# Patient Record
Sex: Female | Born: 1975 | Race: Black or African American | Hispanic: No | Marital: Single | Smoking: Never smoker
Health system: Southern US, Community
[De-identification: ages and names within clinical notes are randomized; demographics above are authoritative.]

## PROBLEM LIST (undated history)

## (undated) DIAGNOSIS — Z9889 Other specified postprocedural states: Secondary | ICD-10-CM

## (undated) DIAGNOSIS — I10 Essential (primary) hypertension: Secondary | ICD-10-CM

## (undated) DIAGNOSIS — M1990 Unspecified osteoarthritis, unspecified site: Secondary | ICD-10-CM

## (undated) DIAGNOSIS — E785 Hyperlipidemia, unspecified: Secondary | ICD-10-CM

## (undated) DIAGNOSIS — E119 Type 2 diabetes mellitus without complications: Secondary | ICD-10-CM

## (undated) DIAGNOSIS — I4729 Other ventricular tachycardia: Secondary | ICD-10-CM

## (undated) DIAGNOSIS — I493 Ventricular premature depolarization: Secondary | ICD-10-CM

## (undated) DIAGNOSIS — I5030 Unspecified diastolic (congestive) heart failure: Secondary | ICD-10-CM

## (undated) DIAGNOSIS — R51 Headache: Secondary | ICD-10-CM

## (undated) DIAGNOSIS — R0609 Other forms of dyspnea: Secondary | ICD-10-CM

## (undated) DIAGNOSIS — R0600 Dyspnea, unspecified: Secondary | ICD-10-CM

## (undated) DIAGNOSIS — I509 Heart failure, unspecified: Secondary | ICD-10-CM

## (undated) DIAGNOSIS — R011 Cardiac murmur, unspecified: Secondary | ICD-10-CM

## (undated) HISTORY — DX: Other forms of dyspnea: R06.09

## (undated) HISTORY — DX: Unspecified diastolic (congestive) heart failure: I50.30

## (undated) HISTORY — DX: Type 2 diabetes mellitus without complications: E11.9

## (undated) HISTORY — DX: Morbid (severe) obesity due to excess calories: E66.01

## (undated) HISTORY — PX: TUBAL LIGATION: SHX77

## (undated) HISTORY — PX: OTHER SURGICAL HISTORY: SHX169

## (undated) HISTORY — DX: Other ventricular tachycardia: I47.29

## (undated) HISTORY — DX: Heart failure, unspecified: I50.9

## (undated) HISTORY — DX: Other specified postprocedural states: Z98.890

## (undated) HISTORY — DX: Dyspnea, unspecified: R06.00

## (undated) HISTORY — DX: Ventricular premature depolarization: I49.3

---

## 2002-07-02 ENCOUNTER — Other Ambulatory Visit: Admission: RE | Admit: 2002-07-02 | Discharge: 2002-07-02 | Payer: Self-pay | Admitting: Obstetrics and Gynecology

## 2003-10-01 ENCOUNTER — Other Ambulatory Visit: Admission: RE | Admit: 2003-10-01 | Discharge: 2003-10-01 | Payer: Self-pay | Admitting: Obstetrics and Gynecology

## 2004-06-08 ENCOUNTER — Observation Stay (HOSPITAL_COMMUNITY): Admission: RE | Admit: 2004-06-08 | Discharge: 2004-06-08 | Payer: Self-pay | Admitting: Obstetrics and Gynecology

## 2004-07-13 ENCOUNTER — Encounter: Admission: RE | Admit: 2004-07-13 | Discharge: 2004-07-13 | Payer: Self-pay | Admitting: Nephrology

## 2004-07-25 ENCOUNTER — Inpatient Hospital Stay (HOSPITAL_COMMUNITY): Admission: AD | Admit: 2004-07-25 | Discharge: 2004-07-25 | Payer: Self-pay | Admitting: *Deleted

## 2004-10-21 ENCOUNTER — Inpatient Hospital Stay (HOSPITAL_COMMUNITY): Admission: AD | Admit: 2004-10-21 | Discharge: 2004-10-26 | Payer: Self-pay | Admitting: Obstetrics and Gynecology

## 2004-11-09 ENCOUNTER — Inpatient Hospital Stay (HOSPITAL_COMMUNITY): Admission: AD | Admit: 2004-11-09 | Discharge: 2004-11-09 | Payer: Self-pay | Admitting: *Deleted

## 2004-11-15 ENCOUNTER — Inpatient Hospital Stay (HOSPITAL_COMMUNITY): Admission: AD | Admit: 2004-11-15 | Discharge: 2004-11-15 | Payer: Self-pay | Admitting: Obstetrics and Gynecology

## 2004-11-24 ENCOUNTER — Inpatient Hospital Stay (HOSPITAL_COMMUNITY): Admission: AD | Admit: 2004-11-24 | Discharge: 2004-11-26 | Payer: Self-pay | Admitting: Obstetrics and Gynecology

## 2009-02-22 ENCOUNTER — Ambulatory Visit: Payer: Self-pay | Admitting: Internal Medicine

## 2009-03-27 ENCOUNTER — Emergency Department: Payer: Self-pay | Admitting: Emergency Medicine

## 2010-07-13 ENCOUNTER — Ambulatory Visit: Payer: Self-pay | Admitting: Gastroenterology

## 2010-07-15 LAB — PATHOLOGY REPORT

## 2011-04-27 ENCOUNTER — Other Ambulatory Visit: Payer: Self-pay

## 2011-04-28 ENCOUNTER — Other Ambulatory Visit: Payer: Self-pay | Admitting: Obstetrics and Gynecology

## 2011-04-28 ENCOUNTER — Other Ambulatory Visit (HOSPITAL_COMMUNITY): Payer: Self-pay | Admitting: Obstetrics and Gynecology

## 2011-04-28 DIAGNOSIS — Z3689 Encounter for other specified antenatal screening: Secondary | ICD-10-CM

## 2011-04-28 DIAGNOSIS — O09529 Supervision of elderly multigravida, unspecified trimester: Secondary | ICD-10-CM

## 2011-04-28 DIAGNOSIS — Z3682 Encounter for antenatal screening for nuchal translucency: Secondary | ICD-10-CM

## 2011-05-08 ENCOUNTER — Other Ambulatory Visit: Payer: Self-pay | Admitting: Obstetrics and Gynecology

## 2011-05-22 ENCOUNTER — Encounter (HOSPITAL_COMMUNITY): Payer: Self-pay

## 2011-05-22 ENCOUNTER — Ambulatory Visit (HOSPITAL_COMMUNITY)
Admission: RE | Admit: 2011-05-22 | Discharge: 2011-05-22 | Disposition: A | Discharge status: Home / Self Care | Payer: BC Managed Care – PPO | Source: Ambulatory Visit | Attending: Obstetrics and Gynecology | Admitting: Obstetrics and Gynecology

## 2011-05-22 ENCOUNTER — Encounter (HOSPITAL_COMMUNITY)
Admission: RE | Admit: 2011-05-22 | Discharge: 2011-05-22 | Disposition: A | Discharge status: Home / Self Care | Payer: BC Managed Care – PPO | Source: Ambulatory Visit | Attending: Obstetrics & Gynecology | Admitting: Obstetrics & Gynecology

## 2011-05-22 ENCOUNTER — Other Ambulatory Visit (HOSPITAL_COMMUNITY): Payer: BC Managed Care – PPO

## 2011-05-22 DIAGNOSIS — O3510X0 Maternal care for (suspected) chromosomal abnormality in fetus, unspecified, not applicable or unspecified: Secondary | ICD-10-CM | POA: Insufficient documentation

## 2011-05-22 DIAGNOSIS — Z3682 Encounter for antenatal screening for nuchal translucency: Secondary | ICD-10-CM

## 2011-05-22 DIAGNOSIS — O09529 Supervision of elderly multigravida, unspecified trimester: Secondary | ICD-10-CM | POA: Insufficient documentation

## 2011-05-22 DIAGNOSIS — O10019 Pre-existing essential hypertension complicating pregnancy, unspecified trimester: Secondary | ICD-10-CM | POA: Insufficient documentation

## 2011-05-22 DIAGNOSIS — O351XX0 Maternal care for (suspected) chromosomal abnormality in fetus, not applicable or unspecified: Secondary | ICD-10-CM | POA: Insufficient documentation

## 2011-05-22 DIAGNOSIS — E669 Obesity, unspecified: Secondary | ICD-10-CM | POA: Insufficient documentation

## 2011-05-22 DIAGNOSIS — O99210 Obesity complicating pregnancy, unspecified trimester: Secondary | ICD-10-CM | POA: Insufficient documentation

## 2011-05-22 HISTORY — DX: Hyperlipidemia, unspecified: E78.5

## 2011-05-22 HISTORY — DX: Essential (primary) hypertension: I10

## 2011-05-22 HISTORY — DX: Headache: R51

## 2011-05-22 HISTORY — DX: Cardiac murmur, unspecified: R01.1

## 2011-05-22 HISTORY — DX: Unspecified osteoarthritis, unspecified site: M19.90

## 2011-05-22 LAB — BASIC METABOLIC PANEL
BUN: 9 mg/dL (ref 6–23)
CO2: 25 mEq/L (ref 19–32)
Calcium: 9.1 mg/dL (ref 8.4–10.5)
Chloride: 106 mEq/L (ref 96–112)
Creatinine, Ser: 0.69 mg/dL (ref 0.50–1.10)
GFR calc Af Amer: >90 mL/min (ref >90)
GFR calc non Af Amer: >90 mL/min (ref >90)
Glucose, Bld: 109 mg/dL — ABNORMAL HIGH (ref 70–99)
Potassium: 3.1 mEq/L — ABNORMAL LOW (ref 3.5–5.1)
Sodium: 138 mEq/L (ref 135–145)

## 2011-05-22 LAB — CBC
HCT: 35.1 % — ABNORMAL LOW (ref 36.0–46.0)
Hemoglobin: 11.5 g/dL — ABNORMAL LOW (ref 12.0–15.0)
MCH: 28 pg (ref 26.0–34.0)
MCHC: 32.8 g/dL (ref 30.0–36.0)
MCV: 85.6 fL (ref 78.0–100.0)
Platelets: 323 10*3/uL (ref 150–400)
RBC: 4.1 MIL/uL (ref 3.87–5.11)
RDW: 15.2 % (ref 11.5–15.5)
WBC: 10.5 10*3/uL (ref 4.0–10.5)

## 2011-05-22 NOTE — Pre-Procedure Instructions (Signed)
Reviewed Patient's BP today with Dr Arby Barrette.  Reminded patient to take Labetalol as prescribed now thru surgical date.  Patient instructed to take Labetalol and Claritin DOS with small sip of water.  Patient verbalized understanding.

## 2011-05-22 NOTE — Progress Notes (Signed)
Genetic Counseling  High-Risk Gestation Note  Appointment Date:  05/22/2011 Referred By: Philip Aspen, DO Date of Birth:  11-02-1975 Partner:  Heather Norman Attending: Particia Nearing, MD   Ms. Heather Norman and her partner, Mr. Heather Norman, were seen for genetic counseling because of a maternal age of 35 y.o. at delivery.      They were counseled regarding maternal age and the association with risk for chromosome conditions due to nondisjunction with aging of the ova.   We reviewed chromosomes, nondisjunction, and the associated 1 in 31 risk for fetal aneuploidy in the first trimester related to a maternal age of 57 at delivery.  They were counseled that the risk for aneuploidy decreases as gestational age increases, accounting for those pregnancies which spontaneously abort.  We specifically discussed Down syndrome (trisomy 54), trisomies 48 and 3, and sex chromosome aneuploidies (47,XXX and 47,XXY) including the common features and prognoses of each.   We reviewed available screening and diagnostic options.  Regarding screening tests, we discussed the options of First screen, Quad screen and ultrasound.  They understand that screening tests are used to modify a patient's a priori risk for aneuploidy, typically based on age.  This estimate provides a pregnancy specific risk assessment.  We also reviewed the availability of diagnostic options including CVS and amniocentesis.  A risk of 1 in 100 was given for CVS and 1 in 200-300 was given for amniocentesis, the primary complication being spontaneous pregnancy loss. We discussed the risks, limitations, and benefits of each.  After reviewing these options, Ms. Heather Norman elected to have First trimester screening, but declined CVS and amniocentesis at this time.  Ultrasound was performed at the time of today's visit. Complete ultrasound results are reported separately. She wishes to pursue these options to help ascertain her pregnancy  specific risks for aneuploidy and will possibly consider amniocentesis pending the results of this testing.  She understands that ultrasound cannot rule out all birth defects or genetic syndromes. The patient was advised of this limitation and states she still does not want diagnostic testing at this time.  However, they were counseled that 50-80% of fetuses with Down syndrome and up to 90% of fetuses with trisomies 13 and 18, when well visualized, have detectable anomalies or soft markers by ultrasound.   Ms. Heather Norman was provided with written information regarding sickle cell anemia (SCA) including the carrier frequency and incidence in the African-American population, the availability of carrier testing and prenatal diagnosis if indicated.  In addition, we discussed that hemoglobinopathies are routinely screened for as part of the [place] newborn screening panel.  She reported that she was screened in a past pregnancy and was negative for sickle cell trait, thus declining hemoglobin electrophoresis today.   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 She reported that it was attributed to her mother having measles during her pregnancy with her. We discussed that hearing loss can be genetic, environmental, or multifactorial. Prenatal exposures and infections can be associated with hearing loss. If this were the cause of Heather Norman's hearing loss, then recurrence risk for her children would not be increased. However, if it were due to a different underlying cause, recurrence risk could be increased depending on the cause. We reviewed that hearing is assessed as part of newborn screening for infants in [place]. The family history was otherwise noncontributory for birth defects, mental retardation, and known genetic  conditions. Without further information regarding the provided family history, an accurate genetic risk cannot be calculated.  Further genetic counseling is warranted if more information is obtained.  Heather Norman denied exposure to environmental toxins. She denied the use of alcohol, tobacco or street drugs. She denied significant viral illnesses during the course of her pregnancy. Her medical and surgical histories were contributory for hypertension, for which she is managed by her OB and treated with labetalol.    I counseled this couple regarding the above risks and available options.  The approximate face-to-face time with the genetic counselor was 20 minutes.  Clydie Braun , MS, [facility] 05/22/2011

## 2011-05-22 NOTE — Patient Instructions (Signed)
   Your procedure is scheduled on:  Thursday, November 1st  Enter through the [employer] [facility] at:  6:00am Pick up the phone at the desk and dial [phone] and inform us of your arrival.  Please call this number if you have any problems the morning of surgery: [phone]  Remember: Do not eat food after midnight:  Wednesday Do not drink clear liquids after:  Wednesday Take these medicines the morning of surgery with a SIP OF WATER:  Labetalol and claritin  Do not wear jewelry, make-up, or FINGER nail polish Do not wear lotions, powders, or perfumes.  You may wear deodorant. Do not shave 48 hours prior to surgery. Do not bring valuables to the hospital.  Patients discharged on the day of surgery will not be allowed to drive home.   Name and phone number of your driver:  sister  Nayara Taplin  cell [phone]  Remember to use your hibiclens as instructed.Please shower with 1/2 bottle the evening before your surgery and the other 1/2 bottle the morning of surgery.

## 2011-05-29 ENCOUNTER — Other Ambulatory Visit: Payer: Self-pay

## 2011-05-29 ENCOUNTER — Inpatient Hospital Stay (HOSPITAL_COMMUNITY)
Admission: AD | Admit: 2011-05-29 | Discharge: 2011-05-29 | Disposition: A | Discharge status: Home / Self Care | Payer: BC Managed Care – PPO | Source: Ambulatory Visit | Attending: Obstetrics and Gynecology | Admitting: Obstetrics and Gynecology

## 2011-05-29 ENCOUNTER — Inpatient Hospital Stay (HOSPITAL_COMMUNITY): Payer: BC Managed Care – PPO

## 2011-05-29 DIAGNOSIS — O99891 Other specified diseases and conditions complicating pregnancy: Secondary | ICD-10-CM | POA: Insufficient documentation

## 2011-05-29 DIAGNOSIS — O9989 Other specified diseases and conditions complicating pregnancy, childbirth and the puerperium: Secondary | ICD-10-CM

## 2011-05-29 DIAGNOSIS — N949 Unspecified condition associated with female genital organs and menstrual cycle: Secondary | ICD-10-CM | POA: Insufficient documentation

## 2011-05-29 DIAGNOSIS — O09299 Supervision of pregnancy with other poor reproductive or obstetric history, unspecified trimester: Secondary | ICD-10-CM

## 2011-05-29 DIAGNOSIS — O26899 Other specified pregnancy related conditions, unspecified trimester: Secondary | ICD-10-CM

## 2011-05-29 DIAGNOSIS — R109 Unspecified abdominal pain: Secondary | ICD-10-CM | POA: Insufficient documentation

## 2011-05-29 LAB — URINALYSIS, ROUTINE W REFLEX MICROSCOPIC
Bilirubin Urine: NEGATIVE
Glucose, UA: NEGATIVE mg/dL
Ketones, ur: 40 mg/dL — AB
Nitrite: NEGATIVE
Protein, ur: NEGATIVE mg/dL
Specific Gravity, Urine: 1.02 (ref 1.005–1.030)
Urobilinogen, UA: 0.2 mg/dL (ref 0.0–1.0)
pH: 7 (ref 5.0–8.0)

## 2011-05-29 LAB — URINE MICROSCOPIC-ADD ON

## 2011-05-29 NOTE — Progress Notes (Signed)
Patient states she started having lower abdominal cramping this am. No bleeding or vaginal discharge. Urinary frequency today. Scheduled for cerclage on 11-1 here at [facility] by Dr. Claiborne Billings.

## 2011-05-29 NOTE — Progress Notes (Signed)
Pt states she is having  Intermittent lower abdominal cramping that started this am. No vaginal bleeding or discharge. Pt states she is to have a cerclage on Thursday this week, b/c of hx of incompetent cervix with her other pregnancies.

## 2011-05-29 NOTE — ED Provider Notes (Signed)
History     Chief Complaint  Patient presents with  . Abdominal Pain   HPITamieka S Tsui 35 y.o. [redacted]w[redacted]d patient of Dr. Delray Alt presents with abdominal pain that began at 9 this am.   G5 P1 1 2 2.  Hx of incompetent cervix, deliveries at 16wks, 28wks, 34wks.  Has cerclage placement scheduled for Thursday.  Rates pain as 7-8/10, intermittent, dull.  Denies vaginal bleeding or vaginal discharge.  Last intercourse 2 months ago. Denies straining, injury.  Is concerned because same sxs as the pregnancy that delivered 6 weeks early.        Past Medical History  Diagnosis Date  . Hypertension   . Heart murmur   . Hyperlipidemia     no meds since pregnancy  . Shortness of breath     with exercise  . Headache     otc med prn  . Arthritis     kness/hands - no meds  . Obesity     Past Surgical History  Procedure Date  . Cervical cerclage     x 2  . Svd     x 2  . Endosocpy   . Tab     x 1    No family history on file.  History  Substance Use Topics  . Smoking status: Never Smoker   . Smokeless tobacco: Never Used  . Alcohol Use: No    Allergies:  Allergies  Allergen Reactions  . Penicillins Anaphylaxis    Prescriptions prior to admission  Medication Sig Dispense Refill  . acetaminophen (TYLENOL) 500 MG tablet Take 1,000 mg by mouth daily as needed. Pain/headache      . cetirizine (ZYRTEC) 10 MG tablet Take 10 mg by mouth daily.        Marland Kitchen labetalol (NORMODYNE) 200 MG tablet Take 300 mg by mouth 2 (two) times daily.       . prenatal vitamin w/FE, FA (PRENATAL 1 + 1) 27-1 MG TABS Take 1 tablet by mouth daily.        . sodium chloride (OCEAN) 0.65 % nasal spray Place 1 spray into the nose as needed. congestion       . loratadine (CLARITIN) 10 MG tablet Take 10 mg by mouth daily.          Review of Systems  Gastrointestinal: Positive for abdominal pain. Negative for nausea and vomiting.  Genitourinary:       Lower back, pelvic and buttock pain. Neg for vaginal  bleeding and discharge.  Neurological: Positive for headaches ("in my eyes" forgot to take allergy medication today.).   Physical Exam   Blood pressure 153/92, pulse 77, temperature 99 F (37.2 C), temperature source Oral, resp. rate 20, height 5\' 5"  (1.651 m), weight 274 lb 6.4 oz (124.467 kg), last menstrual period 02/25/2011, SpO2 99.00%.  Physical Exam  Constitutional: She is oriented to person, place, and time. She appears well-developed and well-nourished. No distress.  HENT:  Head: Normocephalic.  Neck: Normal range of motion.  Respiratory: Effort normal.  GI: Soft. She exhibits no distension and no mass. There is no tenderness. There is no rebound and no guarding.  Genitourinary: Uterus is enlarged (13 week size nontender). Cervix exhibits no motion tenderness, no discharge and no friability. Right adnexum displays no tenderness. Left adnexum displays no tenderness. No bleeding around the vagina. Vaginal discharge (small amount of white creamy discharge without odor) found.       Cervix--external os fingertip dilated.  Internal os is closed.  Neurological: She is alert and oriented to person, place, and time.  Skin: Skin is warm and dry.    ULTRASOUND REPORT:  Single live IUP in cephalic presentation.  [redacted]w[redacted]d.  Cervix long/closed by TV technique.  TOCO- contractions not seen.  MAU Course  Procedures  MDM 14:00  Reported MSE to Dr. Claiborne Billings.  She gave orders for NP to examine, monitor for contractions, U/S for cervical length, FHTs.    15:46 Called to Report physical exam, toco, and U/S findings to Dr. Claiborne Billings, went to voice mail, left message for her to return call.   She returned by call at 15:58 and results given.  Reviewed BP.  Dr Claiborne Billings wants to increase her Labetalol to 300mg  tid.   Marland Kitchen  Urine sent for culture.  Reported that patient is concerned about working, stands all day as Associate Professor.  Per Dr. Claiborne Billings may come by office to get work excuse.  Assessment and Plan    A:  Pelvic pain at [redacted]w[redacted]d gestation      Hx of cervical incompetence     P:  Keep plans for cerclage for Thursday.      Call Dr. Claiborne Billings before that time if your pelvic pain worsens, you have vaginal bleeding of leaking of fluid.  Patient instructed to take Labetalol 300mg  tid.    ,EVE M 05/29/2011, 1:55 PM     Matt Holmes, NP 05/29/11 1610

## 2011-06-01 ENCOUNTER — Encounter (HOSPITAL_COMMUNITY): Payer: Self-pay | Admitting: *Deleted

## 2011-06-01 ENCOUNTER — Encounter (HOSPITAL_COMMUNITY): Payer: Self-pay | Admitting: Anesthesiology

## 2011-06-01 ENCOUNTER — Encounter (HOSPITAL_COMMUNITY)
Admission: RE | Disposition: A | Discharge status: Home / Self Care | Payer: Self-pay | Source: Ambulatory Visit | Attending: Obstetrics and Gynecology

## 2011-06-01 ENCOUNTER — Ambulatory Visit (HOSPITAL_COMMUNITY): Payer: BC Managed Care – PPO | Admitting: Anesthesiology

## 2011-06-01 ENCOUNTER — Ambulatory Visit (HOSPITAL_COMMUNITY)
Admission: RE | Admit: 2011-06-01 | Discharge: 2011-06-01 | Disposition: A | Discharge status: Home / Self Care | Payer: BC Managed Care – PPO | Source: Ambulatory Visit | Attending: Obstetrics and Gynecology | Admitting: Obstetrics and Gynecology

## 2011-06-01 DIAGNOSIS — O3430 Maternal care for cervical incompetence, unspecified trimester: Secondary | ICD-10-CM | POA: Insufficient documentation

## 2011-06-01 HISTORY — PX: CERVICAL CERCLAGE: SHX1329

## 2011-06-01 SURGERY — CERCLAGE, CERVIX, VAGINAL APPROACH
Anesthesia: Spinal | Site: Vagina | Wound class: Clean Contaminated

## 2011-06-01 MED ORDER — BUPIVACAINE IN DEXTROSE 0.75-8.25 % IT SOLN
INTRATHECAL | Status: DC | PRN
  Administered 2011-06-01: 1.2 mL via INTRATHECAL

## 2011-06-01 MED ORDER — LACTATED RINGERS IV SOLN
INTRAVENOUS | Status: DC
  Administered 2011-06-01: 07:00:00 via INTRAVENOUS
  Administered 2011-06-01: 125 mL via INTRAVENOUS

## 2011-06-01 MED ORDER — KETOROLAC TROMETHAMINE 30 MG/ML IJ SOLN
INTRAMUSCULAR | Status: AC
  Filled 2011-06-01: qty 1

## 2011-06-01 MED ORDER — KETOROLAC TROMETHAMINE 30 MG/ML IJ SOLN
15.0000 mg | Freq: Once | INTRAMUSCULAR | Status: AC | PRN
  Administered 2011-06-01: 30 mg via INTRAVENOUS

## 2011-06-01 MED ORDER — LACTATED RINGERS IV SOLN
INTRAVENOUS | Status: DC | PRN
  Administered 2011-06-01: 07:00:00 via INTRAVENOUS

## 2011-06-01 SURGICAL SUPPLY — 18 items
CATH FOLEY 2WAY SLVR 30CC 16FR (CATHETERS) IMPLANT
CATH ROBINSON RED A/P 16FR (CATHETERS) ×2 IMPLANT
CLOTH BEACON ORANGE TIMEOUT ST (SAFETY) ×2 IMPLANT
COUNTER NEEDLE 1200 MAGNETIC (NEEDLE) IMPLANT
GLOVE BIO SURGEON STRL SZ 6.5 (GLOVE) ×2 IMPLANT
GLOVE BIOGEL PI IND STRL 7.0 (GLOVE) ×2 IMPLANT
GLOVE BIOGEL PI INDICATOR 7.0 (GLOVE) ×2
GOWN PREVENTION PLUS LG XLONG (DISPOSABLE) ×4 IMPLANT
NEEDLE MAYO .5 CIRCLE (NEEDLE) ×2 IMPLANT
NS IRRIG 1000ML POUR BTL (IV SOLUTION) ×2 IMPLANT
PACK VAGINAL MINOR WOMEN LF (CUSTOM PROCEDURE TRAY) ×2 IMPLANT
PAD PREP 24X48 CUFFED NSTRL (MISCELLANEOUS) ×2 IMPLANT
SUT MERSILENE 5MM BP 1 12 (SUTURE) ×4 IMPLANT
SYR 30ML LL (SYRINGE) IMPLANT
TOWEL OR 17X24 6PK STRL BLUE (TOWEL DISPOSABLE) ×4 IMPLANT
TUBING NON-CON 1/4 X 20 CONN (TUBING) IMPLANT
WATER STERILE IRR 1000ML POUR (IV SOLUTION) ×2 IMPLANT
YANKAUER SUCT BULB TIP NO VENT (SUCTIONS) IMPLANT

## 2011-06-01 NOTE — Transfer of Care (Signed)
Immediate Anesthesia Transfer of Care Note  Patient: Heather Norman  Procedure(s) Performed:  CERCLAGE CERVICAL - REQUESTING PORTABLE ULTARSOUND AT BEDSIDE PT'S EDC:12/02/2011  Patient Location: PACU  Anesthesia Type: Spinal  Level of Consciousness: awake, alert  and oriented  Airway & Oxygen Therapy: Patient Spontanous Breathing and Patient connected to nasal cannula oxygen  Post-op Assessment: Report given to PACU RN and Post -op Vital signs reviewed and stable  Post vital signs: Reviewed and stable  Complications: No apparent anesthesia complications

## 2011-06-01 NOTE — Anesthesia Procedure Notes (Addendum)
Spinal Block  Patient location during procedure: OR Start time: 06/01/2011 7:33 AM End time: 06/01/2011 7:37 AM Staffing Anesthesiologist: Sandrea Hughs Performed by: anesthesiologist  Preanesthetic Checklist Completed: patient identified, site marked, surgical consent, pre-op evaluation, timeout performed, IV checked, risks and benefits discussed and monitors and equipment checked Spinal Block Patient position: sitting Prep: DuraPrep Patient monitoring: heart rate, cardiac monitor, continuous pulse ox and blood pressure Approach: midline Location: L3-4 Injection technique: single-shot Needle Needle type: Sprotte  Needle gauge: 24 G Needle length: 9 cm Needle insertion depth: 8 cm Assessment Sensory level: T10  Epidural   Spinal Block   Performed by: Sandrea Hughs

## 2011-06-01 NOTE — H&P (Signed)
35 y.o. [id number] presents for scheduled cerclage.  Pt had painless dilation and an emergency cerclage, followed by placement of a prophylactic cerclage @13  weeks for following pregnancy.  Pt denies any complications with either of those vaginal deliveries.  Pt denies any current complaints including CP/SOB/abd pain/fever/chills/calf pain.  She denies any problems with urination or bowel movements.   Past Medical History  Diagnosis Date  . Hypertension   . Heart murmur   . Hyperlipidemia     no meds since pregnancy  . Shortness of breath     with exercise  . Headache     otc med prn  . Arthritis     kness/hands - no meds  . Obesity    Past Surgical History  Procedure Date  . Cervical cerclage     x 2  . Svd     x 2  . Endosocpy   . Tab     x 1    History   Social History  . Marital Status: Single    Spouse Name: N/A    Number of Children: N/A  . Years of Education: N/A   Occupational History  . Not on file.   Social History Main Topics  . Smoking status: Never Smoker   . Smokeless tobacco: Never Used  . Alcohol Use: No  . Drug Use: No  . Sexually Active: Yes     pregnant   Other Topics Concern  . Not on file   Social History Narrative  . No narrative on file    No current facility-administered medications on file prior to encounter.   No current outpatient prescriptions on file prior to encounter.    Allergies  Allergen Reactions  . Penicillins Anaphylaxis    Lungs: clear to ascultation Cor:  RRR Abdomen:  soft, nontender, nondistended. Ex:  no cords, erythema Pelvic:  deferred  A:  35 yo at 13+ wks with [facility] for prophylactic cerclage 1. Admit for same day procedure 2. IV/IVF 3. No VTE or abx nec 4. Pt is Rh+ - no rhogam required 5. FHT pre and post procedure   All risks, benefits and alternatives d/w patient and she desires to proceed with a cerclage .  Philip Aspen

## 2011-06-01 NOTE — Anesthesia Postprocedure Evaluation (Signed)
Anesthesia Post Note  Patient: Heather Norman  Procedure(s) Performed:  CERCLAGE CERVICAL - REQUESTING PORTABLE ULTARSOUND AT BEDSIDE PT'S EDC:12/02/2011  Anesthesia type: Spinal  Patient location: PACU  Post pain: Pain level controlled  Post assessment: Post-op Vital signs reviewed  Last Vitals:  Filed Vitals:   06/01/11 0900  BP: 155/94  Pulse: 78  Temp:   Resp: 27    Post vital signs: Reviewed  Level of consciousness: awake  Complications: No apparent anesthesia complications

## 2011-06-01 NOTE — Discharge Summary (Signed)
Pt was taken to OR for scheduled cerclage placemen, after proper consent obtained.  FHT were obtained by [place] pre and post- operatively.  Mersilene tape used.  Fibrotic tissue encountered due to prior 2 cerclages.  No complications occurred.  Minimal bleeding occurred which resolved by end of case.  Verification of proper placement noted digitally.  Pt tolerated well.

## 2011-06-01 NOTE — Anesthesia Preprocedure Evaluation (Signed)
Anesthesia Evaluation  Patient identified by MRN, date of birth, ID band Patient awake  General Assessment Comment  Reviewed: Allergy & Precautions, H&P , NPO status , Patient's Chart, lab work & pertinent test results, reviewed documented beta blocker date and time   History of Anesthesia Complications Negative for: history of anesthetic complications  Airway Mallampati: III TM Distance: >3 FB Neck ROM: full    Dental  (+)    Pulmonary  clear to auscultation        Cardiovascular hypertension, On Home Beta Blockers regular Normal    Neuro/Psych  Headaches (daily headaches - relates to sinuses), Negative Psych ROS   GI/Hepatic negative GI ROS Neg liver ROS    Endo/Other  Negative Endocrine ROSDiabetes mellitus- (failed 1 hr glucose test - doing 3 hr tomorrow, had GDM in 1st pregnancy)Morbid obesity  Renal/GU negative Renal ROS  Genitourinary negative   Musculoskeletal   Abdominal   Peds  Hematology negative hematology ROS (+)   Anesthesia Other Findings   Reproductive/Obstetrics (+) Pregnancy (13 weeks, h/o incompetent cervix)                           Anesthesia Physical Anesthesia Plan  ASA: II  Anesthesia Plan: Spinal   Post-op Pain Management:    Induction:   Airway Management Planned:   Additional Equipment:   Intra-op Plan:   Post-operative Plan:   Informed Consent: I have reviewed the patients History and Physical, chart, labs and discussed the procedure including the risks, benefits and alternatives for the proposed anesthesia with the patient or authorized representative who has indicated his/her understanding and acceptance.   Dental Advisory Given  Plan Discussed with: Surgeon and CRNA  Anesthesia Plan Comments:         Anesthesia Quick Evaluation

## 2011-06-03 NOTE — Op Note (Signed)
Heather Norman, Heather Norman              ACCOUNT NO.:  [id number]  MEDICAL RECORD NO.:  [id number]  LOCATION:  [facility]                          FACILITY:  [facility]  PHYSICIAN:  Philip Aspen, DO    DATE OF BIRTH:  1976/03/28  DATE OF PROCEDURE:  06/02/2011 DATE OF DISCHARGE:  06/01/2011                              OPERATIVE REPORT   CSN:  None.  PREOPERATIVE DIAGNOSES: 1. History of cervical insufficiency, 13 plus week. 2. Intrauterine pregnancy.  POSTOPERATIVE DIAGNOSES: 1. History of cervical insufficiency, 13 plus week. 2. Intrauterine pregnancy.  PROCEDURE:  Cervical cerclage.  SURGEON:  Philip Aspen, DO.  ASSISTANT:  Dr. Emelda Fear of faculty practice.  ESTIMATED BLOOD LOSS,URINE OUTPUT AND FLUIDS:  Please see anesthesia report.  FINDINGS:  External cervix dilated approximately 1 cm, approximately 2 cm of cervical length anteriorly and approximately 3 cm posteriorly. Fetal heart tones normal by ultrasound per and postoperatively.  DESCRIPTION OF PROCEDURE:  The patient was taken to the OR where spinal anesthesia was administered and found to be adequate.  She was prepped and draped in normal sterile fashion in dorsal lithotomy position.  A weighted speculum was placed in the posterior aspect of the vaginal and deeper retractors were used to visualize the cervix.  Ring forceps was used to grasp the anterior cervix and Mersilene suture was placed starting at 12 o'clock and extending clockwise in pursestring manner avoiding 6 and 9 o'clock positions and ending again at 12 o'clock. Mersilene suture was tied after ensuring proper placement by digital exam.  Some bleeding was noted at the cervix which was swabbed away and hemostasis was noted at the exclusion of the case.  The patient tolerated the procedure well.  Marland Kitchen  Sponge, lap, and needle counts were corrected x2.  The patient was taken to the recovery room in stable condition.           ______________________________ Philip Aspen, DO     [place]/MEDQ  D:  06/02/2011  T:  06/03/2011  Job:  [id number]

## 2011-06-05 ENCOUNTER — Encounter (HOSPITAL_COMMUNITY): Payer: Self-pay | Admitting: Obstetrics and Gynecology

## 2011-06-06 ENCOUNTER — Other Ambulatory Visit: Payer: Self-pay

## 2011-06-06 ENCOUNTER — Ambulatory Visit (HOSPITAL_COMMUNITY)
Admission: RE | Admit: 2011-06-06 | Discharge: 2011-06-06 | Disposition: A | Discharge status: Home / Self Care | Payer: BC Managed Care – PPO | Source: Ambulatory Visit | Attending: Obstetrics and Gynecology | Admitting: Obstetrics and Gynecology

## 2011-06-06 DIAGNOSIS — R9431 Abnormal electrocardiogram [ECG] [EKG]: Secondary | ICD-10-CM | POA: Insufficient documentation

## 2011-06-06 DIAGNOSIS — R030 Elevated blood-pressure reading, without diagnosis of hypertension: Secondary | ICD-10-CM | POA: Insufficient documentation

## 2011-06-30 ENCOUNTER — Other Ambulatory Visit (HOSPITAL_COMMUNITY): Payer: Self-pay | Admitting: Obstetrics and Gynecology

## 2011-06-30 ENCOUNTER — Ambulatory Visit (HOSPITAL_COMMUNITY)
Admission: RE | Admit: 2011-06-30 | Discharge: 2011-06-30 | Disposition: A | Discharge status: Home / Self Care | Payer: BC Managed Care – PPO | Source: Ambulatory Visit | Attending: Obstetrics and Gynecology | Admitting: Obstetrics and Gynecology

## 2011-06-30 DIAGNOSIS — Z8751 Personal history of pre-term labor: Secondary | ICD-10-CM | POA: Insufficient documentation

## 2011-06-30 DIAGNOSIS — O09529 Supervision of elderly multigravida, unspecified trimester: Secondary | ICD-10-CM | POA: Insufficient documentation

## 2011-06-30 DIAGNOSIS — O10019 Pre-existing essential hypertension complicating pregnancy, unspecified trimester: Secondary | ICD-10-CM | POA: Insufficient documentation

## 2011-06-30 DIAGNOSIS — O358XX0 Maternal care for other (suspected) fetal abnormality and damage, not applicable or unspecified: Secondary | ICD-10-CM | POA: Insufficient documentation

## 2011-06-30 DIAGNOSIS — O3430 Maternal care for cervical incompetence, unspecified trimester: Secondary | ICD-10-CM | POA: Insufficient documentation

## 2011-06-30 DIAGNOSIS — O09299 Supervision of pregnancy with other poor reproductive or obstetric history, unspecified trimester: Secondary | ICD-10-CM | POA: Insufficient documentation

## 2011-06-30 DIAGNOSIS — Z3689 Encounter for other specified antenatal screening: Secondary | ICD-10-CM

## 2011-06-30 DIAGNOSIS — E669 Obesity, unspecified: Secondary | ICD-10-CM | POA: Insufficient documentation

## 2011-07-30 ENCOUNTER — Encounter (HOSPITAL_COMMUNITY): Payer: Self-pay | Admitting: *Deleted

## 2011-07-30 ENCOUNTER — Inpatient Hospital Stay (HOSPITAL_COMMUNITY)
Admission: AD | Admit: 2011-07-30 | Discharge: 2011-07-30 | Disposition: A | Discharge status: Home / Self Care | Payer: BC Managed Care – PPO | Source: Ambulatory Visit | Attending: Obstetrics & Gynecology | Admitting: Obstetrics & Gynecology

## 2011-07-30 DIAGNOSIS — A499 Bacterial infection, unspecified: Secondary | ICD-10-CM

## 2011-07-30 DIAGNOSIS — N390 Urinary tract infection, site not specified: Secondary | ICD-10-CM | POA: Insufficient documentation

## 2011-07-30 DIAGNOSIS — B9689 Other specified bacterial agents as the cause of diseases classified elsewhere: Secondary | ICD-10-CM | POA: Insufficient documentation

## 2011-07-30 DIAGNOSIS — R109 Unspecified abdominal pain: Secondary | ICD-10-CM | POA: Insufficient documentation

## 2011-07-30 DIAGNOSIS — N760 Acute vaginitis: Secondary | ICD-10-CM | POA: Insufficient documentation

## 2011-07-30 LAB — WET PREP, GENITAL
Trich, Wet Prep: NONE SEEN
Yeast Wet Prep HPF POC: NONE SEEN

## 2011-07-30 LAB — URINE MICROSCOPIC-ADD ON

## 2011-07-30 LAB — URINALYSIS, ROUTINE W REFLEX MICROSCOPIC
Bilirubin Urine: NEGATIVE
Glucose, UA: NEGATIVE mg/dL
Ketones, ur: NEGATIVE mg/dL
Protein, ur: 30 mg/dL — AB
pH: 6 (ref 5.0–8.0)

## 2011-07-30 MED ORDER — NITROFURANTOIN MONOHYD MACRO 100 MG PO CAPS: 100.0000 mg | ORAL_CAPSULE | Freq: Two times a day (BID) | ORAL | Status: AC

## 2011-07-30 MED ORDER — METRONIDAZOLE 500 MG PO TABS: 500.0000 mg | ORAL_TABLET | Freq: Two times a day (BID) | ORAL | Status: AC

## 2011-07-30 NOTE — Progress Notes (Signed)
W. Muhammad, CNM at bedside.  Assessment done and poc discussed with pt.   

## 2011-07-30 NOTE — Consult Note (Signed)
Reviewed HPI/Exam/labs>RX for Flagyl and DC home

## 2011-07-30 NOTE — Progress Notes (Signed)
FHT's for approx .

## 2011-07-30 NOTE — ED Provider Notes (Signed)
History     Chief Complaint  Patient presents with  . Abdominal Pain   HPI  Pt presents to [facility] for c/o abdominal pain that started last night. Pain is described as cramping.  Denies vaginal bleeding or leaking of fluid.  +cerclage placed 06/01/11.     Past Medical History  Diagnosis Date  . Hypertension   . Heart murmur   . Hyperlipidemia     no meds since pregnancy  . Shortness of breath     with exercise  . Headache     otc med prn  . Arthritis     kness/hands - no meds  . Obesity     Past Surgical History  Procedure Date  . Cervical cerclage 06/01/2011    Procedure: CERCLAGE CERVICAL;  Surgeon: Philip Aspen, DO;  Location: [facility];  Service: Gynecology;  Laterality: N/A;  REQUESTING PORTABLE ULTARSOUND AT BEDSIDE PT'S EDC:12/02/2011  . Svd     x 2  . Endosocpy   . Tab     x 1  . Cervical cerclage 06/01/2011    Procedure: CERCLAGE CERVICAL;  Surgeon: Philip Aspen, DO;  Location: [facility];  Service: Gynecology;  Laterality: N/A;  REQUESTING PORTABLE ULTARSOUND AT BEDSIDE PT'S EDC:12/02/2011    History reviewed. No pertinent family history.  History  Substance Use Topics  . Smoking status: Never Smoker   . Smokeless tobacco: Never Used  . Alcohol Use: No    Allergies:  Allergies  Allergen Reactions  . Penicillins Anaphylaxis    Prescriptions prior to admission  Medication Sig Dispense Refill  . acetaminophen (TYLENOL) 500 MG tablet Take 1,000 mg by mouth daily as needed. Pain/headache      . cetirizine (ZYRTEC) 10 MG tablet Take 10 mg by mouth daily.        Marland Kitchen labetalol (NORMODYNE) 200 MG tablet Take 300 mg by mouth 3 (three) times daily.       Marland Kitchen NIFEdipine (PROCARDIA XL/ADALAT-CC) 30 MG 24 hr tablet Take 30 mg by mouth daily.        . prenatal vitamin w/FE, FA (PRENATAL 1 + 1) 27-1 MG TABS Take 1 tablet by mouth daily.        . sodium chloride (OCEAN) 0.65 % nasal spray Place 1 spray into the nose as needed. congestion         Review of Systems    Gastrointestinal: Positive for abdominal pain.  All other systems reviewed and are negative.   Physical Exam   Blood pressure 134/78, pulse 89, temperature 99.2 F (37.3 C), temperature source Oral, resp. rate 90, height 5\' 6"  (1.676 m), weight 126.1 kg (278 lb), last menstrual period 02/25/2011, SpO2 98.00%.  Physical Exam  Constitutional: She is oriented to person, place, and time. She appears well-developed and well-nourished.  HENT:  Head: Normocephalic.  Neck: Normal range of motion. Neck supple.  GI: Soft. She exhibits no mass. There is no tenderness. There is no guarding.  Genitourinary: Vaginal discharge: white, creamy.       Cerclage in place; cervix closed and palpates long  Neurological: She is alert and oriented to person, place, and time. She has normal reflexes.  Skin: Skin is warm and dry.   FHR 160  MAU Course  Procedures  Results for orders placed during the hospital encounter of 07/30/11 (from the past 24 hour(s))  URINALYSIS, ROUTINE W REFLEX MICROSCOPIC     Status: Abnormal   Collection Time   07/30/11  8:05 PM  Component Value Range   Color, Urine YELLOW  YELLOW    APPearance CLEAR  CLEAR    Specific Gravity, Urine 1.020  1.005 - 1.030    pH 6.0  5.0 - 8.0    Glucose, UA NEGATIVE  NEGATIVE (mg/dL)   Hgb urine dipstick SMALL (*) NEGATIVE    Bilirubin Urine NEGATIVE  NEGATIVE    Ketones, ur NEGATIVE  NEGATIVE (mg/dL)   Protein, ur 30 (*) NEGATIVE (mg/dL)   Urobilinogen, UA 1.0  0.0 - 1.0 (mg/dL)   Nitrite NEGATIVE  NEGATIVE    Leukocytes, UA SMALL (*) NEGATIVE   URINE MICROSCOPIC-ADD ON     Status: Abnormal   Collection Time   07/30/11  8:05 PM      Component Value Range   Squamous Epithelial / LPF FEW (*) RARE    WBC, UA 3-6  <3 (WBC/hpf)   RBC / HPF 3-6  <3 (RBC/hpf)   Bacteria, UA MANY (*) RARE    Urine-Other CA OXALATE CRYSTALS    WET PREP, GENITAL     Status: Abnormal   Collection Time   07/30/11  9:20 PM      Component Value Range    Yeast, Wet Prep NONE SEEN  NONE SEEN    Trich, Wet Prep NONE SEEN  NONE SEEN    Clue Cells, Wet Prep FEW (*) NONE SEEN    WBC, Wet Prep HPF POC MODERATE (*) NONE SEEN      Assessment and Plan  Bacterial Vaginosis Urinary Tract Infection  Plan: DC home RX Flagyl RX Macrobid Follow-up in office this week.  [facility] 07/30/2011, 9:18 PM

## 2011-07-30 NOTE — Progress Notes (Signed)
Pt presents to [facility] for c/o abdominal pain that started last night.  States her left arm has been hurting also but is not hurting right now.

## 2011-07-30 NOTE — Progress Notes (Signed)
SSE done per CNM.  Cerclage intact.  No bleeding.  Wet prep collected.

## 2011-07-30 NOTE — Progress Notes (Signed)
Pt states she has pain down her left am and abd pain since last night-denies nausea

## 2011-08-01 LAB — URINE CULTURE: Culture  Setup Time: 201212311106

## 2011-08-04 ENCOUNTER — Other Ambulatory Visit (HOSPITAL_COMMUNITY): Payer: Self-pay | Admitting: Obstetrics & Gynecology

## 2011-08-04 DIAGNOSIS — O09529 Supervision of elderly multigravida, unspecified trimester: Secondary | ICD-10-CM

## 2011-08-14 ENCOUNTER — Other Ambulatory Visit: Payer: Self-pay

## 2011-08-17 ENCOUNTER — Ambulatory Visit (HOSPITAL_COMMUNITY)
Admission: RE | Admit: 2011-08-17 | Discharge: 2011-08-17 | Disposition: A | Discharge status: Home / Self Care | Payer: BC Managed Care – PPO | Source: Ambulatory Visit | Attending: Obstetrics & Gynecology | Admitting: Obstetrics & Gynecology

## 2011-08-17 DIAGNOSIS — E669 Obesity, unspecified: Secondary | ICD-10-CM | POA: Insufficient documentation

## 2011-08-17 DIAGNOSIS — O99210 Obesity complicating pregnancy, unspecified trimester: Secondary | ICD-10-CM | POA: Insufficient documentation

## 2011-08-17 DIAGNOSIS — O10019 Pre-existing essential hypertension complicating pregnancy, unspecified trimester: Secondary | ICD-10-CM | POA: Insufficient documentation

## 2011-08-17 DIAGNOSIS — O09529 Supervision of elderly multigravida, unspecified trimester: Secondary | ICD-10-CM | POA: Insufficient documentation

## 2011-08-17 DIAGNOSIS — O09299 Supervision of pregnancy with other poor reproductive or obstetric history, unspecified trimester: Secondary | ICD-10-CM | POA: Insufficient documentation

## 2011-08-17 DIAGNOSIS — Z8751 Personal history of pre-term labor: Secondary | ICD-10-CM | POA: Insufficient documentation

## 2011-09-12 ENCOUNTER — Inpatient Hospital Stay: Payer: Self-pay | Admitting: Internal Medicine

## 2011-09-12 LAB — CK TOTAL AND CKMB (NOT AT ARMC)
CK, Total: 308 U/L — ABNORMAL HIGH (ref 21–215)
CK, Total: 395 U/L — ABNORMAL HIGH (ref 21–215)

## 2011-09-12 LAB — COMPREHENSIVE METABOLIC PANEL
Alkaline Phosphatase: 88 U/L (ref 50–136)
BUN: 8 mg/dL (ref 7–18)
Bilirubin,Total: 0.3 mg/dL (ref 0.2–1.0)
Chloride: 106 mmol/L (ref 98–107)
Co2: 23 mmol/L (ref 21–32)
Creatinine: 0.63 mg/dL (ref 0.60–1.30)
EGFR (African American): >60
EGFR (Non-African Amer.): >60
Osmolality: 281 (ref 275–301)
Potassium: 3.8 mmol/L (ref 3.5–5.1)
SGOT(AST): 23 U/L (ref 15–37)
SGPT (ALT): 24 U/L
Sodium: 142 mmol/L (ref 136–145)
Total Protein: 7 g/dL (ref 6.4–8.2)

## 2011-09-12 LAB — CBC
MCH: 29 pg (ref 26.0–34.0)
MCHC: 33.4 g/dL (ref 32.0–36.0)
MCV: 87 fL (ref 80–100)
Platelet: 293 10*3/uL (ref 150–440)
RDW: 15.9 % — ABNORMAL HIGH (ref 11.5–14.5)

## 2011-09-12 LAB — TROPONIN I: Troponin-I: <0.02 ng/mL

## 2011-09-13 LAB — CBC WITH DIFFERENTIAL/PLATELET
Basophil %: 1.7 %
HCT: 33 % — ABNORMAL LOW (ref 35.0–47.0)
HGB: 11.1 g/dL — ABNORMAL LOW (ref 12.0–16.0)
Lymphocyte #: 1.7 10*3/uL (ref 1.0–3.6)
Lymphocyte %: 13.4 %
MCH: 29.1 pg (ref 26.0–34.0)
MCHC: 33.6 g/dL (ref 32.0–36.0)
Monocyte %: 5.4 %
Neutrophil #: 10.2 10*3/uL — ABNORMAL HIGH (ref 1.4–6.5)
Platelet: 294 10*3/uL (ref 150–440)

## 2011-09-13 LAB — CK TOTAL AND CKMB (NOT AT ARMC): CK-MB: 2.9 ng/mL (ref 0.5–3.6)

## 2011-09-13 LAB — TSH: Thyroid Stimulating Horm: 3.72 u[IU]/mL

## 2011-09-14 LAB — BASIC METABOLIC PANEL
Anion Gap: 11 (ref 7–16)
BUN: 10 mg/dL (ref 7–18)
Chloride: 104 mmol/L (ref 98–107)
Co2: 24 mmol/L (ref 21–32)
Creatinine: 0.66 mg/dL (ref 0.60–1.30)
EGFR (African American): >60
EGFR (Non-African Amer.): >60
Sodium: 139 mmol/L (ref 136–145)

## 2011-09-14 LAB — PROTEIN, URINE, 24 HOUR
Collection Hours: 24 hours
Protein, 24 Hour Urine: 344 mg/24HR — ABNORMAL HIGH (ref 30–149)
Protein, Urine: 17 mg/dL (ref 0–12)
Total Volume: 2025 mL

## 2011-09-27 ENCOUNTER — Other Ambulatory Visit (HOSPITAL_COMMUNITY): Payer: Self-pay | Admitting: Obstetrics & Gynecology

## 2011-09-27 DIAGNOSIS — O09529 Supervision of elderly multigravida, unspecified trimester: Secondary | ICD-10-CM

## 2011-09-28 ENCOUNTER — Ambulatory Visit (HOSPITAL_COMMUNITY): Payer: BC Managed Care – PPO | Attending: Obstetrics & Gynecology

## 2011-10-17 ENCOUNTER — Observation Stay: Payer: Self-pay | Admitting: Obstetrics and Gynecology

## 2011-10-17 LAB — PROTEIN / CREATININE RATIO, URINE: Protein, Random Urine: 40 mg/dL — ABNORMAL HIGH (ref 0–12)

## 2011-10-17 LAB — PIH PROFILE
Anion Gap: 12 (ref 7–16)
BUN: 6 mg/dL — ABNORMAL LOW (ref 7–18)
Calcium, Total: 8.6 mg/dL (ref 8.5–10.1)
Chloride: 107 mmol/L (ref 98–107)
Co2: 23 mmol/L (ref 21–32)
Creatinine: 0.57 mg/dL — ABNORMAL LOW (ref 0.60–1.30)
EGFR (Non-African Amer.): >60
HGB: 9.7 g/dL — ABNORMAL LOW (ref 12.0–16.0)
MCH: 28.6 pg (ref 26.0–34.0)
MCHC: 33 g/dL (ref 32.0–36.0)
Osmolality: 280 (ref 275–301)
RBC: 3.41 10*6/uL — ABNORMAL LOW (ref 3.80–5.20)
RDW: 15.8 % — ABNORMAL HIGH (ref 11.5–14.5)
SGOT(AST): 19 U/L (ref 15–37)
Uric Acid: 4 mg/dL (ref 2.6–6.0)

## 2011-10-18 LAB — PROTEIN, URINE, 24 HOUR
Collection Hours: 24 hours
Protein, 24 Hour Urine: 458 mg/24HR — ABNORMAL HIGH (ref 30–149)
Total Volume: 1525 mL

## 2011-11-09 ENCOUNTER — Observation Stay: Payer: Self-pay | Admitting: Obstetrics and Gynecology

## 2011-11-09 LAB — PIH PROFILE
Anion Gap: 8 (ref 7–16)
BUN: 6 mg/dL — ABNORMAL LOW (ref 7–18)
Calcium, Total: 9 mg/dL (ref 8.5–10.1)
Co2: 25 mmol/L (ref 21–32)
Glucose: 80 mg/dL (ref 65–99)
HGB: 11.4 g/dL — ABNORMAL LOW (ref 12.0–16.0)
MCH: 27.7 pg (ref 26.0–34.0)
MCHC: 32.3 g/dL (ref 32.0–36.0)
Osmolality: 270 (ref 275–301)
Potassium: 4 mmol/L (ref 3.5–5.1)
RDW: 15.7 % — ABNORMAL HIGH (ref 11.5–14.5)
SGOT(AST): 22 U/L (ref 15–37)
Uric Acid: 4 mg/dL (ref 2.6–6.0)
WBC: 11.6 10*3/uL — ABNORMAL HIGH (ref 3.6–11.0)

## 2011-11-09 LAB — PROTEIN / CREATININE RATIO, URINE
Creatinine, Urine: 66.8 mg/dL (ref 30.0–125.0)
Protein, Random Urine: 13 mg/dL — ABNORMAL HIGH (ref 0–12)

## 2011-11-13 ENCOUNTER — Observation Stay: Payer: Self-pay | Admitting: Obstetrics and Gynecology

## 2011-11-16 ENCOUNTER — Observation Stay: Payer: Self-pay | Admitting: Obstetrics and Gynecology

## 2011-11-16 LAB — PIH PROFILE
BUN: 9 mg/dL (ref 7–18)
Co2: 24 mmol/L (ref 21–32)
Creatinine: 0.59 mg/dL — ABNORMAL LOW (ref 0.60–1.30)
Glucose: 65 mg/dL (ref 65–99)
HCT: 34.6 % — ABNORMAL LOW (ref 35.0–47.0)
MCH: 28.5 pg (ref 26.0–34.0)
MCHC: 33.3 g/dL (ref 32.0–36.0)
MCV: 86 fL (ref 80–100)
Osmolality: 273 (ref 275–301)
Potassium: 4 mmol/L (ref 3.5–5.1)
RBC: 4.03 10*6/uL (ref 3.80–5.20)
RDW: 15.9 % — ABNORMAL HIGH (ref 11.5–14.5)
SGOT(AST): 20 U/L (ref 15–37)

## 2011-11-16 LAB — PROTEIN / CREATININE RATIO, URINE
Creatinine, Urine: 209.1 mg/dL — ABNORMAL HIGH (ref 30.0–125.0)
Protein/Creat. Ratio: 215 mg/gCREAT — ABNORMAL HIGH (ref 0–200)

## 2011-11-19 ENCOUNTER — Inpatient Hospital Stay: Payer: Self-pay | Admitting: Obstetrics and Gynecology

## 2011-11-19 LAB — PIH PROFILE
BUN: 12 mg/dL (ref 7–18)
EGFR (Non-African Amer.): >60
Glucose: 82 mg/dL (ref 65–99)
MCH: 27.6 pg (ref 26.0–34.0)
Osmolality: 278 (ref 275–301)
Platelet: 285 10*3/uL (ref 150–440)
Potassium: 4.1 mmol/L (ref 3.5–5.1)
RDW: 16 % — ABNORMAL HIGH (ref 11.5–14.5)
Uric Acid: 4.2 mg/dL (ref 2.6–6.0)
WBC: 12.6 10*3/uL — ABNORMAL HIGH (ref 3.6–11.0)

## 2011-11-20 LAB — HEMOGLOBIN: HGB: 10.8 g/dL — ABNORMAL LOW (ref 12.0–16.0)

## 2011-12-14 ENCOUNTER — Ambulatory Visit: Payer: Self-pay | Admitting: Obstetrics and Gynecology

## 2011-12-14 LAB — URINALYSIS, COMPLETE
Bacteria: NONE SEEN
Glucose,UR: NEGATIVE mg/dL (ref 0–75)
Ph: 6 (ref 4.5–8.0)
Protein: NEGATIVE

## 2011-12-29 ENCOUNTER — Ambulatory Visit: Payer: Self-pay | Admitting: Obstetrics and Gynecology

## 2012-12-12 ENCOUNTER — Inpatient Hospital Stay: Payer: Self-pay | Admitting: Internal Medicine

## 2012-12-12 LAB — CBC WITH DIFFERENTIAL/PLATELET
Basophil %: 0.5 %
Eosinophil #: 0.1 10*3/uL (ref 0.0–0.7)
HCT: 40.3 % (ref 35.0–47.0)
HGB: 13.3 g/dL (ref 12.0–16.0)
Lymphocyte %: 15.9 %
MCH: 27.4 pg (ref 26.0–34.0)
MCHC: 33.1 g/dL (ref 32.0–36.0)
MCV: 83 fL (ref 80–100)
Monocyte #: 0.6 x10 3/mm (ref 0.2–0.9)
Neutrophil #: 9.8 10*3/uL — ABNORMAL HIGH (ref 1.4–6.5)

## 2012-12-12 LAB — PRO B NATRIURETIC PEPTIDE: B-Type Natriuretic Peptide: 324 pg/mL — ABNORMAL HIGH (ref 0–125)

## 2012-12-12 LAB — COMPREHENSIVE METABOLIC PANEL
Albumin: 3.3 g/dL — ABNORMAL LOW (ref 3.4–5.0)
Alkaline Phosphatase: 117 U/L (ref 50–136)
Anion Gap: 6 — ABNORMAL LOW (ref 7–16)
BUN: 12 mg/dL (ref 7–18)
Chloride: 104 mmol/L (ref 98–107)
Co2: 28 mmol/L (ref 21–32)
Glucose: 125 mg/dL — ABNORMAL HIGH (ref 65–99)
Osmolality: 277 (ref 275–301)
Potassium: 3.1 mmol/L — ABNORMAL LOW (ref 3.5–5.1)
SGOT(AST): 28 U/L (ref 15–37)
Sodium: 138 mmol/L (ref 136–145)

## 2012-12-12 LAB — CK TOTAL AND CKMB (NOT AT ARMC): CK-MB: 2.3 ng/mL (ref 0.5–3.6)

## 2012-12-12 LAB — TSH: Thyroid Stimulating Horm: 2.65 u[IU]/mL

## 2012-12-13 LAB — CBC WITH DIFFERENTIAL/PLATELET
Basophil #: 0.1 x10 3/mm 3
Basophil %: 0.8 %
Eosinophil #: 0 x10 3/mm 3
Eosinophil %: 0.2 %
HCT: 36.6 %
HGB: 12.2 g/dL
Lymphocyte %: 17.3 %
Lymphs Abs: 2.1 x10 3/mm 3
MCH: 27.4 pg
MCHC: 33.5 g/dL
MCV: 82 fL
Monocyte #: 0.6 "x10 3/mm "
Monocyte %: 4.7 %
Neutrophil #: 9.5 x10 3/mm 3 — ABNORMAL HIGH
Neutrophil %: 77 %
Platelet: 308 x10 3/mm 3
RBC: 4.46 X10 6/mm 3
RDW: 15.8 % — ABNORMAL HIGH
WBC: 12.4 x10 3/mm 3 — ABNORMAL HIGH

## 2012-12-13 LAB — CK-MB
CK-MB: 1.4 ng/mL
CK-MB: 1.2 ng/mL

## 2012-12-13 LAB — LIPID PANEL
Cholesterol: 199 mg/dL
HDL Cholesterol: 52 mg/dL
Ldl Cholesterol, Calc: 130 mg/dL — ABNORMAL HIGH
Triglycerides: 84 mg/dL
VLDL Cholesterol, Calc: 17 mg/dL

## 2012-12-13 LAB — BASIC METABOLIC PANEL
Anion Gap: 5 — ABNORMAL LOW (ref 7–16)
BUN: 9 mg/dL (ref 7–18)
Chloride: 105 mmol/L (ref 98–107)
Co2: 28 mmol/L (ref 21–32)
Creatinine: 1.1 mg/dL (ref 0.60–1.30)
EGFR (Non-African Amer.): >60
Glucose: 134 mg/dL — ABNORMAL HIGH (ref 65–99)
Potassium: 3.3 mmol/L — ABNORMAL LOW (ref 3.5–5.1)
Sodium: 138 mmol/L (ref 136–145)

## 2012-12-13 LAB — POTASSIUM: Potassium: 3.5 mmol/L

## 2012-12-13 LAB — TROPONIN I: Troponin-I: <0.02 ng/mL

## 2012-12-13 LAB — MAGNESIUM
Magnesium: 1.6 mg/dL — ABNORMAL LOW
Magnesium: 2.1 mg/dL

## 2012-12-13 LAB — TSH: Thyroid Stimulating Horm: 1.17 u[IU]/mL

## 2012-12-13 LAB — HEMOGLOBIN A1C: Hemoglobin A1C: 6.6 % — ABNORMAL HIGH

## 2012-12-14 LAB — CBC WITH DIFFERENTIAL/PLATELET
Basophil %: 0.6 %
Eosinophil #: 0.1 10*3/uL (ref 0.0–0.7)
Eosinophil %: 1.4 %
HCT: 37.6 % (ref 35.0–47.0)
HGB: 12.7 g/dL (ref 12.0–16.0)
Lymphocyte #: 2.7 10*3/uL (ref 1.0–3.6)
MCH: 28.1 pg (ref 26.0–34.0)
MCHC: 33.8 g/dL (ref 32.0–36.0)
MCV: 83 fL (ref 80–100)
Monocyte #: 0.7 x10 3/mm (ref 0.2–0.9)
Monocyte %: 7 %
Neutrophil #: 6 10*3/uL (ref 1.4–6.5)
Neutrophil %: 62.5 %
RBC: 4.53 10*6/uL (ref 3.80–5.20)
RDW: 16 % — ABNORMAL HIGH (ref 11.5–14.5)
WBC: 9.6 10*3/uL (ref 3.6–11.0)

## 2012-12-14 LAB — BASIC METABOLIC PANEL
Anion Gap: 6 — ABNORMAL LOW (ref 7–16)
BUN: 17 mg/dL (ref 7–18)
Chloride: 102 mmol/L (ref 98–107)
Co2: 30 mmol/L (ref 21–32)
Creatinine: 1.23 mg/dL (ref 0.60–1.30)
Osmolality: 278 (ref 275–301)
Potassium: 3.5 mmol/L (ref 3.5–5.1)
Sodium: 138 mmol/L (ref 136–145)

## 2012-12-15 LAB — CBC WITH DIFFERENTIAL/PLATELET
Basophil %: 0.6 %
HCT: 36.9 % (ref 35.0–47.0)
HGB: 12.6 g/dL (ref 12.0–16.0)
Lymphocyte %: 22.1 %
MCHC: 34.1 g/dL (ref 32.0–36.0)
Monocyte #: 0.6 x10 3/mm (ref 0.2–0.9)
Neutrophil #: 7.2 10*3/uL — ABNORMAL HIGH (ref 1.4–6.5)
RBC: 4.47 10*6/uL (ref 3.80–5.20)
RDW: 16.6 % — ABNORMAL HIGH (ref 11.5–14.5)

## 2012-12-15 LAB — BASIC METABOLIC PANEL
BUN: 26 mg/dL — ABNORMAL HIGH (ref 7–18)
Chloride: 101 mmol/L (ref 98–107)
Co2: 27 mmol/L (ref 21–32)
Creatinine: 1.63 mg/dL — ABNORMAL HIGH (ref 0.60–1.30)
Glucose: 108 mg/dL — ABNORMAL HIGH (ref 65–99)
Osmolality: 277 (ref 275–301)
Potassium: 3.5 mmol/L (ref 3.5–5.1)
Sodium: 136 mmol/L (ref 136–145)

## 2012-12-16 LAB — CBC WITH DIFFERENTIAL/PLATELET
Eosinophil %: 2.2 %
HCT: 36.1 % (ref 35.0–47.0)
Lymphocyte %: 28.1 %
MCV: 83 fL (ref 80–100)
Monocyte #: 0.7 x10 3/mm (ref 0.2–0.9)
Monocyte %: 7.4 %
Neutrophil #: 6 10*3/uL (ref 1.4–6.5)
Neutrophil %: 61.7 %
WBC: 9.8 10*3/uL (ref 3.6–11.0)

## 2012-12-16 LAB — BASIC METABOLIC PANEL
BUN: 25 mg/dL — ABNORMAL HIGH (ref 7–18)
Calcium, Total: 9.2 mg/dL (ref 8.5–10.1)
Chloride: 104 mmol/L (ref 98–107)
Co2: 28 mmol/L (ref 21–32)
Creatinine: 1.55 mg/dL — ABNORMAL HIGH (ref 0.60–1.30)
EGFR (Non-African Amer.): 42 — ABNORMAL LOW
Sodium: 138 mmol/L (ref 136–145)

## 2013-07-14 ENCOUNTER — Emergency Department: Payer: Self-pay | Admitting: Emergency Medicine

## 2013-07-14 LAB — URINALYSIS, COMPLETE
Bilirubin,UR: NEGATIVE
Blood: NEGATIVE
Glucose,UR: NEGATIVE mg/dL (ref 0–75)
Nitrite: NEGATIVE
Ph: 7 (ref 4.5–8.0)
Protein: NEGATIVE
Specific Gravity: 1.011 (ref 1.003–1.030)
Squamous Epithelial: 2
WBC UR: 1 /HPF (ref 0–5)

## 2013-07-14 LAB — BASIC METABOLIC PANEL
Anion Gap: 4 — ABNORMAL LOW (ref 7–16)
BUN: 10 mg/dL (ref 7–18)
Calcium, Total: 9 mg/dL (ref 8.5–10.1)
EGFR (African American): >60
EGFR (Non-African Amer.): >60
Glucose: 102 mg/dL — ABNORMAL HIGH (ref 65–99)
Potassium: 3 mmol/L — ABNORMAL LOW (ref 3.5–5.1)
Sodium: 139 mmol/L (ref 136–145)

## 2013-07-14 LAB — CBC WITH DIFFERENTIAL/PLATELET
Basophil #: 0.1 10*3/uL (ref 0.0–0.1)
Basophil %: 1.3 %
Eosinophil #: 0.2 10*3/uL (ref 0.0–0.7)
Eosinophil %: 1.9 %
HGB: 12.5 g/dL (ref 12.0–16.0)
Lymphocyte #: 3 10*3/uL (ref 1.0–3.6)
Lymphocyte %: 32.2 %
MCH: 26.5 pg (ref 26.0–34.0)
MCV: 82 fL (ref 80–100)
Monocyte #: 0.6 x10 3/mm (ref 0.2–0.9)
Monocyte %: 6.9 %
Platelet: 354 10*3/uL (ref 150–440)
RDW: 15.2 % — ABNORMAL HIGH (ref 11.5–14.5)
WBC: 9.3 10*3/uL (ref 3.6–11.0)

## 2013-11-11 DIAGNOSIS — I509 Heart failure, unspecified: Secondary | ICD-10-CM | POA: Insufficient documentation

## 2014-02-16 DIAGNOSIS — I517 Cardiomegaly: Secondary | ICD-10-CM | POA: Insufficient documentation

## 2014-03-11 DIAGNOSIS — R8290 Unspecified abnormal findings in urine: Secondary | ICD-10-CM | POA: Insufficient documentation

## 2014-03-11 DIAGNOSIS — R32 Unspecified urinary incontinence: Secondary | ICD-10-CM | POA: Insufficient documentation

## 2014-03-18 DIAGNOSIS — E876 Hypokalemia: Secondary | ICD-10-CM | POA: Insufficient documentation

## 2014-03-18 DIAGNOSIS — N390 Urinary tract infection, site not specified: Secondary | ICD-10-CM | POA: Insufficient documentation

## 2014-03-25 ENCOUNTER — Encounter: Payer: Self-pay | Admitting: Cardiovascular Disease

## 2014-03-25 ENCOUNTER — Ambulatory Visit (INDEPENDENT_AMBULATORY_CARE_PROVIDER_SITE_OTHER): Payer: Medicaid Other | Admitting: Cardiovascular Disease

## 2014-03-25 VITALS — BP 138/100 | HR 63 | Ht 66.0 in | Wt 274.5 lb

## 2014-03-25 DIAGNOSIS — I509 Heart failure, unspecified: Secondary | ICD-10-CM

## 2014-03-25 DIAGNOSIS — I5032 Chronic diastolic (congestive) heart failure: Secondary | ICD-10-CM

## 2014-03-25 MED ORDER — ASPIRIN 81 MG PO TABS: 81.0000 mg | ORAL_TABLET | Freq: Every day | ORAL | Status: DC

## 2014-03-25 MED ORDER — CARVEDILOL 12.5 MG PO TABS: 12.5000 mg | ORAL_TABLET | Freq: Two times a day (BID) | ORAL | Status: DC

## 2014-03-25 MED ORDER — FUROSEMIDE 40 MG PO TABS: 40.0000 mg | ORAL_TABLET | Freq: Every day | ORAL | Status: DC

## 2014-03-25 NOTE — Patient Instructions (Addendum)
Your physician has recommended you make the following change in your medication:  Decrease Aspirin to 81 mg once daily  Start taking 40 mg of Lasix everyday  Start Coreg 12.5 mg twice daily  Stop Tenoretic   Your physician recommends that you schedule a follow-up appointment in:  4-6 weeks with Dr. Elease Hashimoto

## 2014-03-25 NOTE — Progress Notes (Signed)
[name] Date of Birth  1976/06/26       [facility]    [place] [address]  [address], [address] [place], [place]  [address]   [place], [place]  [address] [phone]     [phone]   Fax  [phone]     Fax [phone]  Problem List: 1. Hypertension 2. Congestive heart failure- 3. Diabetes Mellitus   History of Present Illness:  Kaly is a 38 yo with the dx of CHF.  She has seen Dr. Welton Flakes.  She was diagnosed with CHF in the middle of her pregnancy.  She changed doctors because of her changed of insurance.   Recently, she has had some leg swelling and hand swelling. She has DOE with everyday chores ( making the beds).  She walks occasionally.  Still not able to go over 20 minutes at a time .   She had an echo in late 2014 [facility])   She tries to watch her salt.  Uses Ms. Dash.     No current outpatient prescriptions on file prior to visit.   No current facility-administered medications on file prior to visit.    Allergies  Allergen Reactions  . Penicillins Anaphylaxis  . Carvedilol     headache    Past Medical History  Diagnosis Date  . Hypertension   . Heart murmur   . Hyperlipidemia     no meds since pregnancy  . Shortness of breath     with exercise  . Headache(784.0)     otc med prn  . Arthritis     kness/hands - no meds  . Obesity   . CHF (congestive heart failure)   . Arrhythmia   . Diabetes mellitus without complication     Past Surgical History  Procedure Laterality Date  . Cervical cerclage  06/01/2011    Procedure: CERCLAGE CERVICAL;  Surgeon: Philip Aspen, DO;  Location: [facility];  Service: Gynecology;  Laterality: N/A;  REQUESTING PORTABLE ULTARSOUND AT BEDSIDE PT'S EDC:12/02/2011  . Svd      x 2  . Endosocpy    . Tab      x 1  . Cervical cerclage  06/01/2011    Procedure: CERCLAGE CERVICAL;  Surgeon: Philip Aspen, DO;  Location: [facility];  Service: Gynecology;  Laterality: N/A;   REQUESTING PORTABLE ULTARSOUND AT BEDSIDE PT'S EDC:12/02/2011    History  Smoking status  . Never Smoker   Smokeless tobacco  . Never Used    History  Alcohol Use No    Family History  Problem Relation Age of Onset  . Stroke Mother   . Hypertension Mother   . Hyperlipidemia Mother   . Hypertension Father     Reviw of Systems:  Reviewed in the HPI.  All other systems are negative.  Physical Exam: Blood pressure 138/100, pulse 63, height  (1.676 m), weight 274 lb 8 oz (124.512 kg). Wt Readings from Last 3 Encounters:  03/25/14 274 lb 8 oz (124.512 kg)  08/17/11 282 lb (127.914 kg)  07/30/11 278 lb (126.1 kg)     General: Well developed, well nourished, in no acute distress.  Head: Normocephalic, atraumatic, sclera non-icteric, mucus membranes are moist,   Neck: Supple. Carotids are 2 + without bruits. No JVD   Lungs: Clear   Heart: RR, normla s1s2, no murmur, no gallop  Abdomen: Soft, non-tender, non-distended with normal bowel sounds.  Msk:  Strength and tone  are normal   Extremities: No clubbing or cyanosis. No edema.  Distal pedal pulses are 2+ and equal    Neuro: CN II - XII intact.  Alert and oriented X 3.   Psych:  Normal  her ECG: 03/25/2014: Normal sinus rhythm at 63. She has nonspecific ST-T wave changes in the lateral use.  Assessment / Plan:

## 2014-03-25 NOTE — Assessment & Plan Note (Signed)
Heather Norman presents for further evaluation and management of her chronic diastolic congestive heart failure. GEN an echocardiogram in May, 2014 which reveals normal left ventricular systolic function with an ejection fraction of 55-60%. She does have elevated end diastolic pressures.  I would like To change some of her medications. We will discontinue the Tenoretic. We'll start her on carvedilol 12.5 mg twice a day. We will change the Lasix to 40 mg on a daily basis instead of as needed.  She was also on chlorthalidone in the Tenoretic tablet which will also be  discontinued at this time  We will reduce her aspirin to 81 mg a day. I'll see her in the office in 4-6 weeks for followup visit.  Encourage her to exercise on a regular basis.

## 2014-05-01 ENCOUNTER — Ambulatory Visit (INDEPENDENT_AMBULATORY_CARE_PROVIDER_SITE_OTHER): Payer: Medicaid Other | Admitting: Cardiovascular Disease

## 2014-05-01 ENCOUNTER — Encounter: Payer: Self-pay | Admitting: Cardiovascular Disease

## 2014-05-01 VITALS — BP 148/98 | HR 72 | Ht 66.0 in | Wt 280.0 lb

## 2014-05-01 DIAGNOSIS — I5032 Chronic diastolic (congestive) heart failure: Secondary | ICD-10-CM

## 2014-05-01 MED ORDER — FUROSEMIDE 40 MG PO TABS: 40.0000 mg | ORAL_TABLET | Freq: Two times a day (BID) | ORAL | Status: DC

## 2014-05-01 MED ORDER — POTASSIUM CHLORIDE CRYS ER 20 MEQ PO TBCR: 20.0000 meq | EXTENDED_RELEASE_TABLET | Freq: Two times a day (BID) | ORAL | Status: DC

## 2014-05-01 NOTE — Patient Instructions (Addendum)
Increase K-dur to 20 mg take one tablet twice a day. Increase Lasix (furosemide) to 40 mg take one tablet twice a day.   BMP today.  Your physician has requested that you have an echocardiogram. Echocardiography is a painless test that uses sound waves to create images of your heart. It provides your doctor with information about the size and shape of your heart and how well your heart's chambers and valves are working. This procedure takes approximately one hour. There are no restrictions for this procedure.  Follow up with Dr. Elease Hashimoto in 2-3 months.

## 2014-05-01 NOTE — Progress Notes (Signed)
Bing Plumeamieka S Tanori Date of Birth  11/07/1975       [facility]    [place] [facility] [address]  [address] [place]  [id number]   [place], [place]  [phone]     [phone]   Fax  [phone]     Fax [phone]  Problem List: 1. Hypertension 2. Congestive heart failure- 3. Diabetes Mellitus   History of Present Illness:  Heather Norman is a 38 yo with the dx of CHF.  She has seen Dr. Welton FlakesKhan.  She was diagnosed with CHF in the middle of her pregnancy.  She changed doctors because of her changed of insurance.   Recently, she has had some leg swelling and hand swelling. She has DOE with everyday chores ( making the beds).  She walks occasionally.  Still not able to go over 20 minutes at a time .   She had an echo in late 2014 [facility]   She tries to watch her salt.  Uses Ms. Dash.    Oct. 2, 2015:  Heather Norman is doing better.  We changed her tenoretic to Coreg and Lasix.      Current Outpatient Prescriptions on File Prior to Visit  Medication Sig Dispense Refill  . aspirin 81 MG tablet Take 1 tablet (81 mg total) by mouth daily.  30 tablet    . atorvastatin (LIPITOR) 20 MG tablet Take 20 mg by mouth daily.      . carvedilol (COREG) 12.5 MG tablet Take 1 tablet (12.5 mg total) by mouth 2 (two) times daily.  180 tablet  3  . cetirizine (ZYRTEC) 10 MG tablet Take 10 mg by mouth daily.      . ferrous sulfate 325 (65 FE) MG tablet Take 325 mg by mouth daily with breakfast.      . fluticasone (VERAMYST) 27.5 MCG/SPRAY nasal spray Place 2 sprays into the nose daily.      . furosemide (LASIX) 40 MG tablet Take 1 tablet (40 mg total) by mouth daily.  30 tablet  6  . lisinopril (PRINIVIL,ZESTRIL) 40 MG tablet Take 40 mg by mouth daily.      . metFORMIN (GLUCOPHAGE) 500 MG tablet Take 500 mg by mouth 2 (two) times daily with a meal.      . oxybutynin (DITROPAN-XL) 5 MG 24 hr tablet Take 5 mg by mouth 2 (two) times daily.      .  pantoprazole (PROTONIX) 40 MG tablet Take 40 mg by mouth daily.      . potassium chloride (K-DUR,KLOR-CON) 10 MEQ tablet Take 10 mEq by mouth daily.       No current facility-administered medications on file prior to visit.    Allergies  Allergen Reactions  . Penicillins Anaphylaxis    Past Medical History  Diagnosis Date  . Hypertension   . Heart murmur   . Hyperlipidemia     no meds since pregnancy  . Shortness of breath     with exercise  . Headache(784.0)     otc med prn  . Arthritis     kness/hands - no meds  . Obesity   . CHF (congestive heart failure)   . Arrhythmia   . Diabetes mellitus without complication     Past Surgical History  Procedure Laterality Date  . Cervical cerclage  06/01/2011    Procedure: CERCLAGE CERVICAL;  Surgeon: Philip AspenSidney Callahan, DO;  Location: [facility];  Service: Gynecology;  Laterality: N/A;  REQUESTING PORTABLE ULTARSOUND AT BEDSIDE PT'S EDC:12/02/2011  . Svd      x 2  . Endosocpy    . Tab      x 1  . Cervical cerclage  06/01/2011    Procedure: CERCLAGE CERVICAL;  Surgeon: Philip Aspen, DO;  Location: [facility];  Service: Gynecology;  Laterality: N/A;  REQUESTING PORTABLE ULTARSOUND AT BEDSIDE PT'S EDC:12/02/2011    History  Smoking status  . Never Smoker   Smokeless tobacco  . Never Used    History  Alcohol Use No    Family History  Problem Relation Age of Onset  . Stroke Mother   . Hypertension Mother   . Hyperlipidemia Mother   . Hypertension Father     Reviw of Systems:  Reviewed in the HPI.  All other systems are negative.  Physical Exam: Blood pressure 148/98, pulse 72, height 5\' 6"  (1.676 m), weight 280 lb (127.007 kg). Wt Readings from Last 3 Encounters:  05/01/14 280 lb (127.007 kg)  03/25/14 274 lb 8 oz (124.512 kg)  08/17/11 282 lb (127.914 kg)     General: Well developed, well nourished, in no acute distress.  Head: Normocephalic, atraumatic, sclera non-icteric, mucus membranes are moist,   Neck:  Supple. Carotids are 2 + without bruits. No JVD   Lungs: Clear   Heart: RR, normla s1s2, no murmur, no gallop  Abdomen: Soft, non-tender, non-distended with normal bowel sounds.  Msk:  Strength and tone are normal   Extremities: No clubbing or cyanosis. No edema.  Distal pedal pulses are 2+ and equal    Neuro: CN II - XII intact.  Alert and oriented X 3.   Psych:  Normal  her ECG: 03/25/2014: Normal sinus rhythm at 63. She has nonspecific ST-T wave changes in the lateral use.  Assessment / Plan:

## 2014-05-01 NOTE — Assessment & Plan Note (Signed)
Heather Norman seems to be feeling much better on her new medicines. Her blood pressure still elevated. She also still has problems with swelling. We will check a basic metabolic profile today.  We'll increase the Lasix to 40 mg twice a day. She has a history of hypokalemia. We will increase her potassium chloride to 20 mEq twice a day. We'll repeat her echocardiogram to followup with her diastolic dysfunction. Her last echo was in 2013.  I'll see her again in 2-3 months for followup visit.

## 2014-05-02 LAB — BASIC METABOLIC PANEL
BUN/Creatinine Ratio: 13 (ref 8–20)
BUN: 11 mg/dL (ref 6–20)
CALCIUM: 9.2 mg/dL (ref 8.7–10.2)
CHLORIDE: 102 mmol/L (ref 97–108)
CO2: 24 mmol/L (ref 18–29)
Creatinine, Ser: 0.87 mg/dL (ref 0.57–1.00)
GFR calc Af Amer: 97 mL/min/{1.73_m2} (ref >59)
GFR calc non Af Amer: 84 mL/min/{1.73_m2} (ref >59)
GLUCOSE: 92 mg/dL (ref 65–99)
Potassium: 3.8 mmol/L (ref 3.5–5.2)
Sodium: 142 mmol/L (ref 134–144)

## 2014-05-04 ENCOUNTER — Telehealth: Payer: Self-pay | Admitting: *Deleted

## 2014-05-04 DIAGNOSIS — I5022 Chronic systolic (congestive) heart failure: Secondary | ICD-10-CM

## 2014-05-04 NOTE — Telephone Encounter (Signed)
Message copied by Fransico Setters on Mon May 04, 2014  8:04 AM ------      Message from: Sandre Kitty F      Created: Fri May 01, 2014 11:11 AM      Regarding: echo order       Echo order is in wrong for this pt,. Usually we do not do with contrast here.       thx ------

## 2014-05-15 ENCOUNTER — Other Ambulatory Visit: Payer: Self-pay

## 2014-05-15 ENCOUNTER — Other Ambulatory Visit (INDEPENDENT_AMBULATORY_CARE_PROVIDER_SITE_OTHER): Payer: Medicaid Other

## 2014-05-15 DIAGNOSIS — R0602 Shortness of breath: Secondary | ICD-10-CM

## 2014-05-15 DIAGNOSIS — I5032 Chronic diastolic (congestive) heart failure: Secondary | ICD-10-CM

## 2014-05-20 ENCOUNTER — Telehealth: Payer: Self-pay | Admitting: Cardiovascular Disease

## 2014-05-20 NOTE — Telephone Encounter (Signed)
Returned call to patient  LVM 10/21 See result note

## 2014-05-20 NOTE — Telephone Encounter (Signed)
Pt was calling us, for she got a call from [place], and she thinks it may be about her EKG that was done when she was here last. Please call patient.

## 2014-06-01 ENCOUNTER — Encounter: Payer: Self-pay | Admitting: Cardiovascular Disease

## 2014-07-03 ENCOUNTER — Encounter: Payer: Self-pay | Admitting: Cardiovascular Disease

## 2014-07-03 ENCOUNTER — Ambulatory Visit (INDEPENDENT_AMBULATORY_CARE_PROVIDER_SITE_OTHER): Payer: Medicaid Other | Admitting: Cardiovascular Disease

## 2014-07-03 VITALS — BP 132/90 | HR 82 | Ht 66.0 in | Wt 279.5 lb

## 2014-07-03 DIAGNOSIS — R0602 Shortness of breath: Secondary | ICD-10-CM

## 2014-07-03 DIAGNOSIS — I5032 Chronic diastolic (congestive) heart failure: Secondary | ICD-10-CM

## 2014-07-03 NOTE — Assessment & Plan Note (Addendum)
Heather Norman presents for follow-up of her shortness of breath and history of diastolic congestive  heart failure. She recent had an echocardiogram  which reveals normal systolic and diastolic function. She's feeling quite a bit better on the carvedilol and Lasix.  We'll continue with current medications.  She inquired about going on disability. I told her at this point there is no reason for her to consider disability since she is doing so well. Her cardiac function is normal.  I'll see her again in 6 month for follow-up visit pedal check fasting labs at that time.

## 2014-07-03 NOTE — Progress Notes (Signed)
[name] Date of Birth  Aug 23, 1975       [facility]    [place] [address]  [address] [place], [place]  [address]   [place], [place]  [address] [phone]     [phone]   Fax  [phone]     Fax [phone]  Problem List: 1. Hypertension 2. Congestive heart failure- 3. Diabetes Mellitus   History of Present Illness:  Heather Norman is a 38 yo with the dx of CHF.  She has seen Dr. Welton Flakes.  She was diagnosed with CHF in the middle of her pregnancy.  She changed doctors because of her changed of insurance.   Recently, she has had some leg swelling and hand swelling. She has DOE with everyday chores ( making the beds).  She walks occasionally.  Still not able to go over 20 minutes at a time .   She had an echo in late 2014 [facility])   She tries to watch her salt.  Uses Ms. Dash.    Oct. 2, 2015:  [name] is doing better.  We changed her tenoretic to Coreg and Lasix.   Dec. 4, 2015:  Feels better.  She is tolerating the coreg and lasix well.   Echo :  Shows normal LV systolic and diastolic  function , mild MR.     Current Outpatient Prescriptions on File Prior to Visit  Medication Sig Dispense Refill  . aspirin 81 MG tablet Take 1 tablet (81 mg total) by mouth daily. 30 tablet   . atorvastatin (LIPITOR) 20 MG tablet Take 20 mg by mouth daily.    . carvedilol (COREG) 12.5 MG tablet Take 1 tablet (12.5 mg total) by mouth 2 (two) times daily. 180 tablet 3  . cetirizine (ZYRTEC) 10 MG tablet Take 10 mg by mouth daily.    . ferrous sulfate 325 (65 FE) MG tablet Take 325 mg by mouth 2 (two) times daily with a meal.     . fluticasone (VERAMYST) 27.5 MCG/SPRAY nasal spray Place 2 sprays into the nose daily.    . furosemide (LASIX) 40 MG tablet Take 1 tablet (40 mg total) by mouth 2 (two) times daily. 60 tablet 6  . lisinopril (PRINIVIL,ZESTRIL) 40 MG tablet Take 40 mg by mouth daily.    . metFORMIN (GLUCOPHAGE) 500 MG  tablet Take 500 mg by mouth 2 (two) times daily with a meal.    . oxybutynin (DITROPAN-XL) 5 MG 24 hr tablet Take 5 mg by mouth 2 (two) times daily.    . pantoprazole (PROTONIX) 40 MG tablet Take 40 mg by mouth daily.    . potassium chloride SA (KLOR-CON M20) 20 MEQ tablet Take 1 tablet (20 mEq total) by mouth 2 (two) times daily. 60 tablet 3   No current facility-administered medications on file prior to visit.    Allergies  Allergen Reactions  . Penicillins Anaphylaxis    Past Medical History  Diagnosis Date  . Hypertension   . Heart murmur   . Hyperlipidemia     no meds since pregnancy  . Shortness of breath     with exercise  . Headache(784.0)     otc med prn  . Arthritis     kness/hands - no meds  . Obesity   . CHF (congestive heart failure)   . Arrhythmia   . Diabetes mellitus without complication     Past Surgical History  Procedure Laterality Date  .  Cervical cerclage  06/01/2011    Procedure: CERCLAGE CERVICAL;  Surgeon:  AspenSidney Callahan, DO;  Location: [facility];  Service: Gynecology;  Laterality: N/A;  REQUESTING PORTABLE ULTARSOUND AT BEDSIDE PT'S EDC:12/02/2011  . Svd      x 2  . Endosocpy    . Tab      x 1  . Cervical cerclage  06/01/2011    Procedure: CERCLAGE CERVICAL;  Surgeon:  AspenSidney Callahan, DO;  Location: [facility];  Service: Gynecology;  Laterality: N/A;  REQUESTING PORTABLE ULTARSOUND AT BEDSIDE PT'S EDC:12/02/2011    History  Smoking status  . Never Smoker   Smokeless tobacco  . Never Used    History  Alcohol Use No    Family History  Problem Relation Age of Onset  . Stroke Mother   . Hypertension Mother   . Hyperlipidemia Mother   . Hypertension Father     Reviw of Systems:  Reviewed in the HPI.  All other systems are negative.  Physical Exam: Blood pressure 132/90, pulse 82, height 5\' 6"  (1.676 m), weight 279 lb 8 oz (126.78 kg). Wt Readings from Last 3 Encounters:  07/03/14 279 lb 8 oz (126.78 kg)  05/01/14 280 lb (127.007 kg)    03/25/14 274 lb 8 oz (124.512 kg)     General: Well developed, well nourished, in no acute distress.  Head: Normocephalic, atraumatic, sclera non-icteric, mucus membranes are moist,   Neck: Supple. Carotids are 2 + without bruits. No JVD   Lungs: Clear   Heart: RR, normla s1s2, no murmur, no gallop  Abdomen: Soft, non-tender, non-distended with normal bowel sounds.  Msk:  Strength and tone are normal   Extremities: No clubbing or cyanosis. No edema.  Distal pedal pulses are 2+ and equal    Neuro: CN II - XII intact.  Alert and oriented X 3.   Psych:  Normal  her ECG: Dec. 4 ,2015:  NSR at 82. Nonspecific ST abn.  Assessment / Plan:

## 2014-07-03 NOTE — Patient Instructions (Signed)
Your physician recommends that you continue on your current medications as directed. Please refer to the Current Medication list given to you today.  Your physician wants you to follow-up in: 6 months.  You will receive a reminder letter in the mail two months in advance.  If you don't receive a letter, please call our office to schedule the follow-up appointment.  Your physician recommends that you return for lab work at your next office visit: BMET, lipid, liver

## 2014-07-28 DIAGNOSIS — J3089 Other allergic rhinitis: Secondary | ICD-10-CM | POA: Insufficient documentation

## 2014-07-28 DIAGNOSIS — Z3042 Encounter for surveillance of injectable contraceptive: Secondary | ICD-10-CM | POA: Insufficient documentation

## 2014-10-29 ENCOUNTER — Telehealth: Payer: Self-pay | Admitting: Cardiovascular Disease

## 2014-10-29 NOTE — Telephone Encounter (Signed)
Patient attempted to schedule 6 month fu due in June.  Schedule unavailable at this time.  Patient instructed to call back in May.

## 2014-11-20 NOTE — H&P (Signed)
PATIENT NAME:  Heather Norman, Heather Norman MR#:  [id number] DATE OF BIRTH:  18-May-1976  DATE OF ADMISSION:  12/12/2012  PRIMARY CARE PHYSICIAN: Dr. Ellsworth Lennox.  CHIEF COMPLAINT:  Shortness of breath.   HISTORY OF PRESENT ILLNESS: The patient is a pleasant morbidly obese 39 year old [place] American female with history of hypertension, who follows with Dr. Ellsworth Lennox. The patient, of note, had a pregnancy complicated with diastolic CHF in February 2013 and saw Dr. Welton Flakes. The patient has been doing overall okay; however, in the last 4 days, has not taken her blood pressure medications because she states whenever she takes them she has got a headache. Her headaches are usually in the morning and sometimes could last all day and go away after some Tylenol at times. The patient has been having progressive shortness of breath, dyspnea on exertion since April. She has had also 5 to 10 pound weight gain. She has occasional lower extremity edema. The patient presented to the hospital and was noted to have significantly elevated blood pressure with hypertensive emergency with systolic of 213/102, and also acute respiratory failure with O2 sat of 86%. Although she did not have elevated BNP and it was 324, x-ray showed interstitial and alveolar edema of the chest, and a CT PE protocol was done as well, which did not show any PE, but pulmonary edema is suspected. She has received several doses of IV labetalol, and the blood pressure was still high. She has also received 40 mg of IV Lasix. Hospitalist services were contacted for further evaluation and management.   PAST MEDICAL HISTORY: Hypertension, acute diastolic CHF  last year. The patient is not on any diuretics, also seasonal allergies.   OUTPATIENT MEDICATIONS: Amlodipine 10 mg daily, aspirin 81 mg daily, chlorthalidone 25 mg daily, Diovan 320 mg daily, multivitamin 1 tab daily, Oxytrol extended-release 1 patch transdermally 3 times a week, Zyrtec 10 mg daily.   PAST  SURGICAL HISTORY: Cerclage and tubal ligation.   ALLERGIES: PENICILLIN.   FAMILY HISTORY: Mom with hypertension, dad with hypertension. No tobacco, alcohol or drug use.    SOCIAL HISTORY: Does not work currently.   REVIEW OF SYSTEMS:   CONSTITUTIONAL: No fever or fatigue, 5 to 10 pound weight gain in the last several months. No chills.  EYES: No blurry vision, double vision.  EARS, NOSE, THROAT: No tinnitus or hearing loss. Has some seasonal allergies.  RESPIRATORY: No cough or wheezing. Positive for dyspnea on exertion and shortness of breath.  CARDIOVASCULAR: Has off-and-on palpitations with shortness of breath and swelling in the legs. Of note, the patient also has two-pillow orthopnea.  GASTROINTESTINAL: No nausea, vomiting, diarrhea, abdominal pain. No melena.  GENITOURINARY: Denies dysuria or hematuria.  ENDOCRINE: No polyuria or nocturia.  HEMATOLOGIC AND LYMPHATICS:  No anemia or easy bruising.  SKIN: No rashes.  MUSCULOSKELETAL: Has some arthritis.  NEUROLOGIC: Denies focal weakness or numbness, besides some numbness in the right hand.  PSYCHIATRIC: No anxiety or insomnia.    PHYSICAL EXAMINATION: VITAL SIGNS: Temperature on arrival 98.7, pulse rate 110, respiratory rate 36, blood pressure 213/109. Last blood pressure 213/104. Currently, she has been started on a nitroglycerin drip. Initial respiratory rate 36 and pulse rate of 110.  GENERAL:  The patient is a morbidly obese African American female sitting in bed, talking in full sentences at this point.  HEENT: Normocephalic, atraumatic. Pupils are equal and reactive. Anicteric sclerae. Extraocular muscles intact. Moist mucous membranes.  NECK: Supple. No thyroid tenderness. No cervical lymphadenopathy. No JVD.  CARDIOVASCULAR:  S1, S2.  Irregular rhythm. Tachycardic. No murmurs, rubs or gallops.  LUNGS: Scattered crackles to mid lungs bilaterally.  ABDOMEN: Soft, nontender, nondistended. Positive bowel sounds in all quadrants.   EXTREMITIES: 1+ edema to mid shins.  NEUROLOGIC: Cranial nerves II through XII are grossly intact. Strength is 5/5 in all extremities. Sensation is intact to light touch.  PSYCHIATRIC: Awake, alert, oriented x 3. Pleasant and cooperative.   LABORATORY, DIAGNOSTIC AND RADIOLOGICAL DATA: BNP 324, glucose 125, BUN 12, creatinine 0.87, potassium 3.1. LFTs: Albumin is 3.3, otherwise within normal limits. CK total of 408, CK-MB 2.3, troponin negative. WBC 12.4, hemoglobin 13.3, platelets 230. CT of the chest as above. X-ray of the chest as above.  EKG: Normal sinus rhythm. LVH with some early repolarization abnormality. There are no acute ST elevations or depressions.  There are some T wave inversions in I and aVL.   ASSESSMENT AND PLAN: We have a pleasant 39 year old African American female with a history of diastolic congestive heart failure exacerbation last year, hypertension, which appears to be poorly controlled, who has not taken medications for 4 days, as she thinks they have been  giving her headaches in the mornings, presents with hypertensive emergency associated with acute flash edema, possibly secondary to hypertensive emergency with hypokalemia. At this point, we will admit the patient to the hospital and admit her to the CCU. She has been started on nitroglycerin drip and would keep her blood pressure in the 160s to 170s if possible.  She likely has acute likely on chronic diastolic CHF exacerbation. Would start her on Lasix IV b.i.d., obtain an echocardiogram, start her on oxygen and obtain a cardiology consult in the morning. Would also start her on morphine p.r.n. and cycle the troponins and CK-MB. Would give her some Losartan, as well as monitor her ins and outs. The patient also has acute respiratory failure as well with tachypnea and hypoxia on arrival, likely secondary to acute flash edema and acute diastolic CHF. She does have hypokalemia, and this will be repleted then rechecked in the  morning. Would start her on heparin for DVT prophylaxis.   CODE STATUS: Full code.   TOTAL CRITICAL CARE TIME SPENT: 50 minutes.     ____________________________ Krystal Eaton, MD sa:dmm D: 12/12/2012 19:59:53 ET T: 12/12/2012 20:23:26 ET JOB#: [id number]  cc: Krystal Eaton, MD, <Dictator> Sheikh A. Ellsworth Lennox, MD Laurier Nancy, MD Marcelle Smiling [facility] MD ELECTRONICALLY SIGNED 12/31/2012 12:29

## 2014-11-20 NOTE — Consult Note (Signed)
PATIENT NAME:  Heather Norman, Heather Norman MR#:  [id number] DATE OF BIRTH:  Jun 13, 1976  DATE OF CONSULTATION:  12/12/2012  REFERRING PHYSICIAN:   CONSULTING PHYSICIAN:  Laurier Nancy, MD  This is a 39 year old African American female with a past medical history of preeclampsia during pregnancy with uncontrolled hypertension in February 2013, who comes in with symptoms of shortness of breath, PND, orthopnea, and leg swelling for the past couple of weeks associated with a 10 pound weight gain. She was in hypertensive emergency when she came in. She got IV labetalol and Lasix in the Emergency Room.   MEDICATIONS: Amlodipine 10 mg, aspirin 81, chlorthalidone 25 mg and Diovan 320.   ALLERGIES: PENICILLIN.   PAST MEDICAL HISTORY: History of hypertension and hyperlipidemia.   FAMILY HISTORY: Unremarkable.   SOCIAL HISTORY: She denies EtOH abuse or smoking.   PHYSICAL EXAMINATION:  VITAL SIGNS: Blood pressure right now is elevated to 170/101, respirations 23, pulse 86, and saturation 86.  HEENT: 8 cm JVD.  LUNGS: There is crepitation at the bases.  HEART: Regular rate and rhythm. Normal S1, S2. No audible murmur.  ABDOMEN: Soft, nontender, positive bowel sounds.  EXTREMITIES: 1+ pedal edema.  NEUROLOGIC: She appears to be intact.   LABS/STUDIES: EKG shows normal sinus rhythm, 80 beats per minute and LVH. Labs are unremarkable. BUN and creatinine are normal.   ASSESSMENT AND PLAN: The patient has hypertensive emergency leading to congestive heart failure, thus the patient has hypertensive heart disease. Advised starting the patient on labetalol 300 mg b.i.d., add diuretic of hydrochlorothiazide 25 mg and continue Diovan. Advise getting an echocardiogram and transfer the patient to regular floor or unit telemetry. Discontinue nicardipine drip. If the blood pressure is still not controlled, advise adding Cardizem CD 180 or320. Thank you very much for the referral.  ____________________________ Laurier Nancy, MD sak:aw D: 12/13/2012 09:12:44 ET T: 12/13/2012 09:48:46 ET JOB#: [id number]  cc: Laurier Nancy, MD, <Dictator> Laurier Nancy MD ELECTRONICALLY SIGNED 12/28/2012 13:21

## 2014-11-20 NOTE — Discharge Summary (Signed)
**Note De-Identified via Obfucation** PATIENT NAME:  Heather Norman, Heather Norman MR#:  [id number] DATE OF BIRTH:  February 22, 1976  DATE OF ADMISSION:  12/12/2012 DATE OF DISCHARGE:  12/16/2012   ADMITTING PHYSICIAN: Krytal Eaton, MD  PRIMARY CARE PHYSICIAN: Sila Flood. Ellworth Lennox, MD  DISCHARGE DIAGNOSES:   1.  Acute diatolic congetive heart failure.  2.  Malignant hypertenion.  3.  Acute kidney injury.  4.  Morbid obeity.   CONSULTATIONS: Cardiology, Dr. Adrian Blackwater.   RADIOLOGY PROCEDURES:  1.  A 2-D echocardiogram.  2.  Chet x-ray twice, PA and lateral.   DISCHARGE MEDICATIONS: Diovan 320 mg take 1/2 tablet daily, Trandate 200 mg b.i.d., chlorthalidone 25 mg daily, amlodipine 10 mg daily, apirin 81 mg daily, multivitamin daily, Zyrtec 10 mg daily, Oxytrol trandermal patch 3 time a week.   HOSPITAL COURSE: Thi lady wa admitted through the Emergency Room with marked hortne of breath and orthopnea. Pleae ee hitory and phyical for full detail. Clinical impreion of flah pulmonary edema due to  malignant hypertenion wa made. The patient wa admitted to the CCU where he wa initiated on nicardipine drip for blood preure control and  intravenou diuretic to addre pulmonary edema. The patient  admitted to medication noncompliance reulting in her malignant hypertenion. Her blood preure normalized within  about 24 hour. She wa tranferred to a monitored bed. Her 2-D echocardiogram revealed marked left ventricular hypertrophy with marked reduction in left ventricular cavity ize. She wa tarted on oral Cardizem in addition to labetalol, amlodipine and diuretic. Her blood preure improved. However, he did utain acute kidney injury mot likely econdary to  overdiurei. Her loop diuretic were dicontinued. The patient received a mall amount of fluid with improvement in creatinine from  1.68 to 1.5 without any evidence of oliguria or potural dizzine. The patient' blood preure at dicharge wa 118/70 and he  i being dicharged to home with home health in atifactory condition.   DISCHARGE INSTRUCTIONS:   DIET: Low-odium, low-fat, low-choleterol, fat-controlled control to promote weight lo.   ACTIVITY: A tolerated.   FOLLOWUP: In 1 to 2 day with Dr. Ellworth Lennox,  in 1 to 2 week with Dr. Adrian Blackwater of cardiology.   DISCHARGE PROCESS TIME SPENT: 35 minute.   ____________________________ Sila Flood Ellworth Lennox, MD at:jm D: 12/16/2012 13:56:14 ET T: 12/16/2012 14:48:08 ET JOB#: [id number]  cc: Marland Mcalpine A. Ellworth Lennox, MD, <Dictator> Charleetta Garibaldi MD ELECTRONICALLY SIGNED 12/24/2012 13:23

## 2014-11-22 NOTE — Op Note (Signed)
PATIENT NAME:  Heather Norman, Heather Norman MR#:  [id number] DATE OF BIRTH:  05-20-1976  DATE OF PROCEDURE:  12/29/2011  PREOPERATIVE DIAGNOSIS: Desires sterilization.   POSTOPERATIVE DIAGNOSIS: Desires sterilization.     PROCEDURE: Laparoscopic tubal cautery.   SURGEON: Ricky L. Logan Bores, MD.   ANESTHESIA: General endotracheal.   FINDINGS: Morbidly obese abdomen, grossly normal tubes, uterus, and ovaries.  ESTIMATED BLOOD LOSS: Minimal.  COMPLICATIONS: None.  DRAINS: In and out catheter with a red rubber catheter at the beginning of the procedure.   PROCEDURE IN DETAIL: The patient is approximately five weeks postpartum and desired postpartum sterilization. Due to morbid obesity, it was elected to proceed with interval. Consent was signed. She was taken to the Operating Room and placed in the supine position where anesthesia was initiated. She was then placed in the dorsal lithotomy position using Allen stirrups, prepped and draped in the usual sterile fashion. The cervix was visualized and grasped with a single-tooth tenaculum. Hulka tenaculum was placed and the bladder was drained.   An 11 port was placed infraumbilically, pneumoperitoneum was established and under direct visualization, laparoscopic tubal cautery was carried out in the ampullary region of the fallopian tubes bilaterally over an approximately 2.5 cm coarse. The patient tolerated the procedure well. All instrument, needle, and sponge counts were correct. Areas were hemostatic as pneumoperitoneum was evacuating. I anticipate a routine postoperative course.   ____________________________ Reatha Harps. Logan Bores, MD rle:ap D: 12/29/2011 08:01:30 ET                 T: 12/29/2011 28:11:88 ET JOB#: [id number] cc: Clide Cliff L. Logan Bores, MD, <Dictator> Augustina Mood MD ELECTRONICALLY SIGNED 12/29/2011 16:28

## 2014-11-22 NOTE — Consult Note (Signed)
PATIENT NAME:  Heather Norman, Heather Norman MR#:  [id number] DATE OF BIRTH:  1976/04/13  DATE OF CONSULTATION:  09/12/2011  REFERRING PHYSICIAN:  Maisie Fus Schermerhorn  CONSULTING PHYSICIAN:   A. Lafayette Dragon, MD  REFERRING PHYSICIAN: Dr. Jens Som   PRIMARY CARE PHYSICIAN: Dr. Ellsworth Lennox   PRIMARY OB/GYN: In [facility]   REASON FOR CONSULTATION: Shortness of breath, malignant hypertension.   HISTORY OF PRESENT ILLNESS: This is a 39 year old African American female with history of hypertension and obesity who is [redacted] weeks pregnant with her second child who over the last day had shortness breath. She said she had a coughing spell and then became short of breath. She came in the ER, was desating at 88% on room air. She said that she has noticed no edema. No chest pain. No nausea, vomiting, diarrhea, fevers, chills, or shakes. She has not been on any long travels or bedbound. She had been working as a Associate Professor. The patient had chest x-ray which was consistent with pulmonary vascular congestion and systolic blood pressure greater than 190 when she presented. She was given labetalol. We are asked to see the patient in consultation for possible perinatal cardiomyopathy/CHF.   PAST MEDICAL HISTORY: Hypertension.  MEDICATIONS: 1. Labetalol 300 mg b.i.d. 2. Prenatal vitamins. 3. Procardia 30 mg daily.  4. Zyrtec daily.  PAST SURGICAL HISTORY: Cerclage.   DRUG ALLERGIES: Penicillin.   FAMILY HISTORY: Mother is living with hypertension. Father is living with hypertension. She is single with two healthy children. She does not smoke, drink, or use illicit drugs. She works as a Associate Professor.  REVIEW OF SYSTEMS: CONSTITUTIONAL: No fever, fatigue, weakness. EYES: No blurred vision, double vision, pain, redness, inflammation, glaucoma. ENT: No tinnitus, ear pain, hearing loss, seasonal allergies, epistaxis, discharge. RESPIRATORY: Cough but no wheezing, hemoptysis. She has dyspnea but no asthma or painful  respirations. CARDIOVASCULAR: She has chest pain with deep coughing but no orthopnea, edema, arrhythmia, dyspnea on exertion, palpitations, syncope. GI: No nausea, vomiting, diarrhea, abdominal pain, hematemesis, melena, gastroesophageal reflux disease. GU: No dysuria, hematuria, renal calculi, frequency, incontinence. GYN: No breast mass, tenderness, or vaginal discharge. She is pregnant. ENDOCRINE: No polyuria, nocturia, thyroid problems, increased sweating, heat or cold intolerance. HEME/LYMPH: No anemia, easy bruising, swollen glands. INTEGUMENTARY: No acne, rash, change in mole, hair or skin. MUSCULOSKELETAL: No pain in back, shoulders, knees, hips, swelling, gout. NEUROLOGIC: No numbness, weakness, epilepsy, tremor, vertigo, anxiety. PSYCH: No anxiety, insomnia, ADD, bipolar, depression.   PHYSICAL EXAMINATION:   VITAL SIGNS: Temperature 96.4, heart rate 154, respiratory rate 30, blood pressure 158/88, sating 88% on room air.   GENERAL: The patient is well developed, well nourished, high BMI. She is mildly tachypneic.   HEENT: Pupils equal and reactive to light and accommodation. Extraocular movements intact. Anicteric sclerae. No difficulty hearing. Oropharynx is clear.   NECK: No JVD, no thyromegaly, no lymphadenopathy, no carotid bruits.   LUNGS: Bibasilar crackles. Mild use of accessory muscles and increased effort, resonant to percussion.   CARDIOVASCULAR: Tachycardic. Normal S1, S2. No murmurs, gallops, or rubs appreciated. Trace lower extremity edema. 1+ dorsalis pedis pulses.   BREASTS: Deferred.   ABDOMEN: Soft, distended. Normal bowel sounds. Pregnant consistent with [redacted] week gestation.   GU: Deferred.   MUSCULOSKELETAL: Strength 5/5. No clubbing, cyanosis, or degenerative joint disease.   SKIN: No rash, lesions, or induration.   LYMPH: No lymphadenopathy in the cervical or supraclavicular area.   NEUROLOGIC: Cranial nerves II through XII are intact. No focal deficits.  Follows commands. No  dysarthria, aphasia, dysphagia.   PSYCH: Alert and oriented x3 with good judgment.   LABORATORY, DIAGNOSTIC, AND RADIOLOGICAL DATA: Glucose 90, BUN 8, creatinine 0.63, sodium 142, potassium 3.8, chloride 106, bicarb 23, anion gap 13, total protein 7.0, albumin 2.7, total bilirubin 0.3, alkaline phosphatase 88, AST 23, ALT 24. CK 308. MB 4.5. Troponin less than 0.02. White count 12.3, hemoglobin 11.4, hematocrit 34, platelets 293, MCV 87. Chest x-ray shows pulmonary vascular congestion. EKG shows sinus tachycardia. No ST changes.   ASSESSMENT AND RECOMMENDATIONS: This is a 39 year old African American female with history of hypertension, 28 week pregnancy presenting with shortness of breath.  1. Shortness of breath, respiratory failure. Will check Doppler's. Check ABG. I do not think she has PE even though she's tachycardic as she is mobile but she does have high risk with pregnancy. At least will check Doppler's. Will not check D-dimer as I know it will be elevated because of pregnancy.  2. Malignant hypertension. Will give p.r.n. hydralazine. Continue labetalol. Will get Cardiology consultation and echo Doppler.  3. 28 week pregnancy. Will continue prenatal vitamins as per OB/GYN.  4. Leukocytosis, anemia. This most likely is due to pregnancy.  5. Obesity. Counseled about diet and exercise.  6. CODE STATUS. FULL CODE. 7. DVT prophylaxis. Maintained with TED stockings and SCDs.   TOTAL TIME SPENT ON CONSULTATION: 50 minutes.     I discussed with Dr. Ellsworth Lennox and Dr. Adrian Blackwater who will follow with OB/GYN.   ____________________________ Corie Chiquito. Lafayette Dragon, MD aaf:drc D: 09/12/2011 15:07:09 ET T: 09/12/2011 15:23:12 ET JOB#: [id number]  cc: Karolee Ohs A. Lafayette Dragon, MD, <Dictator> Suzy Bouchard, MD Silas Flood. Ellsworth Lennox, MD Laurier Nancy, MD Karolee Ohs Laverda Page MD ELECTRONICALLY SIGNED 09/13/2011 10:59

## 2014-11-22 NOTE — Consult Note (Signed)
Chief Complaint:   Subjective/Chief Complaint " I feel better". Less SOB, denies chest pain. BP has improved today.   VITAL SIGNS/ANCILLARY NOTES: **Vital Signs.:   13-Feb-13 08:30   Vital Signs Type Routine   Temperature Temperature (F) 96.4   Celsius 35.7   Respirations Respirations 18   Systolic BP Systolic BP 146   Diastolic BP (mmHg) Diastolic BP (mmHg) 78   Mean BP 100   BP Source Dinamap   Pulse Ox % Pulse Ox % 100   Pulse Ox Activity Level  At rest   Oxygen Delivery 2L  *Intake and Output.:   13-Feb-13 05:41   Grand Totals Intake:   Output:      Net:   24 Hr.:      08:31   Grand Totals Intake:  150 Output:  200    Net:  -50 24 Hr.:  -50   Oral Intake      In:  150   Urine ml     Out:  200   Urinary Method  Void   Brief Assessment:   Cardiac Regular  murmur present  + LE edema  -- JVD    Respiratory crackles  left base    Gastrointestinal Normal    Gastrointestinal details normal Nontender    Additional Physical Exam Gravid   Cardiac:  13-Feb-13 04:48    CK, Total 403   CPK-MB, Serum 2.9  Routine Hem:  13-Feb-13 04:48    WBC (CBC) 12.9   RBC (CBC) 3.81   Hemoglobin (CBC) 11.1   Hematocrit (CBC) 33.0   Platelet Count (CBC) 294   MCV 87   MCH 29.1   MCHC 33.6   RDW 14.8  Cardiac:  13-Feb-13 04:48    Troponin I < 0.02  Thyroid:  13-Feb-13 04:48    Thyroid Stimulating Hormone 3.72  Routine Chem:  13-Feb-13 04:48    Uric Acid, Serum 4.3  Routine Hem:  13-Feb-13 04:48    Neutrophil % 78.7   Lymphocyte % 13.4   Monocyte % 5.4   Eosinophil % 0.8   Basophil % 1.7   Neutrophil # 10.2   Lymphocyte # 1.7   Monocyte # 0.7   Eosinophil # 0.1   Basophil # 0.2   Assessment/Plan:  Assessment/Plan:   Assessment Acute Diastolic CHF-improving, ct diuresis Malignant Hypertension-bp has improved, ct rx Gravida-no evidence of pre-eclampsia, ob following. 24 hr urine protein collection as outpt   Electronic Signatures: Charlesetta Garibaldi (MD)   (Signed 13-Feb-13 14:08)  Authored: Chief Complaint, VITAL SIGNS/ANCILLARY NOTES, Brief Assessment, Lab Results, Assessment/Plan   Last Updated: 13-Feb-13 14:08 by Charlesetta Garibaldi (MD)

## 2014-11-22 NOTE — Consult Note (Signed)
PATIENT NAME:  Heather Norman, Heather Norman MR#:  [id number] DATE OF BIRTH:  [birth year]  DATE OF CONSULTATION:  09/12/2011  REFERRING PHYSICIAN:   CONSULTING PHYSICIAN:  Laurier Nancy, MD  HISTORY OF PRESENT ILLNESS: This is a 39 year old African American female who came into the hospital because of shortness of breath, swelling of the legs for the past two weeks, PND, and orthopnea. She had elevated blood pressure during her entire pregnancy. She has been on labetalol 300 b.i.d. and Procardia XL 30 mg p.o. daily. She developed shortness of breath, PND and orthopnea for one week. Since yesterday she has been really short of breath, unable to work at [employer], thus came into the Emergency Room. She denied any chest pain. She did have some palpitations and blood pressure was 175/80 and pulse was 120 when she came in in sinus tachycardia. At this time she is feeling somewhat better, less short of breath but still feels quite congested.   PAST MEDICAL HISTORY:  1. History of hypertension.  2. History of hypercholesterolemia. She used to be on simvastatin which was held after she became pregnant.   SOCIAL HISTORY: Denies EtOH abuse or smoking.   FAMILY HISTORY: Unremarkable.   PHYSICAL EXAMINATION:   GENERAL: She is alert, oriented x3 in no acute distress.   VITAL SIGNS: Blood pressure 170/80, pulse 120, respirations 18, afebrile.   HEENT: 8 cm JVD.   LUNGS: Few crackles at the bases.   HEART: Sinus tachycardia. Normal S1, S2. No audible murmur.   ABDOMEN: Soft distended. Positive bowel sounds.   EXTREMITIES: 1+ pedal edema.   LABORATORY, DIAGNOSTIC, AND RADIOLOGICAL DATA: EKG shows sinus rhythm, sinus tachycardia, 116 beats per minute, mild LVH, nonspecific ST-T changes.   BUN and creatinine are normal. Her hemoglobin is 11.4. CPK was 308, MB fraction 4.5. BNP 54.   ASSESSMENT AND PLAN: Uncontrolled hypertension with sinus tachycardia, mild diastolic dysfunction causing the heart  failure. Echocardiogram showed EF 57%, mild diastolic dysfunction, moderate LVH, mild to moderate mitral regurgitation. Advised controlling blood pressure by adding hydralazine and also advised getting her heart rate down. She is still tachycardic. Also give Lasix to diurese the patient. She does not have any evidence of peripartum cardiomyopathy.   Thank you very much for the referral.   ____________________________ Laurier Nancy, MD sak:drc D: 09/12/2011 15:19:20 ET T: 09/12/2011 15:38:32 ET JOB#: [id number]  cc: Laurier Nancy, MD, <Dictator> Laurier Nancy MD ELECTRONICALLY SIGNED 09/20/2011 11:22

## 2014-12-08 NOTE — H&P (Signed)
L&D Evaluation:  History:   HPI 36 y/u BF 33 weeks cHTN; BP elevated in office    Presents with elevated BP    Patient's Medical History Hypertension    Patient's Surgical History cerclage    Medications Pre [place] Vitamins  labetalol    Social History none    Family History Non-Contributory   ROS:   ROS am H/A's only   Exam:   Vital Signs stable    Urine Protein trace    General no apparent distress    Mental Status clear    Heart normal sinus rhythm    Abdomen gravid, non-tender    Estimated Fetal Weight Average for gestational age    Reflexes 1+    Clonus negative    FHT normal rate with no decels    Ucx absent    Other Labs WNL except mildly elevated pro/cr ratio   Impression:   Impression cHTN   Plan:   Comments Home with 24 urine collection (baseline 244mg  protein) Stop work F/U [facility] for evaluation as scheduled (2/wk NST)   Electronic Signatures: Margaretha Glassing (MD)  (Signed 19-Mar-13 12:41)  Authored: L&D Evaluation   Last Updated: 19-Mar-13 12:41 by Margaretha Glassing (MD)

## 2014-12-08 NOTE — H&P (Signed)
L&D Evaluation:  History:   HPI 39 y/o BF G4P2 h/o PTL; s/p cerclage removal    Presents with abdominal pain, contractions    Patient's Medical History Hypertension    Patient's Surgical History cerclage    Medications Pre Natal Vitamins  labetalol, procardia    Allergies NKDA    Social History none    Family History Non-Contributory   ROS:   ROS abd pains, increasing frequency since early am   Exam:   Vital Signs BP 13o's/80's    General no apparent distress    Abdomen gravid, tender with contractions    Estimated Fetal Weight Average for gestational age    Back no CVAT    Edema 2+    Reflexes 2+    Clonus negative    Pelvic 1.5/75/post, high    Mebranes Intact    FHT normal rate with no decels    Ucx regular    Ucx Frequency 4 min    Skin dry   Impression:   Impression early labor, evaluation for PIH   Plan:   Comments Preeclampsia W/U Labor check   Electronic Signatures: Margaretha Glassing (MD)  (Signed 18-Apr-13 12:16)  Authored: L&D Evaluation   Last Updated: 18-Apr-13 12:16 by Margaretha Glassing (MD)

## 2014-12-08 NOTE — H&P (Signed)
L&D Evaluation:  History:   HPI 38 yo G3PO3 with LMP of 02/22/11 & EDd of 12/02/11 & [place] date with EDD of 11/26/11 presents to ER today after having "SOB this am at 1000, difficulty catching her breath, mild fluid in lower extrems, No VB, LOF, UC's, decreased FM. Pt has OB provider at [facility] in [place], [place]. No hx of asthma, TB, palpitations, CP or cardiac S/S. SaO2 in ER was 86% and now on O2 2l/[place] SaO2 of 95-96%. Dr Welton Flakes and [facility] have eval pt in ER and ECHO showed lVH. No cardiomyopathy noted.    Presents with SOB    Patient's Medical History Hypertension  Cerclage x 3 pregancies for Incompetent Cx    Medications Pre [place] Vitamins    Social History none    Family History Non-Contributory   ROS:   ROS All systems were reviewed.  HEENT, CNS, GI, GU, Respiratory, CV, Renal and Musculoskeletal systems were found to be normal.   Exam:   Vital Signs BP 170/100's  P110  R27    General no apparent distress    Mental Status clear    Chest clear  Rales noted in the bases bilat, No wheezing.    Heart Grade III/VI systolic murmur    Abdomen gravid, non-tender    Estimated Fetal Weight Average for gestational age    Back no CVAT    Edema 1+  Pre-tibial    Pelvic not eval in ER    Mebranes Intact    Skin Back hot and sl diaphoretic    Lymph no lymphadenopathy   Impression:   Impression SOB due to HTN   Plan:   Plan EFM/NST    Comments Pt is enroute to [place] first for eval  ordered by [employer]   Electronic Signatures: Sharee Pimple (CNM)  (Signed 12-Feb-13 16:13)  Authored: L&D Evaluation   Last Updated: 12-Feb-13 16:13 by Sharee Pimple (CNM)

## 2014-12-17 DIAGNOSIS — R748 Abnormal levels of other serum enzymes: Secondary | ICD-10-CM | POA: Insufficient documentation

## 2015-01-14 ENCOUNTER — Other Ambulatory Visit (INDEPENDENT_AMBULATORY_CARE_PROVIDER_SITE_OTHER): Payer: Medicaid Other

## 2015-01-14 DIAGNOSIS — R0602 Shortness of breath: Secondary | ICD-10-CM | POA: Diagnosis not present

## 2015-01-15 LAB — BASIC METABOLIC PANEL
BUN/Creatinine Ratio: 9 (ref 9–23)
BUN: 9 mg/dL (ref 6–24)
CHLORIDE: 104 mmol/L (ref 97–108)
CO2: 19 mmol/L (ref 18–29)
Calcium: 9.2 mg/dL (ref 8.7–10.2)
Creatinine, Ser: 0.96 mg/dL (ref 0.57–1.00)
GFR calc Af Amer: 86 mL/min/{1.73_m2} (ref >59)
GFR, EST NON AFRICAN AMERICAN: 74 mL/min/{1.73_m2} (ref >59)
GLUCOSE: 104 mg/dL — AB (ref 65–99)
POTASSIUM: 4.1 mmol/L (ref 3.5–5.2)
Sodium: 142 mmol/L (ref 134–144)

## 2015-01-15 LAB — LIPID PANEL
CHOL/HDL RATIO: 4 ratio (ref 0.0–4.4)
Cholesterol, Total: 168 mg/dL (ref 100–199)
HDL: 42 mg/dL (ref >39)
LDL Calculated: 106 mg/dL — ABNORMAL HIGH (ref 0–99)
TRIGLYCERIDES: 102 mg/dL (ref 0–149)
VLDL Cholesterol Cal: 20 mg/dL (ref 5–40)

## 2015-01-15 LAB — HEPATIC FUNCTION PANEL
ALT: 14 IU/L (ref 0–32)
AST: 18 IU/L (ref 0–40)
Albumin: 3.8 g/dL (ref 3.5–5.5)
Alkaline Phosphatase: 139 IU/L — ABNORMAL HIGH (ref 39–117)
BILIRUBIN TOTAL: 0.3 mg/dL (ref 0.0–1.2)
BILIRUBIN, DIRECT: 0.11 mg/dL (ref 0.00–0.40)
Total Protein: 6.9 g/dL (ref 6.0–8.5)

## 2015-01-21 ENCOUNTER — Telehealth: Payer: Self-pay | Admitting: Cardiovascular Disease

## 2015-01-21 NOTE — Telephone Encounter (Signed)
Lab results reviewed with patient who verbalized understanding and states PCP is aware of elevated alkaline phosphatase and were awaiting our results because it was elevated in the past.  I routed a copy to her PCP, Loney Laurence, FNP per her request.

## 2015-01-21 NOTE — Telephone Encounter (Signed)
New message      Returning Heather Norman's call regading results.  Please advise

## 2015-02-16 ENCOUNTER — Encounter: Payer: Self-pay | Admitting: Cardiovascular Disease

## 2015-02-16 ENCOUNTER — Ambulatory Visit (INDEPENDENT_AMBULATORY_CARE_PROVIDER_SITE_OTHER): Payer: Medicaid Other | Admitting: Cardiovascular Disease

## 2015-02-16 VITALS — BP 140/98 | HR 65 | Ht 66.0 in | Wt 279.0 lb

## 2015-02-16 DIAGNOSIS — I10 Essential (primary) hypertension: Secondary | ICD-10-CM

## 2015-02-16 DIAGNOSIS — I5032 Chronic diastolic (congestive) heart failure: Secondary | ICD-10-CM | POA: Diagnosis not present

## 2015-02-16 DIAGNOSIS — R0600 Dyspnea, unspecified: Secondary | ICD-10-CM | POA: Diagnosis not present

## 2015-02-16 MED ORDER — AMLODIPINE BESYLATE 5 MG PO TABS: 5.0000 mg | ORAL_TABLET | Freq: Every day | ORAL | Status: DC

## 2015-02-16 NOTE — Progress Notes (Signed)
Heather Norman Date of Birth  1976/04/19       [facility]    [place] [address]  [address], [address] [place], [place]  [address]   [place], [place]  [address] [phone]     [phone]   Fax  [phone]     Fax [phone]  Problem List: 1. Hypertension 2. Congestive heart failure- 3. Diabetes Mellitus   History of Present Illness:  Heather Norman is a 40 yo with the dx of CHF.  She has seen Dr. Welton Flakes.  She was diagnosed with CHF in the middle of her pregnancy.  She changed doctors because of her changed of insurance.   Recently, she has had some leg swelling and hand swelling. She has DOE with everyday chores ( making the beds).  She walks occasionally.  Still not able to go over 20 minutes at a time .   She had an echo in late 2014 [facility])   She tries to watch her salt.  Uses Ms. Dash.    Oct. 2, 2015:  Heather Norman is doing better.  We changed her tenoretic to Coreg and Lasix.   Dec. 4, 2015:  Feels better.  She is tolerating the coreg and lasix well.   Echo :  Shows normal LV systolic and diastolic  function , mild MR.  February 16, 2015:  Heather Norman is dong well. Has joined [facility] and is exercising.   BP is still high. Has been avoiding salt .     Current Outpatient Prescriptions on File Prior to Visit  Medication Sig Dispense Refill  . aspirin 81 MG tablet Take 1 tablet (81 mg total) by mouth daily. 30 tablet   . atorvastatin (LIPITOR) 20 MG tablet Take 20 mg by mouth daily.    . cetirizine (ZYRTEC) 10 MG tablet Take 10 mg by mouth daily.    . ferrous sulfate 325 (65 FE) MG tablet Take 325 mg by mouth 2 (two) times daily with a meal.     . fluticasone (VERAMYST) 27.5 MCG/SPRAY nasal spray Place 2 sprays into the nose daily.    . furosemide (LASIX) 40 MG tablet Take 1 tablet (40 mg total) by mouth 2 (two) times daily. 60 tablet 6  . lisinopril (PRINIVIL,ZESTRIL) 40 MG tablet Take 40 mg by mouth daily.    . metFORMIN  (GLUCOPHAGE) 500 MG tablet Take 500 mg by mouth 2 (two) times daily with a meal.    . oxybutynin (DITROPAN-XL) 5 MG 24 hr tablet Take 5 mg by mouth 2 (two) times daily.    . pantoprazole (PROTONIX) 40 MG tablet Take 40 mg by mouth daily.    . potassium chloride SA (KLOR-CON M20) 20 MEQ tablet Take 1 tablet (20 mEq total) by mouth 2 (two) times daily. 60 tablet 3   No current facility-administered medications on file prior to visit.    Allergies  Allergen Reactions  . Penicillins Anaphylaxis    Past Medical History  Diagnosis Date  . Hypertension   . Heart murmur   . Hyperlipidemia     no meds since pregnancy  . Shortness of breath     with exercise  . Headache(784.0)     otc med prn  . Arthritis     kness/hands - no meds  . Obesity   . CHF (congestive heart failure)   . Arrhythmia   . Diabetes mellitus without complication     Past Surgical History  Procedure Laterality Date  . Cervical cerclage  06/01/2011    Procedure: CERCLAGE CERVICAL;  Surgeon: Philip Aspen, DO;  Location: [facility];  Service: Gynecology;  Laterality: N/A;  REQUESTING PORTABLE ULTARSOUND AT BEDSIDE PT'S EDC:12/02/2011  . Svd      x 2  . Endosocpy    . Tab      x 1  . Cervical cerclage  06/01/2011    Procedure: CERCLAGE CERVICAL;  Surgeon: Philip Aspen, DO;  Location: [facility];  Service: Gynecology;  Laterality: N/A;  REQUESTING PORTABLE ULTARSOUND AT BEDSIDE PT'S EDC:12/02/2011    History  Smoking status  . Never Smoker   Smokeless tobacco  . Never Used    History  Alcohol Use No    Family History  Problem Relation Age of Onset  . Stroke Mother   . Hypertension Mother   . Hyperlipidemia Mother   . Hypertension Father     Reviw of Systems:  Reviewed in the HPI.  All other systems are negative.  Physical Exam: Blood pressure 140/98, pulse 65, height  (1.676 m), weight 126.554 kg (279 lb). Wt Readings from Last 3 Encounters:  02/16/15 126.554 kg (279 lb)  07/03/14 126.78  kg (279 lb 8 oz)  05/01/14 127.007 kg (280 lb)     General: Well developed, well nourished, in no acute distress.  Head: Normocephalic, atraumatic, sclera non-icteric, mucus membranes are moist,   Neck: Supple. Carotids are 2 + without bruits. No JVD   Lungs: Clear   Heart: RR, normla s1s2, no murmur, no gallop  Abdomen: Soft, non-tender, non-distended with normal bowel sounds.  Msk:  Strength and tone are normal   Extremities: No clubbing or cyanosis. No edema.  Distal pedal pulses are 2+ and equal    Neuro: CN II - XII intact.  Alert and oriented X 3.   Psych:  Normal  her ECG: February 16, 2015:  NSR , NS ST abn   Assessment / Plan:    1. Hypertension -   BP is still elevated.  Will add amlodipine . Continue other meds.  Will try aldactone at her next visit if the BP is still elevated. Will follow up with her in 3 months   2. Shortness of breath :    Normal LV systolic and diastolic function .  I suspect her shortness of breath is due to her obesity .    Echo shows: Left ventricle: The cavity size was normal. Systolic function was normal. The estimated ejection fraction was in the range of 60% to 65%. Wall motion was normal; there were no regional wall motion abnormalities. Left ventricular diastolic function parameters were normal. - Mitral valve: There was mild regurgitation. - Left atrium: The atrium was mildly dilated.  Nahser, Deloris Ping, MD  02/16/2015 12:23 PM    [facility] [address] [place],  [address] [place], [place]  [address] Pager [phone] Phone: [phone]; Fax: [phone]   [facility]  [address] [place], [place]  [address] [phone]    Fax [phone]  - Right ventricle: Systolic function was normal. - Pulmonary arteries: Systolic pressure was within the normal range.  3. Diabetes Mellitus - followed by her primary medical doctor

## 2015-02-16 NOTE — Patient Instructions (Signed)
Medication Instructions:  Your physician has recommended you make the following change in your medication:  START taking amlodipine 5mg  once per day   Labwork: none  Testing/Procedures: non  Follow-Up: Your physician recommends that you schedule a follow-up appointment in: three months with Dr. Elease Hashimoto   Any Other Special Instructions Will Be Listed Below (If Applicable).

## 2015-03-11 DIAGNOSIS — F4321 Adjustment disorder with depressed mood: Secondary | ICD-10-CM | POA: Insufficient documentation

## 2015-03-11 DIAGNOSIS — Z1389 Encounter for screening for other disorder: Secondary | ICD-10-CM | POA: Insufficient documentation

## 2015-06-07 DIAGNOSIS — R195 Other fecal abnormalities: Secondary | ICD-10-CM | POA: Insufficient documentation

## 2015-06-23 ENCOUNTER — Ambulatory Visit: Payer: Medicaid Other | Admitting: Cardiovascular Disease

## 2015-08-30 ENCOUNTER — Ambulatory Visit (INDEPENDENT_AMBULATORY_CARE_PROVIDER_SITE_OTHER): Payer: Medicaid Other | Admitting: Cardiovascular Disease

## 2015-08-30 ENCOUNTER — Encounter: Payer: Self-pay | Admitting: Cardiovascular Disease

## 2015-08-30 VITALS — BP 158/100 | HR 71 | Ht 66.0 in | Wt 276.5 lb

## 2015-08-30 DIAGNOSIS — I10 Essential (primary) hypertension: Secondary | ICD-10-CM | POA: Diagnosis not present

## 2015-08-30 DIAGNOSIS — R0600 Dyspnea, unspecified: Secondary | ICD-10-CM | POA: Diagnosis not present

## 2015-08-30 DIAGNOSIS — R0602 Shortness of breath: Secondary | ICD-10-CM

## 2015-08-30 DIAGNOSIS — I5032 Chronic diastolic (congestive) heart failure: Secondary | ICD-10-CM

## 2015-08-30 MED ORDER — LOSARTAN POTASSIUM 100 MG PO TABS: 100.0000 mg | ORAL_TABLET | Freq: Every day | ORAL | Status: DC

## 2015-08-30 NOTE — Progress Notes (Signed)
Patient ID: Heather Norman, female    DOB: [birth year], 40 y.o.   MRN: [id number]  HPI Comments: Heather Norman a pleasant 40 year old woman with morbid obesity, diabetes type 2, hypertension, chronic diastolic CHF, who presents for routine office visit for her diastolic CHF and shortness of breath.  She reports that she is taking Lasix twice per day, has urinary incontinence issues Take potassium twice a day, no recent lab work  Denies having Cough which is her usual symptom for diastolic CHF Weight continues to be a problem, she is not been exercising, previously went to the [facility] Blood pressure is always run high for the patient Today 160/100 She stopped lisinopril on her own, she was worried about angioedema  She reports shortness of breath when she walks up hills, but better on the flat  Prior echocardiogram 2015 showing normal LV function, mild MR  EKG on today's visit shows normal sinus rhythm with rate 71 bpm, T-wave abnormality anterolateral leads   Allergies  Allergen Reactions  . Penicillins Anaphylaxis    Current Outpatient Prescriptions on File Prior to Visit  Medication Sig Dispense Refill  . amLODipine (NORVASC) 5 MG tablet Take 1 tablet (5 mg total) by mouth daily. 30 tablet 3  . aspirin 81 MG tablet Take 1 tablet (81 mg total) by mouth daily. 30 tablet   . atorvastatin (LIPITOR) 20 MG tablet Take 20 mg by mouth daily.    . carvedilol (COREG) 25 MG tablet Take 25 mg by mouth 2 (two) times daily with a meal.    . cetirizine (ZYRTEC) 10 MG tablet Take 10 mg by mouth daily.    . ferrous sulfate 325 (65 FE) MG tablet Take 325 mg by mouth 2 (two) times daily with a meal.     . fluticasone (VERAMYST) 27.5 MCG/SPRAY nasal spray Place 2 sprays into the nose daily.    . furosemide (LASIX) 40 MG tablet Take 1 tablet (40 mg total) by mouth 2 (two) times daily. 60 tablet 6  . metFORMIN (GLUCOPHAGE) 500 MG tablet Take 500 mg by mouth 2 (two) times daily with a meal.    .  oxybutynin (DITROPAN-XL) 5 MG 24 hr tablet Take 5 mg by mouth 2 (two) times daily.    . pantoprazole (PROTONIX) 40 MG tablet Take 40 mg by mouth daily.    . potassium chloride SA (KLOR-CON M20) 20 MEQ tablet Take 1 tablet (20 mEq total) by mouth 2 (two) times daily. 60 tablet 3   No current facility-administered medications on file prior to visit.    Past Medical History  Diagnosis Date  . Hypertension   . Heart murmur   . Hyperlipidemia     no meds since pregnancy  . Shortness of breath     with exercise  . Headache(784.0)     otc med prn  . Arthritis     kness/hands - no meds  . Obesity   . CHF (congestive heart failure) (HCC)   . Arrhythmia   . Diabetes mellitus without complication [facility])     Past Surgical History  Procedure Laterality Date  . Cervical cerclage  06/01/2011    Procedure: CERCLAGE CERVICAL;  Surgeon: Philip Aspen, DO;  Location: [facility];  Service: Gynecology;  Laterality: N/A;  REQUESTING PORTABLE ULTARSOUND AT BEDSIDE PT'S EDC:12/02/2011  . Svd      x 2  . Endosocpy    . Tab      x 1  . Cervical cerclage  06/01/2011  Procedure: CERCLAGE CERVICAL;  Surgeon: Philip Aspen, DO;  Location: [facility];  Service: Gynecology;  Laterality: N/A;  REQUESTING PORTABLE ULTARSOUND AT BEDSIDE PT'S EDC:12/02/2011    Social History  reports that she has never smoked. She has never used smokeless tobacco. She reports that she does not drink alcohol or use illicit drugs.  Family History family history includes Hyperlipidemia in her mother; Hypertension in her father and mother; Stroke in her mother.   Review of Systems  Constitutional: Negative.   Respiratory: Negative.   Cardiovascular: Negative.   Gastrointestinal: Negative.   Musculoskeletal: Negative.   Neurological: Negative.   Hematological: Negative.   Psychiatric/Behavioral: Negative.   All other systems reviewed and are negative.   BP 158/100 mmHg  Pulse 71  Ht  (1.676 m)  Wt 276 lb 8 oz  (125.42 kg)  BMI 44.65 kg/m2  Physical Exam  Constitutional: She is oriented to person, place, and time. She appears well-developed and well-nourished.  Obese  HENT:  Head: Normocephalic.  Nose: Nose normal.  Mouth/Throat: Oropharynx is clear and moist.  Eyes: Conjunctivae are normal. Pupils are equal, round, and reactive to light.  Neck: Normal range of motion. Neck supple. No JVD present.  Cardiovascular: Normal rate, regular rhythm and intact distal pulses.  Exam reveals no gallop and no friction rub.   Murmur heard.  Systolic murmur is present with a grade of 2/6  Pulmonary/Chest: Effort normal and breath sounds normal. No respiratory distress. She has no wheezes. She has no rales. She exhibits no tenderness.  Abdominal: Soft. Bowel sounds are normal. She exhibits no distension. There is no tenderness.  Musculoskeletal: Normal range of motion. She exhibits no edema or tenderness.  Lymphadenopathy:    She has no cervical adenopathy.  Neurological: She is alert and oriented to person, place, and time. Coordination normal.  Skin: Skin is warm and dry. No rash noted. No erythema.  Psychiatric: She has a normal mood and affect. Her behavior is normal. Judgment and thought content normal.

## 2015-08-30 NOTE — Assessment & Plan Note (Signed)
Shortness of breath likely multifactorial including obesity, deconditioning, chronic diastolic CHF, and poorly controlled hypertension. Recommend weight loss, regular exercise program Blood pressure management as above

## 2015-08-30 NOTE — Assessment & Plan Note (Signed)
Recommended that she start losartan 100 mg daily She could start one half pill for 1 week and up to a full pill We'll repeat BMP today

## 2015-08-30 NOTE — Assessment & Plan Note (Signed)
We will check BMP today If renal function normal, we'll continue Lasix twice a day She does have significant by mouth fluid intake We have called CHF clinic requesting a scale Dietary guide provided, discuss CHF management, monitoring of her weight   Total encounter time more than 25 minutes  Greater than 50% was spent in counseling and coordination of care with the patient

## 2015-08-30 NOTE — Assessment & Plan Note (Signed)
We have encouraged continued exercise, careful diet management in an effort to lose weight. Dietary guide provided 

## 2015-08-30 NOTE — Patient Instructions (Addendum)
You are doing well.  We will check a BMP today  Please start losartan one a day for blood pressure  Try the afternoon lasix after lunch  Please call us if you have new issues that need to be addressed before your next appt.  Your physician wants you to follow-up in: 6 months.  You will receive a reminder letter in the mail two months in advance. If you don't receive a letter, please call our office to schedule the follow-up appointment.    Basic Metabolic Panel WHY AM I HAVING THIS TEST? A basic metabolic panel measures levels of the following substances in your blood:   Glucose. Glucose is a simple sugar that serves as the main source of energy for your body.  Creatinine. Creatinine is a waste product of normal muscle activity. It is excreted from the body by the kidneys.  Blood urea nitrogen (BUN). Urea nitrogen is a waste product of protein breakdown. It is produced when excess protein in your body is broken down and used for energy. It is excreted by the kidneys.  Electrolytes. Electrolytes are negatively or positively charged particles that are dissolved in the water of different body compartments. This includes the serum portion of blood, water inside cells, and water outside cells. Concentrations of electrolytes vary among the different fluid compartments. Electrolytes are tightly regulated to maintain a salt-water and acid-base balance in the body. The electrolytes measured in a basic metabolic panel include:  Potassium.  Sodium.  Chloride.  Calcium.  Bicarbonate. WHAT KIND OF SAMPLE IS TAKEN? A blood sample is required for this test. It is usually collected by inserting a needle into a vein. HOW DO I PREPARE FOR THE TEST? Your health care provider may ask you to avoid eating or drinking anything before your blood sample is taken. Do not eat or drink anything after midnight on the night before the procedure or as directed by your health care provider. WHAT ARE THE  REFERENCE RANGES? Reference ranges are considered healthy ranges established after testing a large group of healthy people. Reference ranges may vary among different people, labs, and hospitals. It is your responsibility to obtain your test results. Ask the lab or department performing the test when and how you will get your results. The following are reference ranges for each part of a basic metabolic panel: Glucose  Cord: 45-96 mg/dL or 0.8-6.5 mmol/L (SI units).  Premature infant: 20-60 mg/dL or 7.8-4.6 mmol/L.  Neonate: 30-60 mg/dL or 9.6-2.9 mmol/L.  Infant: 40-90 mg/dL or 5.2-8 mmol/L.  Child under 29 years old: 60-100 mg/dL or 4.1-3.2 mmol/L.  Adult or child over 43 years old:  Fasting: 70-110 mg/dL or less than 6.1 mmol/L.  Random (nonfasting or casual): less than or equal to 200 mg/dL or less than 44.0 mmol/L.  Elderly: increase in normal range after age 68 years. Creatinine  Child under 56 years old: 0.1-0.4 mg/dL.  Child 34-53 years old: 0.2-0.5 mg/dL.  Child 65-43 years old: 0.3-0.6 mg/dL.  Child or adolescent 35-48 years old: 0.4-1 mg/dL.  Adult 61-21 years old:  Female: 0.5-1 mg/dL.  Female: 0.6-1.2 mg/dL.  Adult 61-105 years old:  Female: 0.5-1.1 mg/dL.  Female: 0.6-1.3 mg/dL.  Adult 64 years old and above:  Female: 0.5-1.2 mg/dL.  Female: 0.7-1.3 mg/dL. BUN  Cord: 21-40 mg/dL.  Newborn: 3-12 mg/dL.  Infant: 5-18 mg/dL.  Child: 5-18 mg/dL.  Adult: 10-20 mg/dL or 1.0-2.7 mmol/L (SI units).  Elderly: may be slightly higher than adult. Potassium  Newborn: 3.9-5.9  mEq/L.  Infant: 4.1-5.3 mEq/L.  Child: 3.4-4.7 mEq/L.  Adult or elderly: 3.5-5 mEq/L or 3.5-5 mmol/L (SI units). Sodium  Newborn: 134-144 mEq/L.  Infant: 134-150 mEq/L.  Child: 136-145 mEq/L.  Adult or elderly: 136-145 mEq/L or 136-145 mmol/L (SI units). Chloride  Premature infant: 95-110 mEq/L.  Newborn: 96-106 mEq/L.  Child: 90-110 mEq/L.  Adult or elderly: 98-106 mEq/L  or 98-106 mmol/L (SI units). Calcium  Total calcium:  Newborn under 70 days old: 7.6-10.4 mg/dL or 7.6-1.9 mmol/L.  Umbilical: 9-11.5 mg/dL or 5.09-3.26 mmol/L.  61 days to 41 years old: 9-10.6 mg/dL or 7.12-4.58 mmol/L.  Child: 8.8-10.8 mg/dL or 0.9-9.8 mmol/L.  Adult: 9-10.5 mg/dL or 3.38-2.50 mmol/L.  Ionized calcium:  Newborn: 4.20-5.58 mg/dL or 5.39-7.67 mmol/L.  2 months to 41 years old: 4.80-5.52 mg/dL or 3.41-9.37 mmol/L.  Adult: 4.5-5.6 mg/dL or 9.02-4.09 mmol/L. Bicarbonate  Newborn: 13-22 mEq/L.  Infant: 20-28 mEq/L.  Child: 20-28 mEq/L.  Adult or elderly: 23-30 mEq/L or 23-30 mmol/L (SI units). WHAT DO THE RESULTS MEAN? Diet and levels of activity can have an effect on your test results. Sometimes they can be the cause of values that are outside of normal limits. However, sometimes values outside normal limits can indicate a medical disorder.  Glucose:  Abnormally high glucose levels (hyperglycemia) are usually associated with prediabetes mellitus and diabetes mellitus. They can also occur with severe stress on the body. This can come from surgery or events such as stroke or trauma. Overactive thyroid gland and pancreatitis or pancreatic cancer can also cause abnormally high glucose levels.  Abnormally low glucose levels (hypoglycemia) can occur with underactive thyroid gland and rare insulin-secreting tumors (insulinoma).  Creatinine:  Abnormally high creatinine levels are most commonly seen in kidney failure. They can also be seen with overactive thyroid (hyperthyroidism), conditions related to overgrowth of the body, abnormal breakdown of muscle tissue, and early muscular dystrophy.  Abnormally low creatinine levels can indicate low muscle mass associated with malnutrition or late-stage muscular dystrophy.  BUN:  Abnormally high BUN levels generally mean that your kidneys are not functioning normally.  Abnormally low BUN levels can be seen with  malnutrition and liver failure.  Potassium:  Abnormally high potassium levels are most often seen with kidney disease, massive destruction of red blood cells, and adrenal gland failure.  Abnormally low potassium levels are seen with excessive levels of the hormone aldosterone.  Sodium:  Abnormally high sodium levels can be seen with dehydration, excessive thirst, and urination due to abnormally low levels of antidiuretic hormone (diabetes insipidus). They can also be seen with excessive levels of aldosterone or cortisol in the body.  Abnormally low levels of sodium can be seen with congestive heart failure, cirrhosis of the liver, kidney failure, and the syndrome of inappropriate antidiuretic hormone (SIADH). Chloride:  Abnormally high levels of chloride can be seen with acute kidney failure, diabetes insipidus, prolonged diarrhea, and poisoning with aspirin or bromide.  Abnormally low levels of chloride can be seen with prolonged vomiting, acute adrenal gland failure, and SIADH.  Calcium:  Abnormally high levels of calcium can occur with excessive activity of the parathyroid glands, certain cancers, and a type of inflammation seen in sarcoidosis and tuberculosis.  Abnormally low levels of calcium can be seen with underactive parathyroid glands, vitamin D deficiency, and acute pancreatitis.  Bicarbonate:  Abnormally high bicarbonate levels are seen after prolonged vomiting and diuretic therapy, which lead to a decrease in the amount of acid in the body (metabolic alkalosis). They can also  be seen in conditions that increase the amount of bicarbonate in the body. These conditions include rare hereditary disorders that interfere with how your kidneys handle electrolytes.  Abnormally low bicarbonate levels are seen with conditions that cause your body to produce too much acid (metabolic acidosis). These conditions include uncontrolled diabetes mellitus and poisoning with aspirin, methanol,  or antifreeze (ethylene glycol). Talk with your health care provider to discuss your results, treatment options, and if necessary, the need for more tests. Talk with your health care provider if you have any questions about your results.   This information is not intended to replace advice given to you by your health care provider. Make sure you discuss any questions you have with your health care provider.   Document Released: 08/09/2004 Document Revised: 08/07/2014 Document Reviewed: 12/15/2013 Elsevier Interactive Patient Education [employer]

## 2015-08-31 LAB — BASIC METABOLIC PANEL
BUN/Creatinine Ratio: 10 (ref 9–23)
BUN: 11 mg/dL (ref 6–24)
CALCIUM: 8.9 mg/dL (ref 8.7–10.2)
CHLORIDE: 103 mmol/L (ref 96–106)
CO2: 23 mmol/L (ref 18–29)
Creatinine, Ser: 1.11 mg/dL — ABNORMAL HIGH (ref 0.57–1.00)
GFR calc Af Amer: 72 mL/min/{1.73_m2} (ref >59)
GFR calc non Af Amer: 62 mL/min/{1.73_m2} (ref >59)
GLUCOSE: 104 mg/dL — AB (ref 65–99)
POTASSIUM: 4.1 mmol/L (ref 3.5–5.2)
Sodium: 144 mmol/L (ref 134–144)

## 2016-05-02 ENCOUNTER — Ambulatory Visit: Payer: Medicaid Other | Admitting: Cardiovascular Disease

## 2016-05-30 ENCOUNTER — Encounter: Payer: Self-pay | Admitting: Cardiovascular Disease

## 2016-05-30 ENCOUNTER — Ambulatory Visit (INDEPENDENT_AMBULATORY_CARE_PROVIDER_SITE_OTHER): Payer: 59 | Admitting: Cardiovascular Disease

## 2016-05-30 VITALS — BP 160/100 | HR 95 | Ht 64.0 in | Wt 279.0 lb

## 2016-05-30 DIAGNOSIS — R0600 Dyspnea, unspecified: Secondary | ICD-10-CM | POA: Diagnosis not present

## 2016-05-30 DIAGNOSIS — I5032 Chronic diastolic (congestive) heart failure: Secondary | ICD-10-CM

## 2016-05-30 DIAGNOSIS — I10 Essential (primary) hypertension: Secondary | ICD-10-CM

## 2016-05-30 DIAGNOSIS — E876 Hypokalemia: Secondary | ICD-10-CM | POA: Diagnosis not present

## 2016-05-30 MED ORDER — ISOSORBIDE MONONITRATE ER 30 MG PO TB24: 30.0000 mg | ORAL_TABLET | Freq: Every day | ORAL | 11 refills | Status: DC

## 2016-05-30 NOTE — Progress Notes (Addendum)
Cardiology Office Note  Date:  05/30/2016   ID:  Heather Norman, DOB Aug 04, 1975, MRN [id number]  PCP:  Luis Abed   Chief Complaint  Patient presents with  . Congestive Heart Failure  . Dyspnea, unspecified type  . Hypertension    HPI:  Heather Norman a pleasant 40 year old woman with morbid obesity, diabetes type 2, hypertension, chronic diastolic CHF, who presents for routine office visit for her diastolic CHF and shortness of breath.  In follow-up today, she reports that she took herself Off iron pill, potassium pill, stomach pill. She was seen by primary care, lab work done, told that she had very low potassium. Told to restart her supplemental potassium. Previously was taking potassium twice a day, now only takes this once a day  Reports feeling sluggish in the AM Thought it was some of her medications Prefers to take potassium in the evening Continues to take Lasix twice a day  Previously had urinary incontinence issues  Denies having Cough which is her usual symptom for diastolic CHF Weight continues to be a problem, she is not been exercising Blood pressure seems to be running high but does not have blood pressure cuff at home   Previous trip to [place], was very short of breath walking around She attributes this to deconditioning  Lab work reviewed showing LDL 116, total cholesterol 186, hemoglobin A1c 6.4  EKG on today's visit shows normal sinus rhythm, T-wave abnormality in V4 through V6, lead II  Other past medical history reviewed  She stopped lisinopril on her own, she was worried about angioedema  Prior echocardiogram 2015 showing normal LV function, mild MR    PMH:   has a past medical history of Arrhythmia; Arthritis; CHF (congestive heart failure) (HCC); Diabetes mellitus without complication (HCC); Headache(784.0); Heart murmur; Hyperlipidemia; Hypertension; Obesity; and Shortness of breath.  PSH:    Past Surgical History:  Procedure  Laterality Date  . CERVICAL CERCLAGE  06/01/2011   Procedure: CERCLAGE CERVICAL;  Surgeon: Philip Aspen, DO;  Location: [facility];  Service: Gynecology;  Laterality: N/A;  REQUESTING PORTABLE ULTARSOUND AT BEDSIDE PT'S EDC:12/02/2011  . CERVICAL CERCLAGE  06/01/2011   Procedure: CERCLAGE CERVICAL;  Surgeon: Philip Aspen, DO;  Location: [facility];  Service: Gynecology;  Laterality: N/A;  REQUESTING PORTABLE ULTARSOUND AT BEDSIDE PT'S EDC:12/02/2011  . endosocpy    . svd     x 2  . tab     x 1    Current Outpatient Prescriptions  Medication Sig Dispense Refill  . amLODipine (NORVASC) 5 MG tablet Take 1 tablet (5 mg total) by mouth daily. 30 tablet 3  . aspirin 81 MG tablet Take 1 tablet (81 mg total) by mouth daily. 30 tablet   . atorvastatin (LIPITOR) 20 MG tablet Take 20 mg by mouth daily.    . carvedilol (COREG) 25 MG tablet Take 25 mg by mouth 2 (two) times daily with a meal.    . cetirizine (ZYRTEC) 10 MG tablet Take 10 mg by mouth daily.    . fluticasone (VERAMYST) 27.5 MCG/SPRAY nasal spray Place 2 sprays into the nose daily.    . furosemide (LASIX) 40 MG tablet Take 1 tablet (40 mg total) by mouth 2 (two) times daily. 60 tablet 6  . losartan (COZAAR) 100 MG tablet Take 1 tablet (100 mg total) by mouth daily. 90 tablet 3  . metFORMIN (GLUCOPHAGE) 500 MG tablet Take 500 mg by mouth 2 (two) times daily with a meal.    .  oxybutynin (DITROPAN-XL) 5 MG 24 hr tablet Take 5 mg by mouth 2 (two) times daily.    . potassium chloride SA (KLOR-CON M20) 20 MEQ tablet Take 1 tablet (20 mEq total) by mouth 2 (two) times daily. 60 tablet 3   No current facility-administered medications for this visit.      Allergies:   Penicillins   Social History:  The patient  reports that she has never smoked. She has never used smokeless tobacco. She reports that she does not drink alcohol or use drugs.   Family History:   family history includes Hyperlipidemia in her mother; Hypertension in her father and  mother; Stroke in her mother.    Review of Systems: Review of Systems  Constitutional: Negative.   Respiratory: Positive for shortness of breath.   Cardiovascular: Negative.   Gastrointestinal: Negative.   Musculoskeletal: Negative.   Neurological: Negative.   Psychiatric/Behavioral: Negative.   All other systems reviewed and are negative.    PHYSICAL EXAM: VS:  BP (!) 160/100   Pulse 95   Ht 5\' 4"  (1.626 m)   Wt 279 lb (126.6 kg)   SpO2 98%   BMI 47.89 kg/m  , BMI Body mass index is 47.89 kg/m. GEN: Well nourished, well developed, in no acute distress , Obese  HEENT: normal  Neck: no JVD, carotid bruits, or masses Cardiac: RRR; no murmurs, rubs, or gallops,no edema  Respiratory:  clear to auscultation bilaterally, normal work of breathing GI: soft, nontender, nondistended, + BS MS: no deformity or atrophy  Skin: warm and dry, no rash Neuro:  Strength and sensation are intact Psych: euthymic mood, full affect    Recent Labs: 08/30/2015: BUN 11; Creatinine, Ser 1.11; Potassium 4.1; Sodium 144    Lipid Panel Lab Results  Component Value Date   CHOL 168 01/14/2015   HDL 42 01/14/2015   LDLCALC 106 (H) 01/14/2015   TRIG 102 01/14/2015      Wt Readings from Last 3 Encounters:  05/30/16 279 lb (126.6 kg)  08/30/15 276 lb 8 oz (125.4 kg)  02/16/15 279 lb (126.6 kg)       ASSESSMENT AND PLAN:  Essential hypertension - Plan: EKG 12-Lead Persistently elevated blood pressure, recommended she start isosorbide 30 mg daily. We did discuss possibly increasing amlodipine but afraid this could contribute to worsening leg swelling. She already has dry mouth, will avoid clonidine  Dyspnea, unspecified type - Plan: EKG 12-Lead Suspect shortness of breath secondary to obesity, deconditioning Recommended regular exercise program Weight loss  Chronic diastolic CHF (congestive heart failure) (HCC) - Plan: EKG 12-Lead Recommended she continue on Lasix 40 mg twice a  day Recent creatinine within normal range through primary care We will repeat potassium. She is only taking one a day. Previously was taking this twice a day  Morbid obesity We have encouraged continued exercise, careful diet management in an effort to lose weight.  Hypokalemia Order placed for BMP   Total encounter time more than 25 minutes  Greater than 50% was spent in counseling and coordination of care with the patient    Disposition:   F/U  6 months  Orders Placed This Encounter  Procedures  . EKG 12-Lead     Signed, Dossie Arbourim Gollan, M.D., Ph.D. 05/30/2016  [facility], [place] [phone]

## 2016-05-30 NOTE — Patient Instructions (Signed)
Medication Instructions:   Please start imdur/isosorbide one a day for blood pressure  Labwork:  Please check BMP at [facility] for low potassium   Testing/Procedures:  No further testing at this time   Follow-Up: It was a pleasure seeing you in the office today. Please call us if you have new issues that need to be addressed before your next appt.  [phone]  Your physician wants you to follow-up in: 6 months.  You will receive a reminder letter in the mail two months in advance. If you don't receive a letter, please call our office to schedule the follow-up appointment.  If you need a refill on your cardiac medications before your next appointment, please call your pharmacy.

## 2016-06-05 ENCOUNTER — Other Ambulatory Visit: Payer: Self-pay | Admitting: Cardiovascular Disease

## 2016-06-05 ENCOUNTER — Encounter: Payer: Self-pay | Admitting: Cardiovascular Disease

## 2016-06-06 LAB — BASIC METABOLIC PANEL
BUN / CREAT RATIO: 13 (ref 9–23)
BUN: 10 mg/dL (ref 6–24)
CALCIUM: 8.8 mg/dL (ref 8.7–10.2)
CHLORIDE: 99 mmol/L (ref 96–106)
CO2: 25 mmol/L (ref 18–29)
CREATININE: 0.8 mg/dL (ref 0.57–1.00)
GFR calc non Af Amer: 92 mL/min/{1.73_m2} (ref >59)
GFR, EST AFRICAN AMERICAN: 106 mL/min/{1.73_m2} (ref >59)
GLUCOSE: 93 mg/dL (ref 65–99)
Potassium: 3.7 mmol/L (ref 3.5–5.2)
Sodium: 141 mmol/L (ref 134–144)

## 2016-06-27 ENCOUNTER — Encounter: Payer: Self-pay | Admitting: Emergency Medicine

## 2016-06-27 ENCOUNTER — Emergency Department
Admission: EM | Admit: 2016-06-27 | Discharge: 2016-06-27 | Disposition: A | Discharge status: Home / Self Care | Payer: 59 | Attending: Emergency Medicine | Admitting: Emergency Medicine

## 2016-06-27 DIAGNOSIS — Y829 Unspecified medical devices associated with adverse incidents: Secondary | ICD-10-CM | POA: Diagnosis not present

## 2016-06-27 DIAGNOSIS — T887XXA Unspecified adverse effect of drug or medicament, initial encounter: Secondary | ICD-10-CM | POA: Diagnosis not present

## 2016-06-27 DIAGNOSIS — M791 Myalgia, unspecified site: Secondary | ICD-10-CM

## 2016-06-27 DIAGNOSIS — I110 Hypertensive heart disease with heart failure: Secondary | ICD-10-CM | POA: Insufficient documentation

## 2016-06-27 DIAGNOSIS — H9201 Otalgia, right ear: Secondary | ICD-10-CM | POA: Diagnosis not present

## 2016-06-27 DIAGNOSIS — I5032 Chronic diastolic (congestive) heart failure: Secondary | ICD-10-CM | POA: Diagnosis not present

## 2016-06-27 DIAGNOSIS — T364X5A Adverse effect of tetracyclines, initial encounter: Secondary | ICD-10-CM | POA: Diagnosis not present

## 2016-06-27 DIAGNOSIS — Z7984 Long term (current) use of oral hypoglycemic drugs: Secondary | ICD-10-CM | POA: Insufficient documentation

## 2016-06-27 DIAGNOSIS — E119 Type 2 diabetes mellitus without complications: Secondary | ICD-10-CM | POA: Insufficient documentation

## 2016-06-27 DIAGNOSIS — Z7982 Long term (current) use of aspirin: Secondary | ICD-10-CM | POA: Diagnosis not present

## 2016-06-27 DIAGNOSIS — R51 Headache: Secondary | ICD-10-CM | POA: Diagnosis present

## 2016-06-27 DIAGNOSIS — Z79899 Other long term (current) drug therapy: Secondary | ICD-10-CM | POA: Insufficient documentation

## 2016-06-27 DIAGNOSIS — T50905A Adverse effect of unspecified drugs, medicaments and biological substances, initial encounter: Secondary | ICD-10-CM

## 2016-06-27 NOTE — Discharge Instructions (Signed)
Your exam is benign. Your symptoms appear to be related to a recently dosed antibiotic and your recently prescribed high blood pressure medicine. Continue to dose your blood pressure medicine as directed. Hydrate and take Tylenol as needed for pain . Follow-up with your provider for continued symptoms. Return to the ED as needed.

## 2016-06-27 NOTE — ED Triage Notes (Signed)
Pt reports a week ago started with an earache to her right ear. Pt reports that a couple of days later she started with body aches to her upper body. Pt reports arms, hands and right ear still hurt. Pt reports can't keep her arms crossed for an extended period of time because they hurt. Pt reports cough as well but denies fevers, congestion or body aches to lower extremeties.

## 2016-06-27 NOTE — ED Provider Notes (Signed)
[facility] Emergency Department Provider Note ____________________________________________  Time seen: 85  I have reviewed the triage vital signs and the nursing notes.  HISTORY  Chief Complaint  Otalgia; Arm Pain; and Hand Pain  HPI Heather Norman is a 40 y.o. female presents to the ED for evaluation of symptoms which include arm muscle pain, tingling in the hands, headache, and resolving ear pain. She completed a course of Doxycycline about 3 days prior, given by a local urgent care provider. She was being treated for right otalgia due to a small cyst/pimple in the ear canal. She also recently started on isosorbide 30 mg by her PCP for HTN. She took her initial dose of that medication on Sunday, two days prior. She noted the onset of headaches following the medication, and subsequently stopped it. She is unclear at this time if the two meds together caused the headache. She has never dosed either Doxycycline or isosorbide in the past. She denies rash, cough, or swelling of mouth/lips, or hands. In addition to HTN, she has hypokalemia, CHF, and DM.   Past Medical History:  Diagnosis Date  . Arrhythmia   . Arthritis    kness/hands - no meds  . CHF (congestive heart failure) (HCC)   . Diabetes mellitus without complication (HCC)   . Headache(784.0)    otc med prn  . Heart murmur   . Hyperlipidemia    no meds since pregnancy  . Hypertension   . Obesity   . Shortness of breath    with exercise    Patient Active Problem List   Diagnosis Date Noted  . Morbid obesity (HCC) 08/30/2015  . Chronic diastolic CHF (congestive heart failure) (HCC) 08/30/2015  . Dyspnea 02/16/2015  . HTN (hypertension) 02/16/2015  . Elderly multigravida with antepartum condition or complication 05/22/2011    Past Surgical History:  Procedure Laterality Date  . CERVICAL CERCLAGE  06/01/2011   Procedure: CERCLAGE CERVICAL;  Surgeon: Philip Aspen, DO;  Location: [facility];   Service: Gynecology;  Laterality: N/A;  REQUESTING PORTABLE ULTARSOUND AT BEDSIDE PT'S EDC:12/02/2011  . CERVICAL CERCLAGE  06/01/2011   Procedure: CERCLAGE CERVICAL;  Surgeon: Philip Aspen, DO;  Location: [facility];  Service: Gynecology;  Laterality: N/A;  REQUESTING PORTABLE ULTARSOUND AT BEDSIDE PT'S EDC:12/02/2011  . endosocpy    . svd     x 2  . tab     x 1    Prior to Admission medications   Medication Sig Start Date End Date Taking? Authorizing Provider  amLODipine (NORVASC) 5 MG tablet Take 1 tablet (5 mg total) by mouth daily. 02/16/15   Vesta Mixer, MD  aspirin 81 MG tablet Take 1 tablet (81 mg total) by mouth daily. 03/25/14   Vesta Mixer, MD  atorvastatin (LIPITOR) 20 MG tablet Take 20 mg by mouth daily.    Historical Provider, MD  carvedilol (COREG) 25 MG tablet Take 25 mg by mouth 2 (two) times daily with a meal.    Historical Provider, MD  cetirizine (ZYRTEC) 10 MG tablet Take 10 mg by mouth daily.    Historical Provider, MD  fluticasone (VERAMYST) 27.5 MCG/SPRAY nasal spray Place 2 sprays into the nose daily.    Historical Provider, MD  furosemide (LASIX) 40 MG tablet Take 1 tablet (40 mg total) by mouth 2 (two) times daily. 05/01/14   Vesta Mixer, MD  isosorbide mononitrate (IMDUR) 30 MG 24 hr tablet Take 1 tablet (30 mg total) by mouth daily. 05/30/16  [name], MD  losartan (COZAAR) 100 MG tablet Take 1 tablet (100 mg total) by mouth daily. 08/30/15   [name], MD  metFORMIN (GLUCOPHAGE) 500 MG tablet Take 500 mg by mouth 2 (two) times daily with a meal.    Historical Provider, MD  oxybutynin (DITROPAN-XL) 5 MG 24 hr tablet Take 5 mg by mouth 2 (two) times daily.    Historical Provider, MD  potassium chloride SA (KLOR-CON M20) 20 MEQ tablet Take 1 tablet (20 mEq total) by mouth 2 (two) times daily. 05/01/14   Vesta Mixer, MD    Allergies Penicillins  Family History  Problem Relation Age of Onset  . Stroke Mother   . Hypertension Mother    . Hyperlipidemia Mother   . Hypertension Father     Social History Social History  Substance Use Topics  . Smoking status: Never Smoker  . Smokeless tobacco: Never Used  . Alcohol use No    Review of Systems  Constitutional: Negative for fever. Eyes: Negative for visual changes. ENT: Negative for sore throat. Cardiovascular: Negative for chest pain. Respiratory: Negative for shortness of breath. Gastrointestinal: Negative for abdominal pain, vomiting and diarrhea. Genitourinary: Negative for dysuria. Musculoskeletal: Negative for back pain. Reports forearm muscle pains.  Skin: Negative for rash. Neurological: Negative for headaches, focal weakness or numbness. Reports hand tingling. ____________________________________________  PHYSICAL EXAM:  VITAL SIGNS: ED Triage Vitals  Enc Vitals Group     BP 06/27/16 0936 (!) 167/95     Pulse Rate 06/27/16 0936 81     Resp 06/27/16 0936 19     Temp 06/27/16 0936 98.6 F (37 C)     Temp Source 06/27/16 0936 Oral     SpO2 06/27/16 0936 98 %     Weight 06/27/16 0937 273 lb (123.8 kg)     Height 06/27/16 0937 5\' 4"  (1.626 m)     Head Circumference --      Peak Flow --      Pain Score 06/27/16 0937 5     Pain Loc --      Pain Edu? --      Excl. in GC? --    Constitutional: Alert and oriented. Well appearing and in no distress. Head: Normocephalic and atraumatic. Eyes: Conjunctivae are normal. PERRL. Normal extraocular movements Ears: Canals clear. TMs intact bilaterally. Right canal reveals a small resolving papule to the anterior portion of the canal. No edema, maceration, or fullness is noted to the canal. Nose: No congestion/rhinorrhea/epistaxis. Mouth/Throat: Mucous membranes are moist. Neck: Supple. No thyromegaly. Hematological/Lymphatic/Immunological: No cervical lymphadenopathy. Cardiovascular: Normal rate, regular rhythm. Normal distal pulses. Respiratory: Normal respiratory effort. No  wheezes/rales/rhonchi. Gastrointestinal: Soft and nontender. No distention. Musculoskeletal: Nontender with normal range of motion in all extremities.  Neurologic: Cranial nerves II through XII grossly intact. Normal UE/LE DTRs bilaterally. Normal gait without ataxia. Normal speech and language. No gross focal neurologic deficits are appreciated. Skin:  Skin is warm, dry and intact. No rash noted. Psychiatric: Mood and affect are normal. Patient exhibits appropriate insight and judgment. ____________________________________________  INITIAL IMPRESSION / ASSESSMENT AND PLAN / ED COURSE  Patient with a normal physical exam without neuromuscular deficit. Review of her medical history and recent medications reveals that her symptoms are likely a medication reaction. She likely developed general myalgias/arthralgias from the doxycycline. She has completed the course at this time. Her hand paresthesias are likely due to her hypokalemia. She describes having labs drawn in the last 2 weeks by  both her PCP and cardiologist. She had recently self-discontinued her K-dur prior to evaluation. She recently started on isosorbide for HTN; flushing and headaches are common side effects. Her exam is otherwise benign. She is not interested in having labs redrawn at this time. She is encouraged to restart her isosorbide and notify her cardiologist of the side effects. She will dose Tylenol for headache pain relief as needed. She is agreeable to the current treatment plan.   Clinical Course    ____________________________________________  FINAL CLINICAL IMPRESSION(S) / ED DIAGNOSES  Final diagnoses:  Myalgia  Drug reaction, initial encounter  Otalgia of right ear     Lissa HoardJenise V Bacon , PA-C 06/27/16 1658    Emily FilbertJonathan E Williams, MD 06/28/16 [phone]

## 2016-12-02 NOTE — Progress Notes (Signed)
Cardiology Office Note  Date:  12/04/2016   ID:  Heather Norman, DOB 1976-06-16, MRN [id number]  PCP:  [facility]   Chief Complaint  Patient presents with  . other    6 month follow up. Patient states SOB has got alot better. Meds reviewed verbally with patient.     HPI:  Heather Norman a pleasant 41 year old woman with morbid obesity,  diabetes type 2,  hypertension,  chronic diastolic CHF,  who presents for routine office visit for her diastolic CHF and shortness of breath.  BP elevated>160 at home No BP cuff  She is working with a nutritionist Still drinking soda every day No recent lab work to evaluate potassium level  She is taking 2 potassium pills per day No regular exercise program Weight continues to be an issue  Previous labs reviewed personally by myself and with the patient on todays visit Lab work reviewed showing LDL 116, total cholesterol 186, hemoglobin A1c 6.4  EKG personally reviewed by myself on todays visit Shows normal sinus rhythm rate 70 bpm T-wave abnormality 1, aVL, V6,   Other past medical history reviewed  Previously had urinary incontinence issues   Previous trip to [place], was very short of breath walking around She attributes this to deconditioning  Prior echocardiogram 2015 showing normal LV function, mild MR    PMH:   has a past medical history of Arrhythmia; Arthritis; CHF (congestive heart failure) (HCC); Diabetes mellitus without complication (HCC); Headache(784.0); Heart murmur; Hyperlipidemia; Hypertension; Obesity; and Shortness of breath.  PSH:    Past Surgical History:  Procedure Laterality Date  . CERVICAL CERCLAGE  06/01/2011   Procedure: CERCLAGE CERVICAL;  Surgeon: Heather Aspen, DO;  Location: [facility];  Service: Gynecology;  Laterality: N/A;  REQUESTING PORTABLE ULTARSOUND AT BEDSIDE PT'S EDC:12/02/2011  . CERVICAL CERCLAGE  06/01/2011   Procedure: CERCLAGE CERVICAL;  Surgeon: Heather Aspen, DO;  Location: [facility];  Service: Gynecology;  Laterality: N/A;  REQUESTING PORTABLE ULTARSOUND AT BEDSIDE PT'S EDC:12/02/2011  . endosocpy    . svd     x 2  . tab     x 1    Current Outpatient Prescriptions  Medication Sig Dispense Refill  . amLODipine (NORVASC) 5 MG tablet Take 1 tablet (5 mg total) by mouth daily. 30 tablet 3  . aspirin 81 MG tablet Take 1 tablet (81 mg total) by mouth daily. 30 tablet   . atorvastatin (LIPITOR) 20 MG tablet Take 20 mg by mouth daily.    . carvedilol (COREG) 25 MG tablet Take 25 mg by mouth 2 (two) times daily with a meal.    . cetirizine (ZYRTEC) 10 MG tablet Take 10 mg by mouth daily.    . fluticasone (VERAMYST) 27.5 MCG/SPRAY nasal spray Place 2 sprays into the nose daily.    . furosemide (LASIX) 40 MG tablet Take 1 tablet (40 mg total) by mouth 2 (two) times daily. 60 tablet 6  . losartan (COZAAR) 100 MG tablet Take 1 tablet (100 mg total) by mouth daily. 90 tablet 3  . metFORMIN (GLUCOPHAGE) 500 MG tablet Take 500 mg by mouth 2 (two) times daily with a meal.    . oxybutynin (DITROPAN-XL) 5 MG 24 hr tablet Take 5 mg by mouth 2 (two) times daily.    . potassium chloride SA (KLOR-CON M20) 20 MEQ tablet Take 1 tablet (20 mEq total) by mouth 2 (two) times daily. 60 tablet 3   No current facility-administered medications for this  visit.      Allergies:   Penicillins   Social History:  The patient  reports that she has never smoked. She has never used smokeless tobacco. She reports that she does not drink alcohol or use drugs.   Family History:   family history includes Hyperlipidemia in her mother; Hypertension in her father and mother; Stroke in her mother.    Review of Systems: Review of Systems  Constitutional: Negative.   Respiratory: Negative.   Cardiovascular: Negative.   Gastrointestinal: Negative.   Musculoskeletal: Negative.   Neurological: Negative.   Psychiatric/Behavioral: Negative.   All other systems reviewed and  are negative.    PHYSICAL EXAM: VS:  BP (!) 160/110 (BP Location: Left Arm, Patient Position: Sitting, Cuff Size: Normal)   Pulse 70   Ht 5\' 4"  (1.626 m)   Wt 277 lb 8 oz (125.9 kg)   BMI 47.63 kg/m  , BMI Body mass index is 47.63 kg/m. GEN: Well nourished, well developed, in no acute distress , Obese  HEENT: normal  Neck: no JVD, carotid bruits, or masses Cardiac: RRR; no murmurs, rubs, or gallops,no edema  Respiratory:  clear to auscultation bilaterally, normal work of breathing GI: soft, nontender, nondistended, + BS MS: no deformity or atrophy  Skin: warm and dry, no rash Neuro:  Strength and sensation are intact Psych: euthymic mood, full affect    Recent Labs: 06/05/2016: BUN 10; Creatinine, Ser 0.80; Potassium 3.7; Sodium 141    Lipid Panel Lab Results  Component Value Date   CHOL 168 01/14/2015   HDL 42 01/14/2015   LDLCALC 106 (H) 01/14/2015   TRIG 102 01/14/2015      Wt Readings from Last 3 Encounters:  12/04/16 277 lb 8 oz (125.9 kg)  06/27/16 273 lb (123.8 kg)  05/30/16 279 lb (126.6 kg)       ASSESSMENT AND PLAN:   Essential hypertension - Plan: EKG 12-Lead Unable to tolerate isosorbide Recommended we add clonidine 0.1 mg twice a day We will increase amlodipine up to 10 mg daily  Dyspnea, unspecified type - Plan: EKG 12-Lead shortness of breath secondary to obesity, deconditioning Recommended regular exercise program Weight loss She is working with a nutritionist  Chronic diastolic CHF (congestive heart failure) (HCC) - Plan: EKG 12-Lead She is taking 2 Lasix and potassium per day BMP ordered for today  Morbid obesity We have encouraged continued exercise, careful diet management in an effort to lose weight.  Hypokalemia Order placed for BMP   Total encounter time more than 25 minutes  Greater than 50% was spent in counseling and coordination of care with the patient  Disposition:   F/U  6 months   Orders Placed This Encounter   Procedures  . EKG 12-Lead     Signed, Dossie Arbour, M.D., Ph.D. 12/04/2016  [facility] [place], [place] [phone]

## 2016-12-04 ENCOUNTER — Ambulatory Visit (INDEPENDENT_AMBULATORY_CARE_PROVIDER_SITE_OTHER): Payer: 59 | Admitting: Cardiovascular Disease

## 2016-12-04 ENCOUNTER — Encounter: Payer: Self-pay | Admitting: Cardiovascular Disease

## 2016-12-04 VITALS — BP 160/110 | HR 70 | Ht 64.0 in | Wt 277.5 lb

## 2016-12-04 DIAGNOSIS — R0600 Dyspnea, unspecified: Secondary | ICD-10-CM | POA: Diagnosis not present

## 2016-12-04 DIAGNOSIS — I10 Essential (primary) hypertension: Secondary | ICD-10-CM | POA: Diagnosis not present

## 2016-12-04 DIAGNOSIS — I5032 Chronic diastolic (congestive) heart failure: Secondary | ICD-10-CM | POA: Diagnosis not present

## 2016-12-04 MED ORDER — AMLODIPINE BESYLATE 10 MG PO TABS: 10.0000 mg | ORAL_TABLET | Freq: Every day | ORAL | 6 refills | Status: DC

## 2016-12-04 MED ORDER — CLONIDINE HCL 0.1 MG PO TABS: 0.1000 mg | ORAL_TABLET | Freq: Two times a day (BID) | ORAL | 6 refills | Status: DC

## 2016-12-04 NOTE — Patient Instructions (Addendum)
Medication Instructions:   Please increase the amlodipine up to 10 mg daily  Try clonidine 0.1 mg one pill twice a day  Labwork:  BMP today  Testing/Procedures:  No further testing at this time   I recommend watching educational videos on topics of interest to you at:       [email or web address].goemmi.com  Enter code: [email or web address]    Follow-Up: It was a pleasure seeing you in the office today. Please call us if you have new issues that need to be addressed before your next appt.  [phone]  Your physician wants you to follow-up in: 6 months.  You will receive a reminder letter in the mail two months in advance. If you don't receive a letter, please call our office to schedule the follow-up appointment.  If you need a refill on your cardiac medications before your next appointment, please call your pharmacy.

## 2016-12-05 ENCOUNTER — Telehealth: Payer: Self-pay | Admitting: Cardiovascular Disease

## 2016-12-05 LAB — BASIC METABOLIC PANEL
BUN/Creatinine Ratio: 12 (ref 9–23)
BUN: 11 mg/dL (ref 6–24)
CO2: 24 mmol/L (ref 18–29)
Calcium: 8.7 mg/dL (ref 8.7–10.2)
Chloride: 100 mmol/L (ref 96–106)
Creatinine, Ser: 0.89 mg/dL (ref 0.57–1.00)
GFR calc Af Amer: 92 mL/min/{1.73_m2} (ref >59)
GFR, EST NON AFRICAN AMERICAN: 80 mL/min/{1.73_m2} (ref >59)
GLUCOSE: 91 mg/dL (ref 65–99)
Potassium: 3.6 mmol/L (ref 3.5–5.2)
SODIUM: 142 mmol/L (ref 134–144)

## 2016-12-05 NOTE — Telephone Encounter (Signed)
Pharmacist calling regarding a drug interaction between Clonidine and Carvedilol. Please call and advise if pt should be taking Carvedilol

## 2016-12-05 NOTE — Telephone Encounter (Addendum)
Heather Norman, pharmacist, called back to let us know that pt has not been taking carvedilol - she has not filled rx in over 6 months. Please advise if you like for her to resume this w/ clonidine & amlodipine. A new rx will need to be sent in if so.

## 2016-12-05 NOTE — Telephone Encounter (Signed)
Spoke w/ Karie Kirks, float pharmacist, and advised her of Dr. Windell Hummingbird recommendation.  She will make a note in pt's chart and fill her meds.

## 2016-12-05 NOTE — Telephone Encounter (Signed)
Would override Okay to take both Patient can monitor heart rate

## 2016-12-05 NOTE — Telephone Encounter (Signed)
Pharmacist needs your permission to override interaction or hold carvedilol.

## 2016-12-07 NOTE — Telephone Encounter (Signed)
Spoke with patient and confirmed that she does take carvedilol 25 mg twice a day. Let her know that I would call pharmacy and make them aware as well. She stated that she doesn't always take her medications and has not been taking care of herself. Instructed her the importance of taking medications on a regular basis for blood pressure management. She verbalized understanding and states that she is trying to get better with this. Let her know that I would call the pharmacy to confirm that she is currently taking these per her reports. She was appreciative for the call and states that she does have one prescription she will pick up later. She verbalized understanding of our conversation, agreement with plan, and had no further questions at this time.

## 2016-12-07 NOTE — Telephone Encounter (Signed)
We called to confirm she has not been taking Coreg If she has not been taking this, Would restart coreg 12.5 mg twice a day Amlodipine up to 10 once a day Hold the clonidine for now Monitor blood pressure and call us with numbers

## 2016-12-07 NOTE — Telephone Encounter (Signed)
Left voicemail message at pharmacy to call back regarding medication refills

## 2016-12-08 NOTE — Telephone Encounter (Signed)
Left detailed message that patient is in fact taking amlodipine, carvedilol, and clonidine. Also to call back if they need further clarification.

## 2017-04-25 ENCOUNTER — Telehealth: Payer: Self-pay | Admitting: Cardiovascular Disease

## 2017-04-25 DIAGNOSIS — R0602 Shortness of breath: Secondary | ICD-10-CM | POA: Insufficient documentation

## 2017-04-25 NOTE — Telephone Encounter (Signed)
Spoke with patient and made her aware that we have her on a wait list if we should get a cancellation. She reports that she felt it was asthma/allergies that was causing most of her problems. The swelling is only to her knees and not ankles or feet. Advised her to monitor her weights, try compression hose or ace wraps, and if we had appointment open up sooner then we would give her a call. She was appreciative for the call back and had no further questions at this time.

## 2017-04-25 NOTE — Telephone Encounter (Signed)
Pt calling stating she's now leaving her pcp  She was advised to call us States she's been having more SOB over the last couple weeks Along with swelling in her knees  Pt is scheduled right now 05/23/17 with Alycia Rossetti  She is on wait list and is aware we will call if we have anything sooner   She would like some advise on this if she needs anything sooner  Please advise

## 2017-04-30 ENCOUNTER — Other Ambulatory Visit: Payer: Self-pay | Admitting: *Deleted

## 2017-04-30 MED ORDER — AMLODIPINE BESYLATE 10 MG PO TABS: 10.0000 mg | ORAL_TABLET | Freq: Every day | ORAL | 2 refills | Status: DC

## 2017-04-30 MED ORDER — CLONIDINE HCL 0.1 MG PO TABS: 0.1000 mg | ORAL_TABLET | Freq: Two times a day (BID) | ORAL | 2 refills | Status: DC

## 2017-05-06 ENCOUNTER — Emergency Department: Payer: 59

## 2017-05-06 ENCOUNTER — Emergency Department
Admission: EM | Admit: 2017-05-06 | Discharge: 2017-05-06 | Disposition: A | Discharge status: Home / Self Care | Payer: 59 | Attending: Emergency Medicine | Admitting: Emergency Medicine

## 2017-05-06 DIAGNOSIS — R2242 Localized swelling, mass and lump, left lower limb: Secondary | ICD-10-CM | POA: Diagnosis present

## 2017-05-06 DIAGNOSIS — E119 Type 2 diabetes mellitus without complications: Secondary | ICD-10-CM | POA: Diagnosis not present

## 2017-05-06 DIAGNOSIS — I110 Hypertensive heart disease with heart failure: Secondary | ICD-10-CM | POA: Diagnosis not present

## 2017-05-06 DIAGNOSIS — M25562 Pain in left knee: Secondary | ICD-10-CM | POA: Insufficient documentation

## 2017-05-06 DIAGNOSIS — I5032 Chronic diastolic (congestive) heart failure: Secondary | ICD-10-CM | POA: Diagnosis not present

## 2017-05-06 LAB — CBC WITH DIFFERENTIAL/PLATELET
BASOS ABS: 0.1 10*3/uL (ref 0–0.1)
BASOS PCT: 1 %
EOS PCT: 1 %
Eosinophils Absolute: 0.1 10*3/uL (ref 0–0.7)
HCT: 35.8 % (ref 35.0–47.0)
Hemoglobin: 12.3 g/dL (ref 12.0–16.0)
Lymphocytes Relative: 25 %
Lymphs Abs: 2.4 10*3/uL (ref 1.0–3.6)
MCH: 28 pg (ref 26.0–34.0)
MCHC: 34.3 g/dL (ref 32.0–36.0)
MCV: 81.5 fL (ref 80.0–100.0)
Monocytes Absolute: 0.6 10*3/uL (ref 0.2–0.9)
Monocytes Relative: 6 %
NEUTROS ABS: 6.5 10*3/uL (ref 1.4–6.5)
Neutrophils Relative %: 67 %
PLATELETS: 328 10*3/uL (ref 150–440)
RBC: 4.4 MIL/uL (ref 3.80–5.20)
RDW: 16.2 % — ABNORMAL HIGH (ref 11.5–14.5)
WBC: 9.7 10*3/uL (ref 3.6–11.0)

## 2017-05-06 LAB — BASIC METABOLIC PANEL
ANION GAP: 10 (ref 5–15)
BUN: 15 mg/dL (ref 6–20)
CALCIUM: 8.2 mg/dL — AB (ref 8.9–10.3)
CO2: 29 mmol/L (ref 22–32)
Chloride: 102 mmol/L (ref 101–111)
Creatinine, Ser: 1.03 mg/dL — ABNORMAL HIGH (ref 0.44–1.00)
GLUCOSE: 137 mg/dL — AB (ref 65–99)
POTASSIUM: 3 mmol/L — AB (ref 3.5–5.1)
Sodium: 141 mmol/L (ref 135–145)

## 2017-05-06 LAB — TROPONIN I: Troponin I: <0.03 ng/mL (ref <0.03)

## 2017-05-06 LAB — BRAIN NATRIURETIC PEPTIDE: B NATRIURETIC PEPTIDE 5: 53 pg/mL (ref 0.0–100.0)

## 2017-05-06 MED ORDER — TRAMADOL HCL 50 MG PO TABS: 50.0000 mg | ORAL_TABLET | Freq: Four times a day (QID) | ORAL | 0 refills | Status: DC | PRN

## 2017-05-06 MED ORDER — TRAMADOL HCL 50 MG PO TABS
50.0000 mg | ORAL_TABLET | Freq: Once | ORAL | Status: AC
  Administered 2017-05-06: 50 mg via ORAL
  Filled 2017-05-06: qty 1

## 2017-05-06 NOTE — ED Notes (Signed)
Xray in room with pt

## 2017-05-06 NOTE — ED Triage Notes (Addendum)
Patient reports left knee swelling "for awhile" reports now hands with swelling and tingling.  Also reports feeling short of breath "for awhile".  Patient speaking in complete sentences without difficulty or distress noted.

## 2017-05-06 NOTE — ED Notes (Signed)
Pt states right and left hand tingling and swelling "for a couple of days". Pt states right hand tingling is worse than left. Pt also complains of left knee swelling and pain with ambulation. Pt denies orthopnea, no pedal edema noted. Pt with slight swelling noted to bilateral hands. Pt states "i do have shortness of breath sometimes, i've had congestive heart failure before". Pt states she is beginning to develop a headache, states "it's almost time for my blood pressure medicine". resps unlabored. Pt with approx 3+ non pitting edema noted to medial left knee. Left knee is larger than right. No redness noted on exam.

## 2017-05-06 NOTE — ED Notes (Signed)
Report to angela, rn. 

## 2017-05-06 NOTE — ED Provider Notes (Addendum)
[facility] Emergency Department Provider Note       Time seen: ----------------------------------------- 7:39 AM on 05/06/2017 -----------------------------------------     I have reviewed the triage vital signs and the nursing notes.   HISTORY   Chief Complaint Joint Swelling    HPI Heather Norman is a 41 y.o. female who presents to the ED for left knee swelling on all for a while. Patient also reports she is now has hand swelling and tingling bilaterally. She reports feeling short of breath for a while and does report a history of CHF. According to her history she has history of diastolic heart failure and does take Lasix 40 mg twice a day. She also takes potassium, she denies recent illness or other complaints. She does do a lot of walking for her job she states.  Past Medical History:  Diagnosis Date  . Arrhythmia   . Arthritis    kness/hands - no meds  . CHF (congestive heart failure) (HCC)   . Diabetes mellitus without complication (HCC)   . Headache(784.0)    otc med prn  . Heart murmur   . Hyperlipidemia    no meds since pregnancy  . Hypertension   . Obesity   . Shortness of breath    with exercise    Patient Active Problem List   Diagnosis Date Noted  . Morbid obesity (HCC) 08/30/2015  . Chronic diastolic CHF (congestive heart failure) (HCC) 08/30/2015  . Dyspnea 02/16/2015  . HTN (hypertension) 02/16/2015  . Elderly multigravida with antepartum condition or complication 05/22/2011    Past Surgical History:  Procedure Laterality Date  . CERVICAL CERCLAGE  06/01/2011   Procedure: CERCLAGE CERVICAL;  Surgeon: Philip Aspen, DO;  Location: [facility];  Service: Gynecology;  Laterality: N/A;  REQUESTING PORTABLE ULTARSOUND AT BEDSIDE PT'S EDC:12/02/2011  . CERVICAL CERCLAGE  06/01/2011   Procedure: CERCLAGE CERVICAL;  Surgeon: Philip Aspen, DO;  Location: [facility];  Service: Gynecology;  Laterality: N/A;  REQUESTING PORTABLE  ULTARSOUND AT BEDSIDE PT'S EDC:12/02/2011  . endosocpy    . svd     x 2  . tab     x 1    Allergies Penicillins  Social History Social History  Substance Use Topics  . Smoking status: Never Smoker  . Smokeless tobacco: Never Used  . Alcohol use No    Review of Systems Constitutional: Negative for fever. Cardiovascular: Negative for chest pain. Respiratory: positive for shortness of breath Gastrointestinal: Negative for abdominal pain, vomiting and diarrhea. Genitourinary: Negative for dysuria. Musculoskeletal: positive for knee pain, edema Skin: Negative for rash. Neurological: Negative for headaches, focal weakness or numbness.positive for paresthesias  All systems negative/normal/unremarkable except as stated in the HPI  ____________________________________________   PHYSICAL EXAM:  VITAL SIGNS: ED Triage Vitals  Enc Vitals Group     BP 05/06/17 0356 (!) 181/106     Pulse Rate 05/06/17 0356 96     Resp 05/06/17 0356 12     Temp 05/06/17 0356 98.4 F (36.9 C)     Temp Source 05/06/17 0356 Oral     SpO2 05/06/17 0356 95 %     Weight 05/06/17 0357 287 lb (130.2 kg)     Height 05/06/17 0357 5\' 5"  (1.651 m)     Head Circumference --      Peak Flow --      Pain Score --      Pain Loc --      Pain Edu? --  Excl. in GC? --    Constitutional: Alert and oriented. Well appearing and in no distress. Eyes: Conjunctivae are normal. Normal extraocular movements. ENT   Head: Normocephalic and atraumatic.   Nose: No congestion/rhinnorhea.   Mouth/Throat: Mucous membranes are moist.   Neck: No stridor. Cardiovascular: Normal rate, regular rhythm. No murmurs, rubs, or gallops. Respiratory: Normal respiratory effort without tachypnea nor retractions. Breath sounds are clear and equal bilaterally. No wheezes/rales/rhonchi. Gastrointestinal: Soft and nontender. Normal bowel sounds Musculoskeletal: mild tenderness in the left knee, mild edema is  noted Neurologic:  Normal speech and language. No gross focal neurologic deficits are appreciated.  Skin:  Skin is warm, dry and intact. No rash noted. Psychiatric: Mood and affect are normal. Speech and behavior are normal.  ____________________________________________  EKG: Interpreted by me.sinus rhythm rate of 92 bpm, LVH with repolarization abnormality, normal QRS, normal QT  ____________________________________________  ED COURSE:  Pertinent labs & imaging results that were available during my care of the patient were reviewed by me and considered in my medical decision making (see chart for details). Patient presents for dyspnea, swelling and left knee pain, we will assess with labs and imaging as indicated.   Procedures ____________________________________________   LABS (pertinent positives/negatives)  Labs Reviewed  CBC WITH DIFFERENTIAL/PLATELET - Abnormal; Notable for the following:       Result Value   RDW 16.2 (*)    All other components within normal limits  BASIC METABOLIC PANEL - Abnormal; Notable for the following:    Potassium 3.0 (*)    Glucose, Bld 137 (*)    Creatinine, Ser 1.03 (*)    Calcium 8.2 (*)    All other components within normal limits  TROPONIN I  BRAIN NATRIURETIC PEPTIDE    RADIOLOGY chest x-ray reveals mild cardiac enlargement but no other acute process   left knee x-ray is unremarkable ____________________________________________  DIFFERENTIAL DIAGNOSIS   CHF, electrolyte Abnormality, volume overload, knee sprain  FINAL ASSESSMENT AND PLAN  diastolic heart failure, knee pain   Plan: Patient's labs and imaging were dictated above. Patient had presented for left knee pain which is likely from overuse in her job. She does report a 2 pound weight gain which is likely cause some of her tingling and apparent swelling. I will advise extra Lasix dose over the next 3 days. She will also need extra dose of potassium during this time.  Overall I would refer her for outpatient follow-up with a heart failure clinic.   Emily Filbert, MD   Note: This note was generated in part or whole with voice recognition software. Voice recognition is usually quite accurate but there are transcription errors that can and very often do occur. I apologize for any typographical errors that were not detected and corrected.     Emily Filbert, MD 05/06/17 0745    Emily Filbert, MD 05/06/17 [phone]

## 2017-05-10 ENCOUNTER — Encounter: Payer: Self-pay | Admitting: Family

## 2017-05-10 ENCOUNTER — Ambulatory Visit: Payer: 59 | Attending: Family | Admitting: Family

## 2017-05-10 VITALS — BP 127/78 | HR 73 | Resp 20 | Ht 65.0 in | Wt 284.0 lb

## 2017-05-10 DIAGNOSIS — Z6841 Body Mass Index (BMI) 40.0 and over, adult: Secondary | ICD-10-CM | POA: Diagnosis not present

## 2017-05-10 DIAGNOSIS — I110 Hypertensive heart disease with heart failure: Secondary | ICD-10-CM | POA: Diagnosis present

## 2017-05-10 DIAGNOSIS — E785 Hyperlipidemia, unspecified: Secondary | ICD-10-CM | POA: Insufficient documentation

## 2017-05-10 DIAGNOSIS — E119 Type 2 diabetes mellitus without complications: Secondary | ICD-10-CM | POA: Diagnosis not present

## 2017-05-10 DIAGNOSIS — R0683 Snoring: Secondary | ICD-10-CM | POA: Diagnosis not present

## 2017-05-10 DIAGNOSIS — Z79899 Other long term (current) drug therapy: Secondary | ICD-10-CM | POA: Insufficient documentation

## 2017-05-10 DIAGNOSIS — M1990 Unspecified osteoarthritis, unspecified site: Secondary | ICD-10-CM | POA: Diagnosis not present

## 2017-05-10 DIAGNOSIS — Z7982 Long term (current) use of aspirin: Secondary | ICD-10-CM | POA: Insufficient documentation

## 2017-05-10 DIAGNOSIS — I10 Essential (primary) hypertension: Secondary | ICD-10-CM

## 2017-05-10 DIAGNOSIS — I5032 Chronic diastolic (congestive) heart failure: Secondary | ICD-10-CM | POA: Insufficient documentation

## 2017-05-10 DIAGNOSIS — Z7984 Long term (current) use of oral hypoglycemic drugs: Secondary | ICD-10-CM | POA: Diagnosis not present

## 2017-05-10 DIAGNOSIS — Z880 Allergy status to penicillin: Secondary | ICD-10-CM | POA: Diagnosis not present

## 2017-05-10 LAB — BASIC METABOLIC PANEL
ANION GAP: 7 (ref 5–15)
BUN: 14 mg/dL (ref 6–20)
CO2: 29 mmol/L (ref 22–32)
CREATININE: 1.01 mg/dL — AB (ref 0.44–1.00)
Calcium: 9 mg/dL (ref 8.9–10.3)
Chloride: 104 mmol/L (ref 101–111)
GFR calc non Af Amer: >60 mL/min (ref >60)
GLUCOSE: 106 mg/dL — AB (ref 65–99)
Potassium: 3.5 mmol/L (ref 3.5–5.1)
Sodium: 140 mmol/L (ref 135–145)

## 2017-05-10 NOTE — Progress Notes (Signed)
Patient ID: Heather Norman, female    DOB: [birth year], 41 y.o.   MRN: [id number]  HPI  Heather Norman is a 41 y/o female with a history of arthritis, DM, hyperlipidemia, HTN, morbid obesity and chronic heart failure.  Reviewed echo report from 05/15/14 which showed an EF of 60-65% along with mild MR and normal PA pressure.  Was in the ED 05/06/17 due to left knee swelling and shortness of breath. She was treated and released.   She presents today for her initial visit with a chief complaint of fatigue with minimal exertion. She describes this as chronic in nature having been present for several months. Doesn't feel like it's any worse/better. Says that she feels tired "all the time". She has associated cough, shortness of breath, knee swelling, dizziness and difficulty sleeping. Denies any chest pain, palpitations, abdominal distention or weight gain. Feels like when she takes the clonidine that she gets extremely tired. Does endorse snoring for "years"  Past Medical History:  Diagnosis Date  . Arrhythmia   . Arthritis    kness/hands - no meds  . CHF (congestive heart failure) (HCC)   . Diabetes mellitus without complication (HCC)   . Headache(784.0)    otc med prn  . Heart murmur   . Hyperlipidemia    no meds since pregnancy  . Hypertension   . Obesity   . Shortness of breath    with exercise   Past Surgical History:  Procedure Laterality Date  . CERVICAL CERCLAGE  06/01/2011   Procedure: CERCLAGE CERVICAL;  Surgeon: Philip Aspen, DO;  Location: [facility];  Service: Gynecology;  Laterality: N/A;  REQUESTING PORTABLE ULTARSOUND AT BEDSIDE PT'S EDC:12/02/2011  . CERVICAL CERCLAGE  06/01/2011   Procedure: CERCLAGE CERVICAL;  Surgeon: Philip Aspen, DO;  Location: [facility];  Service: Gynecology;  Laterality: N/A;  REQUESTING PORTABLE ULTARSOUND AT BEDSIDE PT'S EDC:12/02/2011  . endosocpy    . svd     x 2  . tab     x 1   Family History  Problem Relation Age of Onset  . Stroke  Mother   . Hypertension Mother   . Hyperlipidemia Mother   . Hypertension Father    Social History  Substance Use Topics  . Smoking status: Never Smoker  . Smokeless tobacco: Never Used  . Alcohol use No   Allergies  Allergen Reactions  . Penicillins Anaphylaxis   Prior to Admission medications   Medication Sig Start Date End Date Taking? Authorizing Provider  amLODipine (NORVASC) 10 MG tablet Take 1 tablet (10 mg total) by mouth daily. 04/30/17  Yes [name], MD  aspirin 81 MG tablet Take 1 tablet (81 mg total) by mouth daily. 03/25/14  Yes Nahser, Deloris Ping, MD  atorvastatin (LIPITOR) 20 MG tablet Take 20 mg by mouth daily.   Yes [provider]  carvedilol (COREG) 25 MG tablet Take 25 mg by mouth 2 (two) times daily with a meal.   Yes [provider]  cloNIDine (CATAPRES) 0.1 MG tablet Take 1 tablet (0.1 mg total) by mouth 2 (two) times daily. 04/30/17  Yes Gollan, Tollie Pizza, MD  fluticasone (VERAMYST) 27.5 MCG/SPRAY nasal spray Place 2 sprays into the nose daily.   Yes [provider]  furosemide (LASIX) 40 MG tablet Take 1 tablet (40 mg total) by mouth 2 (two) times daily. 05/01/14  Yes Nahser, Deloris Ping, MD  loratadine (CLARITIN) 10 MG tablet Take 10 mg by mouth daily as needed for allergies.  Yes [provider]  losartan (COZAAR) 100 MG tablet Take 1 tablet (100 mg total) by mouth daily. 08/30/15  Yes Gollan, Tollie Pizza, MD  metFORMIN (GLUCOPHAGE) 500 MG tablet Take 500 mg by mouth 2 (two) times daily with a meal.   Yes [provider]  oxybutynin (DITROPAN-XL) 5 MG 24 hr tablet Take 5 mg by mouth 2 (two) times daily.   Yes [provider]  potassium chloride SA (KLOR-CON M20) 20 MEQ tablet Take 1 tablet (20 mEq total) by mouth 2 (two) times daily. 05/01/14  Yes Nahser, Deloris Ping, MD  traMADol (ULTRAM) 50 MG tablet Take 1 tablet (50 mg total) by mouth every 6 (six) hours as needed. 05/06/17 05/06/18 Yes Emily Filbert, MD     Review of Systems  Constitutional: Positive for fatigue. Negative for appetite change.  HENT: Negative for congestion, postnasal drip and sore throat.   Eyes: Negative.   Respiratory: Positive for cough and shortness of breath.   Cardiovascular: Positive for leg swelling (around the left knee). Negative for chest pain and palpitations.  Gastrointestinal: Negative for abdominal distention and abdominal pain.  Endocrine: Negative.   Genitourinary: Negative.   Musculoskeletal: Positive for arthralgias (left knee pain). Negative for back pain.  Skin: Negative.   Allergic/Immunologic: Negative.   Neurological: Positive for dizziness. Negative for light-headedness.  Hematological: Negative for adenopathy. Does not bruise/bleed easily.  Psychiatric/Behavioral: Positive for sleep disturbance (sleeping on 5 pillows). Negative for dysphoric mood. The patient is not nervous/anxious.    Vitals:   05/10/17 1149  BP: 127/78  Pulse: 73  Resp: 20  SpO2: 96%  Weight: 284 lb (128.8 kg)  Height: 5\' 5"  (1.651 m)   Wt Readings from Last 3 Encounters:  05/10/17 284 lb (128.8 kg)  05/06/17 287 lb (130.2 kg)  12/04/16 277 lb 8 oz (125.9 kg)   Lab Results  Component Value Date   CREATININE 1.01 (H) 05/10/2017   CREATININE 1.03 (H) 05/06/2017   CREATININE 0.89 12/04/2016   Physical Exam  Constitutional: She is oriented to person, place, and time. She appears well-developed and well-nourished.  HENT:  Head: Normocephalic and atraumatic.  Neck: Normal range of motion. Neck supple. No JVD present.  Cardiovascular: Normal rate and regular rhythm.   Pulmonary/Chest: Effort normal. She has no wheezes. She has no rales.  Abdominal: Soft. She exhibits no distension. There is no tenderness.  Musculoskeletal: She exhibits edema (around left knee only) and tenderness (lateral side of left knee).  Neurological: She is alert and oriented to person, place, and time.  Skin: Skin is warm and dry.   Psychiatric: She has a normal mood and affect. Her behavior is normal. Thought content normal.  Nursing note and vitals reviewed.   Assessment & Plan:  1: Chronic heart failure with preserved ejection fraction- - NYHA class III - euvolemic today - already weighing daily and she says that her weight has been stable. Instructed to call for an overnight weight gain of >2 pounds or a weekly weight gain of >5 pounds - not adding salt to her food and she does rinse canned vegetables prior to using. Using Mrs. Dash for seasoning and has been reading food labels for sodium content. Reviewed the importance of closely following a 2000mg  sodium diet and written dietary information was given to her about this - will get a BMP today as her most recent potassium level was low - BMP from 05/06/17 reviewed and shows sodium 141, potassium 3.0 and GFR >60 -  sees cardiology ([facility]) 05/23/17 - echo ordered as it's been 3 years since last one done. Scheduled for 05/16/17  2: HTN- - BP looks great today - she says that she gets quite tired when taking the clonidine and was wondering if it could be stopped. She sometimes misses a dose anyways - will stop the clonidine today and she is to monitor her blood pressure. If it starts to rise, she is to call back for medication adjustments  3: Diabetes- - taking metformin twice daily - last A1c was 6.6% in 2014 - encouraged her to get updated A1c checked at her PCP's office  4: Snoring- - says that she's snored for years - wakes up tired and stays tired during the day - she says that she's been told that she stops breathing while she sleeps - will get a sleep study ordered to rule out sleep apnea  Medication bottles were reviewed.  Return next week for a recheck of her blood pressure.

## 2017-05-10 NOTE — Patient Instructions (Addendum)
Continue weighing daily and call for an overnight weight gain of > 2 pounds or a weekly weight gain of >5 pounds.  Stop the clonidine.

## 2017-05-11 ENCOUNTER — Telehealth: Payer: Self-pay | Admitting: Family

## 2017-05-11 DIAGNOSIS — E119 Type 2 diabetes mellitus without complications: Secondary | ICD-10-CM

## 2017-05-11 DIAGNOSIS — R0683 Snoring: Secondary | ICD-10-CM | POA: Insufficient documentation

## 2017-05-11 HISTORY — DX: Type 2 diabetes mellitus without complications: E11.9

## 2017-05-11 NOTE — Telephone Encounter (Signed)
Spoke with patient and advised her that her potassium level is now normal and renal function looks good.

## 2017-05-11 NOTE — Addendum Note (Signed)
Addended by: [name] on: 05/11/2017 12:29 PM   Modules accepted: Orders

## 2017-05-14 ENCOUNTER — Ambulatory Visit: Payer: 59 | Attending: Family | Admitting: Family

## 2017-05-14 ENCOUNTER — Encounter: Payer: Self-pay | Admitting: Family

## 2017-05-14 VITALS — BP 129/81 | HR 75 | Resp 20 | Ht 65.0 in | Wt 283.2 lb

## 2017-05-14 DIAGNOSIS — I110 Hypertensive heart disease with heart failure: Secondary | ICD-10-CM | POA: Insufficient documentation

## 2017-05-14 DIAGNOSIS — R0683 Snoring: Secondary | ICD-10-CM | POA: Diagnosis not present

## 2017-05-14 DIAGNOSIS — Z7982 Long term (current) use of aspirin: Secondary | ICD-10-CM | POA: Insufficient documentation

## 2017-05-14 DIAGNOSIS — Z880 Allergy status to penicillin: Secondary | ICD-10-CM | POA: Insufficient documentation

## 2017-05-14 DIAGNOSIS — Z79899 Other long term (current) drug therapy: Secondary | ICD-10-CM | POA: Insufficient documentation

## 2017-05-14 DIAGNOSIS — R0602 Shortness of breath: Secondary | ICD-10-CM | POA: Diagnosis not present

## 2017-05-14 DIAGNOSIS — Z8249 Family history of ischemic heart disease and other diseases of the circulatory system: Secondary | ICD-10-CM | POA: Diagnosis not present

## 2017-05-14 DIAGNOSIS — Z9889 Other specified postprocedural states: Secondary | ICD-10-CM | POA: Insufficient documentation

## 2017-05-14 DIAGNOSIS — Z823 Family history of stroke: Secondary | ICD-10-CM | POA: Diagnosis not present

## 2017-05-14 DIAGNOSIS — Z7984 Long term (current) use of oral hypoglycemic drugs: Secondary | ICD-10-CM | POA: Diagnosis not present

## 2017-05-14 DIAGNOSIS — E785 Hyperlipidemia, unspecified: Secondary | ICD-10-CM | POA: Diagnosis not present

## 2017-05-14 DIAGNOSIS — M1990 Unspecified osteoarthritis, unspecified site: Secondary | ICD-10-CM | POA: Diagnosis not present

## 2017-05-14 DIAGNOSIS — R5383 Other fatigue: Secondary | ICD-10-CM | POA: Insufficient documentation

## 2017-05-14 DIAGNOSIS — I5032 Chronic diastolic (congestive) heart failure: Secondary | ICD-10-CM | POA: Insufficient documentation

## 2017-05-14 DIAGNOSIS — R42 Dizziness and giddiness: Secondary | ICD-10-CM | POA: Diagnosis not present

## 2017-05-14 DIAGNOSIS — E119 Type 2 diabetes mellitus without complications: Secondary | ICD-10-CM | POA: Diagnosis not present

## 2017-05-14 DIAGNOSIS — M25562 Pain in left knee: Secondary | ICD-10-CM | POA: Diagnosis not present

## 2017-05-14 DIAGNOSIS — I10 Essential (primary) hypertension: Secondary | ICD-10-CM

## 2017-05-14 LAB — GLUCOSE, CAPILLARY: Glucose-Capillary: 96 mg/dL (ref 65–99)

## 2017-05-14 NOTE — Progress Notes (Signed)
Patient ID: Heather Norman, female    DOB: [name] 10, 1976, 41 y.o.   MRN: [id number]  HPI  Heather Norman is a 41 y/o female with a history of arthritis, DM, hyperlipidemia, HTN, morbid obesity and chronic heart failure.  Reviewed echo report from 05/15/14 which showed an EF of 60-65% along with mild MR and normal PA pressure.  Was in the ED 05/06/17 due to left knee swelling and shortness of breath. She was treated and released.   She presents today for her follow-up visit with a chief complaint of minimal fatigue upon moderate exertion. She describes this as chronic in nature although does feel like it has improved since the clonidine was stopped. She has associated dizziness, difficulty sleeping and left knee pain. She denies any chest pain, shortness of breath edema, palpitations or weight gain.   Past Medical History:  Diagnosis Date  . Arrhythmia   . Arthritis    kness/hands - no meds  . CHF (congestive heart failure) (HCC)   . Diabetes mellitus without complication (HCC)   . Headache(784.0)    otc med prn  . Heart murmur   . Hyperlipidemia    no meds since pregnancy  . Hypertension   . Obesity   . Shortness of breath    with exercise   Past Surgical History:  Procedure Laterality Date  . CERVICAL CERCLAGE  06/01/2011   Procedure: CERCLAGE CERVICAL;  Surgeon: Philip Aspen, DO;  Location: [facility];  Service: Gynecology;  Laterality: N/A;  REQUESTING PORTABLE ULTARSOUND AT BEDSIDE PT'S EDC:12/02/2011  . CERVICAL CERCLAGE  06/01/2011   Procedure: CERCLAGE CERVICAL;  Surgeon: Philip Aspen, DO;  Location: [facility];  Service: Gynecology;  Laterality: N/A;  REQUESTING PORTABLE ULTARSOUND AT BEDSIDE PT'S EDC:12/02/2011  . endosocpy    . svd     x 2  . tab     x 1   Family History  Problem Relation Age of Onset  . Stroke Mother   . Hypertension Mother   . Hyperlipidemia Mother   . Hypertension Father    Social History  Substance Use Topics  . Smoking status: Never Smoker  .  Smokeless tobacco: Never Used  . Alcohol use No   Allergies  Allergen Reactions  . Penicillins Anaphylaxis   Prior to Admission medications   Medication Sig Start Date End Date Taking? Authorizing Provider  amLODipine (NORVASC) 10 MG tablet Take 1 tablet (10 mg total) by mouth daily. 04/30/17  Yes [name], MD  aspirin 81 MG tablet Take 1 tablet (81 mg total) by mouth daily. 03/25/14  Yes Nahser, Deloris Ping, MD  atorvastatin (LIPITOR) 20 MG tablet Take 20 mg by mouth daily.   Yes [provider]  carvedilol (COREG) 25 MG tablet Take 25 mg by mouth 2 (two) times daily with a meal.   Yes [provider]  fluticasone (VERAMYST) 27.5 MCG/SPRAY nasal spray Place 2 sprays into the nose daily.   Yes [provider]  furosemide (LASIX) 40 MG tablet Take 1 tablet (40 mg total) by mouth 2 (two) times daily. 05/01/14  Yes Nahser, Deloris Ping, MD  loratadine (CLARITIN) 10 MG tablet Take 10 mg by mouth daily as needed for allergies.   Yes [provider]  losartan (COZAAR) 100 MG tablet Take 1 tablet (100 mg total) by mouth daily. 08/30/15  Yes Gollan, Tollie Pizza, MD  metFORMIN (GLUCOPHAGE) 500 MG tablet Take 500 mg by mouth 2 (two) times daily with a meal.  Yes [provider]  oxybutynin (DITROPAN-XL) 5 MG 24 hr tablet Take 5 mg by mouth 2 (two) times daily.   Yes [provider]  potassium chloride SA (KLOR-CON M20) 20 MEQ tablet Take 1 tablet (20 mEq total) by mouth 2 (two) times daily. 05/01/14  Yes Nahser, Deloris Ping, MD  traMADol (ULTRAM) 50 MG tablet Take 1 tablet (50 mg total) by mouth every 6 (six) hours as needed. 05/06/17 05/06/18 Yes Emily Filbert, MD    Review of Systems  Constitutional: Positive for fatigue. Negative for appetite change.  HENT: Negative for congestion, postnasal drip and sore throat.   Eyes: Negative.   Respiratory: Negative for cough, chest tightness and shortness of breath.   Cardiovascular: Negative for chest  pain, palpitations and leg swelling.  Gastrointestinal: Negative for abdominal distention and abdominal pain.  Endocrine: Negative.   Genitourinary: Negative.   Musculoskeletal: Positive for arthralgias (left knee pain). Negative for back pain.  Skin: Negative.   Allergic/Immunologic: Negative.   Neurological: Positive for dizziness. Negative for light-headedness.  Hematological: Negative for adenopathy. Does not bruise/bleed easily.  Psychiatric/Behavioral: Positive for sleep disturbance (sleeping on 5 pillows). Negative for dysphoric mood. The patient is not nervous/anxious.    Vitals:   05/14/17 1152  BP: 129/81  Pulse: 75  Resp: 20  SpO2: 98%  Weight: 283 lb 4 oz (128.5 kg)  Height:  (1.651 m)   Wt Readings from Last 3 Encounters:  05/14/17 283 lb 4 oz (128.5 kg)  05/10/17 284 lb (128.8 kg)  05/06/17 287 lb (130.2 kg)    Lab Results  Component Value Date   CREATININE 1.01 (H) 05/10/2017   CREATININE 1.03 (H) 05/06/2017   CREATININE 0.89 12/04/2016   Physical Exam  Constitutional: She is oriented to person, place, and time. She appears well-developed and well-nourished.  HENT:  Head: Normocephalic and atraumatic.  Neck: Normal range of motion. Neck supple. No JVD present.  Cardiovascular: Normal rate and regular rhythm.   Pulmonary/Chest: Effort normal. She has no wheezes. She has no rales.  Abdominal: Soft. She exhibits no distension. There is no tenderness.  Musculoskeletal: She exhibits edema (around left knee only) and tenderness (lateral side of left knee).  Neurological: She is alert and oriented to person, place, and time.  Skin: Skin is warm and dry.  Psychiatric: She has a normal mood and affect. Her behavior is normal. Thought content normal.  Nursing note and vitals reviewed.   Assessment & Plan:  1: Chronic heart failure with preserved ejection fraction- - NYHA class II - euvolemic today - already weighing daily and she says that her weight has  been stable. Reminded to call for an overnight weight gain of >2 pounds or a weekly weight gain of >5 pounds - not adding salt to her food and she does rinse canned vegetables prior to using. Using Mrs. Dash for seasoning and has been reading food labels for sodium content. Reminded to closely follow a  sodium diet and written dietary information was given to her about this - BMP from 05/10/17 reviewed and shows sodium 140, potassium 3.5 and GFR >60 - sees cardiology ([facility]) 05/23/17 - echo ordered for 05/16/17  2: HTN- - BP looks great today even with stopping the clonidine - she says that her fatigue has improved some since stopping the clonidine  3: Diabetes- - taking metformin twice daily - last A1c was 6.6% in 2014 - encouraged her to get updated A1c checked at her PCP's office -  glucose in clinic today was 96  4: Snoring- - says that she's snored for years - wakes up tired and stays tired during the day - she says that she's been told that she stops breathing while she sleeps - she has recently received a message about her sleep study  Patient did not bring her medications nor a list. Each medication was verbally reviewed with the patient and she was encouraged to bring the bottles to every visit to confirm accuracy of list.  Return in 3 months or sooner for any questions/problems before then.

## 2017-05-14 NOTE — Patient Instructions (Signed)
Continue weighing daily and call for an overnight weight gain of > 2 pounds or a weekly weight gain of >5 pounds. 

## 2017-05-16 ENCOUNTER — Ambulatory Visit
Admission: RE | Admit: 2017-05-16 | Discharge: 2017-05-16 | Disposition: A | Discharge status: Home / Self Care | Payer: 59 | Source: Ambulatory Visit | Attending: Family | Admitting: Family

## 2017-05-16 DIAGNOSIS — I420 Dilated cardiomyopathy: Secondary | ICD-10-CM | POA: Insufficient documentation

## 2017-05-16 DIAGNOSIS — I5032 Chronic diastolic (congestive) heart failure: Secondary | ICD-10-CM

## 2017-05-16 DIAGNOSIS — I051 Rheumatic mitral insufficiency: Secondary | ICD-10-CM | POA: Diagnosis not present

## 2017-05-16 NOTE — Progress Notes (Signed)
*  PRELIMINARY RESULTS* Echocardiogram 2D Echocardiogram has been performed.  Cristela Blue 05/16/2017, 10:45 AM

## 2017-05-18 ENCOUNTER — Telehealth: Payer: Self-pay | Admitting: Physician Assistant

## 2017-05-18 NOTE — Telephone Encounter (Signed)
Per Alycia Rossetti patient was seen 10/11 and 10/15 at [facility] and if doing well with no sx can change fu to be seen in 6 m   LMOV for patient to call office

## 2017-05-21 NOTE — Telephone Encounter (Signed)
Spoke with Patient   She report she has no sx at this time and agrees that a 6 m fu would be ok.   Cancelled app 10/24

## 2017-05-23 ENCOUNTER — Ambulatory Visit: Payer: 59 | Admitting: Physician Assistant

## 2017-05-28 DIAGNOSIS — F54 Psychological and behavioral factors associated with disorders or diseases classified elsewhere: Secondary | ICD-10-CM | POA: Insufficient documentation

## 2017-06-28 ENCOUNTER — Other Ambulatory Visit: Payer: Self-pay | Admitting: *Deleted

## 2017-06-28 MED ORDER — FUROSEMIDE 40 MG PO TABS: 40.0000 mg | ORAL_TABLET | Freq: Two times a day (BID) | ORAL | 3 refills | Status: DC

## 2017-06-28 MED ORDER — LOSARTAN POTASSIUM 100 MG PO TABS: 100.0000 mg | ORAL_TABLET | Freq: Every day | ORAL | 3 refills | Status: DC

## 2017-06-28 MED ORDER — ASPIRIN 81 MG PO TABS: 81.0000 mg | ORAL_TABLET | Freq: Every day | ORAL | 3 refills | Status: DC

## 2017-07-02 ENCOUNTER — Telehealth: Payer: Self-pay | Admitting: Physician Assistant

## 2017-07-02 NOTE — Telephone Encounter (Signed)
Please call regarding verbal approval for Metformin 500 mg, and aspirin 81 mg

## 2017-07-02 NOTE — Telephone Encounter (Signed)
Provided verbal order for aspirin 81mg  to Kieu at [facility]. Did not provide verbal approval for metformin as Dr. Mariah Milling did not prescribe.

## 2017-08-12 NOTE — Progress Notes (Signed)
Patient ID: [name], female    DOB: 1976/05/30, 42 y.o.   MRN: [id number]  HPI  Ms Pullium is a 42 y/o female with a history of arthritis, DM, hyperlipidemia, HTN, morbid obesity and chronic heart failure.  Echo report from 05/16/17 reviewed and showed an EF of 55-60% along with mild MR. Reviewed echo report from 05/15/14 which showed an EF of 60-65% along with mild MR and normal PA pressure.  Was in the ED 05/06/17 due to left knee swelling and shortness of breath. She was treated and released.   She presents today for her follow-up visit with a chief complaint of mild shortness of breath upon moderate exertion. She describes this as chronic in nature having been present for several years with varying levels of severity. She feels like it's gotten a little bit worse recently. She has associated fatigue, diarrhea, dizziness, headache and chronic difficultly sleeping along with this. She denies any chest pain, cough, edema, palpitations, abdominal distention, abdominal pain, nausea or weight gain. Says that she feels "bad" when she takes her medications and then feels worse when they wear off. Tends to occur more with the morning medications because when she takes the evening medications, she'll go to bed shortly after taking them.   Past Medical History:  Diagnosis Date  . Arrhythmia   . Arthritis    kness/hands - no meds  . CHF (congestive heart failure) (HCC)   . Diabetes mellitus without complication (HCC)   . Headache(784.0)    otc med prn  . Heart murmur   . Hyperlipidemia    no meds since pregnancy  . Hypertension   . Obesity   . Shortness of breath    with exercise   Past Surgical History:  Procedure Laterality Date  . CERVICAL CERCLAGE  06/01/2011   Procedure: CERCLAGE CERVICAL;  Surgeon: Philip Aspen, DO;  Location: [facility];  Service: Gynecology;  Laterality: N/A;  REQUESTING PORTABLE ULTARSOUND AT BEDSIDE PT'S EDC:12/02/2011  . CERVICAL CERCLAGE  06/01/2011   Procedure: CERCLAGE CERVICAL;  Surgeon: Philip Aspen, DO;  Location: [facility];  Service: Gynecology;  Laterality: N/A;  REQUESTING PORTABLE ULTARSOUND AT BEDSIDE PT'S EDC:12/02/2011  . endosocpy    . svd     x 2  . tab     x 1   Family History  Problem Relation Age of Onset  . Stroke Mother   . Hypertension Mother   . Hyperlipidemia Mother   . Hypertension Father    Social History   Tobacco Use  . Smoking status: Never Smoker  . Smokeless tobacco: Never Used  Substance Use Topics  . Alcohol use: No   Allergies  Allergen Reactions  . Penicillins Anaphylaxis   Prior to Admission medications   Medication Sig Start Date End Date Taking? Authorizing Provider  amLODipine (NORVASC) 10 MG tablet Take 1 tablet (10 mg total) by mouth daily. 04/30/17  Yes [name], MD  aspirin 81 MG tablet Take 1 tablet (81 mg total) by mouth daily. 06/28/17  Yes Gollan, Tollie Pizza, MD  atorvastatin (LIPITOR) 20 MG tablet Take 20 mg by mouth daily.   Yes [provider]  carvedilol (COREG) 25 MG tablet Take 25 mg by mouth 2 (two) times daily with a meal.   Yes [provider]  fluticasone (VERAMYST) 27.5 MCG/SPRAY nasal spray Place 2 sprays into the nose daily.   Yes [provider]  furosemide (LASIX) 40 MG tablet Take 1 tablet (40 mg total)  by mouth 2 (two) times daily. 06/28/17  Yes [name], MD  loratadine (CLARITIN) 10 MG tablet Take 10 mg by mouth daily as needed for allergies.   Yes [provider]  losartan (COZAAR) 100 MG tablet Take 1 tablet (100 mg total) by mouth daily. 06/28/17  Yes [name], MD  metFORMIN (GLUCOPHAGE) 500 MG tablet Take 500 mg by mouth 2 (two) times daily with a meal.   Yes [provider]  oxybutynin (DITROPAN-XL) 5 MG 24 hr tablet Take 5 mg by mouth 2 (two) times daily.   Yes [provider]  potassium chloride SA (KLOR-CON M20) 20 MEQ tablet Take 1 tablet (20 mEq total) by mouth 2 (two) times  daily. 05/01/14  Yes Nahser, Deloris Ping, MD  traMADol (ULTRAM) 50 MG tablet Take 1 tablet (50 mg total) by mouth every 6 (six) hours as needed. 05/06/17 05/06/18 Yes Emily Filbert, MD    Review of Systems  Constitutional: Positive for fatigue. Negative for appetite change.  HENT: Negative for congestion, postnasal drip and sore throat.   Eyes: Negative.   Respiratory: Positive for shortness of breath. Negative for cough and chest tightness.   Cardiovascular: Negative for chest pain, palpitations and leg swelling.  Gastrointestinal: Positive for diarrhea. Negative for abdominal distention and abdominal pain.  Endocrine: Negative.   Genitourinary: Negative.   Musculoskeletal: Positive for arthralgias (left knee pain). Negative for back pain.  Skin: Negative.   Allergic/Immunologic: Negative.   Neurological: Positive for dizziness and headaches. Negative for light-headedness.  Hematological: Negative for adenopathy. Does not bruise/bleed easily.  Psychiatric/Behavioral: Positive for sleep disturbance (sleeping on 5 pillows). Negative for dysphoric mood. The patient is not nervous/anxious.    Vitals:   08/13/17 1100 08/13/17 1103  BP:  (!) 152/98  Pulse: 64   Resp: 18   SpO2: 98%   Weight: 283 lb 8 oz (128.6 kg)   Height: 5\' 4"  (1.626 m)    Wt Readings from Last 3 Encounters:  08/13/17 283 lb 8 oz (128.6 kg)  05/14/17 283 lb 4 oz (128.5 kg)  05/10/17 284 lb (128.8 kg)   Lab Results  Component Value Date   CREATININE 1.01 (H) 05/10/2017   CREATININE 1.03 (H) 05/06/2017   CREATININE 0.89 12/04/2016    Physical Exam  Constitutional: She is oriented to person, place, and time. She appears well-developed and well-nourished.  HENT:  Head: Normocephalic and atraumatic.  Neck: Normal range of motion. Neck supple. No JVD present.  Cardiovascular: Normal rate and regular rhythm.  Pulmonary/Chest: Effort normal. She has no wheezes. She has no rales.  Abdominal: Soft. She exhibits  no distension. There is no tenderness.  Musculoskeletal: She exhibits no edema or tenderness.  Neurological: She is alert and oriented to person, place, and time.  Skin: Skin is warm and dry.  Psychiatric: She has a normal mood and affect. Her behavior is normal. Thought content normal.  Nursing note and vitals reviewed.   Assessment & Plan:  1: Chronic heart failure with preserved ejection fraction- - NYHA class II - euvolemic today - already weighing daily and she says that her weight has been stable. Reminded to call for an overnight weight gain of >2 pounds or a weekly weight gain of >5 pounds - weight stable by our scale - not adding salt to her food and she does rinse canned vegetables prior to using. Using Mrs. Dash for seasoning and has been reading food labels for sodium content. Reminded to closely  follow a 2000mg  sodium diet and written dietary information was given to her about this - BMP from 05/10/17 reviewed and shows sodium 140, potassium 3.5 and GFR >60 - sees cardiology Shea Evans) ~ April 2019 - patient reports receiving her flu vaccine for the season  2: HTN- - BP elevated today and her BP log from work shows a rising BP as well - she was fatigued with clonidine but she is willing to try resuming clonidine 0.1mg  at bedtime only - continue checking her BP at work and recording - sees PCP [facility]) 09/12/17 - doesn't recall having her thyroid level checked and encouraged her to ask PCP about checking that  3: Diabetes- - taking metformin twice daily - last A1c was 6.6% in 2014 - encouraged her to get updated A1c checked at her PCP's office - nonfasting glucose in clinic today was 95  4: Snoring- - says that she's snored for years - wakes up tired and stays tired during the day - she says that she's been told that she stops breathing while she sleeps - she says that she was told that her insurance would not cover CPAP; has new insurance now so she  will let sleep med know about insurance change  5: Diarrhea- - this has been present for several months now & she's not sure if it's related to any of her medications or a GI issue - no new medications and she says that her daughter has had a couple of ear infections and she herself had viral conjunctivitis - follow-up with PCP regarding this for possible GI referral if needed  Patient did not bring her medications nor a list. Each medication was verbally reviewed with the patient and she was encouraged to bring the bottles to every visit to confirm accuracy of list.  Return in 1 month or sooner for any questions/problems before then.

## 2017-08-13 ENCOUNTER — Encounter: Payer: Self-pay | Admitting: Family

## 2017-08-13 ENCOUNTER — Other Ambulatory Visit: Payer: Self-pay

## 2017-08-13 ENCOUNTER — Ambulatory Visit: Payer: Managed Care, Other (non HMO) | Attending: Family | Admitting: Family

## 2017-08-13 VITALS — BP 152/98 | HR 64 | Resp 18 | Ht 64.0 in | Wt 283.5 lb

## 2017-08-13 DIAGNOSIS — R0683 Snoring: Secondary | ICD-10-CM

## 2017-08-13 DIAGNOSIS — Z7982 Long term (current) use of aspirin: Secondary | ICD-10-CM | POA: Diagnosis not present

## 2017-08-13 DIAGNOSIS — Z7984 Long term (current) use of oral hypoglycemic drugs: Secondary | ICD-10-CM | POA: Insufficient documentation

## 2017-08-13 DIAGNOSIS — I499 Cardiac arrhythmia, unspecified: Secondary | ICD-10-CM | POA: Insufficient documentation

## 2017-08-13 DIAGNOSIS — I110 Hypertensive heart disease with heart failure: Secondary | ICD-10-CM | POA: Insufficient documentation

## 2017-08-13 DIAGNOSIS — R197 Diarrhea, unspecified: Secondary | ICD-10-CM | POA: Insufficient documentation

## 2017-08-13 DIAGNOSIS — Z9889 Other specified postprocedural states: Secondary | ICD-10-CM | POA: Insufficient documentation

## 2017-08-13 DIAGNOSIS — E119 Type 2 diabetes mellitus without complications: Secondary | ICD-10-CM | POA: Diagnosis not present

## 2017-08-13 DIAGNOSIS — Z823 Family history of stroke: Secondary | ICD-10-CM | POA: Diagnosis not present

## 2017-08-13 DIAGNOSIS — E785 Hyperlipidemia, unspecified: Secondary | ICD-10-CM | POA: Diagnosis not present

## 2017-08-13 DIAGNOSIS — R51 Headache: Secondary | ICD-10-CM | POA: Insufficient documentation

## 2017-08-13 DIAGNOSIS — Z79899 Other long term (current) drug therapy: Secondary | ICD-10-CM | POA: Insufficient documentation

## 2017-08-13 DIAGNOSIS — Z8249 Family history of ischemic heart disease and other diseases of the circulatory system: Secondary | ICD-10-CM | POA: Insufficient documentation

## 2017-08-13 DIAGNOSIS — Z880 Allergy status to penicillin: Secondary | ICD-10-CM | POA: Diagnosis not present

## 2017-08-13 DIAGNOSIS — M1990 Unspecified osteoarthritis, unspecified site: Secondary | ICD-10-CM | POA: Insufficient documentation

## 2017-08-13 DIAGNOSIS — I5032 Chronic diastolic (congestive) heart failure: Secondary | ICD-10-CM | POA: Diagnosis present

## 2017-08-13 DIAGNOSIS — I10 Essential (primary) hypertension: Secondary | ICD-10-CM

## 2017-08-13 LAB — GLUCOSE, CAPILLARY: Glucose-Capillary: 95 mg/dL (ref 65–99)

## 2017-08-13 NOTE — Patient Instructions (Addendum)
Continue weighing daily and call for an overnight weight gain of > 2 pounds or a weekly weight gain of >5 pounds.  Resume clonidine at 0.1mg  at bedtime.   Discuss possibly getting thyroid levels checked with your primary care doctor.

## 2017-09-12 DIAGNOSIS — R519 Headache, unspecified: Secondary | ICD-10-CM | POA: Insufficient documentation

## 2017-09-21 NOTE — Progress Notes (Signed)
Patient ID: Heather Norman, female    DOB: 1976/03/29, 42 y.o.   MRN: [id number]  HPI  Heather Norman is a 42 y/o female with a history of arthritis, DM, hyperlipidemia, HTN, morbid obesity and chronic heart failure.  Echo report from 05/16/17 reviewed and showed an EF of 55-60% along with mild MR. Reviewed echo report from 05/15/14 which showed an EF of 60-65% along with mild MR and normal PA pressure.  Was in the ED 05/06/17 due to left knee swelling and shortness of breath. She was treated and released.   She presents today for her follow-up visit with a chief complaint of minimal shortness of breath upon moderate exertion. She describes this as chronic in nature having been present for several years. She does feel like her breathing has been getting better, however. She has associated fatigue, headaches and difficulty sleeping along with this. She denies any chest pain, cough, edema, palpitations, abdominal distention, dizziness or weight gain.   Past Medical History:  Diagnosis Date  . Arrhythmia   . Arthritis    kness/hands - no meds  . CHF (congestive heart failure) (HCC)   . Diabetes mellitus without complication (HCC)   . Headache(784.0)    otc med prn  . Heart murmur   . Hyperlipidemia    no meds since pregnancy  . Hypertension   . Obesity   . Shortness of breath    with exercise   Past Surgical History:  Procedure Laterality Date  . CERVICAL CERCLAGE  06/01/2011   Procedure: CERCLAGE CERVICAL;  Surgeon: Philip Aspen, DO;  Location: [facility];  Service: Gynecology;  Laterality: N/A;  REQUESTING PORTABLE ULTARSOUND AT BEDSIDE PT'S EDC:12/02/2011  . CERVICAL CERCLAGE  06/01/2011   Procedure: CERCLAGE CERVICAL;  Surgeon: Philip Aspen, DO;  Location: [facility];  Service: Gynecology;  Laterality: N/A;  REQUESTING PORTABLE ULTARSOUND AT BEDSIDE PT'S EDC:12/02/2011  . endosocpy    . svd     x 2  . tab     x 1   Family History  Problem Relation Age of Onset  . Stroke Mother    . Hypertension Mother   . Hyperlipidemia Mother   . Hypertension Father    Social History   Tobacco Use  . Smoking status: Never Smoker  . Smokeless tobacco: Never Used  Substance Use Topics  . Alcohol use: No   Allergies  Allergen Reactions  . Penicillins Anaphylaxis   Prior to Admission medications   Medication Sig Start Date End Date Taking? Authorizing Provider  amLODipine (NORVASC) 10 MG tablet Take 1 tablet (10 mg total) by mouth daily. 04/30/17  Yes [name], MD  aspirin 81 MG tablet Take 1 tablet (81 mg total) by mouth daily. 06/28/17  Yes Gollan, Tollie Pizza, MD  atorvastatin (LIPITOR) 20 MG tablet Take 20 mg by mouth daily. 1 tab po QHS   Yes [provider]  carvedilol (COREG) 25 MG tablet Take 25 mg by mouth 2 (two) times daily with a meal.   Yes [provider]  cloNIDine (CATAPRES) 0.1 MG tablet Take 0.1 mg by mouth at bedtime. One tab PO BID   Yes [provider]  fluticasone (VERAMYST) 27.5 MCG/SPRAY nasal spray Place 2 sprays into the nose daily. Takes as needed   Yes [provider]  furosemide (LASIX) 40 MG tablet Take 1 tablet (40 mg total) by mouth 2 (two) times daily. 06/28/17  Yes [name], MD  loratadine (CLARITIN) 10 MG tablet  Take 10 mg by mouth daily as needed for allergies. Takes daily   Yes [provider]  losartan (COZAAR) 100 MG tablet Take 1 tablet (100 mg total) by mouth daily. 06/28/17  Yes [name], MD  metFORMIN (GLUCOPHAGE) 500 MG tablet Take 500 mg by mouth 2 (two) times daily with a meal.   Yes [provider]  oxybutynin (DITROPAN-XL) 5 MG 24 hr tablet Take 5 mg by mouth 2 (two) times daily.   Yes [provider]  potassium chloride SA (KLOR-CON M20) 20 MEQ tablet Take 1 tablet (20 mEq total) by mouth 2 (two) times daily. 05/01/14  Yes Nahser, Deloris Ping, MD  traMADol (ULTRAM) 50 MG tablet Take 1 tablet (50 mg total) by mouth every 6 (six) hours as  needed. Patient not taking: Reported on 09/24/2017 05/06/17 05/06/18  Emily Filbert, MD    Review of Systems  Constitutional: Positive for fatigue. Negative for appetite change.  HENT: Negative for congestion, postnasal drip and sore throat.        Dry mouth  Eyes: Negative.   Respiratory: Positive for shortness of breath (better). Negative for cough and chest tightness.   Cardiovascular: Negative for chest pain, palpitations and leg swelling.  Gastrointestinal: Negative for abdominal distention, abdominal pain and diarrhea.  Endocrine: Negative.   Genitourinary: Negative.   Musculoskeletal: Negative for back pain and neck pain.  Skin: Negative.   Allergic/Immunologic: Negative.   Neurological: Positive for headaches. Negative for dizziness and light-headedness.  Hematological: Negative for adenopathy. Does not bruise/bleed easily.  Psychiatric/Behavioral: Positive for sleep disturbance (sleeping on 5 pillows). Negative for dysphoric mood. The patient is not nervous/anxious.    Vitals:   09/24/17 0905  BP: (!) 168/90  Pulse: 92  Resp: 18  SpO2: 99%  Weight: 283 lb 4 oz (128.5 kg)  Height: 5\' 4"  (1.626 m)   Wt Readings from Last 3 Encounters:  09/24/17 283 lb 4 oz (128.5 kg)  08/13/17 283 lb 8 oz (128.6 kg)  05/14/17 283 lb 4 oz (128.5 kg)   Lab Results  Component Value Date   CREATININE 1.01 (H) 05/10/2017   CREATININE 1.03 (H) 05/06/2017   CREATININE 0.89 12/04/2016    Physical Exam  Constitutional: She is oriented to person, place, and time. She appears well-developed and well-nourished.  HENT:  Head: Normocephalic and atraumatic.  Neck: Normal range of motion. Neck supple. No JVD present.  Cardiovascular: Normal rate and regular rhythm.  Pulmonary/Chest: Effort normal. She has no wheezes. She has no rales.  Abdominal: Soft. She exhibits no distension. There is no tenderness.  Musculoskeletal: She exhibits no edema or tenderness.  Neurological: She is alert  and oriented to person, place, and time.  Skin: Skin is warm and dry.  Psychiatric: She has a normal mood and affect. Her behavior is normal. Thought content normal.  Nursing note and vitals reviewed.   Assessment & Plan:  1: Chronic heart failure with preserved ejection fraction- - NYHA class II - euvolemic today - weighing daily and she says that her weight has been stable. Reminded to call for an overnight weight gain of >2 pounds or a weekly weight gain of >5 pounds - weight stable by our scale - not adding salt to her food and she does rinse canned vegetables prior to using. Using Mrs. Dash for seasoning and has been reading food labels for sodium content. Reminded to closely follow a 2000mg  sodium diet  - sees cardiology Heather Norman) ~ April 2019 -  patient reports receiving her flu vaccine for the season  2: HTN- - BP elevated today and her BP log from home shows BP 160-180's/90-100's - has been taking clonidine BID without difficulty - discussed increasing her clonidine again or adding hydralazine. She wants to try increasing her clonidine. Will increase it to 0.2mg  twice daily and she will take it as 0.1mg  AM, 0.1mg  at lunch and 0.2mg  at bedtime (per her preference) - saw PCP [facility]) 09/12/17 & returns 10/24/17 - BMP from 05/10/17 reviewed and shows sodium 140, potassium 3.5 and GFR >60  3: Diabetes- - taking metformin twice daily - last A1c was 6.6% in 2014 - encouraged her to get updated A1c checked at her PCP's office  4: Snoring- - says that she's snored for years - wakes up tired and stays tired during the day - she says that she's been told that she stops breathing while she sleeps - she hasn't called [facility] to give her new insurance information to them to see if CPAP will now be covered. Encouraged her to call them.  Patient did not bring her medications nor a list. Each medication was verbally reviewed with the patient and she was encouraged to bring  the bottles to every visit to confirm accuracy of list.  Since she is seeing her PCP next month, will have her follow-up here in 2 months.

## 2017-09-24 ENCOUNTER — Encounter: Payer: Self-pay | Admitting: Family

## 2017-09-24 ENCOUNTER — Ambulatory Visit: Payer: Managed Care, Other (non HMO) | Attending: Family | Admitting: Family

## 2017-09-24 VITALS — BP 168/90 | HR 92 | Resp 18 | Ht 64.0 in | Wt 283.2 lb

## 2017-09-24 DIAGNOSIS — M1990 Unspecified osteoarthritis, unspecified site: Secondary | ICD-10-CM | POA: Insufficient documentation

## 2017-09-24 DIAGNOSIS — I509 Heart failure, unspecified: Secondary | ICD-10-CM | POA: Diagnosis present

## 2017-09-24 DIAGNOSIS — E119 Type 2 diabetes mellitus without complications: Secondary | ICD-10-CM | POA: Insufficient documentation

## 2017-09-24 DIAGNOSIS — R0683 Snoring: Secondary | ICD-10-CM | POA: Diagnosis not present

## 2017-09-24 DIAGNOSIS — Z823 Family history of stroke: Secondary | ICD-10-CM | POA: Diagnosis not present

## 2017-09-24 DIAGNOSIS — Z8249 Family history of ischemic heart disease and other diseases of the circulatory system: Secondary | ICD-10-CM | POA: Insufficient documentation

## 2017-09-24 DIAGNOSIS — I5032 Chronic diastolic (congestive) heart failure: Secondary | ICD-10-CM

## 2017-09-24 DIAGNOSIS — Z6841 Body Mass Index (BMI) 40.0 and over, adult: Secondary | ICD-10-CM | POA: Insufficient documentation

## 2017-09-24 DIAGNOSIS — I110 Hypertensive heart disease with heart failure: Secondary | ICD-10-CM | POA: Diagnosis not present

## 2017-09-24 DIAGNOSIS — Z880 Allergy status to penicillin: Secondary | ICD-10-CM | POA: Diagnosis not present

## 2017-09-24 DIAGNOSIS — Z79899 Other long term (current) drug therapy: Secondary | ICD-10-CM | POA: Insufficient documentation

## 2017-09-24 DIAGNOSIS — I10 Essential (primary) hypertension: Secondary | ICD-10-CM

## 2017-09-24 DIAGNOSIS — E785 Hyperlipidemia, unspecified: Secondary | ICD-10-CM | POA: Diagnosis not present

## 2017-09-24 DIAGNOSIS — Z7982 Long term (current) use of aspirin: Secondary | ICD-10-CM | POA: Insufficient documentation

## 2017-09-24 DIAGNOSIS — Z7984 Long term (current) use of oral hypoglycemic drugs: Secondary | ICD-10-CM | POA: Insufficient documentation

## 2017-09-24 NOTE — Patient Instructions (Addendum)
Continue weighing daily and call for an overnight weight gain of > 2 pounds or a weekly weight gain of >5 pounds.  Increase clonidine to 0.2mg  twice daily. Can take 0.1mg  Am, lunctime and then 0.2 mg at bedtime

## 2017-11-13 NOTE — Progress Notes (Signed)
Patient ID: Heather Norman, female    DOB: 1976-01-12, 42 y.o.   MRN: [id number]  HPI  Heather Norman is a 42 y/o female with a history of arthritis, DM, hyperlipidemia, HTN, morbid obesity and chronic heart failure.  Echo report from 05/16/17 reviewed and showed an EF of 55-60% along with mild MR. Reviewed echo report from 05/15/14 which showed an EF of 60-65% along with mild MR and normal PA pressure.  Has not been in the ED or admitted in the last 6 months.   She presents today for her follow-up visit with a chief complaint of minimal shortness of breath upon moderate exertion. She says that she notices this when she's walking up an incline. She has associated fatigue, chest tightness, headaches, light-headedness and chronic difficulty sleeping along with this. She denies any abdominal distention, palpitations, edema, chest pain, wheezing or weight gain. Has only been weighing herself on a weekly basis. Las used her albuterol inhaler ~ 5 days ago.   Past Medical History:  Diagnosis Date  . Arrhythmia   . Arthritis    kness/hands - no meds  . CHF (congestive heart failure) (HCC)   . Diabetes mellitus without complication (HCC)   . Headache(784.0)    otc med prn  . Heart murmur   . Hyperlipidemia    no meds since pregnancy  . Hypertension   . Obesity   . Shortness of breath    with exercise   Past Surgical History:  Procedure Laterality Date  . CERVICAL CERCLAGE  06/01/2011   Procedure: CERCLAGE CERVICAL;  Surgeon: Philip Aspen, DO;  Location: [facility];  Service: Gynecology;  Laterality: N/A;  REQUESTING PORTABLE ULTARSOUND AT BEDSIDE PT'S EDC:12/02/2011  . CERVICAL CERCLAGE  06/01/2011   Procedure: CERCLAGE CERVICAL;  Surgeon: Philip Aspen, DO;  Location: [facility];  Service: Gynecology;  Laterality: N/A;  REQUESTING PORTABLE ULTARSOUND AT BEDSIDE PT'S EDC:12/02/2011  . endosocpy    . svd     x 2  . tab     x 1   Family History  Problem Relation Age of Onset  . Stroke  Mother   . Hypertension Mother   . Hyperlipidemia Mother   . Hypertension Father    Social History   Tobacco Use  . Smoking status: Never Smoker  . Smokeless tobacco: Never Used  Substance Use Topics  . Alcohol use: No   Allergies  Allergen Reactions  . Penicillins Anaphylaxis   Prior to Admission medications   Medication Sig Start Date End Date Taking? Authorizing Provider  albuterol (PROVENTIL HFA;VENTOLIN HFA) 108 (90 Base) MCG/ACT inhaler Inhale 2 puffs into the lungs every 4 (four) hours as needed for wheezing or shortness of breath. q4-6 h PRN for shortness of breath   Yes [provider]  amLODipine (NORVASC) 10 MG tablet Take 1 tablet (10 mg total) by mouth daily. 04/30/17  Yes [name], MD  aspirin 81 MG tablet Take 1 tablet (81 mg total) by mouth daily. 06/28/17  Yes Gollan, Tollie Pizza, MD  atorvastatin (LIPITOR) 20 MG tablet Take 20 mg by mouth daily. 1 tab po QHS   Yes [provider]  carvedilol (COREG) 25 MG tablet Take 25 mg by mouth 2 (two) times daily with a meal.   Yes [provider]  cloNIDine (CATAPRES) 0.1 MG tablet Take by mouth. 0.1mg  AM, 0.1mg  at lunch and 0.2mg  at bedtime   Yes [provider]  fluticasone (VERAMYST) 27.5 MCG/SPRAY nasal spray Place 2  sprays into the nose daily. Takes as needed   Yes [provider]  furosemide (LASIX) 40 MG tablet Take 1 tablet (40 mg total) by mouth 2 (two) times daily. 06/28/17  Yes [name], MD  loratadine (CLARITIN) 10 MG tablet Take 10 mg by mouth daily as needed for allergies. Takes daily   Yes [provider]  losartan (COZAAR) 100 MG tablet Take 1 tablet (100 mg total) by mouth daily. 06/28/17  Yes Gollan, Tollie Pizza, MD  metFORMIN (GLUCOPHAGE) 500 MG tablet Take 500 mg by mouth 2 (two) times daily with a meal.   Yes [provider]  Multiple Vitamin (MULTIVITAMIN) LIQD Take 15 mLs by mouth daily. Nutra Burst liquid vitamin   Yes [provider]  oxybutynin (DITROPAN-XL) 5 MG 24 hr tablet Take 5 mg by mouth 2 (two) times daily.   Yes [provider]  potassium chloride SA (KLOR-CON M20) 20 MEQ tablet Take 1 tablet (20 mEq total) by mouth 2 (two) times daily. 05/01/14  Yes Nahser, Deloris Ping, MD    Review of Systems  Constitutional: Positive for fatigue. Negative for appetite change.  HENT: Negative for congestion, postnasal drip and sore throat.        Dry mouth  Eyes: Negative.   Respiratory: Positive for chest tightness (when walking up incline) and shortness of breath (with incline). Negative for cough and wheezing.   Cardiovascular: Negative for chest pain, palpitations and leg swelling.  Gastrointestinal: Negative for abdominal distention, abdominal pain and diarrhea.  Endocrine: Negative.   Genitourinary: Negative.   Musculoskeletal: Negative for back pain and neck pain.  Skin: Negative.   Allergic/Immunologic: Negative.   Neurological: Positive for light-headedness and headaches (improving). Negative for dizziness.  Hematological: Negative for adenopathy. Does not bruise/bleed easily.  Psychiatric/Behavioral: Positive for sleep disturbance (sleeping on 5 pillows). Negative for dysphoric mood. The patient is not nervous/anxious.    Vitals:   11/19/17 0909  BP: (!) 150/88  Pulse: 74  Resp: 18  SpO2: 99%  Weight: 285 lb (129.3 kg)  Height: 5\' 5"  (1.651 m)   Wt Readings from Last 3 Encounters:  11/19/17 285 lb (129.3 kg)  09/24/17 283 lb 4 oz (128.5 kg)  08/13/17 283 lb 8 oz (128.6 kg)   Lab Results  Component Value Date   CREATININE 1.01 (H) 05/10/2017   CREATININE 1.03 (H) 05/06/2017   CREATININE 0.89 12/04/2016    Physical Exam  Constitutional: She is oriented to person, place, and time. She appears well-developed and well-nourished.  HENT:  Head: Normocephalic and atraumatic.  Neck: Normal range of motion. Neck supple. No JVD present.  Cardiovascular: Normal rate and regular rhythm.   Pulmonary/Chest: Effort normal. She has no wheezes. She has no rales.  Abdominal: Soft. She exhibits no distension. There is no tenderness.  Musculoskeletal: She exhibits no edema or tenderness.  Neurological: She is alert and oriented to person, place, and time.  Skin: Skin is warm and dry.  Psychiatric: She has a normal mood and affect. Her behavior is normal. Thought content normal.  Nursing note and vitals reviewed.   Assessment & Plan:  1: Chronic heart failure with preserved ejection fraction- - NYHA class II - euvolemic today - weighing weekly and she says that her weight has been stable. Instructed her to resume weighing daily so that she can call for an overnight weight gain of >2 pounds or a weekly weight gain of >5 pounds - weight stable  - not adding salt  to her food and she does rinse canned vegetables prior to using. Using Heather Norman for seasoning and has been reading food labels for sodium content. Reminded to closely follow a 2000mg  sodium diet  - saw cardiology Mariah Milling) 12/04/16 - is going to be going to [facility] in ~ 10 days. Encouraged her to allow for frequent rest periods along with monitoring her sodium intake closely.  - PharmD reconciled medications with the patient  2: HTN- - BP mildly elevated today - has been taking her clonidine 0.2mg  at bedtime (per her preference) only as taking it the other times of the day made her extremely sleepy - due to recall, will change her losartan to valsartan 160mg  daily. Advised her that she will stop taking losartan when she begins the valsartan - saw PCP [facility]) 09/12/17 and needs to make an appointment - BMP from 05/10/17 reviewed and shows sodium 140, potassium 3.5 and GFR >60  3: Diabetes-  - taking metformin twice daily - last A1c was 6.6% in 2014 - nonfasting glucose in the clinic today was 124 - she does not have a glucometer and she was encouraged to follow-up with her PCP to get a  prescription from them.   4: Snoring- - says that she's snored for years - wakes up tired and stays tired during the day - she says that she's been told that she stops breathing while she sleeps - she still hasn't called [facility] to give her new insurance information to them to see if CPAP will now be covered. Number to [facility] given to patient and encouraged her to call them   Patient did not bring her medications nor a list. Each medication was verbally reviewed with the patient and she was encouraged to bring the bottles to every visit to confirm accuracy of list.  Return in 1 month or sooner for any questions/problems before then.

## 2017-11-19 ENCOUNTER — Encounter: Payer: Self-pay | Admitting: Family

## 2017-11-19 ENCOUNTER — Ambulatory Visit: Payer: Managed Care, Other (non HMO) | Attending: Family | Admitting: Family

## 2017-11-19 VITALS — BP 150/88 | HR 74 | Resp 18 | Ht 65.0 in | Wt 285.0 lb

## 2017-11-19 DIAGNOSIS — R0683 Snoring: Secondary | ICD-10-CM | POA: Insufficient documentation

## 2017-11-19 DIAGNOSIS — I509 Heart failure, unspecified: Secondary | ICD-10-CM | POA: Diagnosis present

## 2017-11-19 DIAGNOSIS — I110 Hypertensive heart disease with heart failure: Secondary | ICD-10-CM | POA: Diagnosis not present

## 2017-11-19 DIAGNOSIS — I5032 Chronic diastolic (congestive) heart failure: Secondary | ICD-10-CM | POA: Insufficient documentation

## 2017-11-19 DIAGNOSIS — Z7984 Long term (current) use of oral hypoglycemic drugs: Secondary | ICD-10-CM | POA: Insufficient documentation

## 2017-11-19 DIAGNOSIS — I10 Essential (primary) hypertension: Secondary | ICD-10-CM

## 2017-11-19 DIAGNOSIS — Z79899 Other long term (current) drug therapy: Secondary | ICD-10-CM | POA: Diagnosis not present

## 2017-11-19 DIAGNOSIS — Z880 Allergy status to penicillin: Secondary | ICD-10-CM | POA: Diagnosis not present

## 2017-11-19 DIAGNOSIS — E785 Hyperlipidemia, unspecified: Secondary | ICD-10-CM | POA: Insufficient documentation

## 2017-11-19 DIAGNOSIS — Z8249 Family history of ischemic heart disease and other diseases of the circulatory system: Secondary | ICD-10-CM | POA: Insufficient documentation

## 2017-11-19 DIAGNOSIS — Z6841 Body Mass Index (BMI) 40.0 and over, adult: Secondary | ICD-10-CM | POA: Diagnosis not present

## 2017-11-19 DIAGNOSIS — Z7982 Long term (current) use of aspirin: Secondary | ICD-10-CM | POA: Diagnosis not present

## 2017-11-19 DIAGNOSIS — E119 Type 2 diabetes mellitus without complications: Secondary | ICD-10-CM | POA: Insufficient documentation

## 2017-11-19 DIAGNOSIS — M1990 Unspecified osteoarthritis, unspecified site: Secondary | ICD-10-CM | POA: Insufficient documentation

## 2017-11-19 DIAGNOSIS — Z823 Family history of stroke: Secondary | ICD-10-CM | POA: Insufficient documentation

## 2017-11-19 LAB — GLUCOSE, CAPILLARY: Glucose-Capillary: 124 mg/dL — ABNORMAL HIGH (ref 65–99)

## 2017-11-19 MED ORDER — VALSARTAN 160 MG PO TABS: 160.0000 mg | ORAL_TABLET | Freq: Every day | ORAL | 3 refills | Status: DC

## 2017-11-19 NOTE — Patient Instructions (Addendum)
Continue weighing daily and call for an overnight weight gain of > 2 pounds or a weekly weight gain of >5 pounds.  Stop losartan and begin valsartan 160mg  daily.  Call [facility] at [phone] to see about rescheduling your sleep study.  Call primary care provider to schedule an appointment to discuss your albuterol inhaler and the need for a glucometer prescription

## 2017-12-06 ENCOUNTER — Other Ambulatory Visit: Payer: Self-pay | Admitting: Cardiovascular Disease

## 2017-12-07 ENCOUNTER — Encounter: Payer: Self-pay | Admitting: Emergency Medicine

## 2017-12-10 ENCOUNTER — Encounter: Admission: RE | Payer: Self-pay | Source: Ambulatory Visit

## 2017-12-10 ENCOUNTER — Ambulatory Visit
Admission: RE | Admit: 2017-12-10 | Payer: Managed Care, Other (non HMO) | Source: Ambulatory Visit | Admitting: Gastroenterology

## 2017-12-10 SURGERY — COLONOSCOPY WITH PROPOFOL
Anesthesia: General

## 2017-12-15 ENCOUNTER — Other Ambulatory Visit: Payer: Self-pay | Admitting: Cardiovascular Disease

## 2017-12-16 NOTE — Progress Notes (Signed)
Patient ID: Heather Norman, female    DOB: 11-20-1975, 42 y.o.   MRN: [id number]  HPI  Heather Norman is a 42 y/o female with a history of arthritis, DM, hyperlipidemia, HTN, morbid obesity and chronic heart failure.  Echo report from 05/16/17 reviewed and showed an EF of 55-60% along with mild MR. Reviewed echo report from 05/15/14 which showed an EF of 60-65% along with mild MR and normal PA pressure.  Has not been in the ED or admitted in the last 6 months.   She presents today for her follow-up visit with a chief complaint of minimal fatigue upon moderate exertion. She says that this has been present for several years although she says that her energy level has improved since she was last here. She has associated difficulty sleeping with intermittent headaches along with this. She denies any abdominal distention, palpitations, pedal edema, chest pain, shortness of breath, cough, dizziness or weight gain. She is no longer taking the clonidine and says that she feels so much better.   Past Medical History:  Diagnosis Date  . Arrhythmia   . Arthritis    kness/hands - no meds  . CHF (congestive heart failure) (HCC)   . Diabetes mellitus without complication (HCC)   . Headache(784.0)    otc med prn  . Heart murmur   . Hyperlipidemia    no meds since pregnancy  . Hypertension   . Obesity   . Shortness of breath    with exercise   Past Surgical History:  Procedure Laterality Date  . CERVICAL CERCLAGE  06/01/2011   Procedure: CERCLAGE CERVICAL;  Surgeon: Philip Aspen, DO;  Location: [facility];  Service: Gynecology;  Laterality: N/A;  REQUESTING PORTABLE ULTARSOUND AT BEDSIDE PT'S EDC:12/02/2011  . CERVICAL CERCLAGE  06/01/2011   Procedure: CERCLAGE CERVICAL;  Surgeon: Philip Aspen, DO;  Location: [facility];  Service: Gynecology;  Laterality: N/A;  REQUESTING PORTABLE ULTARSOUND AT BEDSIDE PT'S EDC:12/02/2011  . endosocpy    . svd     x 2  . tab     x 1  . TUBAL LIGATION     Family  History  Problem Relation Age of Onset  . Stroke Mother   . Hypertension Mother   . Hyperlipidemia Mother   . Hypertension Father    Social History   Tobacco Use  . Smoking status: Never Smoker  . Smokeless tobacco: Never Used  Substance Use Topics  . Alcohol use: No   Allergies  Allergen Reactions  . Penicillins Anaphylaxis   Prior to Admission medications   Medication Sig Start Date End Date Taking? Authorizing Provider  amLODipine (NORVASC) 10 MG tablet TAKE 1 TABLET BY MOUTH  DAILY 12/07/17  Yes Gollan, Tollie Pizza, MD  aspirin 81 MG tablet Take 1 tablet (81 mg total) by mouth daily. 06/28/17  Yes Gollan, Tollie Pizza, MD  atorvastatin (LIPITOR) 20 MG tablet Take 20 mg by mouth daily. 1 tab po QHS   Yes [provider]  carvedilol (COREG) 25 MG tablet Take 25 mg by mouth 2 (two) times daily with a meal.   Yes [provider]  fluticasone (VERAMYST) 27.5 MCG/SPRAY nasal spray Place 2 sprays into the nose daily. Takes as needed   Yes [provider]  furosemide (LASIX) 40 MG tablet Take 1 tablet (40 mg total) by mouth 2 (two) times daily. 06/28/17  Yes Gollan, Tollie Pizza, MD  hyoscyamine (LEVSIN SL) 0.125 MG SL tablet Place 0.125 mg under the tongue  every 6 (six) hours as needed.   Yes [provider]  loratadine (CLARITIN) 10 MG tablet Take 10 mg by mouth daily as needed for allergies. Takes daily   Yes [provider]  metFORMIN (GLUCOPHAGE) 500 MG tablet Take 500 mg by mouth 2 (two) times daily with a meal.   Yes [provider]  Multiple Vitamin (MULTIVITAMIN) LIQD Take 15 mLs by mouth daily. Nutra Burst liquid vitamin   Yes [provider]  omeprazole (PRILOSEC) 40 MG capsule Take 40 mg by mouth daily.   Yes [provider]  oxybutynin (DITROPAN-XL) 5 MG 24 hr tablet Take 5 mg by mouth 2 (two) times daily.   Yes [provider]  polyethylene glycol-electrolytes (NULYTELY/GOLYTELY) 420 g solution Take  4,000 mLs by mouth once.   Yes [provider]  potassium chloride SA (KLOR-CON M20) 20 MEQ tablet Take 1 tablet (20 mEq total) by mouth 2 (two) times daily. 05/01/14  Yes Nahser, Deloris Ping, MD  traMADol (ULTRAM) 50 MG tablet Take 1 tablet (50 mg total) by mouth every 6 (six) hours as needed. 05/06/17 05/06/18 Yes Emily Filbert, MD  valsartan (DIOVAN) 160 MG tablet Take 1 tablet (160 mg total) by mouth daily. 11/19/17  Yes Hackney, Inetta Fermo A, FNP  albuterol (PROVENTIL HFA;VENTOLIN HFA) 108 (90 Base) MCG/ACT inhaler Inhale 2 puffs into the lungs every 4 (four) hours as needed for wheezing or shortness of breath. q4-6 h PRN for shortness of breath    [provider]    Review of Systems  Constitutional: Positive for fatigue. Negative for appetite change.  HENT: Negative for congestion, postnasal drip and sore throat.   Eyes: Negative.   Respiratory: Negative for cough, chest tightness, shortness of breath and wheezing.   Cardiovascular: Negative for chest pain, palpitations and leg swelling.  Gastrointestinal: Negative for abdominal distention, abdominal pain and diarrhea.  Endocrine: Negative.   Genitourinary: Negative.   Musculoskeletal: Negative for back pain and neck pain.  Skin: Negative.   Allergic/Immunologic: Negative.   Neurological: Positive for headaches (improving). Negative for dizziness and light-headedness.  Hematological: Negative for adenopathy. Does not bruise/bleed easily.  Psychiatric/Behavioral: Positive for sleep disturbance (sleeping on 5 pillows). Negative for dysphoric mood. The patient is not nervous/anxious.    Vitals:   12/17/17 1240  BP: (!) 142/86  Pulse: 73  Resp: 18  SpO2: 99%  Weight: 281 lb 2 oz (127.5 kg)  Height: 5\' 5"  (1.651 m)   Wt Readings from Last 3 Encounters:  12/17/17 281 lb 2 oz (127.5 kg)  11/19/17 285 lb (129.3 kg)  09/24/17 283 lb 4 oz (128.5 kg)   Lab Results  Component Value Date   CREATININE 1.01 (H) 05/10/2017    CREATININE 1.03 (H) 05/06/2017   CREATININE 0.89 12/04/2016    Physical Exam  Constitutional: She is oriented to person, place, and time. She appears well-developed and well-nourished.  HENT:  Head: Normocephalic and atraumatic.  Neck: Normal range of motion. Neck supple. No JVD present.  Cardiovascular: Normal rate and regular rhythm.  Pulmonary/Chest: Effort normal. She has no wheezes. She has no rales.  Abdominal: Soft. She exhibits no distension. There is no tenderness.  Musculoskeletal: She exhibits no edema or tenderness.  Neurological: She is alert and oriented to person, place, and time.  Skin: Skin is warm and dry.  Psychiatric: She has a normal mood and affect. Her behavior is normal. Thought content normal.  Nursing note and vitals reviewed.   Assessment & Plan:  1: Chronic heart failure with preserved ejection fraction- - NYHA class II - euvolemic today - weighing daily. Reminded about weighing daily so that she can call for an overnight weight gain of >2 pounds or a weekly weight gain of >5 pounds - weight down 4 pounds since she was last here - not adding salt to her food and she does rinse canned vegetables prior to using. Using Mrs. Dash for seasoning and has been reading food labels for sodium content. Reminded to closely follow a 2000mg  sodium diet  - saw cardiology Mariah Milling) 12/04/16 - PharmD reconciled medications with the patient  2: HTN- - BP looks good today - no longer taking the clonidine - losartan changed to 160mg  valsartan at her last visit - saw PCP [facility]) 09/12/17 and returns 12/20/17 - BMP from 05/10/17 reviewed and shows sodium 140, potassium 3.5 and GFR >60  3: Diabetes-  - taking metformin twice daily - last A1c was 6.6% in 2014 - she does not have a glucometer and she was encouraged to follow-up with her PCP to get a prescription from them.   4: Snoring- - says that she's snored for years - wakes up tired and stays  tired during the day although she says that her energy level has improved - she says that she's been told that she stops breathing while she sleeps - she has called [facility] and left a message but hasn't heard anything back from them. She will call again  Patient did not bring her medications nor a list. Each medication was verbally reviewed with the patient and she was encouraged to bring the bottles to every visit to confirm accuracy of list.  Return in 3 months or sooner for any questions/problems before then.

## 2017-12-17 ENCOUNTER — Ambulatory Visit: Payer: Managed Care, Other (non HMO) | Attending: Family | Admitting: Family

## 2017-12-17 ENCOUNTER — Encounter: Payer: Self-pay | Admitting: Family

## 2017-12-17 VITALS — BP 142/86 | HR 73 | Resp 18 | Ht 65.0 in | Wt 281.1 lb

## 2017-12-17 DIAGNOSIS — I110 Hypertensive heart disease with heart failure: Secondary | ICD-10-CM | POA: Insufficient documentation

## 2017-12-17 DIAGNOSIS — Z7984 Long term (current) use of oral hypoglycemic drugs: Secondary | ICD-10-CM | POA: Insufficient documentation

## 2017-12-17 DIAGNOSIS — M1990 Unspecified osteoarthritis, unspecified site: Secondary | ICD-10-CM | POA: Insufficient documentation

## 2017-12-17 DIAGNOSIS — Z79899 Other long term (current) drug therapy: Secondary | ICD-10-CM | POA: Insufficient documentation

## 2017-12-17 DIAGNOSIS — R51 Headache: Secondary | ICD-10-CM | POA: Insufficient documentation

## 2017-12-17 DIAGNOSIS — E785 Hyperlipidemia, unspecified: Secondary | ICD-10-CM | POA: Insufficient documentation

## 2017-12-17 DIAGNOSIS — I5032 Chronic diastolic (congestive) heart failure: Secondary | ICD-10-CM | POA: Insufficient documentation

## 2017-12-17 DIAGNOSIS — R5383 Other fatigue: Secondary | ICD-10-CM | POA: Diagnosis present

## 2017-12-17 DIAGNOSIS — Z7982 Long term (current) use of aspirin: Secondary | ICD-10-CM | POA: Diagnosis not present

## 2017-12-17 DIAGNOSIS — E119 Type 2 diabetes mellitus without complications: Secondary | ICD-10-CM | POA: Diagnosis not present

## 2017-12-17 DIAGNOSIS — R0683 Snoring: Secondary | ICD-10-CM | POA: Diagnosis not present

## 2017-12-17 DIAGNOSIS — Z880 Allergy status to penicillin: Secondary | ICD-10-CM | POA: Insufficient documentation

## 2017-12-17 DIAGNOSIS — Z8249 Family history of ischemic heart disease and other diseases of the circulatory system: Secondary | ICD-10-CM | POA: Insufficient documentation

## 2017-12-17 DIAGNOSIS — I10 Essential (primary) hypertension: Secondary | ICD-10-CM

## 2017-12-17 NOTE — Telephone Encounter (Signed)
Please review for refill on the clonidine dosing. Thanks!

## 2017-12-17 NOTE — Patient Instructions (Signed)
Continue weighing daily and call for an overnight weight gain of > 2 pounds or a weekly weight gain of >5 pounds.  Follow up with [facility] regarding sleep study.  Follow up with primary care about metformin and glucometer.

## 2017-12-18 ENCOUNTER — Encounter: Payer: Self-pay | Admitting: Family

## 2017-12-25 ENCOUNTER — Emergency Department
Admission: EM | Admit: 2017-12-25 | Discharge: 2017-12-26 | Disposition: A | Discharge status: Home / Self Care | Payer: Managed Care, Other (non HMO) | Attending: Emergency Medicine | Admitting: Emergency Medicine

## 2017-12-25 ENCOUNTER — Encounter: Payer: Self-pay | Admitting: Emergency Medicine

## 2017-12-25 ENCOUNTER — Emergency Department: Payer: Managed Care, Other (non HMO)

## 2017-12-25 DIAGNOSIS — I110 Hypertensive heart disease with heart failure: Secondary | ICD-10-CM | POA: Diagnosis not present

## 2017-12-25 DIAGNOSIS — I5032 Chronic diastolic (congestive) heart failure: Secondary | ICD-10-CM | POA: Insufficient documentation

## 2017-12-25 DIAGNOSIS — I10 Essential (primary) hypertension: Secondary | ICD-10-CM

## 2017-12-25 DIAGNOSIS — Z79899 Other long term (current) drug therapy: Secondary | ICD-10-CM | POA: Diagnosis not present

## 2017-12-25 DIAGNOSIS — Z7984 Long term (current) use of oral hypoglycemic drugs: Secondary | ICD-10-CM | POA: Insufficient documentation

## 2017-12-25 DIAGNOSIS — R0602 Shortness of breath: Secondary | ICD-10-CM | POA: Diagnosis present

## 2017-12-25 DIAGNOSIS — E119 Type 2 diabetes mellitus without complications: Secondary | ICD-10-CM | POA: Diagnosis not present

## 2017-12-25 DIAGNOSIS — Z7982 Long term (current) use of aspirin: Secondary | ICD-10-CM | POA: Insufficient documentation

## 2017-12-25 DIAGNOSIS — E876 Hypokalemia: Secondary | ICD-10-CM | POA: Insufficient documentation

## 2017-12-25 LAB — BASIC METABOLIC PANEL
ANION GAP: 10 (ref 5–15)
Anion gap: 9 (ref 5–15)
BUN: 13 mg/dL (ref 6–20)
BUN: 15 mg/dL (ref 6–20)
CHLORIDE: 104 mmol/L (ref 101–111)
CO2: 27 mmol/L (ref 22–32)
CO2: 26 mmol/L (ref 22–32)
Calcium: 8.9 mg/dL (ref 8.9–10.3)
Calcium: 8.5 mg/dL — ABNORMAL LOW (ref 8.9–10.3)
Chloride: 103 mmol/L (ref 101–111)
Creatinine, Ser: 0.96 mg/dL (ref 0.44–1.00)
Creatinine, Ser: 0.92 mg/dL (ref 0.44–1.00)
GFR calc Af Amer: >60 mL/min (ref >60)
GFR calc non Af Amer: >60 mL/min (ref >60)
Glucose, Bld: 162 mg/dL — ABNORMAL HIGH (ref 65–99)
Glucose, Bld: 118 mg/dL — ABNORMAL HIGH (ref 65–99)
POTASSIUM: 3.4 mmol/L — AB (ref 3.5–5.1)
Potassium: 2.7 mmol/L — CL (ref 3.5–5.1)
SODIUM: 140 mmol/L (ref 135–145)
SODIUM: 139 mmol/L (ref 135–145)

## 2017-12-25 LAB — CBC
HEMATOCRIT: 36 % (ref 35.0–47.0)
Hemoglobin: 12.3 g/dL (ref 12.0–16.0)
MCH: 28.6 pg (ref 26.0–34.0)
MCHC: 34 g/dL (ref 32.0–36.0)
MCV: 84.2 fL (ref 80.0–100.0)
Platelets: 372 10*3/uL (ref 150–440)
RBC: 4.28 MIL/uL (ref 3.80–5.20)
RDW: 15.5 % — ABNORMAL HIGH (ref 11.5–14.5)
WBC: 9 10*3/uL (ref 3.6–11.0)

## 2017-12-25 LAB — TROPONIN I: Troponin I: <0.03 ng/mL (ref <0.03)

## 2017-12-25 LAB — BRAIN NATRIURETIC PEPTIDE: B Natriuretic Peptide: 28 pg/mL (ref 0.0–100.0)

## 2017-12-25 MED ORDER — FUROSEMIDE 10 MG/ML IJ SOLN
40.0000 mg | Freq: Once | INTRAMUSCULAR | Status: AC
  Administered 2017-12-25: 40 mg via INTRAVENOUS
  Filled 2017-12-25: qty 4

## 2017-12-25 MED ORDER — CARVEDILOL 25 MG PO TABS
25.0000 mg | ORAL_TABLET | Freq: Two times a day (BID) | ORAL | Status: DC
  Administered 2017-12-25: 25 mg via ORAL

## 2017-12-25 MED ORDER — POTASSIUM CHLORIDE CRYS ER 20 MEQ PO TBCR
40.0000 meq | EXTENDED_RELEASE_TABLET | Freq: Once | ORAL | Status: AC
  Administered 2017-12-25: 40 meq via ORAL
  Filled 2017-12-25: qty 2

## 2017-12-25 MED ORDER — AMLODIPINE BESYLATE 5 MG PO TABS
10.0000 mg | ORAL_TABLET | Freq: Once | ORAL | Status: AC
  Administered 2017-12-25: 10 mg via ORAL
  Filled 2017-12-25: qty 2

## 2017-12-25 MED ORDER — POTASSIUM CHLORIDE 10 MEQ/100ML IV SOLN
10.0000 meq | INTRAVENOUS | Status: AC
  Administered 2017-12-25 (×3): 10 meq via INTRAVENOUS
  Filled 2017-12-25 (×5): qty 100

## 2017-12-25 MED ORDER — CARVEDILOL 25 MG PO TABS
ORAL_TABLET | ORAL | Status: DC
Start: 2017-12-25 — End: 2017-12-26
  Filled 2017-12-25: qty 1

## 2017-12-25 NOTE — Discharge Instructions (Addendum)
Please make an appointment to follow-up with Dr. Mariah Milling for your symptoms.  He may need to manage your blood pressure medications and consider getting a repeat echocardiogram to evaluate your heart.  Follow-up with your primary care physician or with Dr. Mariah Milling to have your potassium rechecked.  Return to the emergency department if you develop severe pain, fever, shortness of breath, chest pain or any other symptoms concerning to you.

## 2017-12-25 NOTE — ED Triage Notes (Signed)
Patient presents to the ED with shortness of breath that gradually worsened today until she was having a very difficult time breathing this afternoon.  Patient went to see the nurse at work who was concerned because patient's hands were cramping up and her hands were red.  Patient was placed on oxygen.  Patient states now that she is on oxygen she is feeling better.  States her oxygen level was never low.  Patient denies any chest pain at this time. Patient is in no obvious distress.

## 2017-12-25 NOTE — ED Provider Notes (Addendum)
[facility] Emergency Department Provider Note  ____________________________________________  Time seen: Approximately 6:32 PM  I have reviewed the triage vital signs and the nursing notes.   HISTORY  Chief Complaint Shortness of Breath    HPI Heather Norman is a 42 y.o. female with a history of HTN, HL, DM, CHF, and chronic shortness of breath presenting shortness of breath.  The patient reports "I get short of breath all the time and they cannot figure out why."  She does report that she had 2 acute exacerbations of CHF in 2015 in 2017, one of which was postpartum, and she follows in the CHF clinic and was last seen 2 weeks ago without any acute changes.  She has not had an echocardiogram in several years.  The patient is presenting today because she was at work and with exertion she had 2 separate episodes of shortness of breath.  She describes that she was pushing some heavy materials and developed shortness of breath to the point that she had to sit down.  It resolved when she sat down.  She did not have any associated chest pain, lightheadedness or syncope, diaphoresis, nausea or vomiting.  She did have a heart racing sensation.  She has not been experiencing any cough or cold symptoms, fever or chills, lower extremity swelling or calf pain.  Arrival to the emergency department, the patient is hypertensive to 201/110 on my examination and states she has not taken her evening medications today.  Past Medical History:  Diagnosis Date  . Arrhythmia   . Arthritis    kness/hands - no meds  . CHF (congestive heart failure) (HCC)   . Diabetes mellitus without complication (HCC)   . Headache(784.0)    otc med prn  . Heart murmur   . Hyperlipidemia    no meds since pregnancy  . Hypertension   . Obesity   . Shortness of breath    with exercise    Patient Active Problem List   Diagnosis Date Noted  . DM (diabetes mellitus) (HCC) 05/11/2017  . Snoring  05/11/2017  . Morbid obesity (HCC) 08/30/2015  . Chronic diastolic CHF (congestive heart failure) (HCC) 08/30/2015  . HTN (hypertension) 02/16/2015  . Elderly multigravida with antepartum condition or complication 05/22/2011    Past Surgical History:  Procedure Laterality Date  . CERVICAL CERCLAGE  06/01/2011   Procedure: CERCLAGE CERVICAL;  Surgeon: Philip Aspen, DO;  Location: [facility];  Service: Gynecology;  Laterality: N/A;  REQUESTING PORTABLE ULTARSOUND AT BEDSIDE PT'S EDC:12/02/2011  . CERVICAL CERCLAGE  06/01/2011   Procedure: CERCLAGE CERVICAL;  Surgeon: Philip Aspen, DO;  Location: [facility];  Service: Gynecology;  Laterality: N/A;  REQUESTING PORTABLE ULTARSOUND AT BEDSIDE PT'S EDC:12/02/2011  . endosocpy    . svd     x 2  . tab     x 1  . TUBAL LIGATION      Current Outpatient Rx  . Order #: [id number] Class: Historical Med  . Order #: [id number] Class: Normal  . Order #: [id number] Class: OTC  . Order #: [id number] Class: Historical Med  . Order #: [id number] Class: Historical Med  . Order #: [id number] Class: Historical Med  . Order #: [id number] Class: Normal  . Order #: [id number] Class: Historical Med  . Order #: [id number] Class: Historical Med  . Order #: [id number] Class: Historical Med  . Order #: [id number] Class: Historical Med  . Order #: [id number] Class: Historical Med  . Order #: [id number] Class: Historical Med  . Order #: [id number] Class: Historical Med  .  Order #: [id number] Class: Normal  . Order #: [id number] Class: Print  . Order #: [id number] Class: Normal    Allergies Penicillins  Family History  Problem Relation Age of Onset  . Stroke Mother   . Hypertension Mother   . Hyperlipidemia Mother   . Hypertension Father     Social History Social History   Tobacco Use  . Smoking status: Never Smoker  . Smokeless tobacco: Never Used  Substance Use Topics  . Alcohol use: No  . Drug use: No    Review of Systems Constitutional: No fever/chills.  No syncope.  No  diaphoresis. Eyes: No visual changes. ENT: No sore throat. No congestion or rhinorrhea. Cardiovascular: Denies chest pain.  Positive palpitations. Respiratory: Positive shortness of breath.  No cough. Gastrointestinal: No abdominal pain.  No nausea, no vomiting.  No diarrhea.  No constipation. Genitourinary: Negative for dysuria. Musculoskeletal: Negative for back pain.  No lower extreme swelling or calf pain. Skin: Negative for rash. Neurological: Negative for headaches. No focal numbness, tingling or weakness.     ____________________________________________   PHYSICAL EXAM:  VITAL SIGNS: ED Triage Vitals  Enc Vitals Group     BP 12/25/17 1514 (!) 192/106     Pulse Rate 12/25/17 1514 90     Resp 12/25/17 1514 18     Temp 12/25/17 1514 98.7 F (37.1 C)     Temp Source 12/25/17 1514 Oral     SpO2 12/25/17 1514 100 %     Weight 12/25/17 1515 281 lb (127.5 kg)     Height 12/25/17 1515 5\' 5"  (1.651 m)     Head Circumference --      Peak Flow --      Pain Score 12/25/17 1519 0     Pain Loc --      Pain Edu? --      Excl. in GC? --     Constitutional: Alert and oriented. Well appearing and in no acute distress. Answers questions appropriately. Eyes: Conjunctivae are normal.  EOMI. No scleral icterus. Head: Atraumatic. Nose: No congestion/rhinnorhea. Mouth/Throat: Mucous membranes are moist.  Neck: No stridor.  Supple.  No JVD.  No meningismus. Cardiovascular: Normal rate, regular rhythm. No murmurs, rubs or gallops.  Respiratory: Normal respiratory effort.  No accessory muscle use or retractions. Lungs CTAB.  No wheezes, rales or ronchi. Gastrointestinal: Obese.  Soft, nontender and nondistended.  No guarding or rebound.  No peritoneal signs. Musculoskeletal: No LE edema. No ttp in the calves or palpable cords.  Negative Homan's sign. Neurologic:  A&Ox3.  Speech is clear.  Face and smile are symmetric.  EOMI.  Moves all extremities well. Skin:  Skin is warm, dry and  intact. No rash noted. Psychiatric: Mood and affect are normal. Speech and behavior are normal.  Normal judgement  ____________________________________________   LABS (all labs ordered are listed, but only abnormal results are displayed)  Labs Reviewed  BASIC METABOLIC PANEL - Abnormal; Notable for the following components:      Result Value   Potassium 2.7 (*)    Glucose, Bld 162 (*)    All other components within normal limits  CBC - Abnormal; Notable for the following components:   RDW 15.5 (*)    All other components within normal limits  TROPONIN I  BRAIN NATRIURETIC PEPTIDE  TROPONIN I   ____________________________________________  EKG  ED ECG REPORT I, Rockne Menghini, the attending physician, personally viewed and interpreted this ECG.   Date: 12/25/2017  EKG Time: 1520  Rate:  78  Rhythm: normal sinus rhythm  Axis: leftward  Intervals:none  ST&T Change: No STEMI  ____________________________________________  RADIOLOGY  Dg Chest 2 View  Result Date: 12/25/2017 CLINICAL DATA:  42 year old female with progressive shortness of breath today. EXAM: CHEST - 2 VIEW COMPARISON:  05/06/2017 and earlier. FINDINGS: Stable mild cardiomegaly. And tortuosity of the thoracic aorta Visualized tracheal air column is within normal limits. Stable lung volumes. Pulmonary vascular congestion without overt edema. No pneumothorax, pleural effusion or confluent pulmonary opacity. No acute osseous abnormality identified. Negative visible bowel gas pattern. IMPRESSION: Pulmonary vascular congestion without overt edema. Electronically Signed   By: Odessa Fleming M.D.   On: 12/25/2017 15:48    ____________________________________________   PROCEDURES  Procedure(s) performed: None  Procedures  Critical Care performed: No ____________________________________________   INITIAL IMPRESSION / ASSESSMENT AND PLAN / ED COURSE  Pertinent labs & imaging results that were available during  my care of the patient were reviewed by me and considered in my medical decision making (see chart for details).  42 y.o. female with a history of CHF presenting with 2 episodes of shortness of breath with exertion.  Overall, the patient is hypertensive but she is not hypoxic, tachycardic and she has no abnormal cardiopulmonary findings on my examination.  There are multiple possible etiologies for the patient's symptoms including her idiopathic shortness of breath.  The patient has not been having symptoms that would be consistent with pneumonia; and her chest x-ray does not show any acute infiltrate.  She does have some pulmonary vascular congestion without frank edema on her chest x-ray, and has no significant peripheral edema on my examination.  A BNP is pending and I will give the patient Lasix; we will also get an ambulatory pulse oximetry reading.  I do not suspect PE as the patient is not hypoxic, tachycardic, and has no evidence for DVT, no personal or family history of blood clots.  The patient EKG does not show any ischemic changes and her first troponin is negative.  A second troponin is pending.  Plan reevaluation for final disposition.  ----------------------------------------- 11:37 PM on 12/25/2017 -----------------------------------------  Patient is work-up in the emergency department has been reassuring.  She has received Lasix.  Her troponin is negative.  She did have a Po kalemia, but has been supplemented and the repeat potassium is reassuring.  At this time, plan to discharge the patient home.  She will follow-up with her cardiologist and primary care physician.  She understands return precautions.  ----------------------------------------- 12:11 AM on 12/26/2017 -----------------------------------------  The patient's repeat potassium is 3.4 and she will be discharged at this time.  ____________________________________________  FINAL CLINICAL IMPRESSION(S) / ED  DIAGNOSES  Final diagnoses:  Hypertension, unspecified type  Hypokalemia  Shortness of breath         NEW MEDICATIONS STARTED DURING THIS VISIT:  New Prescriptions   No medications on file      Rockne Menghini, MD 12/25/17 2357    Rockne Menghini, MD 12/26/17 [phone]

## 2017-12-25 NOTE — ED Notes (Signed)
Pt ambulatory approximately 400 feet on room air; pt saturation maintains 95-97% with ambulation.  Pt denies any shortness of breath, chest pain, or dizziness.

## 2017-12-25 NOTE — ED Notes (Signed)
Pharmacy notified to send IV Potassium.

## 2017-12-26 ENCOUNTER — Encounter: Payer: Self-pay | Admitting: Nurse Practitioner

## 2017-12-26 ENCOUNTER — Ambulatory Visit (INDEPENDENT_AMBULATORY_CARE_PROVIDER_SITE_OTHER): Payer: Managed Care, Other (non HMO) | Admitting: Nurse Practitioner

## 2017-12-26 VITALS — BP 126/84 | HR 63 | Ht 65.0 in | Wt 278.0 lb

## 2017-12-26 DIAGNOSIS — I10 Essential (primary) hypertension: Secondary | ICD-10-CM | POA: Diagnosis not present

## 2017-12-26 DIAGNOSIS — E876 Hypokalemia: Secondary | ICD-10-CM | POA: Diagnosis not present

## 2017-12-26 DIAGNOSIS — I5032 Chronic diastolic (congestive) heart failure: Secondary | ICD-10-CM | POA: Diagnosis not present

## 2017-12-26 MED ORDER — POTASSIUM CHLORIDE CRYS ER 20 MEQ PO TBCR: EXTENDED_RELEASE_TABLET | ORAL | 3 refills | Status: DC

## 2017-12-26 MED ORDER — FUROSEMIDE 40 MG PO TABS: ORAL_TABLET | ORAL | 3 refills | Status: DC

## 2017-12-26 NOTE — Patient Instructions (Signed)
Medication Instructions: - Your physician has recommended you make the following change in your medication:   1) INCREASE potassium 20 meq- take 2 tablets (40 meq) by mouth twice daily  2) you may take an extra 1/2 tablet (20 mg) of lasix (furosemide) daily for a weight gain of 3 lbs or more overnight  Labwork: - Your physician recommends that you have lab work in: 1 week- BMP  Procedures/Testing: - none ordered  Follow-Up: - Your physician recommends that you schedule a follow-up appointment in: 2 months with Dr. Mariah Milling.   Any Additional Special Instructions Will Be Listed Below (If Applicable). - please call the office if the top # on your blood pressure is consecutively > 130     If you need a refill on your cardiac medications before your next appointment, please call your pharmacy.

## 2017-12-26 NOTE — Progress Notes (Signed)
Office Visit    Patient Name: Heather Norman Date of Encounter: 12/26/2017  Primary Care Provider:  [facility] Primary Cardiologist:  Julien Nordmann, MD  Chief Complaint    42 year old female with a history of HFpEF, diabetes, hypertension, hyperlipidemia, and obesity, who presents for follow-up after recent ER evaluation in the setting of hypertensive urgency and dyspnea.  Past Medical History    Past Medical History:  Diagnosis Date  . (HFpEF) heart failure with preserved ejection fraction (HCC)    a. 04/2017 Echo: EF 55-60%, no rwma, Mild MR, mildly dil LA. Nl RV fxn.  . Arthritis    kness/hands - no meds  . Diabetes mellitus without complication (HCC)   . Dyspnea on exertion   . Headache(784.0)    otc med prn  . Heart murmur   . Hyperlipidemia    no meds since pregnancy  . Hypertension   . Morbid obesity (HCC)    Past Surgical History:  Procedure Laterality Date  . CERVICAL CERCLAGE  06/01/2011   Procedure: CERCLAGE CERVICAL;  Surgeon: Philip Aspen, DO;  Location: [facility];  Service: Gynecology;  Laterality: N/A;  REQUESTING PORTABLE ULTARSOUND AT BEDSIDE PT'S EDC:12/02/2011  . CERVICAL CERCLAGE  06/01/2011   Procedure: CERCLAGE CERVICAL;  Surgeon: Philip Aspen, DO;  Location: [facility];  Service: Gynecology;  Laterality: N/A;  REQUESTING PORTABLE ULTARSOUND AT BEDSIDE PT'S EDC:12/02/2011  . endosocpy    . svd     x 2  . tab     x 1  . TUBAL LIGATION      Allergies  Allergies  Allergen Reactions  . Penicillins Anaphylaxis    Has patient had a PCN reaction causing immediate rash, facial/tongue/throat swelling, SOB or lightheadedness with hypotension: Yes Has patient had a PCN reaction causing severe rash involving mucus membranes or skin necrosis: No Has patient had a PCN reaction that required hospitalization: No Has patient had a PCN reaction occurring within the last 10 years: No If all of the above answers are "NO", then may  proceed with Cephalosporin use.    History of Present Illness    42 year old female with the above complex past medical history including obesity, hypertension, diabetes, HFpEF, and hyperlipidemia.  She has been followed closely in heart failure clinic and was last seen here in May 2018.  She says she has been doing well but was evaluated in the emergency department yesterday after experiencing fairly abrupt onset of dyspnea while at work.  She was  seen by the occupational health nurse and was noted to be in some amount of distress.  Blood pressure was over 200 and EMS was called.  She was taken to the ED with pressure remained elevated.  She had mild vascular congestion on chest x-ray.  BNP was normal.  She was treated with a dose of IV Lasix and oxygen with subjective and objective improvement.  She was subsequently discharged earlier this morning.  Blood pressure is currently 126/84.  On presentation to the ER, potassium was 2.7.  This was supplemented intravenously and orally and was 3.4 late last night.  She had not been having any significant dyspnea on exertion, chest pain, PND, orthopnea, dizziness, syncope, edema, or early satiety prior to the events yesterday.  She says her weight may have gone up some late last week and over the weekend.  Weight will move in a range between 279 and 289.  She is 278 this morning.  Home Medications  Prior to Admission medications   Medication Sig Start Date End Date Taking? Authorizing Provider  albuterol (PROVENTIL HFA;VENTOLIN HFA) 108 (90 Base) MCG/ACT inhaler Inhale 2 puffs into the lungs every 4 (four) hours as needed for wheezing or shortness of breath. q4-6 h PRN for shortness of breath   Yes [provider]  amLODipine (NORVASC) 10 MG tablet TAKE 1 TABLET BY MOUTH  DAILY 12/07/17  Yes Gollan, Tollie Pizza, MD  aspirin 81 MG tablet Take 1 tablet (81 mg total) by mouth daily. 06/28/17  Yes Gollan, Tollie Pizza, MD  atorvastatin (LIPITOR) 20 MG  tablet Take 20 mg by mouth daily. 1 tab po QHS   Yes [provider]  carvedilol (COREG) 25 MG tablet Take 25 mg by mouth 2 (two) times daily with a meal.   Yes [provider]  fluticasone (FLONASE) 50 MCG/ACT nasal spray Place 1 spray into both nostrils daily as needed for allergies or rhinitis.   Yes [provider]  furosemide (LASIX) 40 MG tablet Take 1 tablet (40 mg) by mouth twice daily, may take an extra 1/2 tablet (20 mg) once daily as needed for weight gain of 3 lbs overnight 12/26/17  Yes Creig Hines, NP  hyoscyamine (LEVSIN SL) 0.125 MG SL tablet Place 0.125 mg under the tongue every 6 (six) hours as needed.   Yes [provider]  loratadine (CLARITIN) 10 MG tablet Take 10 mg by mouth daily. Takes daily   Yes [provider]  metFORMIN (GLUCOPHAGE) 500 MG tablet Take 500 mg by mouth 2 (two) times daily with a meal.   Yes [provider]  Multiple Vitamin (MULTIVITAMIN) LIQD Take 15 mLs by mouth daily. Nutra Burst liquid vitamin   Yes [provider]  oxybutynin (DITROPAN) 5 MG tablet Take 5 mg by mouth 2 (two) times daily. 12/25/17  Yes [provider]  polyethylene glycol-electrolytes (NULYTELY/GOLYTELY) 420 g solution Take 4,000 mLs by mouth once.   Yes [provider]  potassium chloride SA (KLOR-CON M20) 20 MEQ tablet Take 2 tablets (40 meq) by mouth twice daily 12/26/17  Yes Creig Hines, NP  traMADol (ULTRAM) 50 MG tablet Take 1 tablet (50 mg total) by mouth every 6 (six) hours as needed. 05/06/17 05/06/18 Yes Emily Filbert, MD  valsartan (DIOVAN) 160 MG tablet Take 1 tablet (160 mg total) by mouth daily. 11/19/17  Yes Delma Freeze, FNP    Review of Systems    Abrupt onset of dyspnea and elevated blood pressures as outlined above.  Currently stable.  She denies chest pain, palpitations, PND, orthopnea, dizziness, syncope, edema, or early satiety.  All other systems  reviewed and are otherwise negative except as noted above.  Physical Exam    VS:  BP 126/84 (BP Location: Left Arm, Patient Position: Sitting, Cuff Size: Large)   Pulse 63   Ht 5\' 5"  (1.651 m)   Wt 278 lb (126.1 kg)   BMI 46.26 kg/m  , BMI Body mass index is 46.26 kg/m. GEN: Well nourished, well developed, in no acute distress.  HEENT: normal.  Neck: Supple, no JVD, carotid bruits, or masses. Cardiac: RRR, 2/6 systolic murmur at the upper sternal border, no rubs, or gallops. No clubbing, cyanosis, edema.  Radials/DP/PT 2+ and equal bilaterally.  Respiratory:  Respirations regular and unlabored, clear to auscultation bilaterally. GI: Obese, soft, nontender, nondistended, BS + x 4. MS: no deformity or atrophy. Skin: warm and dry, no rash. Neuro:  Strength and sensation  are intact. Psych: Normal affect.  Accessory Clinical Findings    ECG -regular sinus rhythm, 63, left axis, LVH with repolarization abnormality, no acute changes.  Assessment & Plan    1.  HFpEF: Patient presented to the emergency department yesterday in the setting of dyspnea and hypertensive urgency.  Chest x-ray showed mild vascular congestion.  BNP was low though with obesity, it not likely reliable.  She was treated with Lasix with improvement.  Blood pressure stable today.  She says she did ate 2 hamburgers on Monday and then a hot dog yesterday prior to this occurring.  We discussed the importance of daily weights, sodium restriction, medication compliance, and symptom reporting and she verbalizes understanding.  I also discussed that I would like to see her weight in a much tighter range.  She currently lets at [place] between 279 and 289.  She is 278 today.  She will remain on furosemide 40 mg twice daily I have recommended that if her weight goes up 3 pounds overnight, she is to take an additional 20 mg of Lasix in the a.m.  2.  Essential hypertension: Hypertensive urgency yesterday.  This seemed to come on suddenly  as she is well-controlled today.  I suspect she may have had a salt load over the weekend as outlined above.  She does have a cuff at home and I recommended she follow her pressure regularly and contact us if systolics are greater than 130, at which point we could adjust her medications-potentially double valsartan.  3.  Hypokalemia: Potassium 2.7.  I have asked her to increase her potassium chloride to 40 mEq twice daily.  Follow-up basic metabolic panel in 1 week.  4.  Diabetes mellitus: Followed by primary care and on metformin.  5.  Morbid obesity: She would benefit from nutritional counseling.  She is not routinely exercising.  6.  Disposition: Follow-up in clinic in 2 months or sooner if necessary.   Nicolasa Ducking, NP 12/26/2017, 4:48 PM

## 2017-12-26 NOTE — ED Notes (Addendum)
Note entered in error

## 2018-02-23 NOTE — Progress Notes (Deleted)
Cardiology Office Note  Date:  02/23/2018   ID:  Heather Norman, DOB 1975/08/14, MRN [id number]  PCP:  [facility]   No chief complaint on file.   HPI:  Ms. Heather Norman a pleasant 42 year old woman with morbid obesity,  diabetes type 2,  hypertension,  chronic diastolic CHF,  who presents for routine office visit for her diastolic CHF and shortness of breath.  BP elevated>160 at home No BP cuff  She is working with a nutritionist Still drinking soda every day No recent lab work to evaluate potassium level  She is taking 2 potassium pills per day No regular exercise program Weight continues to be an issue  Previous labs reviewed personally by myself and with the patient on todays visit Lab work reviewed showing LDL 116, total cholesterol 186, hemoglobin A1c 6.4  EKG personally reviewed by myself on todays visit Shows normal sinus rhythm rate 70 bpm T-wave abnormality 1, aVL, V6,   Other past medical history reviewed  Previously had urinary incontinence issues   Previous trip to [place], was very short of breath walking around She attributes this to deconditioning  Prior echocardiogram 2015 showing normal LV function, mild MR    PMH:   has a past medical history of (HFpEF) heart failure with preserved ejection fraction (HCC), Arthritis, Diabetes mellitus without complication (HCC), Dyspnea on exertion, Headache(784.0), Heart murmur, Hyperlipidemia, Hypertension, and Morbid obesity (HCC).  PSH:    Past Surgical History:  Procedure Laterality Date  . CERVICAL CERCLAGE  06/01/2011   Procedure: CERCLAGE CERVICAL;  Surgeon: Heather Aspen, DO;  Location: [facility];  Service: Gynecology;  Laterality: N/A;  REQUESTING PORTABLE ULTARSOUND AT BEDSIDE PT'S EDC:12/02/2011  . CERVICAL CERCLAGE  06/01/2011   Procedure: CERCLAGE CERVICAL;  Surgeon: Heather Aspen, DO;  Location: [facility];  Service: Gynecology;  Laterality: N/A;  REQUESTING PORTABLE  ULTARSOUND AT BEDSIDE PT'S EDC:12/02/2011  . endosocpy    . svd     x 2  . tab     x 1  . TUBAL LIGATION      Current Outpatient Medications  Medication Sig Dispense Refill  . albuterol (PROVENTIL HFA;VENTOLIN HFA) 108 (90 Base) MCG/ACT inhaler Inhale 2 puffs into the lungs every 4 (four) hours as needed for wheezing or shortness of breath. q4-6 h PRN for shortness of breath    . amLODipine (NORVASC) 10 MG tablet TAKE 1 TABLET BY MOUTH  DAILY 30 tablet 0  . aspirin 81 MG tablet Take 1 tablet (81 mg total) by mouth daily. 90 tablet 3  . atorvastatin (LIPITOR) 20 MG tablet Take 20 mg by mouth daily. 1 tab po QHS    . carvedilol (COREG) 25 MG tablet Take 25 mg by mouth 2 (two) times daily with a meal.    . fluticasone (FLONASE) 50 MCG/ACT nasal spray Place 1 spray into both nostrils daily as needed for allergies or rhinitis.    . furosemide (LASIX) 40 MG tablet Take 1 tablet (40 mg) by mouth twice daily, may take an extra 1/2 tablet (20 mg) once daily as needed for weight gain of 3 lbs overnight 225 tablet 3  . hyoscyamine (LEVSIN SL) 0.125 MG SL tablet Place 0.125 mg under the tongue every 6 (six) hours as needed.    . loratadine (CLARITIN) 10 MG tablet Take 10 mg by mouth daily. Takes daily    . metFORMIN (GLUCOPHAGE) 500 MG tablet Take 500 mg by mouth 2 (two) times daily with a meal.    .  Multiple Vitamin (MULTIVITAMIN) LIQD Take 15 mLs by mouth daily. Nutra Burst liquid vitamin    . oxybutynin (DITROPAN) 5 MG tablet Take 5 mg by mouth 2 (two) times daily.    . polyethylene glycol-electrolytes (NULYTELY/GOLYTELY) 420 g solution Take 4,000 mLs by mouth once.    . potassium chloride SA (KLOR-CON M20) 20 MEQ tablet Take 2 tablets (40 meq) by mouth twice daily 360 tablet 3  . traMADol (ULTRAM) 50 MG tablet Take 1 tablet (50 mg total) by mouth every 6 (six) hours as needed. 20 tablet 0  . valsartan (DIOVAN) 160 MG tablet Take 1 tablet (160 mg total) by mouth daily. 90 tablet 3   No current  facility-administered medications for this visit.      Allergies:   Penicillins   Social History:  The patient  reports that she has never smoked. She has never used smokeless tobacco. She reports that she does not drink alcohol or use drugs.   Family History:   family history includes Hyperlipidemia in her mother; Hypertension in her father and mother; Stroke in her mother.    Review of Systems: Review of Systems  Constitutional: Negative.   Respiratory: Negative.   Cardiovascular: Negative.   Gastrointestinal: Negative.   Musculoskeletal: Negative.   Neurological: Negative.   Psychiatric/Behavioral: Negative.   All other systems reviewed and are negative.    PHYSICAL EXAM: VS:  There were no vitals taken for this visit. , BMI There is no height or weight on file to calculate BMI. GEN: Well nourished, well developed, in no acute distress , Obese  HEENT: normal  Neck: no JVD, carotid bruits, or masses Cardiac: RRR; no murmurs, rubs, or gallops,no edema  Respiratory:  clear to auscultation bilaterally, normal work of breathing GI: soft, nontender, nondistended, + BS MS: no deformity or atrophy  Skin: warm and dry, no rash Neuro:  Strength and sensation are intact Psych: euthymic mood, full affect    Recent Labs: 12/25/2017: B Natriuretic Peptide 28.0; BUN 15; Creatinine, Ser 0.92; Hemoglobin 12.3; Platelets 372; Potassium 3.4; Sodium 139    Lipid Panel Lab Results  Component Value Date   CHOL 168 01/14/2015   HDL 42 01/14/2015   LDLCALC 106 (H) 01/14/2015   TRIG 102 01/14/2015      Wt Readings from Last 3 Encounters:  12/26/17 278 lb (126.1 kg)  12/25/17 281 lb (127.5 kg)  12/17/17 281 lb 2 oz (127.5 kg)       ASSESSMENT AND PLAN:   Essential hypertension - Plan: EKG 12-Lead Unable to tolerate isosorbide Recommended we add clonidine 0.1 mg twice a day We will increase amlodipine up to 10 mg daily  Dyspnea, unspecified type - Plan: EKG  12-Lead shortness of breath secondary to obesity, deconditioning Recommended regular exercise program Weight loss She is working with a nutritionist  Chronic diastolic CHF (congestive heart failure) (HCC) - Plan: EKG 12-Lead She is taking 2 Lasix and potassium per day BMP ordered for today  Morbid obesity We have encouraged continued exercise, careful diet management in an effort to lose weight.  Hypokalemia Order placed for BMP   Total encounter time more than 25 minutes  Greater than 50% was spent in counseling and coordination of care with the patient  Disposition:   F/U  6 months   No orders of the defined types were placed in this encounter.    Signed, Dossie Arbour, M.D., Ph.D. 02/23/2018  [facility] [place], [place] [phone]

## 2018-02-25 ENCOUNTER — Other Ambulatory Visit: Payer: Self-pay

## 2018-02-25 ENCOUNTER — Telehealth: Payer: Self-pay | Admitting: Cardiovascular Disease

## 2018-02-25 ENCOUNTER — Ambulatory Visit: Payer: Managed Care, Other (non HMO) | Admitting: Cardiovascular Disease

## 2018-02-25 DIAGNOSIS — I5032 Chronic diastolic (congestive) heart failure: Secondary | ICD-10-CM

## 2018-02-25 DIAGNOSIS — I10 Essential (primary) hypertension: Secondary | ICD-10-CM

## 2018-02-25 NOTE — Telephone Encounter (Signed)
BMET order placed for today.

## 2018-02-25 NOTE — Telephone Encounter (Signed)
Pt needs lab order changed . Expiration date is 01/02/2018. [place] [facility].

## 2018-02-26 ENCOUNTER — Other Ambulatory Visit
Admission: RE | Admit: 2018-02-26 | Discharge: 2018-02-26 | Disposition: A | Discharge status: Home / Self Care | Payer: Managed Care, Other (non HMO) | Source: Ambulatory Visit | Attending: Nurse Practitioner | Admitting: Nurse Practitioner

## 2018-02-26 DIAGNOSIS — I5032 Chronic diastolic (congestive) heart failure: Secondary | ICD-10-CM | POA: Diagnosis present

## 2018-02-26 DIAGNOSIS — I10 Essential (primary) hypertension: Secondary | ICD-10-CM

## 2018-02-26 LAB — BASIC METABOLIC PANEL
ANION GAP: 8 (ref 5–15)
BUN: 13 mg/dL (ref 6–20)
CO2: 26 mmol/L (ref 22–32)
Calcium: 8.5 mg/dL — ABNORMAL LOW (ref 8.9–10.3)
Chloride: 106 mmol/L (ref 98–111)
Creatinine, Ser: 1.14 mg/dL — ABNORMAL HIGH (ref 0.44–1.00)
GFR calc Af Amer: >60 mL/min (ref >60)
GFR, EST NON AFRICAN AMERICAN: 58 mL/min — AB (ref >60)
Glucose, Bld: 128 mg/dL — ABNORMAL HIGH (ref 70–99)
POTASSIUM: 3.6 mmol/L (ref 3.5–5.1)
SODIUM: 140 mmol/L (ref 135–145)

## 2018-02-28 ENCOUNTER — Encounter (INDEPENDENT_AMBULATORY_CARE_PROVIDER_SITE_OTHER): Payer: Self-pay

## 2018-03-01 ENCOUNTER — Telehealth: Payer: Self-pay | Admitting: Cardiovascular Disease

## 2018-03-01 MED ORDER — POTASSIUM CHLORIDE CRYS ER 20 MEQ PO TBCR: EXTENDED_RELEASE_TABLET | ORAL | Status: DC

## 2018-03-01 NOTE — Telephone Encounter (Signed)
I spoke with the patient. She is aware of her results. She states she has been actually taking: 1) lasix 40 mg- 2 tablets (80 mg) BID since being seen in our office in May. 2) potassium 20 mg- 2 tablets (40 meq) BID daily  She states she was having swelling in her extremities and was told she could take extra lasix as needed, so she increased the dose on her own. She states she saw her PCP after seeing [place] and they agreed she could take extra as needed as directed by cardiology. She is still having some swelling to her left knee and hands.  I have advised her to: 1) decrease lasix back to 40 mg - take 1 tablet BID with an extra 1/2 tablet (20 mg) once daily as originally prescribed by our office. 2) decrease potassium back to 20 meq- take 1 tablet (20 meq) BID  She is aware that the swelling around her left knee and hands is most likely not cardiac in nature and she should follow up with her PCP if this continues to be an issue for her.  She states she has been urinating frequently and having a BM each time as well. She will have a BMP in 2 weeks (~03/18/18)- she is due for a wellness exam at work that day and states they draw labs there.  She will verify they will draw a BMP and have these results sent to [place].  She voices understanding of all of the above and to call us back should her work not draw the BMP on 03/18/18. Will forward to [place], [place] as an [place].   I did confirm the patient's contact #'s with her.  She states all the contact #'s in her chart are correct. Her cell phone was messing up the last few days. She states if we need to call her at work, to ask for [employer] in [employer]."

## 2018-03-01 NOTE — Telephone Encounter (Signed)
Please see documentation on result note.

## 2018-03-17 ENCOUNTER — Emergency Department: Payer: Managed Care, Other (non HMO)

## 2018-03-17 ENCOUNTER — Encounter: Payer: Self-pay | Admitting: Emergency Medicine

## 2018-03-17 ENCOUNTER — Other Ambulatory Visit: Payer: Self-pay

## 2018-03-17 ENCOUNTER — Emergency Department
Admission: EM | Admit: 2018-03-17 | Discharge: 2018-03-17 | Disposition: A | Discharge status: Home / Self Care | Payer: Managed Care, Other (non HMO) | Attending: Emergency Medicine | Admitting: Emergency Medicine

## 2018-03-17 DIAGNOSIS — E119 Type 2 diabetes mellitus without complications: Secondary | ICD-10-CM | POA: Insufficient documentation

## 2018-03-17 DIAGNOSIS — R2242 Localized swelling, mass and lump, left lower limb: Secondary | ICD-10-CM | POA: Diagnosis not present

## 2018-03-17 DIAGNOSIS — R05 Cough: Secondary | ICD-10-CM | POA: Diagnosis not present

## 2018-03-17 DIAGNOSIS — I509 Heart failure, unspecified: Secondary | ICD-10-CM

## 2018-03-17 DIAGNOSIS — I110 Hypertensive heart disease with heart failure: Secondary | ICD-10-CM | POA: Insufficient documentation

## 2018-03-17 DIAGNOSIS — Z79899 Other long term (current) drug therapy: Secondary | ICD-10-CM | POA: Insufficient documentation

## 2018-03-17 DIAGNOSIS — Z7982 Long term (current) use of aspirin: Secondary | ICD-10-CM | POA: Insufficient documentation

## 2018-03-17 DIAGNOSIS — M79605 Pain in left leg: Secondary | ICD-10-CM | POA: Insufficient documentation

## 2018-03-17 DIAGNOSIS — I5032 Chronic diastolic (congestive) heart failure: Secondary | ICD-10-CM | POA: Insufficient documentation

## 2018-03-17 DIAGNOSIS — M7989 Other specified soft tissue disorders: Secondary | ICD-10-CM

## 2018-03-17 LAB — BASIC METABOLIC PANEL
ANION GAP: 6 (ref 5–15)
BUN: 12 mg/dL (ref 6–20)
CHLORIDE: 106 mmol/L (ref 98–111)
CO2: 28 mmol/L (ref 22–32)
Calcium: 8.4 mg/dL — ABNORMAL LOW (ref 8.9–10.3)
Creatinine, Ser: 0.89 mg/dL (ref 0.44–1.00)
GFR calc Af Amer: >60 mL/min (ref >60)
Glucose, Bld: 121 mg/dL — ABNORMAL HIGH (ref 70–99)
Potassium: 3.3 mmol/L — ABNORMAL LOW (ref 3.5–5.1)
SODIUM: 140 mmol/L (ref 135–145)

## 2018-03-17 LAB — TROPONIN I: Troponin I: <0.03 ng/mL (ref <0.03)

## 2018-03-17 LAB — CBC
HEMATOCRIT: 35.5 % (ref 35.0–47.0)
HEMOGLOBIN: 11.9 g/dL — AB (ref 12.0–16.0)
MCH: 27.6 pg (ref 26.0–34.0)
MCHC: 33.6 g/dL (ref 32.0–36.0)
MCV: 82 fL (ref 80.0–100.0)
Platelets: 362 10*3/uL (ref 150–440)
RBC: 4.33 MIL/uL (ref 3.80–5.20)
RDW: 16.9 % — ABNORMAL HIGH (ref 11.5–14.5)
WBC: 10 10*3/uL (ref 3.6–11.0)

## 2018-03-17 LAB — POC URINE PREG, ED: PREG TEST UR: NEGATIVE

## 2018-03-17 LAB — FIBRIN DERIVATIVES D-DIMER (ARMC ONLY): FIBRIN DERIVATIVES D-DIMER (ARMC): 342.46 ng{FEU}/mL (ref 0.00–499.00)

## 2018-03-17 MED ORDER — FUROSEMIDE 10 MG/ML IJ SOLN
60.0000 mg | Freq: Once | INTRAMUSCULAR | Status: AC
  Administered 2018-03-17: 60 mg via INTRAVENOUS
  Filled 2018-03-17: qty 8

## 2018-03-17 MED ORDER — POTASSIUM CHLORIDE CRYS ER 20 MEQ PO TBCR
40.0000 meq | EXTENDED_RELEASE_TABLET | Freq: Once | ORAL | Status: AC
  Administered 2018-03-17: 40 meq via ORAL
  Filled 2018-03-17: qty 2

## 2018-03-17 NOTE — ED Provider Notes (Signed)
[facility] Emergency Department Provider Note   ____________________________________________   I have reviewed the triage vital signs and the nursing notes.   HISTORY  Chief Complaint Leg Swelling   History limited by: Not Limited   HPI Heather Norman is a 42 y.o. female who presents to the emergency department today because of concern for left leg swelling and discomfort. Symptoms started roughly 2 weeks ago. Patient states that her swelling got worse after they took her down on her lasix dose. The patient came in today after noticing some swelling in her torso. In addition the patient has had some cough for roughly 2 weeks. Denies any chest pain or fevers.    Per medical record review patient has a history of CHF, DM.  Past Medical History:  Diagnosis Date  . (HFpEF) heart failure with preserved ejection fraction (HCC)    a. 04/2017 Echo: EF 55-60%, no rwma, Mild MR, mildly dil LA. Nl RV fxn.  . Arthritis    kness/hands - no meds  . Diabetes mellitus without complication (HCC)   . Dyspnea on exertion   . Headache(784.0)    otc med prn  . Heart murmur   . Hyperlipidemia    no meds since pregnancy  . Hypertension   . Morbid obesity [facility])     Patient Active Problem List   Diagnosis Date Noted  . DM (diabetes mellitus) (HCC) 05/11/2017  . Snoring 05/11/2017  . Morbid obesity (HCC) 08/30/2015  . Chronic diastolic CHF (congestive heart failure) (HCC) 08/30/2015  . HTN (hypertension) 02/16/2015  . Elderly multigravida with antepartum condition or complication 05/22/2011    Past Surgical History:  Procedure Laterality Date  . CERVICAL CERCLAGE  06/01/2011   Procedure: CERCLAGE CERVICAL;  Surgeon: Philip Aspen, DO;  Location: [facility];  Service: Gynecology;  Laterality: N/A;  REQUESTING PORTABLE ULTARSOUND AT BEDSIDE PT'S EDC:12/02/2011  . CERVICAL CERCLAGE  06/01/2011   Procedure: CERCLAGE CERVICAL;  Surgeon: Philip Aspen, DO;  Location:  [facility];  Service: Gynecology;  Laterality: N/A;  REQUESTING PORTABLE ULTARSOUND AT BEDSIDE PT'S EDC:12/02/2011  . endosocpy    . svd     x 2  . tab     x 1  . TUBAL LIGATION      Prior to Admission medications   Medication Sig Start Date End Date Taking? Authorizing Provider  albuterol (PROVENTIL HFA;VENTOLIN HFA) 108 (90 Base) MCG/ACT inhaler Inhale 2 puffs into the lungs every 4 (four) hours as needed for wheezing or shortness of breath. q4-6 h PRN for shortness of breath    [provider]  amLODipine (NORVASC) 10 MG tablet TAKE 1 TABLET BY MOUTH  DAILY 12/07/17   [name], MD  aspirin 81 MG tablet Take 1 tablet (81 mg total) by mouth daily. 06/28/17   [name], MD  atorvastatin (LIPITOR) 20 MG tablet Take 20 mg by mouth daily. 1 tab po QHS    [provider]  carvedilol (COREG) 25 MG tablet Take 25 mg by mouth 2 (two) times daily with a meal.    [provider]  fluticasone (FLONASE) 50 MCG/ACT nasal spray Place 1 spray into both nostrils daily as needed for allergies or rhinitis.    [provider]  furosemide (LASIX) 40 MG tablet Take 1 tablet (40 mg) by mouth twice daily, may take an extra 1/2 tablet (20 mg) once daily as needed for weight gain of 3 lbs overnight 12/26/17   Creig Hines, NP  hyoscyamine (LEVSIN SL) 0.125 MG SL tablet Place 0.125 mg under the tongue every 6 (six) hours as needed.    [provider]  loratadine (CLARITIN) 10 MG tablet Take 10 mg by mouth daily. Takes daily    [provider]  metFORMIN (GLUCOPHAGE) 500 MG tablet Take 500 mg by mouth 2 (two) times daily with a meal.    [provider]  Multiple Vitamin (MULTIVITAMIN) LIQD Take 15 mLs by mouth daily. Nutra Burst liquid vitamin    [provider]  oxybutynin (DITROPAN) 5 MG tablet Take 5 mg by mouth 2 (two) times daily. 12/25/17   [provider]  polyethylene glycol-electrolytes (NULYTELY/GOLYTELY)  420 g solution Take 4,000 mLs by mouth once.    [provider]  potassium chloride SA (KLOR-CON M20) 20 MEQ tablet Take 1 tablet (20 meq) by mouth twice daily 03/01/18   Sondra Barges, PA-C  traMADol (ULTRAM) 50 MG tablet Take 1 tablet (50 mg total) by mouth every 6 (six) hours as needed. 05/06/17 05/06/18  Emily Filbert, MD  valsartan (DIOVAN) 160 MG tablet Take 1 tablet (160 mg total) by mouth daily. 11/19/17   Delma Freeze, FNP    Allergies Penicillins  Family History  Problem Relation Age of Onset  . Stroke Mother   . Hypertension Mother   . Hyperlipidemia Mother   . Hypertension Father     Social History Social History   Tobacco Use  . Smoking status: Never Smoker  . Smokeless tobacco: Never Used  Substance Use Topics  . Alcohol use: No  . Drug use: No    Review of Systems Constitutional: No fever/chills Eyes: No visual changes. ENT: No sore throat. Cardiovascular: Denies chest pain. Respiratory: Denies shortness of breath. Positive for cough. Gastrointestinal: No abdominal pain.  No nausea, no vomiting.  No diarrhea.   Genitourinary: Negative for dysuria. Musculoskeletal: Positive for leg swelling. Skin: Negative for rash. Neurological: Negative for headaches, focal weakness or numbness.  ____________________________________________   PHYSICAL EXAM:  VITAL SIGNS: ED Triage Vitals  Enc Vitals Group     BP 03/17/18 1133 (!) 175/112     Pulse Rate 03/17/18 1133 90     Resp 03/17/18 1133 (!) 22     Temp 03/17/18 1133 98.2 F (36.8 C)     Temp Source 03/17/18 1133 Oral     SpO2 03/17/18 1133 98 %     Weight 03/17/18 1134 292 lb (132.5 kg)     Height 03/17/18 1134 5\' 4"  (1.626 m)     Head Circumference --      Peak Flow --      Pain Score 03/17/18 1133 5   Constitutional: Alert and oriented.  Eyes: Conjunctivae are normal.  ENT      Head: Normocephalic and atraumatic.      Nose: No congestion/rhinnorhea.      Mouth/Throat: Mucous  membranes are moist.      Neck: No stridor. Hematological/Lymphatic/Immunilogical: No cervical lymphadenopathy. Cardiovascular: Normal rate, regular rhythm.  No murmurs, rubs, or gallops.  Respiratory: Normal respiratory effort without tachypnea nor retractions. Breath sounds are clear and equal bilaterally. No wheezes/rales/rhonchi. Gastrointestinal: Soft and non tender. No rebound. No guarding.  Genitourinary: Deferred Musculoskeletal: Normal range of motion in all extremities. No lower extremity edema. Neurologic:  Normal speech and language. No gross focal neurologic deficits are appreciated.  Skin:  Skin is warm, dry and intact. No rash noted. Psychiatric: Mood and affect are normal. Speech and behavior are  normal. Patient exhibits appropriate insight and judgment.  ____________________________________________    LABS (pertinent positives/negatives)  BMP na 140, k 3.3, glu 121, cr 0.89 Trop <0.03 Upreg neg CBC wbc 10.0, hgb 11.9, plt 362  ____________________________________________   EKG  I, Phineas Semen, attending physician, personally viewed and interpreted this EKG  EKG Time: 1138 Rate: 80 Rhythm: normal sinus rhythm Axis: normal Intervals: qtc 419 QRS: narrow, q waves v1 ST changes: no st elevation Impression: abnormal ekg  ____________________________________________    RADIOLOGY  CXR Mild vascular congestion  ____________________________________________   PROCEDURES  Procedures  ____________________________________________   INITIAL IMPRESSION / ASSESSMENT AND PLAN / ED COURSE  Pertinent labs & imaging results that were available during my care of the patient were reviewed by me and considered in my medical decision making (see chart for details).   Patient presents to the emergency department today because of concerns for leg and torso swelling.  Patient has a history of CHF.  Did consider PE given that the left leg was more swollen than the  right however d-dimer less than 500.  Patient's x-ray shows some mild pulmonary congestion.  At this point I think CHF exacerbation likely.  Patient was given IV Lasix as well as potassium and did feel better.  Patient has follow-up already scheduled with heart failure clinic tomorrow.   ____________________________________________   FINAL CLINICAL IMPRESSION(S) / ED DIAGNOSES  Final diagnoses:  Leg swelling  Acute on chronic congestive heart failure, unspecified heart failure type [facility])     Note: This dictation was prepared with Dragon dictation. Any transcriptional errors that result from this process are unintentional     Phineas Semen, MD 03/17/18 1541

## 2018-03-17 NOTE — Discharge Instructions (Addendum)
Please seek medical attention for any high fevers, chest pain, shortness of breath, change in behavior, persistent vomiting, bloody stool or any other new or concerning symptoms.  

## 2018-03-17 NOTE — ED Notes (Signed)
ED Provider at bedside. 

## 2018-03-17 NOTE — ED Triage Notes (Signed)
Pt presents to ED via POV with c/o of bilateral  leg and hand swelling that has been going on for a couple of days. Pt states she has hx of CHF and recently weighed self and noticed a ten pound weight increase from baseline. Pt denies SOB and chest pain.

## 2018-03-18 ENCOUNTER — Telehealth: Payer: Self-pay | Admitting: Cardiovascular Disease

## 2018-03-18 ENCOUNTER — Ambulatory Visit: Payer: Managed Care, Other (non HMO) | Attending: Family | Admitting: Family

## 2018-03-18 ENCOUNTER — Encounter: Payer: Self-pay | Admitting: Family

## 2018-03-18 VITALS — BP 143/91 | HR 78 | Resp 18 | Ht 66.0 in | Wt 287.5 lb

## 2018-03-18 DIAGNOSIS — E119 Type 2 diabetes mellitus without complications: Secondary | ICD-10-CM

## 2018-03-18 DIAGNOSIS — Z7984 Long term (current) use of oral hypoglycemic drugs: Secondary | ICD-10-CM | POA: Diagnosis not present

## 2018-03-18 DIAGNOSIS — I110 Hypertensive heart disease with heart failure: Secondary | ICD-10-CM | POA: Diagnosis not present

## 2018-03-18 DIAGNOSIS — R05 Cough: Secondary | ICD-10-CM | POA: Insufficient documentation

## 2018-03-18 DIAGNOSIS — Z880 Allergy status to penicillin: Secondary | ICD-10-CM | POA: Insufficient documentation

## 2018-03-18 DIAGNOSIS — Z7982 Long term (current) use of aspirin: Secondary | ICD-10-CM | POA: Insufficient documentation

## 2018-03-18 DIAGNOSIS — Z823 Family history of stroke: Secondary | ICD-10-CM | POA: Insufficient documentation

## 2018-03-18 DIAGNOSIS — Z9889 Other specified postprocedural states: Secondary | ICD-10-CM | POA: Insufficient documentation

## 2018-03-18 DIAGNOSIS — Z79899 Other long term (current) drug therapy: Secondary | ICD-10-CM | POA: Insufficient documentation

## 2018-03-18 DIAGNOSIS — M1990 Unspecified osteoarthritis, unspecified site: Secondary | ICD-10-CM | POA: Insufficient documentation

## 2018-03-18 DIAGNOSIS — E785 Hyperlipidemia, unspecified: Secondary | ICD-10-CM | POA: Diagnosis not present

## 2018-03-18 DIAGNOSIS — Z8249 Family history of ischemic heart disease and other diseases of the circulatory system: Secondary | ICD-10-CM | POA: Insufficient documentation

## 2018-03-18 DIAGNOSIS — Z79891 Long term (current) use of opiate analgesic: Secondary | ICD-10-CM | POA: Diagnosis not present

## 2018-03-18 DIAGNOSIS — Z9851 Tubal ligation status: Secondary | ICD-10-CM | POA: Insufficient documentation

## 2018-03-18 DIAGNOSIS — I10 Essential (primary) hypertension: Secondary | ICD-10-CM

## 2018-03-18 DIAGNOSIS — I5032 Chronic diastolic (congestive) heart failure: Secondary | ICD-10-CM | POA: Insufficient documentation

## 2018-03-18 NOTE — Patient Instructions (Addendum)
Continue weighing daily and call for an overnight weight gain of > 2 pounds or a weekly weight gain of >5 pounds.  Take an extra potassium tablet today and tomorrow

## 2018-03-18 NOTE — Telephone Encounter (Signed)
Patient calling  States that she was going to need repeat BMP but she had it done at the hospital  Patient would like to discuss with nurse  Please call

## 2018-03-18 NOTE — Telephone Encounter (Signed)
S/w patient. She was in the ED yesterday and had lab work including BMP. She sees Clarisa Kindred, NP this morning. I encouraged patient to speak with her about further how to take her potassium and furosemide and she verbalized understanding. I will also make Alycia Rossetti aware that lab results are in Epic.

## 2018-03-18 NOTE — Progress Notes (Signed)
Patient ID: Heather Norman, female    DOB: 07-17-1976, 42 y.o.   MRN: [id number]  HPI  Heather Norman is a 42 y/o female with a history of arthritis, DM, hyperlipidemia, HTN, morbid obesity and chronic heart failure.  Echo report from 05/16/17 reviewed and showed an EF of 55-60% along with mild MR. Reviewed echo report from 05/15/14 which showed an EF of 60-65% along with mild MR and normal PA pressure.  Was in the ED 03/17/18 due to HF exacerbation. Given IV lasix and released. Was in the ED 12/25/17 due to shortness of breath. Given lasix and released.   She presents today for her follow-up visit with a chief complaint of minimal shortness of breath upon moderate exertion. She says that this has been present for a few years but had gotten worse over the last few months. Does seem a little bit better since she was recently in the ED. She has associated fatigue, dry cough and difficulty sleeping due to the cough. She denies any abdominal distention, palpitations, pedal edema, chest pain or dizziness. Hasn't been weighing as she doesn't have scales. In the process of moving so has been eating out more often (chik-fil-a). Currently drinking ~ 80 ounces of fluid daily. Has been on furosemide for a "long time". She did not take any of her home potassium last night and took 80mg  furosemide last night and another 80mg  furosemide this morning (supposed to be taking 40mg  BID),   Past Medical History:  Diagnosis Date  . (HFpEF) heart failure with preserved ejection fraction (HCC)    a. 04/2017 Echo: EF 55-60%, no rwma, Mild MR, mildly dil LA. Nl RV fxn.  . Arthritis    kness/hands - no meds  . Diabetes mellitus without complication (HCC)   . Dyspnea on exertion   . Headache(784.0)    otc med prn  . Heart murmur   . Hyperlipidemia    no meds since pregnancy  . Hypertension   . Morbid obesity (HCC)    Past Surgical History:  Procedure Laterality Date  . CERVICAL CERCLAGE  06/01/2011   Procedure:  CERCLAGE CERVICAL;  Surgeon: Philip Aspen, DO;  Location: [facility];  Service: Gynecology;  Laterality: N/A;  REQUESTING PORTABLE ULTARSOUND AT BEDSIDE PT'S EDC:12/02/2011  . CERVICAL CERCLAGE  06/01/2011   Procedure: CERCLAGE CERVICAL;  Surgeon: Philip Aspen, DO;  Location: [facility];  Service: Gynecology;  Laterality: N/A;  REQUESTING PORTABLE ULTARSOUND AT BEDSIDE PT'S EDC:12/02/2011  . endosocpy    . svd     x 2  . tab     x 1  . TUBAL LIGATION     Family History  Problem Relation Age of Onset  . Stroke Mother   . Hypertension Mother   . Hyperlipidemia Mother   . Hypertension Father    Social History   Tobacco Use  . Smoking status: Never Smoker  . Smokeless tobacco: Never Used  Substance Use Topics  . Alcohol use: No   Allergies  Allergen Reactions  . Penicillins Anaphylaxis    Has patient had a PCN reaction causing immediate rash, facial/tongue/throat swelling, SOB or lightheadedness with hypotension: Yes Has patient had a PCN reaction causing severe rash involving mucus membranes or skin necrosis: No Has patient had a PCN reaction that required hospitalization: No Has patient had a PCN reaction occurring within the last 10 years: No If all of the above answers are "NO", then may proceed with Cephalosporin use.   Past Medical History:  Diagnosis Date  . (HFpEF) heart failure with preserved ejection fraction (HCC)    a. 04/2017 Echo: EF 55-60%, no rwma, Mild MR, mildly dil LA. Nl RV fxn.  . Arthritis    kness/hands - no meds  . CHF (congestive heart failure) (HCC)   . Diabetes mellitus without complication (HCC)   . Dyspnea on exertion   . Headache(784.0)    otc med prn  . Heart murmur   . Hyperlipidemia    no meds since pregnancy  . Hypertension   . Morbid obesity (HCC)    Past Surgical History:  Procedure Laterality Date  . CERVICAL CERCLAGE  06/01/2011   Procedure: CERCLAGE CERVICAL;  Surgeon: Philip Aspen, DO;  Location: [facility];  Service: Gynecology;   Laterality: N/A;  REQUESTING PORTABLE ULTARSOUND AT BEDSIDE PT'S EDC:12/02/2011  . CERVICAL CERCLAGE  06/01/2011   Procedure: CERCLAGE CERVICAL;  Surgeon: Philip Aspen, DO;  Location: [facility];  Service: Gynecology;  Laterality: N/A;  REQUESTING PORTABLE ULTARSOUND AT BEDSIDE PT'S EDC:12/02/2011  . endosocpy    . svd     x 2  . tab     x 1  . TUBAL LIGATION     Family History  Problem Relation Age of Onset  . Stroke Mother   . Hypertension Mother   . Hyperlipidemia Mother   . Hypertension Father    Social History   Tobacco Use  . Smoking status: Never Smoker  . Smokeless tobacco: Never Used  Substance Use Topics  . Alcohol use: No   Allergies  Allergen Reactions  . Penicillins Anaphylaxis    Has patient had a PCN reaction causing immediate rash, facial/tongue/throat swelling, SOB or lightheadedness with hypotension: Yes Has patient had a PCN reaction causing severe rash involving mucus membranes or skin necrosis: No Has patient had a PCN reaction that required hospitalization: No Has patient had a PCN reaction occurring within the last 10 years: No If all of the above answers are "NO", then may proceed with Cephalosporin use.   Prior to Admission medications   Medication Sig Start Date End Date Taking? Authorizing Provider  albuterol (PROVENTIL HFA;VENTOLIN HFA) 108 (90 Base) MCG/ACT inhaler Inhale 2 puffs into the lungs every 4 (four) hours as needed for wheezing or shortness of breath. q4-6 h PRN for shortness of breath   Yes [provider]  amLODipine (NORVASC) 10 MG tablet TAKE 1 TABLET BY MOUTH  DAILY 12/07/17  Yes Gollan, Tollie Pizza, MD  aspirin 81 MG tablet Take 1 tablet (81 mg total) by mouth daily. 06/28/17  Yes Gollan, Tollie Pizza, MD  atorvastatin (LIPITOR) 20 MG tablet Take 20 mg by mouth daily. 1 tab po QHS   Yes [provider]  carvedilol (COREG) 25 MG tablet Take 25 mg by mouth 2 (two) times daily with a meal.   Yes [provider]   fluticasone (FLONASE) 50 MCG/ACT nasal spray Place 1 spray into both nostrils daily as needed for allergies or rhinitis.   Yes [provider]  furosemide (LASIX) 40 MG tablet Take 1 tablet (40 mg) by mouth twice daily, may take an extra 1/2 tablet (20 mg) once daily as needed for weight gain of 3 lbs overnight 12/26/17  Yes Creig Hines, NP  hyoscyamine (LEVSIN SL) 0.125 MG SL tablet Place 0.125 mg under the tongue every 6 (six) hours as needed.   Yes [provider]  loratadine (CLARITIN) 10 MG tablet Take 10 mg by mouth daily. Takes daily  Yes [provider]  metFORMIN (GLUCOPHAGE) 500 MG tablet Take 500 mg by mouth 2 (two) times daily with a meal.   Yes [provider]  Multiple Vitamin (MULTIVITAMIN) LIQD Take 15 mLs by mouth daily. Nutra Burst liquid vitamin   Yes [provider]  oxybutynin (DITROPAN) 5 MG tablet Take 5 mg by mouth 2 (two) times daily. 12/25/17  Yes [provider]  polyethylene glycol-electrolytes (NULYTELY/GOLYTELY) 420 g solution Take 4,000 mLs by mouth once.   Yes [provider]  potassium chloride SA (KLOR-CON M20) 20 MEQ tablet Take 1 tablet (20 meq) by mouth twice daily 03/01/18  Yes Dunn, Raymon Mutton, PA-C  traMADol (ULTRAM) 50 MG tablet Take 1 tablet (50 mg total) by mouth every 6 (six) hours as needed. 05/06/17 05/06/18 Yes Emily Filbert, MD  valsartan (DIOVAN) 160 MG tablet Take 1 tablet (160 mg total) by mouth daily. 11/19/17  Yes Delma Freeze, FNP    Review of Systems  Constitutional: Positive for fatigue. Negative for appetite change.  HENT: Negative for congestion, postnasal drip and sore throat.   Eyes: Negative.   Respiratory: Positive for cough (dry) and shortness of breath. Negative for chest tightness and wheezing.   Cardiovascular: Negative for chest pain, palpitations and leg swelling.  Gastrointestinal: Negative for abdominal distention, abdominal pain and diarrhea.   Endocrine: Negative.   Genitourinary: Negative.   Musculoskeletal: Positive for arthralgias (left knee). Negative for back pain and neck pain.  Skin: Negative.   Allergic/Immunologic: Negative.   Neurological: Negative for dizziness, light-headedness and headaches.  Hematological: Negative for adenopathy. Does not bruise/bleed easily.  Psychiatric/Behavioral: Positive for sleep disturbance (sleeping on 5 pillows). Negative for dysphoric mood. The patient is not nervous/anxious.    Vitals:   03/18/18 1214  BP: (!) 143/91  Pulse: 78  Resp: 18  SpO2: 98%  Weight: 287 lb 8 oz (130.4 kg)  Height: 5\' 6"  (1.676 m)   Wt Readings from Last 3 Encounters:  03/18/18 287 lb 8 oz (130.4 kg)  03/17/18 292 lb (132.5 kg)  12/26/17 278 lb (126.1 kg)   Lab Results  Component Value Date   CREATININE 0.89 03/17/2018   CREATININE 1.14 (H) 02/26/2018   CREATININE 0.92 12/25/2017    Physical Exam  Constitutional: She is oriented to person, place, and time. She appears well-developed and well-nourished.  HENT:  Head: Normocephalic and atraumatic.  Neck: Normal range of motion. Neck supple. No JVD present.  Cardiovascular: Normal rate and regular rhythm.  Pulmonary/Chest: Effort normal. She has no wheezes. She has no rales.  Abdominal: Soft. She exhibits no distension. There is no tenderness.  Musculoskeletal: She exhibits no edema or tenderness.  Neurological: She is alert and oriented to person, place, and time.  Skin: Skin is warm and dry.  Psychiatric: She has a normal mood and affect. Her behavior is normal. Thought content normal.  Nursing note and vitals reviewed.   Assessment & Plan:  1: Chronic heart failure with preserved ejection fraction- - NYHA class II - euvolemic today - not weighing daily as she doesn't have any scales. Set of scales given to her today and she was instructed to call for an overnight weight gain of >2 pounds or a weekly weight gain of >5 pounds - weight up 6  pounds since she was last here 3 months ago but down 5 pounds from ED visit yesterday - potassium level low yesterday so she was instructed to take an additional potassium both today  and tomorrow (which means she needs to take 3 tablets both today and tomorrow - not adding salt to her food but admits that she's been eating out more due to being in the process of moving. She's been eating at fast food places (ch[employer]-fil-a). Encouraged her to decrease how much food she's eating outside of the home - drinking ~ 80 ounces of fluid daily as she says that her mouth gets dry; instructed her to use sugar free candy and to decrease fluid consumption to closer to 60-64 ounces daily - consider changing diuretic to torsemide - saw cardiology [facility]) 12/26/17 and returns to cardiology 03/25/18 - BNP 12/25/17 was 28.0  2: HTN- - BP mildly elevated today - saw PCP [facility]) 12/20/17 and returns 04/15/18 - BMP from 03/17/18 reviewed and shows sodium 140, potassium 3.3 and GFR >60  3: Diabetes-  - glucose yesterday was 121 - taking metformin twice daily - last A1c was 6.6% in 2014 - she said that her PCP told her that she didn't need a glucometer  Patient did not bring her medications nor a list. Each medication was verbally reviewed with the patient and she was encouraged to bring the bottles to every visit to confirm accuracy of list.  Return in 1 month or sooner for any questions/problems before then.

## 2018-03-18 NOTE — Telephone Encounter (Signed)
Made in error

## 2018-03-18 NOTE — Telephone Encounter (Signed)
Patient scheduled to see Ward Givens, NP on Wednesday and 2:30pm. Patient agreeable to appointment date and time.  She is aware she may need repeat lab work in 1 week.

## 2018-03-18 NOTE — Telephone Encounter (Signed)
I see where she was give IV Lasix and KCl in the ED with a bmet that showed a SCr of 0.89 and K+ of 3.3. Upon seeing the [facility]F Clinic today her weight was up 6 pounds from her last visit there and up 9 pounds when compared to our last visit. CHF Clinic advised the patient to take an extra dose of KCl today on 8/20.   1) She needs follow bmet within 1 week given added KCl in the ED and at the CHF clinic  2) Based on weights, it would appear she is still volume up. Please assess her symptoms. We may need to diurese her some more.   3) She should be seen by our office later this week, if possible.

## 2018-03-20 ENCOUNTER — Ambulatory Visit (INDEPENDENT_AMBULATORY_CARE_PROVIDER_SITE_OTHER): Payer: Managed Care, Other (non HMO) | Admitting: Nurse Practitioner

## 2018-03-20 ENCOUNTER — Encounter: Payer: Self-pay | Admitting: Nurse Practitioner

## 2018-03-20 VITALS — BP 128/86 | Ht 65.5 in | Wt 286.8 lb

## 2018-03-20 DIAGNOSIS — E119 Type 2 diabetes mellitus without complications: Secondary | ICD-10-CM | POA: Diagnosis not present

## 2018-03-20 DIAGNOSIS — I5033 Acute on chronic diastolic (congestive) heart failure: Secondary | ICD-10-CM

## 2018-03-20 DIAGNOSIS — I10 Essential (primary) hypertension: Secondary | ICD-10-CM | POA: Diagnosis not present

## 2018-03-20 NOTE — Progress Notes (Addendum)
Office Visit    Patient Name: Heather Norman Date of Encounter: 03/20/2018  Primary Care Provider:  [facility] Primary Cardiologist:  Julien Nordmann, MD  Chief Complaint    42 year old female with a history of HFpEF, diabetes, hypertension, hyperlipidemia, and obesity, who presents for follow-up after recent ER visit.  Past Medical History    Past Medical History:  Diagnosis Date  . (HFpEF) heart failure with preserved ejection fraction (HCC)    a. 04/2017 Echo: EF 55-60%, no rwma, Mild MR, mildly dil LA. Nl RV fxn.  . Arthritis    kness/hands - no meds  . CHF (congestive heart failure) (HCC)   . Diabetes mellitus without complication (HCC)   . Dyspnea on exertion   . Headache(784.0)    otc med prn  . Heart murmur   . Hyperlipidemia    no meds since pregnancy  . Hypertension   . Morbid obesity (HCC)    Past Surgical History:  Procedure Laterality Date  . CERVICAL CERCLAGE  06/01/2011   Procedure: CERCLAGE CERVICAL;  Surgeon: Philip Aspen, DO;  Location: [facility];  Service: Gynecology;  Laterality: N/A;  REQUESTING PORTABLE ULTARSOUND AT BEDSIDE PT'S EDC:12/02/2011  . CERVICAL CERCLAGE  06/01/2011   Procedure: CERCLAGE CERVICAL;  Surgeon: Philip Aspen, DO;  Location: [facility];  Service: Gynecology;  Laterality: N/A;  REQUESTING PORTABLE ULTARSOUND AT BEDSIDE PT'S EDC:12/02/2011  . endosocpy    . svd     x 2  . tab     x 1  . TUBAL LIGATION      Allergies  Allergies  Allergen Reactions  . Penicillins Anaphylaxis    Has patient had a PCN reaction causing immediate rash, facial/tongue/throat swelling, SOB or lightheadedness with hypotension: Yes Has patient had a PCN reaction causing severe rash involving mucus membranes or skin necrosis: No Has patient had a PCN reaction that required hospitalization: No Has patient had a PCN reaction occurring within the last 10 years: No If all of the above answers are "NO", then may proceed with  Cephalosporin use.    History of Present Illness    42 year old female with the above complex past medical history including obesity, hypertension, diabetes, HFpEF, and hyperlipidemia.  I last saw her in clinic in May, 1 day after being seen in the ER for hypertensive urgency and volume overload.  This occurred in the setting of dietary indiscretion.  Her weight was 278 pounds that day and at that time she was on Lasix 40 mg twice daily.  Unfortunately, following that visit, she was not weighing herself regularly.  She was supposed to have had lab work a week later but did not have it done and instead showed up on July 30 for labs.  At that time, creatinine was mildly elevated at 1.14 and she was advised to reduce her Lasix down to 20 mg twice daily.  After doing this, she began to note increasing lower extremity swelling, especially involving the left knee, along with a 10 pound weight gain.  In that setting, on August 18, she presented to the emergency department for evaluation where she was found to be hypertensive at 175/112.  She again admitted to dietary indiscretions, frequently eating [employer] sandwiches for lunch.  Her d-dimer was normal.  Chest x-ray showed mild pulmonary congestion.  She was diuresed and discharged home.  Of note, she was hypokalemic and this was treated as well.  She was seen in heart failure clinic the following day  with recommendation to continue Lasix 40 mg twice daily as well as potassium supplementation and follow-up here today.  On her scale at home, her weight was down about 2 pounds from the other day.  As noted, previous dry weight was felt to be about 278.  She is 286 today.  She has tried to be a little more cautious with salt but has not changed her lunch routine just yet.  She thinks swelling has improved some.  She denies chest pain, palpitations, dyspnea, PND, orthopnea, dizziness, syncope, or early satiety.  Home Medications    Prior to Admission medications     Medication Sig Start Date End Date Taking? Authorizing Provider  albuterol (PROVENTIL HFA;VENTOLIN HFA) 108 (90 Base) MCG/ACT inhaler Inhale 2 puffs into the lungs every 4 (four) hours as needed for wheezing or shortness of breath. q4-6 h PRN for shortness of breath   Yes [provider]  amLODipine (NORVASC) 10 MG tablet TAKE 1 TABLET BY MOUTH  DAILY 12/07/17  Yes Gollan, Tollie Pizza, MD  aspirin 81 MG tablet Take 1 tablet (81 mg total) by mouth daily. 06/28/17  Yes Gollan, Tollie Pizza, MD  atorvastatin (LIPITOR) 20 MG tablet Take 20 mg by mouth daily. 1 tab po QHS   Yes [provider]  carvedilol (COREG) 25 MG tablet Take 25 mg by mouth 2 (two) times daily with a meal.   Yes [provider]  fluticasone (FLONASE) 50 MCG/ACT nasal spray Place 1 spray into both nostrils daily as needed for allergies or rhinitis.   Yes [provider]  furosemide (LASIX) 40 MG tablet Take 1 tablet (40 mg) by mouth twice daily, may take an extra 1/2 tablet (20 mg) once daily as needed for weight gain of 3 lbs overnight 12/26/17  Yes Creig Hines, NP  loratadine (CLARITIN) 10 MG tablet Take 10 mg by mouth daily. Takes daily   Yes [provider]  metFORMIN (GLUCOPHAGE) 500 MG tablet Take 500 mg by mouth 2 (two) times daily with a meal.   Yes [provider]  oxybutynin (DITROPAN) 5 MG tablet Take 5 mg by mouth 2 (two) times daily. 12/25/17  Yes [provider]  polyethylene glycol-electrolytes (NULYTELY/GOLYTELY) 420 g solution Take 4,000 mLs by mouth once.   Yes [provider]  potassium chloride SA (KLOR-CON M20) 20 MEQ tablet Take 1 tablet (20 meq) by mouth twice daily 03/01/18  Yes Dunn, Raymon Mutton, PA-C    Review of Systems    Lower extremity swelling and weight gain as outlined above.  She also had increasing dyspnea which has improved some.  She denies chest pain, palpitations, PND, orthopnea, dizziness, syncope, edema, or early satiety.   All other systems reviewed and are otherwise negative except as noted above.  Physical Exam    VS:  BP 128/86 (BP Location: Left Arm, Patient Position: Sitting, Cuff Size: Large)   Ht 5' 5.5" (1.664 m)   Wt 286 lb 12 oz (130.1 kg)   LMP 03/03/2018   BMI 46.99 kg/m  , BMI Body mass index is 46.99 kg/m. GEN: Obese, in no acute distress.  HEENT: normal.  Neck: Supple, obese, difficult to gauge JVP.  No carotid bruits, or masses. Cardiac: RRR, 1/6 syst murmur @ the upper sternal borders, no rubs, or gallops. No clubbing, cyanosis, 1+ bilateral ankle/calf edema.  Radials/DP/PT 2+ and equal bilaterally.  Respiratory:  Respirations regular and unlabored, clear to auscultation bilaterally. GI: Obese, soft, nontender, nondistended, BS + x  4. MS: no deformity or atrophy. Skin: warm and dry, no rash. Neuro:  Strength and sensation are intact. Psych: Normal affect.  Accessory Clinical Findings    ECG -regular sinus rhythm, 65, LVH with repolarization of normality.  Assessment & Plan    1.  Acute on chronic diastolic congestive heart failure: Patient reports 10 pound weight gain with increasing lower extremity swelling over the past few weeks.  She was seen in the emergency department on 18 August with significant hypertension and volume overload.  She was diuresed and since followed up in heart failure clinic.  She is currently taking Lasix 40 mg twice daily.  She has noticed some improvement in lower extremity swelling and dyspnea.  Weight was down to 285 on her home scale this morning.  She is 286 currently.  As previously noted in May, fluctuations in blood pressure volumes seem to be very closely linked to dietary indiscretion and salt intake.  She has recently been eating [employer] for multiple meals a day.  We discussed the importance of daily weights, sodium restriction, medication compliance, and symptom reporting and she verbalizes understanding.  I encouraged her to contact us for any  weight gain of 2 pounds in 24 hours or 5 pounds over the course of a week.  She verbalized understanding.  I will arrange for follow-up basic metabolic panel next Monday.  She is follow-up in heart failure clinic in a couple of weeks and we can see her in approximately 2 months.  2.  Essential hypertension: Blood pressure stable today at 128/86.  I stressed the importance of medication compliance and sodium restriction.  3.  Hypokalemia: Noted during recent ER visit.  Follow-up basic metabolic panel next week.  4.  Type 2 diabetes mellitus: Remains on metformin and followed by primary care.  5.  Morbid obesity: She is not routinely exercising.  I encouraged her to increase her activity.  6.  Disposition: Follow-up in clinic in 2 months.  Follow-up basic metabolic panel in 5 days.  Nicolasa Ducking, NP 03/20/2018, 5:28 PM

## 2018-03-20 NOTE — Patient Instructions (Addendum)
Medication Instructions: Your physician recommends that you continue on your current medications as directed. Please refer to the Current Medication list given to you today.  If you need a refill on your cardiac medications before your next appointment, please call your pharmacy.   Labwork: Your provider would like for you to return in one week to have the following labs drawn: BMET. Please go to the [facility] entrance and check in at the front desk. You do not need an appointment.   Follow-Up: Your physician wants you to follow-up in 2 months with Dr. Mariah Milling. You will receive a reminder letter in the mail two months in advance. If you don't receive a letter, please call our office at [phone] to schedule this follow-up appointment.   Thank you for choosing [facility] at [facility]!

## 2018-03-25 ENCOUNTER — Other Ambulatory Visit
Admission: RE | Admit: 2018-03-25 | Discharge: 2018-03-25 | Disposition: A | Discharge status: Home / Self Care | Payer: Managed Care, Other (non HMO) | Source: Ambulatory Visit | Attending: Nurse Practitioner | Admitting: Nurse Practitioner

## 2018-03-25 ENCOUNTER — Ambulatory Visit: Payer: Managed Care, Other (non HMO) | Admitting: Cardiovascular Disease

## 2018-03-25 DIAGNOSIS — I10 Essential (primary) hypertension: Secondary | ICD-10-CM

## 2018-03-25 LAB — BASIC METABOLIC PANEL
Anion gap: 8 (ref 5–15)
BUN: 14 mg/dL (ref 6–20)
CALCIUM: 8.7 mg/dL — AB (ref 8.9–10.3)
CO2: 27 mmol/L (ref 22–32)
CREATININE: 0.85 mg/dL (ref 0.44–1.00)
Chloride: 105 mmol/L (ref 98–111)
GFR calc Af Amer: >60 mL/min (ref >60)
GFR calc non Af Amer: >60 mL/min (ref >60)
GLUCOSE: 118 mg/dL — AB (ref 70–99)
Potassium: 3.5 mmol/L (ref 3.5–5.1)
Sodium: 140 mmol/L (ref 135–145)

## 2018-03-26 ENCOUNTER — Other Ambulatory Visit: Payer: Self-pay | Admitting: *Deleted

## 2018-03-26 DIAGNOSIS — I10 Essential (primary) hypertension: Secondary | ICD-10-CM

## 2018-03-26 DIAGNOSIS — I5032 Chronic diastolic (congestive) heart failure: Secondary | ICD-10-CM

## 2018-03-26 MED ORDER — POTASSIUM CHLORIDE CRYS ER 20 MEQ PO TBCR: 40.0000 meq | EXTENDED_RELEASE_TABLET | Freq: Two times a day (BID) | ORAL | Status: DC

## 2018-03-27 ENCOUNTER — Telehealth: Payer: Self-pay | Admitting: Family

## 2018-03-27 NOTE — Telephone Encounter (Signed)
Patient called to report a 2 pound weight gain over the last few days. She says that 2 days ago her weight was 285 lbs, yesterday it was 286 and today it's 287.  No change in her symptoms other than weight gain.   Currently she's taking furosemide 40mg  BID along with potassium BID due to continued low potassium level (3.5).   Instructed patient that if her weight went up anymore that she could take an additional 20mg  furosemide but if she did, she also needed to take an additional potassium.   Patient verbalized understanding of instructions and says that her lab work will be repeated in about 10 days.

## 2018-04-02 ENCOUNTER — Other Ambulatory Visit
Admission: RE | Admit: 2018-04-02 | Discharge: 2018-04-02 | Disposition: A | Discharge status: Home / Self Care | Payer: Managed Care, Other (non HMO) | Source: Ambulatory Visit | Attending: Nurse Practitioner | Admitting: Nurse Practitioner

## 2018-04-02 DIAGNOSIS — I5032 Chronic diastolic (congestive) heart failure: Secondary | ICD-10-CM | POA: Insufficient documentation

## 2018-04-02 DIAGNOSIS — I10 Essential (primary) hypertension: Secondary | ICD-10-CM | POA: Insufficient documentation

## 2018-04-02 LAB — BASIC METABOLIC PANEL
Anion gap: 7 (ref 5–15)
BUN: 8 mg/dL (ref 6–20)
CO2: 29 mmol/L (ref 22–32)
CREATININE: 0.84 mg/dL (ref 0.44–1.00)
Calcium: 8.4 mg/dL — ABNORMAL LOW (ref 8.9–10.3)
Chloride: 106 mmol/L (ref 98–111)
GFR calc Af Amer: >60 mL/min (ref >60)
GFR calc non Af Amer: >60 mL/min (ref >60)
Glucose, Bld: 105 mg/dL — ABNORMAL HIGH (ref 70–99)
POTASSIUM: 3.6 mmol/L (ref 3.5–5.1)
Sodium: 142 mmol/L (ref 135–145)

## 2018-04-22 ENCOUNTER — Encounter: Payer: Self-pay | Admitting: Family

## 2018-04-22 ENCOUNTER — Ambulatory Visit: Payer: Managed Care, Other (non HMO) | Attending: Family | Admitting: Family

## 2018-04-22 VITALS — BP 159/94 | HR 67 | Resp 18 | Ht 65.0 in | Wt 286.0 lb

## 2018-04-22 DIAGNOSIS — E119 Type 2 diabetes mellitus without complications: Secondary | ICD-10-CM | POA: Diagnosis not present

## 2018-04-22 DIAGNOSIS — I110 Hypertensive heart disease with heart failure: Secondary | ICD-10-CM | POA: Diagnosis not present

## 2018-04-22 DIAGNOSIS — M1990 Unspecified osteoarthritis, unspecified site: Secondary | ICD-10-CM | POA: Insufficient documentation

## 2018-04-22 DIAGNOSIS — Z79899 Other long term (current) drug therapy: Secondary | ICD-10-CM | POA: Insufficient documentation

## 2018-04-22 DIAGNOSIS — I5032 Chronic diastolic (congestive) heart failure: Secondary | ICD-10-CM | POA: Diagnosis not present

## 2018-04-22 DIAGNOSIS — Z823 Family history of stroke: Secondary | ICD-10-CM | POA: Insufficient documentation

## 2018-04-22 DIAGNOSIS — Z880 Allergy status to penicillin: Secondary | ICD-10-CM | POA: Diagnosis not present

## 2018-04-22 DIAGNOSIS — Z7982 Long term (current) use of aspirin: Secondary | ICD-10-CM | POA: Diagnosis not present

## 2018-04-22 DIAGNOSIS — Z8249 Family history of ischemic heart disease and other diseases of the circulatory system: Secondary | ICD-10-CM | POA: Diagnosis not present

## 2018-04-22 DIAGNOSIS — Z7984 Long term (current) use of oral hypoglycemic drugs: Secondary | ICD-10-CM | POA: Insufficient documentation

## 2018-04-22 DIAGNOSIS — Z6841 Body Mass Index (BMI) 40.0 and over, adult: Secondary | ICD-10-CM | POA: Insufficient documentation

## 2018-04-22 DIAGNOSIS — E785 Hyperlipidemia, unspecified: Secondary | ICD-10-CM | POA: Diagnosis not present

## 2018-04-22 DIAGNOSIS — I10 Essential (primary) hypertension: Secondary | ICD-10-CM

## 2018-04-22 DIAGNOSIS — I509 Heart failure, unspecified: Secondary | ICD-10-CM | POA: Diagnosis present

## 2018-04-22 NOTE — Progress Notes (Signed)
Patient ID: Heather Norman, female    DOB: 1976-07-21, 42 y.o.   MRN: [id number]  HPI  Heather Norman is a 42 y/o female with a history of arthritis, DM, hyperlipidemia, HTN, morbid obesity and chronic heart failure.  Echo report from 05/16/17 reviewed and showed an EF of 55-60% along with mild MR. Reviewed echo report from 05/15/14 which showed an EF of 60-65% along with mild MR and normal PA pressure.  Was in the ED 03/17/18 due to HF exacerbation. Given IV lasix and released. Was in the ED 12/25/17 due to shortness of breath. Given lasix and released.   She presents today for her follow-up visit with a chief complaint of minimal shortness of breath upon moderate exertion. She describes this as chronic in nature having been present for several years. She has associated fatigue, cough, headaches and difficulty sleeping along with this. She denies any abdominal distention, palpitations, pedal edema, chest pain, dizziness, wheezing or weight gain. She has moved and her new place has an upstairs so she's climbing stairs daily now and has noticed less knee pain/swelling since she's been doing that.   Past Medical History:  Diagnosis Date  . (HFpEF) heart failure with preserved ejection fraction (HCC)    a. 04/2017 Echo: EF 55-60%, no rwma, Mild MR, mildly dil LA. Nl RV fxn.  . Arthritis    kness/hands - no meds  . CHF (congestive heart failure) (HCC)   . Diabetes mellitus without complication (HCC)   . Dyspnea on exertion   . Headache(784.0)    otc med prn  . Heart murmur   . Hyperlipidemia    no meds since pregnancy  . Hypertension   . Morbid obesity (HCC)    Past Surgical History:  Procedure Laterality Date  . CERVICAL CERCLAGE  06/01/2011   Procedure: CERCLAGE CERVICAL;  Surgeon: Philip Aspen, DO;  Location: [facility];  Service: Gynecology;  Laterality: N/A;  REQUESTING PORTABLE ULTARSOUND AT BEDSIDE PT'S EDC:12/02/2011  . CERVICAL CERCLAGE  06/01/2011   Procedure: CERCLAGE CERVICAL;   Surgeon: Philip Aspen, DO;  Location: [facility];  Service: Gynecology;  Laterality: N/A;  REQUESTING PORTABLE ULTARSOUND AT BEDSIDE PT'S EDC:12/02/2011  . endosocpy    . svd     x 2  . tab     x 1  . TUBAL LIGATION     Family History  Problem Relation Age of Onset  . Stroke Mother   . Hypertension Mother   . Hyperlipidemia Mother   . Hypertension Father    Social History   Tobacco Use  . Smoking status: Never Smoker  . Smokeless tobacco: Never Used  Substance Use Topics  . Alcohol use: No   Allergies  Allergen Reactions  . Penicillins Anaphylaxis    Has patient had a PCN reaction causing immediate rash, facial/tongue/throat swelling, SOB or lightheadedness with hypotension: Yes Has patient had a PCN reaction causing severe rash involving mucus membranes or skin necrosis: No Has patient had a PCN reaction that required hospitalization: No Has patient had a PCN reaction occurring within the last 10 years: No If all of the above answers are "NO", then may proceed with Cephalosporin use.   Past Medical History:  Diagnosis Date  . (HFpEF) heart failure with preserved ejection fraction (HCC)    a. 04/2017 Echo: EF 55-60%, no rwma, Mild MR, mildly dil LA. Nl RV fxn.  . Arthritis    kness/hands - no meds  . CHF (congestive heart failure) (HCC)   .  Diabetes mellitus without complication (HCC)   . Dyspnea on exertion   . Headache(784.0)    otc med prn  . Heart murmur   . Hyperlipidemia    no meds since pregnancy  . Hypertension   . Morbid obesity (HCC)    Past Surgical History:  Procedure Laterality Date  . CERVICAL CERCLAGE  06/01/2011   Procedure: CERCLAGE CERVICAL;  Surgeon: Philip Aspen, DO;  Location: [facility];  Service: Gynecology;  Laterality: N/A;  REQUESTING PORTABLE ULTARSOUND AT BEDSIDE PT'S EDC:12/02/2011  . CERVICAL CERCLAGE  06/01/2011   Procedure: CERCLAGE CERVICAL;  Surgeon: Philip Aspen, DO;  Location: [facility];  Service: Gynecology;  Laterality: N/A;   REQUESTING PORTABLE ULTARSOUND AT BEDSIDE PT'S EDC:12/02/2011  . endosocpy    . svd     x 2  . tab     x 1  . TUBAL LIGATION     Family History  Problem Relation Age of Onset  . Stroke Mother   . Hypertension Mother   . Hyperlipidemia Mother   . Hypertension Father    Social History   Tobacco Use  . Smoking status: Never Smoker  . Smokeless tobacco: Never Used  Substance Use Topics  . Alcohol use: No   Allergies  Allergen Reactions  . Penicillins Anaphylaxis    Has patient had a PCN reaction causing immediate rash, facial/tongue/throat swelling, SOB or lightheadedness with hypotension: Yes Has patient had a PCN reaction causing severe rash involving mucus membranes or skin necrosis: No Has patient had a PCN reaction that required hospitalization: No Has patient had a PCN reaction occurring within the last 10 years: No If all of the above answers are "NO", then may proceed with Cephalosporin use.   Prior to Admission medications   Medication Sig Start Date End Date Taking? Authorizing Provider  albuterol (PROVENTIL HFA;VENTOLIN HFA) 108 (90 Base) MCG/ACT inhaler Inhale 2 puffs into the lungs every 4 (four) hours as needed for wheezing or shortness of breath. q4-6 h PRN for shortness of breath   Yes [provider]  amLODipine (NORVASC) 10 MG tablet TAKE 1 TABLET BY MOUTH  DAILY 12/07/17  Yes Gollan, Tollie Pizza, MD  aspirin 81 MG tablet Take 1 tablet (81 mg total) by mouth daily. 06/28/17  Yes Gollan, Tollie Pizza, MD  atorvastatin (LIPITOR) 20 MG tablet Take 20 mg by mouth daily. 1 tab po QHS   Yes [provider]  carvedilol (COREG) 25 MG tablet Take 25 mg by mouth 2 (two) times daily with a meal.   Yes [provider]  fluticasone (FLONASE) 50 MCG/ACT nasal spray Place 1 spray into both nostrils daily as needed for allergies or rhinitis.   Yes [provider]  furosemide (LASIX) 40 MG tablet Take 1 tablet (40 mg) by mouth twice daily, may take  an extra 1/2 tablet (20 mg) once daily as needed for weight gain of 3 lbs overnight 12/26/17  Yes Creig Hines, NP  loratadine (CLARITIN) 10 MG tablet Take 10 mg by mouth daily. Takes daily   Yes [provider]  metFORMIN (GLUCOPHAGE) 500 MG tablet Take 500 mg by mouth 2 (two) times daily with a meal.   Yes [provider]  oxybutynin (DITROPAN) 5 MG tablet Take 5 mg by mouth 2 (two) times daily. 12/25/17  Yes [provider]  polyethylene glycol-electrolytes (NULYTELY/GOLYTELY) 420 g solution Take 4,000 mLs by mouth once.   Yes [provider]  potassium chloride SA (KLOR-CON M20) 20 MEQ  tablet Take 2 tablets (40 mEq total) by mouth 2 (two) times daily. 03/26/18  Yes Creig Hines, NP  valsartan (DIOVAN) 160 MG tablet Take 160 mg by mouth daily.   Yes [provider]    Review of Systems  Constitutional: Positive for fatigue. Negative for appetite change.  HENT: Negative for congestion, postnasal drip and sore throat.   Eyes: Negative.   Respiratory: Positive for cough (dry) and shortness of breath. Negative for chest tightness and wheezing.   Cardiovascular: Negative for chest pain, palpitations and leg swelling.  Gastrointestinal: Negative for abdominal distention, abdominal pain and diarrhea.  Endocrine: Negative.   Genitourinary: Negative.   Musculoskeletal: Positive for arthralgias (left knee). Negative for back pain and neck pain.  Skin: Negative.   Allergic/Immunologic: Negative.   Neurological: Positive for headaches. Negative for dizziness and light-headedness.  Hematological: Negative for adenopathy. Does not bruise/bleed easily.  Psychiatric/Behavioral: Positive for sleep disturbance (sleeping on 5 pillows). Negative for dysphoric mood. The patient is not nervous/anxious.    Vitals:   04/22/18 1230  BP: (!) 159/94  Pulse: 67  Resp: 18  SpO2: 98%  Weight: 286 lb (129.7 kg)  Height: 5\' 5"  (1.651 m)   Wt  Readings from Last 3 Encounters:  04/22/18 286 lb (129.7 kg)  03/20/18 286 lb 12 oz (130.1 kg)  03/18/18 287 lb 8 oz (130.4 kg)   Lab Results  Component Value Date   CREATININE 0.84 04/02/2018   CREATININE 0.85 03/25/2018   CREATININE 0.89 03/17/2018    Physical Exam  Constitutional: She is oriented to person, place, and time. She appears well-developed and well-nourished.  HENT:  Head: Normocephalic and atraumatic.  Neck: Normal range of motion. Neck supple. No JVD present.  Cardiovascular: Normal rate and regular rhythm.  Pulmonary/Chest: Effort normal. She has no wheezes. She has no rales.  Abdominal: Soft. She exhibits no distension. There is no tenderness.  Musculoskeletal: She exhibits no edema or tenderness.  Neurological: She is alert and oriented to person, place, and time.  Skin: Skin is warm and dry.  Psychiatric: She has a normal mood and affect. Her behavior is normal. Thought content normal.  Nursing note and vitals reviewed.   Assessment & Plan:  1: Chronic heart failure with preserved ejection fraction- - NYHA class II - euvolemic today - weighing daily; reminded to call for an overnight weight gain of >2 pounds or a weekly weight gain of >5 pounds - weight down 1.8 pounds since she was last here 1 month ago  - not adding salt to her food; says that she's still eating out a lot but is trying to get salads with an occasional burger - drinking ~ 80 ounces of fluid daily as she says that her mouth gets dry; instructed her to use sugar free candy and to decrease fluid consumption to closer to 60-64 ounces daily - encouraged her to continue walking up the stairs and to start walking them more than once daily - saw cardiology Brion Aliment) 03/20/18 - BNP 12/25/17 was 28.0  2: HTN- - BP mildly elevated today - saw PCP [facility]) 12/20/17 and returns 04/15/18 - BMP from 04/02/18 reviewed and shows sodium 142, potassium 3.6, creatinine 0.84 and GFR >60  3:  Diabetes-  - glucose 2 weeks ago was 105 - taking metformin twice daily - last A1c was 6.6% in 2014 - she said that her PCP told her that she didn't need a glucometer  Patient did not bring her medications  nor a list. Each medication was verbally reviewed with the patient and she was encouraged to bring the bottles to every visit to confirm accuracy of list.  Return in 3 months or sooner for any questions/problems before then.

## 2018-04-22 NOTE — Patient Instructions (Signed)
Continue weighing daily and call for an overnight weight gain of > 2 pounds or a weekly weight gain of >5 pounds. 

## 2018-04-23 ENCOUNTER — Encounter: Payer: Self-pay | Admitting: Family

## 2018-05-19 DIAGNOSIS — E119 Type 2 diabetes mellitus without complications: Secondary | ICD-10-CM | POA: Insufficient documentation

## 2018-05-19 NOTE — Progress Notes (Signed)
Cardiology Office Note  Date:  05/20/2018   ID:  Heather Norman, DOB 1976/01/20, MRN [id number]  PCP:  [facility]   Chief Complaint  Patient presents with  . other    2 month follow up. Meds reviewed by the pt. verbally. Pt. c/o shortness of breath, pain in ankles and left leg pain.     HPI:  Heather Norman a pleasant 42 year old woman with morbid obesity, 280 pounds diabetes type 2, no recent HBA1C hypertension,  chronic diastolic CHF,  who presents for routine office visit for her diastolic CHF and shortness of breath.  Chronic knee pain, on the left Has had previous x-rays showing mild arthritis Knee continues to swell, gives her chronic pain Difficulty standing on cement at work for long periods of time  Sleep disorder Lots of snoring, daytime somnolence, told by family she has apnea  Wrist pain Lots of working on the computer at work  [employer] extra lasix for weight gain For 3 pound weight gain will take Lasix 80 twice daily for 1 day then back down to 40 twice daily Potassium running chronically low  BP better at home and on today's visit  11/2017 ER for hypertensive urgency and volume overload.  This occurred in the setting of dietary indiscretion.  Her weight was 278 pounds that day and at that time she was on Lasix 40 mg twice daily.   Given IV lasix and released  02/2018:ER visit  leg swelling, hypertensive at 175/112.   dietary indiscretions, frequently eating [employer] sandwiches for lunch Given IV lasix and released  Seen in  CHF clinic EKG personally reviewed by myself on todays visit Shows normal sinus rhythm rate 70 bpm T-wave abnormality 1, aVL, V6,   She is taking 4 potassium pills per day No regular exercise program Weight continues to be an issue Poor diet  Other past medical history reviewed  Previously had urinary incontinence issues  Previous trip to [place], was very short of breath walking around She  attributes this to deconditioning  Prior echocardiogram 2015 showing normal LV function, mild MR    PMH:   has a past medical history of (HFpEF) heart failure with preserved ejection fraction (HCC), Arthritis, CHF (congestive heart failure) (HCC), Diabetes mellitus without complication (HCC), Dyspnea on exertion, Headache(784.0), Heart murmur, Hyperlipidemia, Hypertension, and Morbid obesity (HCC).  PSH:    Past Surgical History:  Procedure Laterality Date  . CERVICAL CERCLAGE  06/01/2011   Procedure: CERCLAGE CERVICAL;  Surgeon: Philip Aspen, DO;  Location: [facility];  Service: Gynecology;  Laterality: N/A;  REQUESTING PORTABLE ULTARSOUND AT BEDSIDE PT'S EDC:12/02/2011  . CERVICAL CERCLAGE  06/01/2011   Procedure: CERCLAGE CERVICAL;  Surgeon: Philip Aspen, DO;  Location: [facility];  Service: Gynecology;  Laterality: N/A;  REQUESTING PORTABLE ULTARSOUND AT BEDSIDE PT'S EDC:12/02/2011  . endosocpy    . svd     x 2  . tab     x 1  . TUBAL LIGATION      Current Outpatient Medications  Medication Sig Dispense Refill  . albuterol (PROVENTIL HFA;VENTOLIN HFA) 108 (90 Base) MCG/ACT inhaler Inhale 2 puffs into the lungs every 4 (four) hours as needed for wheezing or shortness of breath. q4-6 h PRN for shortness of breath    . amLODipine (NORVASC) 10 MG tablet TAKE 1 TABLET BY MOUTH  DAILY 30 tablet 0  . aspirin 81 MG tablet Take 1 tablet (81 mg total) by mouth daily. 90 tablet 3  . atorvastatin (  LIPITOR) 20 MG tablet Take 20 mg by mouth daily. 1 tab po QHS    . carvedilol (COREG) 25 MG tablet Take 25 mg by mouth 2 (two) times daily with a meal.    . fluticasone (FLONASE) 50 MCG/ACT nasal spray Place 1 spray into both nostrils daily as needed for allergies or rhinitis.    . furosemide (LASIX) 40 MG tablet Take 1 tablet (40 mg) by mouth twice daily, may take an extra 1/2 tablet (20 mg) once daily as needed for weight gain of 3 lbs overnight 225 tablet 3  . loratadine (CLARITIN) 10 MG tablet  Take 10 mg by mouth daily. Takes daily    . metFORMIN (GLUCOPHAGE) 500 MG tablet Take 500 mg by mouth 2 (two) times daily with a meal.    . oxybutynin (DITROPAN) 5 MG tablet Take 5 mg by mouth 2 (two) times daily.    . polyethylene glycol-electrolytes (NULYTELY/GOLYTELY) 420 g solution Take 4,000 mLs by mouth once.    . potassium chloride SA (KLOR-CON M20) 20 MEQ tablet Take 2 tablets (40 mEq total) by mouth 2 (two) times daily.    . valsartan (DIOVAN) 160 MG tablet Take 160 mg by mouth daily.     No current facility-administered medications for this visit.      Allergies:   Penicillins   Social History:  The patient  reports that she has never smoked. She has never used smokeless tobacco. She reports that she does not drink alcohol or use drugs.   Family History:   family history includes Hyperlipidemia in her mother; Hypertension in her father and mother; Stroke in her mother.    Review of Systems: Review of Systems  Constitutional: Negative.   Respiratory: Negative.   Cardiovascular: Negative.   Gastrointestinal: Negative.   Musculoskeletal: Positive for joint pain.  Neurological: Negative.   Psychiatric/Behavioral: The patient has insomnia.   All other systems reviewed and are negative.    PHYSICAL EXAM: VS:  BP (!) 150/98 (BP Location: Left Arm, Patient Position: Sitting, Cuff Size: Large)   Pulse 70   Ht 5\' 5"  (1.651 m)   Wt 284 lb 8 oz (129 kg)   LMP 04/21/2018   BMI 47.34 kg/m  , BMI Body mass index is 47.34 kg/m. Constitutional:  oriented to person, place, and time. No distress.  HENT:  Head: Normocephalic and atraumatic.  Eyes:  no discharge. No scleral icterus.  Neck: Normal range of motion. Neck supple. No JVD present.  Cardiovascular: Normal rate, regular rhythm, normal heart sounds and intact distal pulses. Exam reveals no gallop and no friction rub. No edema No murmur heard. Pulmonary/Chest: Effort normal and breath sounds normal. No stridor. No  respiratory distress.  no wheezes.  no rales.  no tenderness.  Abdominal: Soft.  no distension.  no tenderness.  Musculoskeletal: Normal range of motion.  no  tenderness or deformity.  Neurological:  normal muscle tone. Coordination normal. No atrophy Skin: Skin is warm and dry. No rash noted. not diaphoretic.  Psychiatric:  normal mood and affect. behavior is normal. Thought content normal.    Recent Labs: 12/25/2017: B Natriuretic Peptide 28.0 03/17/2018: Hemoglobin 11.9; Platelets 362 04/02/2018: BUN 8; Creatinine, Ser 0.84; Potassium 3.6; Sodium 142    Lipid Panel Lab Results  Component Value Date   CHOL 168 01/14/2015   HDL 42 01/14/2015   LDLCALC 106 (H) 01/14/2015   TRIG 102 01/14/2015      Wt Readings from Last 3 Encounters:  05/20/18 284  lb 8 oz (129 kg)  04/22/18 286 lb (129.7 kg)  03/20/18 286 lb 12 oz (130.1 kg)     ASSESSMENT AND PLAN:   Essential hypertension -  Blood pressure continues to run mildly elevated She reports numbers are better at home but does not want to change medications at this time Has headache in the morning after taking her pills Recommend she move amlodipine to the evening If blood pressure does run high we could increase the dose of valsartan hand  Dyspnea, unspecified type - Plan: EKG 12-Lead shortness of breath secondary to obesity, deconditioning Recommended regular exercise program Previously worked with a nutritionist, needs weight loss and conditioning  Chronic diastolic CHF (congestive heart failure) (HCC) - Plan: EKG 12-Lead Taking Lasix 40 twice daily We will increase potassium up to 5 of the 20 mEq pills a day  Morbid obesity We have encouraged continued exercise, careful diet management in an effort to lose weight.  Again discussed with her  Hypokalemia Prescription changed, up to 5 a day   Total encounter time more than 25 minutes  Greater than 50% was spent in counseling and coordination of care with the  patient  Disposition:   F/U  6 months   Orders Placed This Encounter  Procedures  . EKG 12-Lead     Signed, Dossie Arbour, M.D., Ph.D. 05/20/2018  [facility] [place], [place] [phone]

## 2018-05-20 ENCOUNTER — Encounter: Payer: Self-pay | Admitting: Cardiovascular Disease

## 2018-05-20 ENCOUNTER — Ambulatory Visit: Payer: Managed Care, Other (non HMO) | Admitting: Cardiovascular Disease

## 2018-05-20 VITALS — BP 150/98 | HR 70 | Ht 65.0 in | Wt 284.5 lb

## 2018-05-20 DIAGNOSIS — I10 Essential (primary) hypertension: Secondary | ICD-10-CM | POA: Diagnosis not present

## 2018-05-20 DIAGNOSIS — I5032 Chronic diastolic (congestive) heart failure: Secondary | ICD-10-CM

## 2018-05-20 DIAGNOSIS — E119 Type 2 diabetes mellitus without complications: Secondary | ICD-10-CM | POA: Diagnosis not present

## 2018-05-20 DIAGNOSIS — R0683 Snoring: Secondary | ICD-10-CM

## 2018-05-20 MED ORDER — POTASSIUM CHLORIDE CRYS ER 20 MEQ PO TBCR: 100.0000 meq | EXTENDED_RELEASE_TABLET | ORAL | 3 refills | Status: DC

## 2018-05-20 NOTE — Patient Instructions (Addendum)
Medication Instructions:   Please increase potassium up to 5 a day  Move one of the blood pressure pills to the evening  Labwork:  No new labs needed  Testing/Procedures:  No further testing at this time   Follow-Up: It was a pleasure seeing you in the office today. Please call us if you have new issues that need to be addressed before your next appt.  [phone]  Your physician wants you to follow-up in: 6 months.  You will receive a reminder letter in the mail two months in advance. If you don't receive a letter, please call our office to schedule the follow-up appointment.  If you need a refill on your cardiac medications before your next appointment, please call your pharmacy.  For educational health videos Log in to : [email or web address] Or : [email or web address], password : [email or web address]

## 2018-07-14 NOTE — Progress Notes (Deleted)
Patient ID: Heather Norman, female    DOB: 1976-07-21, 42 y.o.   MRN: [id number]  HPI  Heather Norman is a 42 y/o female with a history of arthritis, DM, hyperlipidemia, HTN, morbid obesity and chronic heart failure.  Echo report from 05/16/17 reviewed and showed an EF of 55-60% along with mild MR. Reviewed echo report from 05/15/14 which showed an EF of 60-65% along with mild MR and normal PA pressure.  Was in the ED 03/17/18 due to HF exacerbation. Given IV lasix and released. Was in the ED 12/25/17 due to shortness of breath. Given lasix and released.   She presents today for her follow-up visit with a chief complaint of minimal shortness of breath upon moderate exertion. She describes this as chronic in nature having been present for several years. She has associated fatigue, cough, headaches and difficulty sleeping along with this. She denies any abdominal distention, palpitations, pedal edema, chest pain, dizziness, wheezing or weight gain. She has moved and her new place has an upstairs so she's climbing stairs daily now and has noticed less knee pain/swelling since she's been doing that.   Past Medical History:  Diagnosis Date  . (HFpEF) heart failure with preserved ejection fraction (HCC)    a. 04/2017 Echo: EF 55-60%, no rwma, Mild MR, mildly dil LA. Nl RV fxn.  . Arthritis    kness/hands - no meds  . CHF (congestive heart failure) (HCC)   . Diabetes mellitus without complication (HCC)   . Dyspnea on exertion   . Headache(784.0)    otc med prn  . Heart murmur   . Hyperlipidemia    no meds since pregnancy  . Hypertension   . Morbid obesity (HCC)    Past Surgical History:  Procedure Laterality Date  . CERVICAL CERCLAGE  06/01/2011   Procedure: CERCLAGE CERVICAL;  Surgeon: Philip Aspen, DO;  Location: [facility];  Service: Gynecology;  Laterality: N/A;  REQUESTING PORTABLE ULTARSOUND AT BEDSIDE PT'S EDC:12/02/2011  . CERVICAL CERCLAGE  06/01/2011   Procedure: CERCLAGE CERVICAL;   Surgeon: Philip Aspen, DO;  Location: [facility];  Service: Gynecology;  Laterality: N/A;  REQUESTING PORTABLE ULTARSOUND AT BEDSIDE PT'S EDC:12/02/2011  . endosocpy    . svd     x 2  . tab     x 1  . TUBAL LIGATION     Family History  Problem Relation Age of Onset  . Stroke Mother   . Hypertension Mother   . Hyperlipidemia Mother   . Hypertension Father    Social History   Tobacco Use  . Smoking status: Never Smoker  . Smokeless tobacco: Never Used  Substance Use Topics  . Alcohol use: No   Allergies  Allergen Reactions  . Penicillins Anaphylaxis    Has patient had a PCN reaction causing immediate rash, facial/tongue/throat swelling, SOB or lightheadedness with hypotension: Yes Has patient had a PCN reaction causing severe rash involving mucus membranes or skin necrosis: No Has patient had a PCN reaction that required hospitalization: No Has patient had a PCN reaction occurring within the last 10 years: No If all of the above answers are "NO", then may proceed with Cephalosporin use.   Past Medical History:  Diagnosis Date  . (HFpEF) heart failure with preserved ejection fraction (HCC)    a. 04/2017 Echo: EF 55-60%, no rwma, Mild MR, mildly dil LA. Nl RV fxn.  . Arthritis    kness/hands - no meds  . CHF (congestive heart failure) (HCC)   .  Diabetes mellitus without complication (HCC)   . Dyspnea on exertion   . Headache(784.0)    otc med prn  . Heart murmur   . Hyperlipidemia    no meds since pregnancy  . Hypertension   . Morbid obesity (HCC)    Past Surgical History:  Procedure Laterality Date  . CERVICAL CERCLAGE  06/01/2011   Procedure: CERCLAGE CERVICAL;  Surgeon: Philip Aspen, DO;  Location: [facility];  Service: Gynecology;  Laterality: N/A;  REQUESTING PORTABLE ULTARSOUND AT BEDSIDE PT'S EDC:12/02/2011  . CERVICAL CERCLAGE  06/01/2011   Procedure: CERCLAGE CERVICAL;  Surgeon: Philip Aspen, DO;  Location: [facility];  Service: Gynecology;  Laterality: N/A;   REQUESTING PORTABLE ULTARSOUND AT BEDSIDE PT'S EDC:12/02/2011  . endosocpy    . svd     x 2  . tab     x 1  . TUBAL LIGATION     Family History  Problem Relation Age of Onset  . Stroke Mother   . Hypertension Mother   . Hyperlipidemia Mother   . Hypertension Father    Social History   Tobacco Use  . Smoking status: Never Smoker  . Smokeless tobacco: Never Used  Substance Use Topics  . Alcohol use: No   Allergies  Allergen Reactions  . Penicillins Anaphylaxis    Has patient had a PCN reaction causing immediate rash, facial/tongue/throat swelling, SOB or lightheadedness with hypotension: Yes Has patient had a PCN reaction causing severe rash involving mucus membranes or skin necrosis: No Has patient had a PCN reaction that required hospitalization: No Has patient had a PCN reaction occurring within the last 10 years: No If all of the above answers are "NO", then may proceed with Cephalosporin use.     Review of Systems  Constitutional: Positive for fatigue. Negative for appetite change.  HENT: Negative for congestion, postnasal drip and sore throat.   Eyes: Negative.   Respiratory: Positive for cough (dry) and shortness of breath. Negative for chest tightness and wheezing.   Cardiovascular: Negative for chest pain, palpitations and leg swelling.  Gastrointestinal: Negative for abdominal distention, abdominal pain and diarrhea.  Endocrine: Negative.   Genitourinary: Negative.   Musculoskeletal: Positive for arthralgias (left knee). Negative for back pain and neck pain.  Skin: Negative.   Allergic/Immunologic: Negative.   Neurological: Positive for headaches. Negative for dizziness and light-headedness.  Hematological: Negative for adenopathy. Does not bruise/bleed easily.  Psychiatric/Behavioral: Positive for sleep disturbance (sleeping on 5 pillows). Negative for dysphoric mood. The patient is not nervous/anxious.      Physical Exam  Constitutional: She is oriented  to person, place, and time. She appears well-developed and well-nourished.  HENT:  Head: Normocephalic and atraumatic.  Neck: Normal range of motion. Neck supple. No JVD present.  Cardiovascular: Normal rate and regular rhythm.  Pulmonary/Chest: Effort normal. She has no wheezes. She has no rales.  Abdominal: Soft. She exhibits no distension. There is no abdominal tenderness.  Musculoskeletal:        General: No tenderness or edema.  Neurological: She is alert and oriented to person, place, and time.  Skin: Skin is warm and dry.  Psychiatric: She has a normal mood and affect. Her behavior is normal. Thought content normal.  Nursing note and vitals reviewed.   Assessment & Plan:  1: Chronic heart failure with preserved ejection fraction- - NYHA class II - euvolemic today - weighing daily; reminded to call for an overnight weight gain of >2 pounds or a weekly weight gain of >5  pounds - weight  - not adding salt to her food; says that she's still eating out a lot but is trying to get salads with an occasional burger - drinking ~ 80 ounces of fluid daily as she says that her mouth gets dry; instructed her to use sugar free candy and to decrease fluid consumption to closer to 60-64 ounces daily - encouraged her to continue walking up the stairs and to start walking them more than once daily - saw cardiology [facility]) 03/20/18 - BNP 12/25/17 was 28.0  2: HTN- - BP  - saw PCP [facility]) 12/20/17 and returns 04/15/18 - BMP from 04/02/18 reviewed and shows sodium 142, potassium 3.6, creatinine 0.84 and GFR >60  3: Diabetes-  - glucose 2 weeks ago was 105 - taking metformin twice daily - last A1c was 6.6% in 2014 - she said that her PCP told her that she didn't need a glucometer  Patient did not bring her medications nor a list. Each medication was verbally reviewed with the patient and she was encouraged to bring the bottles to every visit to confirm accuracy of  list.

## 2018-07-15 ENCOUNTER — Ambulatory Visit: Payer: Managed Care, Other (non HMO) | Admitting: Family

## 2018-07-15 ENCOUNTER — Telehealth: Payer: Self-pay | Admitting: Family

## 2018-07-15 NOTE — Telephone Encounter (Signed)
Patient did not show for her [facility] appointment on 07/15/18. Will attempt to reschedule.  

## 2018-08-29 IMAGING — CR DG CHEST 2V
1 series · 2 of 2 positions shown · non-contrast
Comparison: 05/06/2017 and earlier.

CLINICAL DATA: 42-year-old female with progressive shortness of
breath today.

EXAM:
CHEST - 2 VIEW

[Series 1: dg chest 2 view · 0.14mm/px · 2 of 2 slices shown]
[im 1/2]
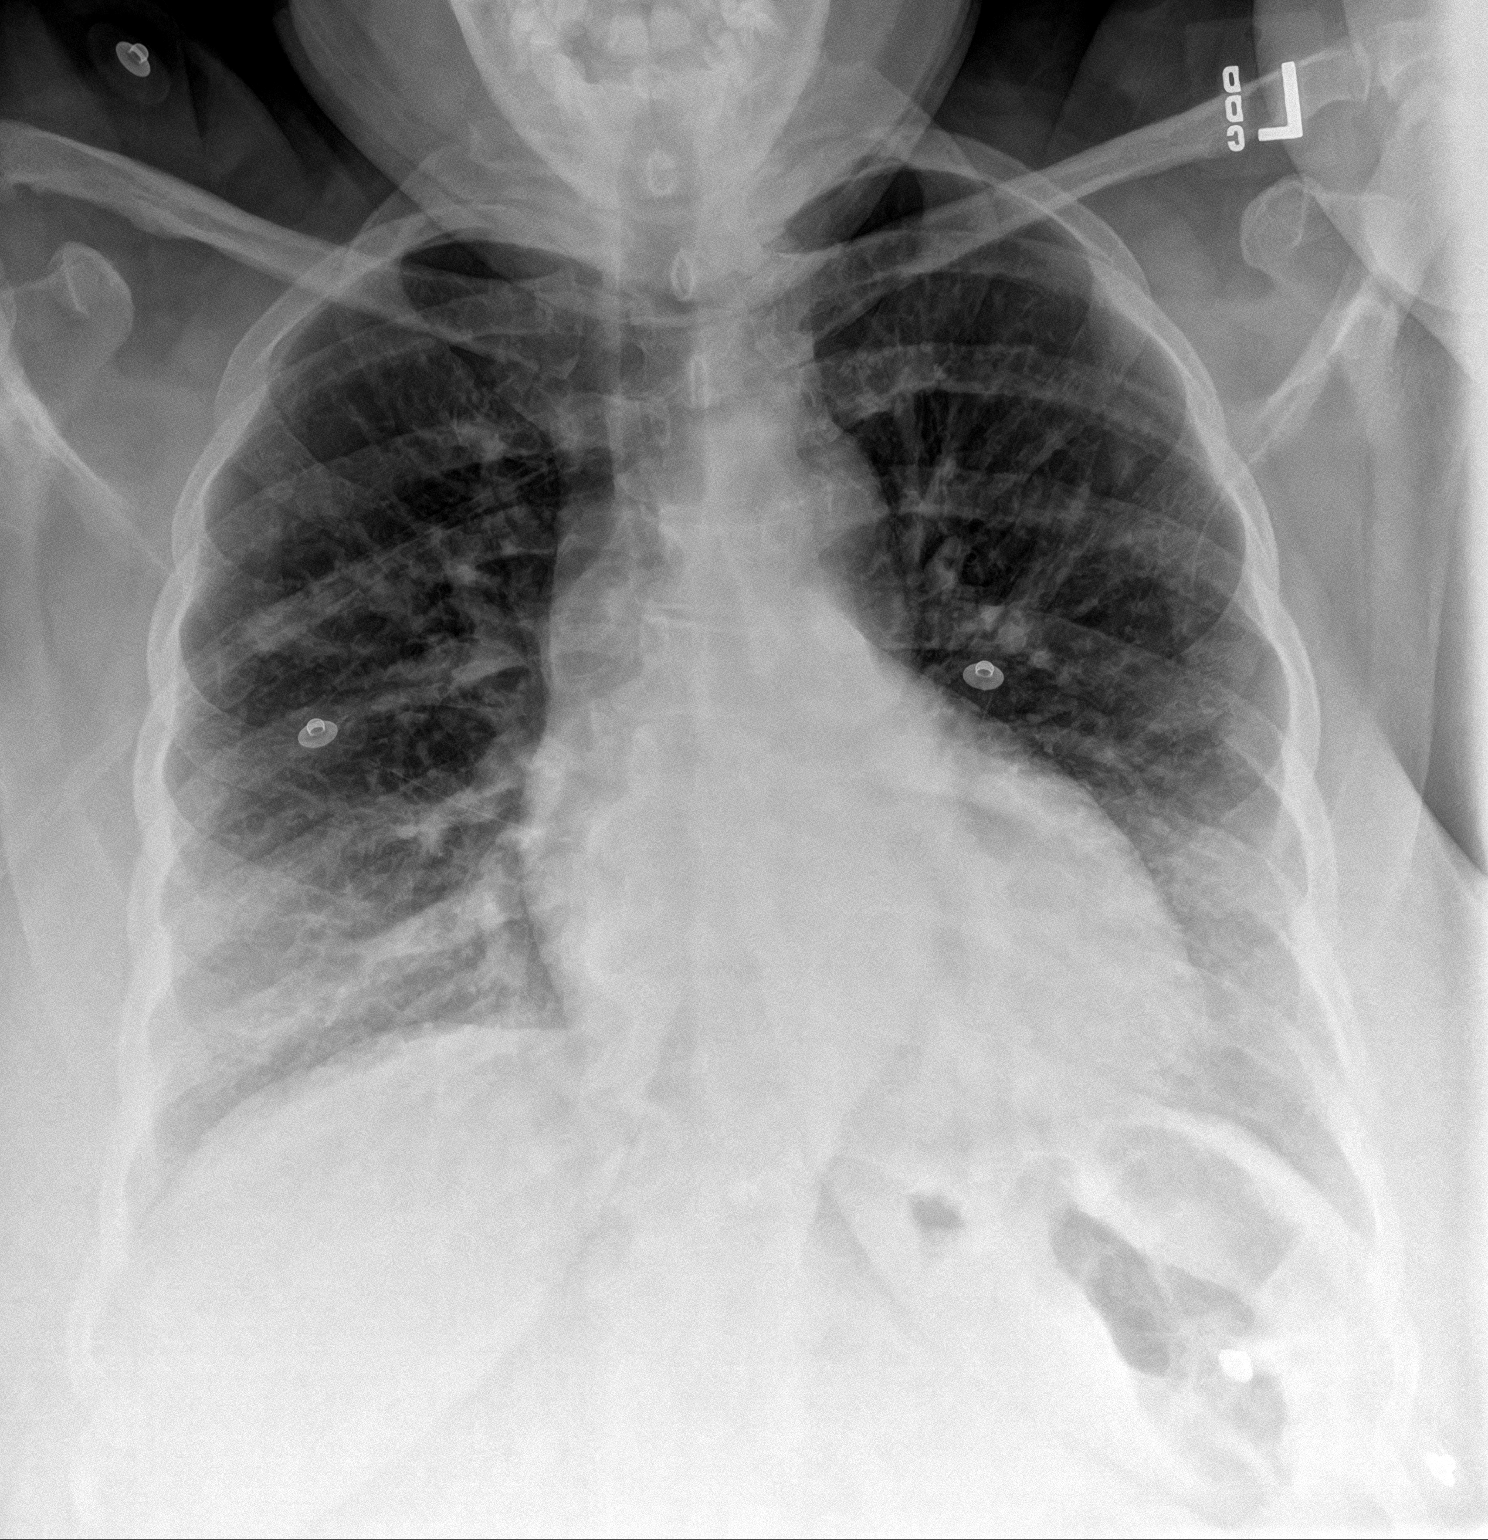
[im 2/2]
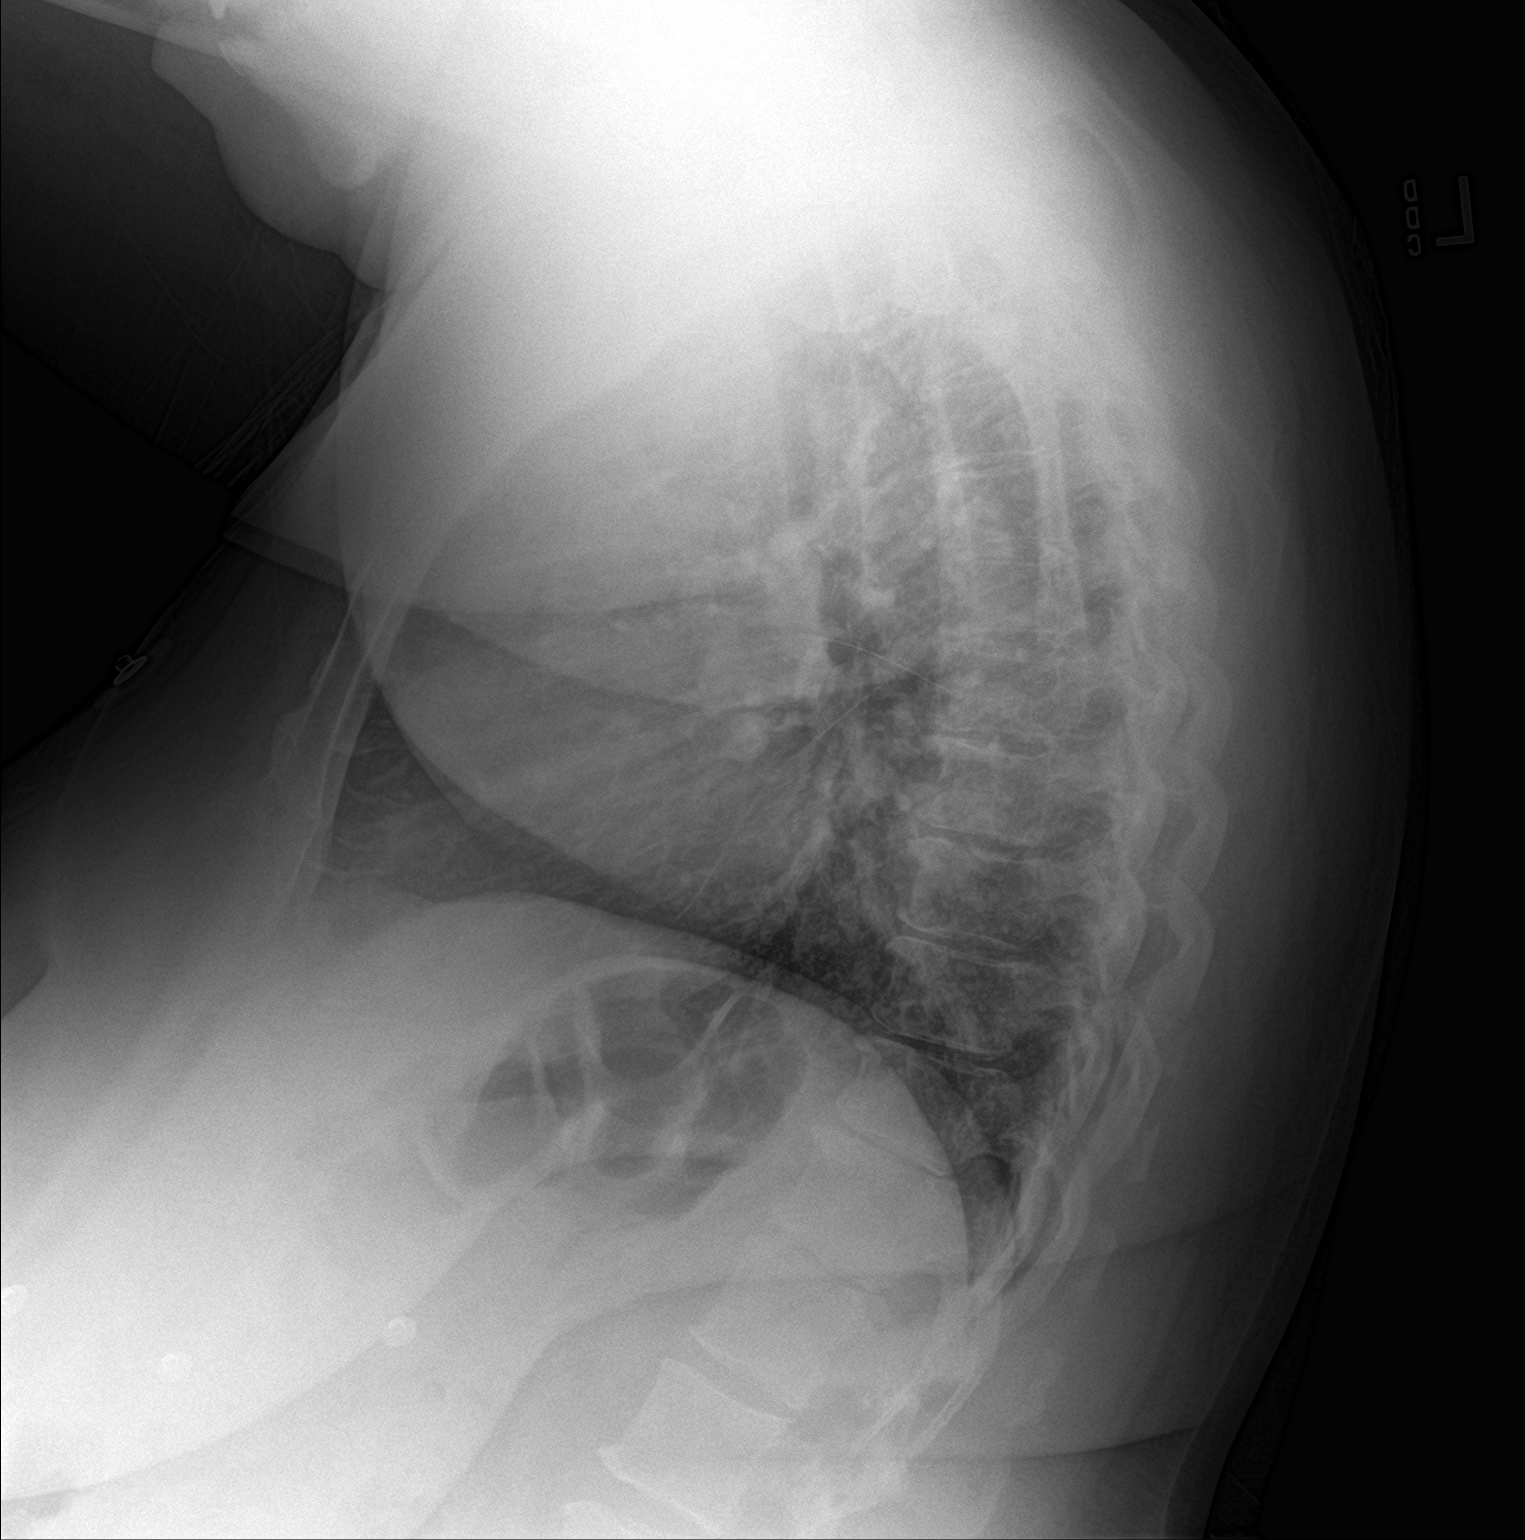

[2 of 2 positions shown; findings below may reference images not displayed]

FINDINGS: Stable mild cardiomegaly. And tortuosity of the thoracic aorta
Visualized tracheal air column is within normal limits. Stable lung
volumes. Pulmonary vascular congestion without overt edema. No
pneumothorax, pleural effusion or confluent pulmonary opacity. No
acute osseous abnormality identified. Negative visible bowel gas
pattern.
IMPRESSION: Pulmonary vascular congestion without overt edema.

## 2018-10-24 ENCOUNTER — Telehealth: Payer: Self-pay | Admitting: Cardiovascular Disease

## 2018-10-24 NOTE — Telephone Encounter (Signed)
Called patient.  Today, BP 170/98, HR 95. Today's weight 287 lb. States usual weight is 284-287 lb. Denies blurred vision, chest pain, dizziness. Swelling in left knee (chronic). Headache but she says this is not new for her and usually always has a headache.   Diastolic BP has been running high. Last week 140's/90's to 100.  Med update: Potassium 20 mEq by mouth BID (she never started taking 5 tablets daily after seeing Dr Mariah Milling on 05/21/19.  Advised patient to go ahead and take an extra 0.5 tablet of furosemide as prescribed since appears she has gained some weight. Routing to Dr Mariah Milling to advise on what to do with BP and if patient should continue any extra furosemide until weight is back down.

## 2018-10-24 NOTE — Telephone Encounter (Signed)
Pt c/o Shortness Of Breath: STAT if SOB developed within the last 24 hours or pt is noticeably SOB on the phone  1. Are you currently SOB (can you hear that pt is SOB on the phone)? Not as bad as yesterday  2. How long have you been experiencing SOB? Always seems to be a little SOB but it was more unusual yesterday   3. Are you SOB when sitting or when up moving around? All the time, more when walking around  4. Are you currently experiencing any other symptoms? Coughing, knees swelling, BP has been high 209/132 and 170/114.  Patient called PCP and they believe it could be a cardio issue. Please call to discuss.

## 2018-10-26 ENCOUNTER — Other Ambulatory Visit: Payer: Self-pay | Admitting: Cardiovascular Disease

## 2018-10-26 NOTE — Telephone Encounter (Signed)
We can increase valsartan up to 320 mg daily Other option would be to add clonidine 0.1 mg BID Extra lasix may also help bring BP down

## 2018-10-29 NOTE — Telephone Encounter (Signed)
Spoke with patient and reviewed providers recommendations. She states that she has been on clonidine before and it caused severe fatigue and had to stop it. Advised that we can increase her valsartan to two tablets once daily and see if that will help. Also reviewed the Furosemide as well and how that can help. She was agreeable to increase Valsartan and does not want a refill because she has a surplus on hand. Today her BP was 170/99 and range has been 160-170's over 99-100's. Instructed her to monitor her blood pressures and to please give [place] a call back or send mychart message with those readings so we can see if this is helping. Reviewed that goal blood pressure would be less than 140/90. She verbalized understanding of our conversation, agreement with plan, and had no further questions at this time.

## 2018-12-02 ENCOUNTER — Telehealth: Payer: Self-pay | Admitting: Cardiovascular Disease

## 2018-12-02 NOTE — Telephone Encounter (Signed)
Pt c/o medication issue:  1. Name of Medication: atorvastatin (LIPITOR)  2. How are you currently taking this medication (dosage and times per day)? 20 MG states she was recently increased to 2 a day  3. Are you having a reaction (difficulty breathing--STAT)? Pain in right arm, hands have been tingling   4. What is your medication issue? Patient calling, states she has been having pain in her right arm and her hands are tingling.  PCP states it may be due to the medication increase.  Please call to discuss.

## 2018-12-03 NOTE — Telephone Encounter (Signed)
We can decrease back to valsartan 160 daily, as I think she had no sx with that Can we set up a webvisit to discuss other options for BP control

## 2018-12-03 NOTE — Telephone Encounter (Signed)
I spoke with the paitent. She states that she gave the wrong medication name yesterday that was increased.  The patient had her valsartan 160 mg- increased to 2 tablets (320 mg) daily by Dr. Mariah Milling on 10/29/18. She states that shortly after that she started to develop pain in her right arm, then it stopped for a little bit, but has started back up again recently. She has also had some intermittent numbness/ tingling to her right hand. She states the muscles in her arm ache like they would if you had been exercising.   She denies any complaints to the left side. She had an e-visit with primary care yestersday and they prescribed a lidocaine patch and brace to see if this will help, but they also advised her they thought this could be medication related.  She has a history of arthritis.  I advised her that I am unsure if her symptoms are from the valsartan, and that we typically relate muscle/ joint pain to statins. However, her symptoms did not start until after the increase in her valsartan. I also advised her it is a little unusual for this to just be on the right side if medication related.  She is aware I will forward to Dr. Mariah Milling to review.  I advised he had recommended clonidine 0.1 mg BID as an alternative as well, but she says she tried this before and it just made her sleep. BP's are running upper 140's/80-90's, which is better for her.  All other medications reviewed with the patient and confirmed in Epic. She is aware we will call her back once Dr. Mariah Milling reviews.  She is agreeable.

## 2018-12-04 ENCOUNTER — Telehealth: Payer: Self-pay

## 2018-12-04 NOTE — Telephone Encounter (Signed)
Returned call to patient with advise from Dr. Mariah Milling.   Pt verbalized understanding for med reduction.   E visit scheduled for Monday 5/11 @ 11:40AM.  Verbally consented to e visit. Reviewed procedure for e visit.   Advised pt to call for any further questions or concerns.

## 2018-12-04 NOTE — Telephone Encounter (Signed)
Virtual Visit Pre-Appointment Phone Call  "(Name), I am calling you today to discuss your upcoming appointment. We are currently trying to limit exposure to the virus that causes COVID-19 by seeing patients at home rather than in the office."  1. "What is the BEST phone number to call the day of the visit?" - include this in appointment notes  2. "Do you have or have access to (through a family member/friend) a smartphone with video capability that we can use for your visit?" a. If yes - list this number in appt notes as "cell" (if different from BEST phone #) and list the appointment type as a VIDEO visit in appointment notes b. If no - list the appointment type as a PHONE visit in appointment notes  3. Confirm consent - "In the setting of the current Covid19 crisis, you are scheduled for a (phone or video) visit with your provider on (date) at (time).  Just as we do with many in-office visits, in order for you to participate in this visit, we must obtain consent.  If you'd like, I can send this to your mychart (if signed up) or email for you to review.  Otherwise, I can obtain your verbal consent now.  All virtual visits are billed to your insurance company just like a normal visit would be.  By agreeing to a virtual visit, we'd like you to understand that the technology does not allow for your provider to perform an examination, and thus may limit your provider's ability to fully assess your condition. If your provider identifies any concerns that need to be evaluated in person, we will make arrangements to do so.  Finally, though the technology is pretty good, we cannot assure that it will always work on either your or our end, and in the setting of a video visit, we may have to convert it to a phone-only visit.  In either situation, we cannot ensure that we have a secure connection.  Are you willing to proceed?" STAFF: Did the patient verbally acknowledge consent to telehealth visit? Document  YES/NO here: YES  4. Advise patient to be prepared - "Two hours prior to your appointment, go ahead and check your blood pressure, pulse, oxygen saturation, and your weight (if you have the equipment to check those) and write them all down. When your visit starts, your provider will ask you for this information. If you have an Apple Watch or Kardia device, please plan to have heart rate information ready on the day of your appointment. Please have a pen and paper handy nearby the day of the visit as well."  5. Give patient instructions for MyChart download to smartphone OR [email or web address] as below if video visit (depending on what platform provider is using)  6. Inform patient they will receive a phone call 15 minutes prior to their appointment time (may be from unknown caller ID) so they should be prepared to answer    TELEPHONE CALL NOTE  Heather Norman has been deemed a candidate for a follow-up tele-health visit to limit community exposure during the Covid-19 pandemic. I spoke with the patient via phone to ensure availability of phone/video source, confirm preferred email & phone number, and discuss instructions and expectations.  I reminded Heather Norman to be prepared with any vital sign and/or heart rhythm information that could potentially be obtained via home monitoring, at the time of her visit. I reminded Heather Norman to expect a phone call prior to  her visit.  Margrett Rud, [place] 12/04/2018 10:32 AM   INSTRUCTIONS FOR DOWNLOADING THE MYCHART APP TO SMARTPHONE  - The patient must first make sure to have activated MyChart and know their login information - If Apple, go to [employer] and type in MyChart in the search bar and download the app. If Android, ask patient to go to [employer] and type in [place] in the search bar and download the app. The app is free but as with any other app downloads, their phone may require them to verify saved payment information or  Apple/Android password.  - The patient will need to then log into the app with their MyChart username and password, and select [facility] as their healthcare provider to link the account. When it is time for your visit, go to the MyChart app, find appointments, and click Begin Video Visit. Be sure to Select Allow for your device to access the Microphone and Camera for your visit. You will then be connected, and your provider will be with you shortly.  **If they have any issues connecting, or need assistance please contact MyChart service desk [phone])**  **If using a computer, in order to ensure the best quality for their visit they will need to use either of the following Internet Browsers: [employer] or Google Chrome**  IF USING DOXIMITY or DOXY.ME - The patient will receive a link just prior to their visit by text.     FULL LENGTH CONSENT FOR TELE-HEALTH VISIT   I hereby voluntarily request, consent and authorize [facility] and its employed or contracted physicians, physician assistants, nurse practitioners or other licensed health care professionals (the Practitioner), to provide me with telemedicine health care services (the "Services") as deemed necessary by the treating Practitioner. I acknowledge and consent to receive the Services by the Practitioner via telemedicine. I understand that the telemedicine visit will involve communicating with the Practitioner through live audiovisual communication technology and the disclosure of certain medical information by electronic transmission. I acknowledge that I have been given the opportunity to request an in-person assessment or other available alternative prior to the telemedicine visit and am voluntarily participating in the telemedicine visit.  I understand that I have the right to withhold or withdraw my consent to the use of telemedicine in the course of my care at any time, without affecting my right to future care  or treatment, and that the Practitioner or I may terminate the telemedicine visit at any time. I understand that I have the right to inspect all information obtained and/or recorded in the course of the telemedicine visit and may receive copies of available information for a reasonable fee.  I understand that some of the potential risks of receiving the Services via telemedicine include:  Marland Kitchen Delay or interruption in medical evaluation due to technological equipment failure or disruption; . Information transmitted may not be sufficient (e.g. poor resolution of images) to allow for appropriate medical decision making by the Practitioner; and/or  . In rare instances, security protocols could fail, causing a breach of personal health information.  Furthermore, I acknowledge that it is my responsibility to provide information about my medical history, conditions and care that is complete and accurate to the best of my ability. I acknowledge that Practitioner's advice, recommendations, and/or decision may be based on factors not within their control, such as incomplete or inaccurate data provided by me or distortions of diagnostic images or specimens that may result from electronic transmissions.  I understand that the practice of medicine is not an exact science and that Practitioner makes no warranties or guarantees regarding treatment outcomes. I acknowledge that I will receive a copy of this consent concurrently upon execution via email to the email address I last provided but may also request a printed copy by calling the office of [facility].    I understand that my insurance will be billed for this visit.   I have read or had this consent read to me. . I understand the contents of this consent, which adequately explains the benefits and risks of the Services being provided via telemedicine.  . I have been provided ample opportunity to ask questions regarding this consent and the Services and have had  my questions answered to my satisfaction. . I give my informed consent for the services to be provided through the use of telemedicine in my medical care  By participating in this telemedicine visit I agree to the above.

## 2018-12-08 NOTE — Progress Notes (Signed)
Virtual Visit via Video Note   This visit type was conducted due to national recommendations for restrictions regarding the COVID-19 Pandemic (e.g. social distancing) in an effort to limit this patient's exposure and mitigate transmission in our community.  Due to her co-morbid illnesses, this patient is at least at moderate risk for complications without adequate follow up.  This format is felt to be most appropriate for this patient at this time.  All issues noted in this document were discussed and addressed.  A limited physical exam was performed with this format.  Please refer to the patient's chart for her consent to telehealth for [facility].   I connected with  Heather Norman on 12/09/18 by a video enabled telemedicine application and verified that I am speaking with the correct person using two identifiers. I discussed the limitations of evaluation and management by telemedicine. The patient expressed understanding and agreed to proceed.   Evaluation Performed:  Follow-up visit  Date:  12/09/2018   ID:  Heather Norman, DOB 10/18/1975, MRN [id number]  Patient Location:  [address] [place] [id number]   Provider location:   [facility] [place]  [facility]:  [facility]  Cardiologist:  Mariah [facility]oll[facility], [facility]   Chief Complaint:  Weight gain, Sob   History of Present Illness:    Heather Norman is a 43 y.o. female who presents via audio/video conferencing for a telehealth visit today.   The patient does not symptoms concerning for COVID-19 infection (fever, chills, cough, or new SHORTNESS OF BREATH).   Patient has a past medical history of morbid obesity, 280 pounds diabetes type 2, no recent HBA1C hypertension,  chronic diastolic CHF,  who presents for routine office visit for her diastolic CHF and shortness of breath.  Had cookout yesterday, Ate too much Bloated, swelling,  Legs/ABD Normal baseline weight 280 she  is now 290 pounds  Vitals discussed with her 148/108, pulse 72 resp 16 Weight 290  Also reports having some recent Right arm hurting, tingling Talked to primary care Tried Lidoderm patches this did not help  No regular exercise program Poor diet in general  Chronic arthritic issues Chronic knee pain,   Other issues include sleep disorder Lots of snoring, daytime somnolence  Typically takes Lasix 40 twice daily but often needs Lasix 80 twice daily for weight gain Compliant with potassium 20 twice a day   Other past medical history reviewed 11/2017 ER for hypertensive urgency and volume overload.  This occurred in the setting of dietary indiscretion.  Her weight was 278 pounds that day and at that time she was on Lasix 40 mg twice daily.   Given IV lasix and released  02/2018:ER visit  leg swelling, hypertensive at 175/112.  dietary indiscretions, frequently eating [employer] sandwiches for lunch Given IV lasix and released  Previously had urinary incontinence issues  Previous trip to [place], was very short of breath walking around She attributes this to deconditioning  Prior echocardiogram 2015 showing normal LV function, mild MR   Prior CV studies:   The following studies were reviewed today:    Past Medical History:  Diagnosis Date  . (HFpEF) heart failure with preserved ejection fraction (HCC)    a. 04/2017 Echo: EF 55-60%, no rwma, Mild MR, mildly dil LA. Nl RV fxn.  . Arthritis    kness/hands - no meds  . CHF (congestive heart failure) (HCC)   . Diabetes mellitus without  complication (HCC)   . Dyspnea on exertion   . Headache(784.0)    otc med prn  . Heart murmur   . Hyperlipidemia    no meds since pregnancy  . Hypertension   . Morbid obesity (HCC)    Past Surgical History:  Procedure Laterality Date  . CERVICAL CERCLAGE  06/01/2011   Procedure: CERCLAGE CERVICAL;  Surgeon: Philip Aspen, DO;  Location: [facility];  Service:  Gynecology;  Laterality: N/A;  REQUESTING PORTABLE ULTARSOUND AT BEDSIDE PT'S EDC:12/02/2011  . CERVICAL CERCLAGE  06/01/2011   Procedure: CERCLAGE CERVICAL;  Surgeon: Philip Aspen, DO;  Location: [facility];  Service: Gynecology;  Laterality: N/A;  REQUESTING PORTABLE ULTARSOUND AT BEDSIDE PT'S EDC:12/02/2011  . endosocpy    . svd     x 2  . tab     x 1  . TUBAL LIGATION       No outpatient medications have been marked as taking for the 12/09/18 encounter (Telemedicine) with [name], MD.     Allergies:   Penicillins   Social History   Tobacco Use  . Smoking status: Never Smoker  . Smokeless tobacco: Never Used  Substance Use Topics  . Alcohol use: No  . Drug use: No     Current Outpatient Medications on File Prior to Visit  Medication Sig Dispense Refill  . albuterol (PROVENTIL HFA;VENTOLIN HFA) 108 (90 Base) MCG/ACT inhaler Inhale 2 puffs into the lungs every 4 (four) hours as needed for wheezing or shortness of breath. q4-6 h PRN for shortness of breath    . amLODipine (NORVASC) 10 MG tablet TAKE 1 TABLET BY MOUTH  DAILY 30 tablet 0  . aspirin 81 MG tablet Take 1 tablet (81 mg total) by mouth daily. 90 tablet 3  . atorvastatin (LIPITOR) 20 MG tablet Take 20 mg by mouth daily. 1 tab po QHS    . carvedilol (COREG) 25 MG tablet Take 25 mg by mouth 2 (two) times daily with a meal.    . fluticasone (FLONASE) 50 MCG/ACT nasal spray Place 1 spray into both nostrils daily as needed for allergies or rhinitis.    . furosemide (LASIX) 40 MG tablet Take 1 tablet (40 mg) by mouth twice daily, may take an extra 1/2 tablet (20 mg) once daily as needed for weight gain of 3 lbs overnight 225 tablet 3  . loratadine (CLARITIN) 10 MG tablet Take 10 mg by mouth daily. Takes daily    . metFORMIN (GLUCOPHAGE) 500 MG tablet Take 500 mg by mouth 2 (two) times daily with a meal.    . oxybutynin (DITROPAN) 5 MG tablet Take 5 mg by mouth 2 (two) times daily.    . polyethylene  glycol-electrolytes (NULYTELY/GOLYTELY) 420 g solution Take 4,000 mLs by mouth once.    . potassium chloride SA (K-DUR) 20 MEQ tablet Take 1 tablet (20 meq) by mouth twice daily    . valsartan (DIOVAN) 160 MG tablet Take 2 tablets (320 mg) by mouth once daily     No current facility-administered medications on file prior to visit.      Family Hx: The patient's family history includes Hyperlipidemia in her mother; Hypertension in her father and mother; Stroke in her mother.  ROS:   Please see the history of present illness.    Review of Systems  Constitutional: Negative.   Respiratory: Positive for shortness of breath.   Cardiovascular: Negative.   Gastrointestinal: Negative.   Musculoskeletal: Negative.   Neurological: Negative.   Psychiatric/Behavioral:  Negative.   All other systems reviewed and are negative.    Labs/Other Tests and Data Reviewed:    Recent Labs: 12/25/2017: B Natriuretic Peptide 28.0 03/17/2018: Hemoglobin 11.9; Platelets 362 04/02/2018: BUN 8; Creatinine, Ser 0.84; Potassium 3.6; Sodium 142   Recent Lipid Panel Lab Results  Component Value Date/Time   CHOL 168 01/14/2015 10:51 AM   CHOL 199 12/13/2012 01:06 AM   TRIG 102 01/14/2015 10:51 AM   TRIG 84 12/13/2012 01:06 AM   HDL 42 01/14/2015 10:51 AM   HDL 52 12/13/2012 01:06 AM   CHOLHDL 4.0 01/14/2015 10:51 AM   LDLCALC 106 (H) 01/14/2015 10:51 AM   LDLCALC 130 (H) 12/13/2012 01:06 AM    Wt Readings from Last 3 Encounters:  05/20/18 284 lb 8 oz (129 kg)  04/22/18 286 lb (129.7 kg)  03/20/18 286 lb 12 oz (130.1 kg)     Exam:    Vital Signs: Vital signs may also be detailed in the HPI There were no vitals taken for this visit.  Wt Readings from Last 3 Encounters:  05/20/18 284 lb 8 oz (129 kg)  04/22/18 286 lb (129.7 kg)  03/20/18 286 lb 12 oz (130.1 kg)   Temp Readings from Last 3 Encounters:  03/17/18 98.2 F (36.8 C) (Oral)  12/25/17 98.7 F (37.1 C) (Oral)  05/06/17 98.4 F (36.9 C)  (Oral)   BP Readings from Last 3 Encounters:  05/20/18 (!) 150/98  04/22/18 (!) 159/94  03/20/18 128/86   Pulse Readings from Last 3 Encounters:  05/20/18 70  04/22/18 67  03/18/18 78    148/108, pulse 72 resp 16 Weight 290  Well nourished, well developed female in no acute distress. Constitutional:  oriented to person, place, and time. No distress.  Head: Normocephalic and atraumatic.  Eyes:  no discharge. No scleral icterus.  Neck: Normal range of motion. Neck supple.  Pulmonary/Chest: No audible wheezing, no distress, appears comfortable Musculoskeletal: Normal range of motion.  no  tenderness or deformity.  Neurological:   Coordination normal. Full exam not performed Skin:  No rash Psychiatric:  normal mood and affect. behavior is normal. Thought content normal.    ASSESSMENT & PLAN:    Chronic diastolic CHF (congestive heart failure) (HCC) Weight is up 10 pounds from her baseline She feels Lasix is not working well for her.  We will change Lasix to torsemide 40 twice daily with 60 potassium daily until weight is back down to 280 pounds At that time we will decrease torsemide down to 20 twice daily potassium 20 twice daily Long discussion concerning her diet, need for low sodium foods  Type 2 diabetes mellitus without complication, without long-term current use of insulin (HCC) We have encouraged continued exercise, careful diet management in an effort to lose weight.  Morbid obesity (HCC) Weight continues to trend higher, poor diet with no regular exercise program  Essential hypertension Blood pressure elevated in the setting of 10 pound weight gain, poor diet Diuretic changes as above should help her blood pressure  Diarrhea, unspecified type Stable  Hypokalemia Stressed importance of taking her potassium Recommend we try to do a BMP when weight is back down to 280 pounds  Right arm pain Recommended she hold the Lipitor for several weeks to see if this  helps her symptoms She could also try NSAIDs  COVID-19 Education: The signs and symptoms of COVID-19 were discussed with the patient and how to seek care for testing (follow up with PCP or arrange E-visit).  The importance of social distancing was discussed today.  Patient Risk:   After full review of this patients clinical status, I feel that they are at least moderate risk at this time.  Time:   Today, I have spent 25 minutes with the patient with telehealth technology discussing the cardiac and medical problems/diagnoses detailed above   10 min spent reviewing the chart prior to patient visit today   Medication Adjustments/Labs and Tests Ordered: Current medicines are reviewed at length with the patient today.  Concerns regarding medicines are outlined above.   Tests Ordered: BMP in 1 month   Medication Changes: Changes as above   Disposition: Follow-up in 3 months   Signed, Julien Nordmann, MD  12/09/2018 12:40 PM    [facility] [address], [place], [place] [address]

## 2018-12-09 ENCOUNTER — Telehealth (INDEPENDENT_AMBULATORY_CARE_PROVIDER_SITE_OTHER): Payer: Managed Care, Other (non HMO) | Admitting: Cardiovascular Disease

## 2018-12-09 ENCOUNTER — Other Ambulatory Visit: Payer: Self-pay

## 2018-12-09 DIAGNOSIS — R197 Diarrhea, unspecified: Secondary | ICD-10-CM

## 2018-12-09 DIAGNOSIS — I5032 Chronic diastolic (congestive) heart failure: Secondary | ICD-10-CM

## 2018-12-09 DIAGNOSIS — I10 Essential (primary) hypertension: Secondary | ICD-10-CM | POA: Diagnosis not present

## 2018-12-09 DIAGNOSIS — E876 Hypokalemia: Secondary | ICD-10-CM

## 2018-12-09 DIAGNOSIS — E119 Type 2 diabetes mellitus without complications: Secondary | ICD-10-CM

## 2018-12-09 MED ORDER — TORSEMIDE 20 MG PO TABS: 40.0000 mg | ORAL_TABLET | Freq: Two times a day (BID) | ORAL | 3 refills | Status: DC

## 2018-12-09 NOTE — Patient Instructions (Addendum)
Medication Instructions:  Your physician has recommended you make the following change in your medication:  1. HOLD Furosemide (Lasix) 2. START Torsemide 20 mg take 2 tablets (40 mg) twice daily 3. INCREASE Potassium (Kdur) 20 mEq to three times a day (Do not fill until she calls) 4. HOLD Atorvastatin (Lipitor) for now due to muscle aches  If you need a refill on your cardiac medications before your next appointment, please call your pharmacy.    Lab work: BMP in one month. Please go to the [facility] Entrance of the hospital around 01/13/2019 to have that done. Check in at the first desk on the right and they will tell you where to go.   If you have labs (blood work) drawn today and your tests are completely normal, you will receive your results only by: Marland Kitchen MyChart Message (if you have MyChart) OR . A paper copy in the mail If you have any lab test that is abnormal or we need to change your treatment, we will call you to review the results.   Testing/Procedures: No new testing needed   Follow-Up: At [facility], you and your health needs are our priority.  As part of our continuing mission to provide you with exceptional heart care, we have created designated Provider Care Teams.  These Care Teams include your primary Cardiologist (physician) and Advanced Practice Providers (APPs -  Physician Assistants and Nurse Practitioners) who all work together to provide you with the care you need, when you need it.  . You will need a follow up appointment in 3 month  . Providers on your designated Care Team:   . Nicolasa Ducking, NP . Eula Listen, PA-C . Marisue Ivan, PA-C  Any Other Special Instructions Will Be Listed Below (If Applicable).  For educational health videos Log in to : [email or web address] Or : [email or web address], password : [email or web address]

## 2018-12-16 ENCOUNTER — Other Ambulatory Visit
Admission: RE | Admit: 2018-12-16 | Discharge: 2018-12-16 | Disposition: A | Discharge status: Home / Self Care | Payer: Managed Care, Other (non HMO) | Source: Ambulatory Visit | Attending: Cardiovascular Disease | Admitting: Cardiovascular Disease

## 2018-12-16 ENCOUNTER — Telehealth: Payer: Self-pay | Admitting: Cardiovascular Disease

## 2018-12-16 DIAGNOSIS — I5032 Chronic diastolic (congestive) heart failure: Secondary | ICD-10-CM | POA: Insufficient documentation

## 2018-12-16 DIAGNOSIS — I10 Essential (primary) hypertension: Secondary | ICD-10-CM

## 2018-12-16 DIAGNOSIS — Z79899 Other long term (current) drug therapy: Secondary | ICD-10-CM

## 2018-12-16 LAB — BASIC METABOLIC PANEL
Anion gap: 9 (ref 5–15)
BUN: 14 mg/dL (ref 6–20)
CO2: 26 mmol/L (ref 22–32)
Calcium: 9.1 mg/dL (ref 8.9–10.3)
Chloride: 103 mmol/L (ref 98–111)
Creatinine, Ser: 0.85 mg/dL (ref 0.44–1.00)
GFR calc Af Amer: >60 mL/min (ref >60)
GFR calc non Af Amer: >60 mL/min (ref >60)
Glucose, Bld: 110 mg/dL — ABNORMAL HIGH (ref 70–99)
Potassium: 3.2 mmol/L — ABNORMAL LOW (ref 3.5–5.1)
Sodium: 138 mmol/L (ref 135–145)

## 2018-12-16 NOTE — Telephone Encounter (Signed)
Pt c/o medication issue:  1. Name of Medication: torsemide   2. How are you currently taking this medication (dosage and times per day)? 20 MG 2 tablets 2 times daily   3. Are you having a reaction (difficulty breathing--STAT)? Feels terrible, cannot move right arm   4. What is your medication issue? Patient started medication recently and has not felt well.  Is not sure she wants to continue taking.  Please call to discuss.

## 2018-12-16 NOTE — Telephone Encounter (Signed)
Spoke with the pt. Pt sts that she was switched for Furosemide to Torsemide by Dr. Mariah Milling on 12/09/18. Pt sts that the next day she began hurting all over. She attributes this to the med change. She took Tylenol for the pain with no relief. Pt reports swelling in her hands, feet and ankles. She denies increased sob. She has been taken Torsemide 20mg  bid She has not taken Torsemide since Fri 5/15. She took 40mg  of Furosemide on Saturday. Pt sts that she felt bad with Furosemide as well. She did not take any diuretic on Sun 5/17.  Pt is up an additional 1lb over the weekend. Her weight this morning was 291lbs. Cautioned the pt interrupting her diuretic, discussed the importance of limiting sodium in her diet. Adv her to resume torsemide 40mg  bid and Potassium 20 mEq bid as instructed by Dr. Mariah Milling. She has not had the bmet recommended, she will go to [facility] medical mall today for lab. Adv her to f/u with her pcp regarding her right arm pain as instructed by DR. Gollan. Adv the pt that I will fwd an update to Dr. Mariah Milling for any additional recommendations. Pt verbalized understanding and voiced appreciation for the call.

## 2018-12-17 NOTE — Telephone Encounter (Signed)
Potassium low which could cause muscle issue,  Otherwise normal labs Is she taking potassium 20 meq BID? If so, will need three a day

## 2018-12-18 MED ORDER — POTASSIUM CHLORIDE CRYS ER 20 MEQ PO TBCR: 20.0000 meq | EXTENDED_RELEASE_TABLET | Freq: Three times a day (TID) | ORAL | 3 refills | Status: DC

## 2018-12-18 NOTE — Telephone Encounter (Signed)
Called patient. She is already taking potassium 20 meq three times a day. She take 1 tablet in the morning and 2 at night. Advised her I would check with Dr Mariah Milling and see if he would like to make any further changes.

## 2018-12-19 NOTE — Telephone Encounter (Signed)
To help maintain her potassium balance can restart spironolactone 25 mg daily Recheck BMP in 1 month

## 2018-12-24 ENCOUNTER — Other Ambulatory Visit: Payer: Self-pay | Admitting: Family

## 2018-12-24 MED ORDER — VALSARTAN 160 MG PO TABS: 160.0000 mg | ORAL_TABLET | Freq: Every day | ORAL | 3 refills | Status: DC

## 2018-12-26 MED ORDER — SPIRONOLACTONE 25 MG PO TABS: 25.0000 mg | ORAL_TABLET | Freq: Every day | ORAL | 3 refills | Status: DC

## 2018-12-26 MED ORDER — VALSARTAN 160 MG PO TABS: 320.0000 mg | ORAL_TABLET | Freq: Every day | ORAL | 2 refills | Status: DC

## 2018-12-26 NOTE — Telephone Encounter (Signed)
Patient verbalized understanding to start spironolactone 25 mg daily and to go to the [facility] in 1 month for repeat lab work [employer].  She also asked for refill of diovan 2 tablets (320 mg) by mouth once a day. It is listed that way on her last visit with Dr Mariah Milling. Refill sent.

## 2019-01-01 ENCOUNTER — Encounter: Payer: Self-pay | Admitting: Family

## 2019-01-01 ENCOUNTER — Other Ambulatory Visit: Payer: Self-pay

## 2019-01-01 ENCOUNTER — Ambulatory Visit: Payer: Managed Care, Other (non HMO) | Attending: Family | Admitting: Family

## 2019-01-01 VITALS — BP 139/91 | HR 66 | Resp 18 | Ht 65.0 in | Wt 291.5 lb

## 2019-01-01 DIAGNOSIS — I5032 Chronic diastolic (congestive) heart failure: Secondary | ICD-10-CM | POA: Diagnosis not present

## 2019-01-01 DIAGNOSIS — R5383 Other fatigue: Secondary | ICD-10-CM | POA: Diagnosis not present

## 2019-01-01 DIAGNOSIS — Z8249 Family history of ischemic heart disease and other diseases of the circulatory system: Secondary | ICD-10-CM | POA: Insufficient documentation

## 2019-01-01 DIAGNOSIS — R0609 Other forms of dyspnea: Secondary | ICD-10-CM | POA: Diagnosis not present

## 2019-01-01 DIAGNOSIS — Z79899 Other long term (current) drug therapy: Secondary | ICD-10-CM | POA: Diagnosis not present

## 2019-01-01 DIAGNOSIS — M1990 Unspecified osteoarthritis, unspecified site: Secondary | ICD-10-CM | POA: Diagnosis not present

## 2019-01-01 DIAGNOSIS — Z7984 Long term (current) use of oral hypoglycemic drugs: Secondary | ICD-10-CM | POA: Diagnosis not present

## 2019-01-01 DIAGNOSIS — R200 Anesthesia of skin: Secondary | ICD-10-CM | POA: Diagnosis not present

## 2019-01-01 DIAGNOSIS — I110 Hypertensive heart disease with heart failure: Secondary | ICD-10-CM | POA: Diagnosis not present

## 2019-01-01 DIAGNOSIS — Z7982 Long term (current) use of aspirin: Secondary | ICD-10-CM | POA: Diagnosis not present

## 2019-01-01 DIAGNOSIS — R140 Abdominal distension (gaseous): Secondary | ICD-10-CM | POA: Insufficient documentation

## 2019-01-01 DIAGNOSIS — E119 Type 2 diabetes mellitus without complications: Secondary | ICD-10-CM | POA: Diagnosis not present

## 2019-01-01 DIAGNOSIS — G479 Sleep disorder, unspecified: Secondary | ICD-10-CM | POA: Diagnosis not present

## 2019-01-01 DIAGNOSIS — E785 Hyperlipidemia, unspecified: Secondary | ICD-10-CM | POA: Diagnosis not present

## 2019-01-01 DIAGNOSIS — I10 Essential (primary) hypertension: Secondary | ICD-10-CM

## 2019-01-01 DIAGNOSIS — R0602 Shortness of breath: Secondary | ICD-10-CM | POA: Insufficient documentation

## 2019-01-01 DIAGNOSIS — R011 Cardiac murmur, unspecified: Secondary | ICD-10-CM | POA: Insufficient documentation

## 2019-01-01 DIAGNOSIS — R51 Headache: Secondary | ICD-10-CM | POA: Diagnosis not present

## 2019-01-01 NOTE — Progress Notes (Signed)
Patient ID: Heather Norman, female    DOB: March 28, 1976, 43 y.o.   MRN: [id number]  HPI  Heather Norman is a 43 y/o female with a history of arthritis, DM, hyperlipidemia, HTN, morbid obesity and chronic heart failure.  Echo report from 05/16/17 reviewed and showed an EF of 55-60% along with mild MR. Reviewed echo report from 05/15/14 which showed an EF of 60-65% along with mild MR and normal PA pressure.  Has not been in the ED or admitted in the last 6 months.   She presents today for her follow-up visit with a chief complaint of minimal shortness of breath upon moderate exertion. She describes this as chronic in nature having been present for several years. She has associated fatigue, right arm numbness, abdominal distention, gradual weight gain and difficulty sleeping. She denies any palpitations, pedal edema, dizziness or cough. Says that she could possibly have carpal tunnel syndrome in her right wrist or arthritis.   Past Medical History:  Diagnosis Date  . (HFpEF) heart failure with preserved ejection fraction (HCC)    a. 04/2017 Echo: EF 55-60%, no rwma, Mild MR, mildly dil LA. Nl RV fxn.  . Arthritis    kness/hands - no meds  . CHF (congestive heart failure) (HCC)   . Diabetes mellitus without complication (HCC)   . Dyspnea on exertion   . Headache(784.0)    otc med prn  . Heart murmur   . Hyperlipidemia    no meds since pregnancy  . Hypertension   . Morbid obesity (HCC)    Past Surgical History:  Procedure Laterality Date  . CERVICAL CERCLAGE  06/01/2011   Procedure: CERCLAGE CERVICAL;  Surgeon: Philip Aspen, DO;  Location: [facility];  Service: Gynecology;  Laterality: N/A;  REQUESTING PORTABLE ULTARSOUND AT BEDSIDE PT'S EDC:12/02/2011  . CERVICAL CERCLAGE  06/01/2011   Procedure: CERCLAGE CERVICAL;  Surgeon: Philip Aspen, DO;  Location: [facility];  Service: Gynecology;  Laterality: N/A;  REQUESTING PORTABLE ULTARSOUND AT BEDSIDE PT'S EDC:12/02/2011  . endosocpy    . svd      x 2  . tab     x 1  . TUBAL LIGATION     Family History  Problem Relation Age of Onset  . Stroke Mother   . Hypertension Mother   . Hyperlipidemia Mother   . Hypertension Father    Social History   Tobacco Use  . Smoking status: Never Smoker  . Smokeless tobacco: Never Used  Substance Use Topics  . Alcohol use: No   Allergies  Allergen Reactions  . Penicillins Anaphylaxis    Has patient had a PCN reaction causing immediate rash, facial/tongue/throat swelling, SOB or lightheadedness with hypotension: Yes Has patient had a PCN reaction causing severe rash involving mucus membranes or skin necrosis: No Has patient had a PCN reaction that required hospitalization: No Has patient had a PCN reaction occurring within the last 10 years: No If all of the above answers are "NO", then may proceed with Cephalosporin use.   Past Medical History:  Diagnosis Date  . (HFpEF) heart failure with preserved ejection fraction (HCC)    a. 04/2017 Echo: EF 55-60%, no rwma, Mild MR, mildly dil LA. Nl RV fxn.  . Arthritis    kness/hands - no meds  . CHF (congestive heart failure) (HCC)   . Diabetes mellitus without complication (HCC)   . Dyspnea on exertion   . Headache(784.0)    otc med prn  . Heart murmur   . Hyperlipidemia  no meds since pregnancy  . Hypertension   . Morbid obesity (HCC)    Past Surgical History:  Procedure Laterality Date  . CERVICAL CERCLAGE  06/01/2011   Procedure: CERCLAGE CERVICAL;  Surgeon: Philip Aspen, DO;  Location: [facility];  Service: Gynecology;  Laterality: N/A;  REQUESTING PORTABLE ULTARSOUND AT BEDSIDE PT'S EDC:12/02/2011  . CERVICAL CERCLAGE  06/01/2011   Procedure: CERCLAGE CERVICAL;  Surgeon: Philip Aspen, DO;  Location: [facility];  Service: Gynecology;  Laterality: N/A;  REQUESTING PORTABLE ULTARSOUND AT BEDSIDE PT'S EDC:12/02/2011  . endosocpy    . svd     x 2  . tab     x 1  . TUBAL LIGATION     Family History  Problem Relation Age of  Onset  . Stroke Mother   . Hypertension Mother   . Hyperlipidemia Mother   . Hypertension Father    Social History   Tobacco Use  . Smoking status: Never Smoker  . Smokeless tobacco: Never Used  Substance Use Topics  . Alcohol use: No   Allergies  Allergen Reactions  . Penicillins Anaphylaxis    Has patient had a PCN reaction causing immediate rash, facial/tongue/throat swelling, SOB or lightheadedness with hypotension: Yes Has patient had a PCN reaction causing severe rash involving mucus membranes or skin necrosis: No Has patient had a PCN reaction that required hospitalization: No Has patient had a PCN reaction occurring within the last 10 years: No If all of the above answers are "NO", then may proceed with Cephalosporin use.   Prior to Admission medications   Medication Sig Start Date End Date Taking? Authorizing Provider  amLODipine (NORVASC) 10 MG tablet TAKE 1 TABLET BY MOUTH  DAILY 12/07/17  Yes Gollan, Tollie Pizza, MD  aspirin 81 MG tablet Take 1 tablet (81 mg total) by mouth daily. 06/28/17  Yes [name], MD  carvedilol (COREG) 25 MG tablet Take 25 mg by mouth 2 (two) times daily with a meal.   Yes [provider]  fluticasone (FLONASE) 50 MCG/ACT nasal spray Place 1 spray into both nostrils daily as needed for allergies or rhinitis.   Yes [provider]  loratadine (CLARITIN) 10 MG tablet Take 10 mg by mouth daily. Takes daily   Yes [provider]  metFORMIN (GLUCOPHAGE) 500 MG tablet Take 500 mg by mouth 2 (two) times daily with a meal.   Yes [provider]  oxybutynin (DITROPAN) 5 MG tablet Take 5 mg by mouth 2 (two) times daily. 12/25/17  Yes [provider]  potassium chloride SA (K-DUR) 20 MEQ tablet Take 1 tablet (20 mEq total) by mouth 3 (three) times daily. 12/18/18  Yes [name], MD  spironolactone (ALDACTONE) 25 MG tablet Take 1 tablet (25 mg total) by mouth daily. 12/26/18 03/26/19 Yes Gollan, Tollie Pizza, MD  torsemide (DEMADEX) 20 MG tablet Take 2 tablets (40 mg total) by mouth 2 (two) times daily. 12/09/18  Yes [name], MD  valsartan (DIOVAN) 160 MG tablet Take 2 tablets (320 mg total) by mouth daily. 12/26/18  Yes Gollan, Tollie Pizza, MD  albuterol (PROVENTIL HFA;VENTOLIN HFA) 108 (90 Base) MCG/ACT inhaler Inhale 2 puffs into the lungs every 4 (four) hours as needed for wheezing or shortness of breath. q4-6 h PRN for shortness of breath    [provider]    Review of Systems  Constitutional: Positive for fatigue. Negative for appetite change.  HENT: Negative for congestion, postnasal drip and sore throat.  Eyes: Negative.   Respiratory: Positive for shortness of breath (improving). Negative for cough, chest tightness and wheezing.   Cardiovascular: Negative for chest pain, palpitations and leg swelling.  Gastrointestinal: Positive for abdominal distention ("feel bloated"). Negative for abdominal pain and diarrhea.  Endocrine: Negative.   Genitourinary: Negative.   Musculoskeletal: Positive for arthralgias (feet pain). Negative for back pain and neck pain.  Skin: Negative.   Allergic/Immunologic: Negative.   Neurological: Positive for numbness (right arm) and headaches. Negative for dizziness and light-headedness.  Hematological: Negative for adenopathy. Does not bruise/bleed easily.  Psychiatric/Behavioral: Positive for sleep disturbance (sleeping on 5 pillows). Negative for dysphoric mood. The patient is not nervous/anxious.    Vitals:   01/01/19 1250  BP: (!) 139/91  Pulse: 66  Resp: 18  SpO2: 98%  Weight: 291 lb 8 oz (132.2 kg)  Height: 5\' 5"  (1.651 m)   Wt Readings from Last 3 Encounters:  01/01/19 291 lb 8 oz (132.2 kg)  05/20/18 284 lb 8 oz (129 kg)  04/22/18 286 lb (129.7 kg)   Lab Results  Component Value Date   CREATININE 0.85 12/16/2018   CREATININE 0.84 04/02/2018   CREATININE 0.85 03/25/2018    Physical Exam  Constitutional: She is  oriented to person, place, and time. She appears well-developed and well-nourished.  HENT:  Head: Normocephalic and atraumatic.  Neck: Normal range of motion. Neck supple. No JVD present.  Cardiovascular: Normal rate and regular rhythm.  Pulmonary/Chest: Effort normal. She has no wheezes. She has no rales.  Abdominal: Soft. She exhibits no distension. There is no abdominal tenderness.  Musculoskeletal:        General: No tenderness or edema.  Neurological: She is alert and oriented to person, place, and time.  Skin: Skin is warm and dry.  Psychiatric: She has a normal mood and affect. Her behavior is normal. Thought content normal.  Nursing note and vitals reviewed.   Assessment & Plan:  1: Chronic heart failure with preserved ejection fraction- - NYHA class II - euvolemic today - weighing daily; reminded to call for an overnight weight gain of >2 pounds or a weekly weight gain of >5 pounds - weight up 5 pounds from last visit 9 months ago - not adding salt to her food and is trying to follow a low sodium diet - she is not sure how much fluid she is drinking but says that she's probably drinking "too much" as her mouth stays dry - explained the importance of drinking ~ 60-64 ounces of fluid / day and that she needed to measure how much she was drinking - encouraged her to be as active as possible - had telemedicine visit with cardiology Mariah Milling) 12/09/2018 - BNP 12/25/17 was 28.0  2: HTN- - BP mildly elevated today (139/91) although improved from cardiology visit - sees PCP [facility])  - BMP from 12/16/2018 reviewed and shows sodium 138, potassium 3.2, creatinine 0.85 and GFR >60  3: Diabetes-  - not checking her glucose at home - last A1c was 6.6% in 2014   Patient did not bring her medications nor a list. Each medication was verbally reviewed with the patient and she was encouraged to bring the bottles to every visit to confirm accuracy of list.  Return in 4  months or sooner for any questions/problems before then.

## 2019-01-01 NOTE — Patient Instructions (Signed)
Continue weighing daily and call for an overnight weight gain of > 2 pounds or a weekly weight gain of >5 pounds. 

## 2019-01-20 ENCOUNTER — Telehealth: Payer: Self-pay | Admitting: Cardiovascular Disease

## 2019-01-20 MED ORDER — POTASSIUM CHLORIDE 20 MEQ/15ML (10%) PO SOLN: 60.0000 meq | Freq: Every day | ORAL | 3 refills | Status: DC

## 2019-01-20 NOTE — Telephone Encounter (Signed)
I spoke with the patient regarding her potassium. She states she is having a very hard time swallowing her potassium. She has tried crushing it and putting it in yogurt and she can't tolerate the grainy feel/ taste. She states she is willing to try bananas, even though she doesn't care for those and put it in a smoothie.  I advised that it is hard to regulate how much potassium she is taking in if she just eats bananas and if she doesn't like this, I'm afraid that compliance might be an issue for her.  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 I have advised her I can speak with her pharmacy about changing her to liquid potassium. She is agreeable with this. She uses [facility] on [address]   She confirms she is taking potassium 20 meq- 3 tablets (60 meq) once daily.

## 2019-01-20 NOTE — Telephone Encounter (Signed)
Please call regarding potassium. Patient states she is unable to take potassium, but asks how many bananas would she have to eat . Patient will put them in a smoothie. Please call and advise.

## 2019-01-20 NOTE — Telephone Encounter (Signed)
I spoke with [facility]. They do have liquid potassium chloride in stock. RX reads 48meq/ 15 mL. Per pharmacy, they have the 473 mL bottles.  RX sent to the pharmacy and the patient is aware. She is due for repeat labs on Monday next week and has been advised to keep that appointment.

## 2019-05-05 ENCOUNTER — Ambulatory Visit: Payer: Managed Care, Other (non HMO) | Admitting: Family

## 2019-05-12 ENCOUNTER — Other Ambulatory Visit: Payer: Self-pay

## 2019-05-12 ENCOUNTER — Encounter: Payer: Self-pay | Admitting: Family

## 2019-05-12 ENCOUNTER — Ambulatory Visit: Payer: Managed Care, Other (non HMO) | Attending: Family | Admitting: Family

## 2019-05-12 ENCOUNTER — Encounter: Payer: Self-pay | Admitting: *Deleted

## 2019-05-12 ENCOUNTER — Telehealth: Payer: Self-pay | Admitting: Family

## 2019-05-12 VITALS — BP 148/96 | HR 79 | Temp 98.6°F | Resp 20 | Ht 65.0 in | Wt 282.6 lb

## 2019-05-12 DIAGNOSIS — R5383 Other fatigue: Secondary | ICD-10-CM | POA: Diagnosis not present

## 2019-05-12 DIAGNOSIS — R0683 Snoring: Secondary | ICD-10-CM | POA: Diagnosis not present

## 2019-05-12 DIAGNOSIS — M1990 Unspecified osteoarthritis, unspecified site: Secondary | ICD-10-CM | POA: Insufficient documentation

## 2019-05-12 DIAGNOSIS — I110 Hypertensive heart disease with heart failure: Secondary | ICD-10-CM | POA: Insufficient documentation

## 2019-05-12 DIAGNOSIS — Z7984 Long term (current) use of oral hypoglycemic drugs: Secondary | ICD-10-CM | POA: Insufficient documentation

## 2019-05-12 DIAGNOSIS — E119 Type 2 diabetes mellitus without complications: Secondary | ICD-10-CM | POA: Insufficient documentation

## 2019-05-12 DIAGNOSIS — E785 Hyperlipidemia, unspecified: Secondary | ICD-10-CM | POA: Insufficient documentation

## 2019-05-12 DIAGNOSIS — M79671 Pain in right foot: Secondary | ICD-10-CM | POA: Insufficient documentation

## 2019-05-12 DIAGNOSIS — Z7982 Long term (current) use of aspirin: Secondary | ICD-10-CM | POA: Diagnosis not present

## 2019-05-12 DIAGNOSIS — R140 Abdominal distension (gaseous): Secondary | ICD-10-CM | POA: Diagnosis not present

## 2019-05-12 DIAGNOSIS — I5032 Chronic diastolic (congestive) heart failure: Secondary | ICD-10-CM | POA: Insufficient documentation

## 2019-05-12 DIAGNOSIS — R200 Anesthesia of skin: Secondary | ICD-10-CM | POA: Insufficient documentation

## 2019-05-12 DIAGNOSIS — Z8249 Family history of ischemic heart disease and other diseases of the circulatory system: Secondary | ICD-10-CM | POA: Insufficient documentation

## 2019-05-12 DIAGNOSIS — R0602 Shortness of breath: Secondary | ICD-10-CM | POA: Insufficient documentation

## 2019-05-12 DIAGNOSIS — Z79899 Other long term (current) drug therapy: Secondary | ICD-10-CM | POA: Diagnosis not present

## 2019-05-12 DIAGNOSIS — I10 Essential (primary) hypertension: Secondary | ICD-10-CM

## 2019-05-12 LAB — BASIC METABOLIC PANEL
Anion gap: 9 (ref 5–15)
BUN: 11 mg/dL (ref 6–20)
CO2: 28 mmol/L (ref 22–32)
Calcium: 8.2 mg/dL — ABNORMAL LOW (ref 8.9–10.3)
Chloride: 101 mmol/L (ref 98–111)
Creatinine, Ser: 1.04 mg/dL — ABNORMAL HIGH (ref 0.44–1.00)
GFR calc Af Amer: >60 mL/min (ref >60)
GFR calc non Af Amer: >60 mL/min (ref >60)
Glucose, Bld: 131 mg/dL — ABNORMAL HIGH (ref 70–99)
Potassium: 2.8 mmol/L — ABNORMAL LOW (ref 3.5–5.1)
Sodium: 138 mmol/L (ref 135–145)

## 2019-05-12 NOTE — Progress Notes (Signed)
Patient ID: Heather Norman, female    DOB: 1975-10-18, 43 y.o.   MRN: [id number]  HPI  [name] is a 43 y/o female with a history of arthritis, DM, hyperlipidemia, HTN, morbid obesity and chronic heart failure.  Echo report from 05/16/17 reviewed and showed an EF of 55-60% along with mild MR. Reviewed echo report from 05/15/14 which showed an EF of 60-65% along with mild MR and normal PA pressure.  Has not been in the ED or admitted in the last 6 months.   She presents today for her follow-up visit with a chief complaint of minimal shortness of breath with moderate exertion. She was able to walk [place] medical mall to the office, however she had to sit and catch her breath once she got here. This is associated with fatigue, abdominal bloating, headaches, numbness at times in her hands, right foot pain, and trouble sleeping. She hasn't weighed herself the past several mornings. She does not add salt to her foods.   Past Medical History:  Diagnosis Date  . (HFpEF) heart failure with preserved ejection fraction ([place])    a. 04/2017 Echo: EF 55-60%, no rwma, Mild MR, mildly dil LA. Nl RV fxn.  . Arthritis    kness/hands - no meds  . CHF (congestive heart failure) ([place])   . Diabetes mellitus without complication ([place])   . Dyspnea on exertion   . Headache(784.0)    otc med prn  . Heart murmur   . Hyperlipidemia    no meds since pregnancy  . Hypertension   . Morbid obesity ([place])    Past Surgical History:  Procedure Laterality Date  . CERVICAL CERCLAGE  06/01/2011   Procedure: CERCLAGE CERVICAL;  Surgeon: Heather Kenner, Heather Norman;  Location: [place] [facility];  Service: Gynecology;  Laterality: N/A;  REQUESTING PORTABLE ULTARSOUND AT BEDSIDE PT'S EDC:12/02/2011  . CERVICAL CERCLAGE  06/01/2011   Procedure: CERCLAGE CERVICAL;  Surgeon: Heather Kenner, Heather Norman;  Location: [place] [facility];  Service: Gynecology;  Laterality: N/A;  REQUESTING PORTABLE ULTARSOUND AT BEDSIDE PT'S EDC:12/02/2011  . endosocpy    . svd      x 2  . tab     x 1  . TUBAL LIGATION     Family History  Problem Relation Age of Onset  . Stroke Heather Norman   . Hypertension Heather Norman   . Hyperlipidemia Heather Norman   . Hypertension Heather Norman    Social History   Tobacco Use  . Smoking status: Never Smoker  . Smokeless tobacco: Never Used  Substance Use Topics  . Alcohol use: No   Allergies  Allergen Reactions  . Penicillins Anaphylaxis    Has patient had a PCN reaction causing immediate rash, facial/tongue/throat swelling, SOB or lightheadedness with hypotension: Yes Has patient had a PCN reaction causing severe rash involving mucus membranes or skin necrosis: No Has patient had a PCN reaction that required hospitalization: No Has patient had a PCN reaction occurring within the last 10 years: No If all of the above answers are "NO", then may proceed with Cephalosporin use.   Prior to Admission medications   Medication Sig Start Date End Date Taking? Authorizing Provider  albuterol (PROVENTIL HFA;VENTOLIN HFA) 108 (90 Base) MCG/ACT inhaler Inhale 2 puffs into the lungs every 4 (four) hours as needed for wheezing or shortness of breath. q4-6 h PRN for shortness of breath   Yes Provider, Historical, Norman  amLODipine (NORVASC) 10 MG tablet TAKE 1 TABLET BY MOUTH  DAILY 12/07/17  Yes Heather Norman  Heather Norman, Norman  aspirin 81 MG tablet Take 1 tablet (81 mg total) by mouth daily. 06/28/17  Yes Heather Norman  carvedilol (COREG) 25 MG tablet Take 25 mg by mouth 2 (two) times daily with a meal.   Yes Provider, Historical, Norman  fluticasone (FLONASE) 50 MCG/ACT nasal spray Place 1 spray into both nostrils daily as needed for allergies or rhinitis.   Yes Provider, Historical, Norman  loratadine (CLARITIN) 10 MG tablet Take 10 mg by mouth daily. Takes daily   Yes Provider, Historical, Norman  metFORMIN (GLUCOPHAGE) 500 MG tablet Take 500 mg by mouth 2 (two) times daily with a meal.   Yes Provider, Historical, Norman  oxybutynin (DITROPAN) 5 MG tablet Take 5 mg by mouth  2 (two) times daily. 12/25/17  Yes Provider, Historical, Norman  potassium chloride 20 MEQ/15ML (10%) SOLN Take 45 mLs (60 mEq total) by mouth daily. 01/20/19  Yes Heather Norman  torsemide (DEMADEX) 20 MG tablet Take 2 tablets (40 mg total) by mouth 2 (two) times daily. 12/09/18  Yes Heather Norman  valsartan (DIOVAN) 160 MG tablet Take 2 tablets (320 mg total) by mouth daily. 12/26/18  Yes Heather Norman  spironolactone (ALDACTONE) 25 MG tablet Take 1 tablet (25 mg total) by mouth daily. 12/26/18 03/26/19  Heather Norman    Past Medical History:  Diagnosis Date  . (HFpEF) heart failure with preserved ejection fraction (HCC)    a. 04/2017 Echo: EF 55-60%, no rwma, Mild MR, mildly dil LA. Nl RV fxn.  . Arthritis    kness/hands - no meds  . CHF (congestive heart failure) (HCC)   . Diabetes mellitus without complication (HCC)   . Dyspnea on exertion   . Headache(784.0)    otc med prn  . Heart murmur   . Hyperlipidemia    no meds since pregnancy  . Hypertension   . Morbid obesity (HCC)    Past Surgical History:  Procedure Laterality Date  . CERVICAL CERCLAGE  06/01/2011   Procedure: CERCLAGE CERVICAL;  Surgeon: Heather Aspen, Heather Norman;  Location: [facility];  Service: Gynecology;  Laterality: N/A;  REQUESTING PORTABLE ULTARSOUND AT BEDSIDE PT'S EDC:12/02/2011  . CERVICAL CERCLAGE  06/01/2011   Procedure: CERCLAGE CERVICAL;  Surgeon: Heather Aspen, Heather Norman;  Location: [facility];  Service: Gynecology;  Laterality: N/A;  REQUESTING PORTABLE ULTARSOUND AT BEDSIDE PT'S EDC:12/02/2011  . endosocpy    . svd     x 2  . tab     x 1  . TUBAL LIGATION     Family History  Problem Relation Age of Onset  . Stroke Heather Norman   . Hypertension Heather Norman   . Hyperlipidemia Heather Norman   . Hypertension Heather Norman    Social History   Tobacco Use  . Smoking status: Never Smoker  . Smokeless tobacco: Never Used  Substance Use Topics  . Alcohol use: No   Allergies  Allergen Reactions  . Penicillins  Anaphylaxis    Has patient had a PCN reaction causing immediate rash, facial/tongue/throat swelling, SOB or lightheadedness with hypotension: Yes Has patient had a PCN reaction causing severe rash involving mucus membranes or skin necrosis: No Has patient had a PCN reaction that required hospitalization: No Has patient had a PCN reaction occurring within the last 10 years: No If all of the above answers are "NO", then may proceed with Cephalosporin use.   Prior to Admission medications   Medication Sig Start Date End Date Taking? Authorizing Provider  albuterol (PROVENTIL HFA;VENTOLIN HFA) 108 (90 Base) MCG/ACT inhaler Inhale 2 puffs into the lungs every 4 (four) hours as needed for wheezing or shortness of breath. q4-6 h PRN for shortness of breath   Yes Provider, Historical, Norman  amLODipine (NORVASC) 10 MG tablet TAKE 1 TABLET BY MOUTH  DAILY 12/07/17  Yes Gollan, Tollie Pizza, Norman  aspirin 81 MG tablet Take 1 tablet (81 mg total) by mouth daily. 06/28/17  Yes Heather Norman  carvedilol (COREG) 25 MG tablet Take 25 mg by mouth 2 (two) times daily with a meal.   Yes Provider, Historical, Norman  fluticasone (FLONASE) 50 MCG/ACT nasal spray Place 1 spray into both nostrils daily as needed for allergies or rhinitis.   Yes Provider, Historical, Norman  loratadine (CLARITIN) 10 MG tablet Take 10 mg by mouth daily. Takes daily   Yes Provider, Historical, Norman  metFORMIN (GLUCOPHAGE) 500 MG tablet Take 500 mg by mouth 2 (two) times daily with a meal.   Yes Provider, Historical, Norman  oxybutynin (DITROPAN) 5 MG tablet Take 5 mg by mouth 2 (two) times daily. 12/25/17  Yes Provider, Historical, Norman  potassium chloride 20 MEQ/15ML (10%) SOLN Take 45 mLs (60 mEq total) by mouth daily. 01/20/19  Yes Heather Norman  torsemide (DEMADEX) 20 MG tablet Take 2 tablets (40 mg total) by mouth 2 (two) times daily. 12/09/18  Yes Heather Norman  valsartan (DIOVAN) 160 MG tablet Take 2 tablets (320 mg total) by mouth  daily. 12/26/18  Yes Heather Norman  spironolactone (ALDACTONE) 25 MG tablet Take 1 tablet (25 mg total) by mouth daily. 12/26/18 03/26/19  Heather Norman    Review of Systems  Constitutional: Positive for fatigue. Negative for appetite change.  HENT: Negative for congestion, postnasal drip and sore throat.   Eyes: Negative.   Respiratory: Positive for shortness of breath (with exertion). Negative for cough, chest tightness and wheezing.   Cardiovascular: Negative for chest pain, palpitations and leg swelling.  Gastrointestinal: Positive for abdominal distention ("feel bloated"). Negative for abdominal pain and diarrhea.  Endocrine: Negative.   Genitourinary: Negative.   Musculoskeletal: Positive for arthralgias (right foot pain). Negative for back pain and neck pain.  Skin: Negative.   Allergic/Immunologic: Negative.   Neurological: Positive for numbness (hands ) and headaches. Negative for dizziness and light-headedness.  Hematological: Negative for adenopathy. Does not bruise/bleed easily.  Psychiatric/Behavioral: Positive for sleep disturbance (sleeping on 3 pillows). Negative for dysphoric mood. The patient is not nervous/anxious.    Vitals:   05/12/19 0841  BP: (!) 148/96  Pulse: 79  Resp: 20  Temp: 98.6 F (37 C)  SpO2: 98%   Filed Weights   05/12/19 0841  Weight: 282 lb 9.6 oz (128.2 kg)   Lab Results  Component Value Date   CREATININE 0.85 12/16/2018   CREATININE 0.84 04/02/2018   CREATININE 0.85 03/25/2018     Physical Exam  Constitutional: She is oriented to person, place, and time. She appears well-developed and well-nourished.  HENT:  Head: Normocephalic and atraumatic.  Neck: Normal range of motion. Neck supple. No JVD present.  Cardiovascular: Normal rate and regular rhythm.  Murmur heard. Pulmonary/Chest: Effort normal. She has no wheezes. She has no rales.  Abdominal: Soft. She exhibits no distension. There is no abdominal tenderness.   Musculoskeletal:        General: No tenderness or edema.  Neurological: She is alert and oriented to person, place, and time.  Skin:  Skin is warm and dry.  Psychiatric: She has a normal mood and affect. Her behavior is normal. Thought content normal.  Nursing note and vitals reviewed.   Assessment & Plan:  1: Chronic heart failure with preserved ejection fraction- - NYHA class II - euvolemic today - weighing daily; reminded to call for an overnight weight gain of >2 pounds or a weekly weight gain of >5 pounds - weight down 9 pounds from last visit 4 months ago - not adding salt to her food and is trying to follow a low sodium diet - encouraged her to be as active as possible - had telemedicine visit with cardiology Mariah Milling(Gollan) 12/09/2018 - BNP 12/25/17 was 28.0  2: HTN- - BP mildly elevated today  - sees PCP [facility])  - BMP from 12/16/2018 reviewed and shows sodium 138, potassium 3.2, creatinine 0.85 and GFR >60 - she was changed to liquid potassium chloride and is supposed to take 45ml daily (60 mEq). She states that she has only been taking 36ml which equals 48 mEq potassium daily.  - recheck BMP today  3: Diabetes-  - not checking her glucose at home - last A1c was 6.6% in 2014  4: Snoring- - She snores every night and sometimes wakes up gasping - She is as tired when she wakes up as when she went to bed - Will order sleep study   Patient did not bring her medications nor a list. Each medication was verbally reviewed with the patient and she was encouraged to bring the bottles to every visit to confirm accuracy of list.  Return in 4 weeks or sooner for any questions/problems before then.

## 2019-05-12 NOTE — Telephone Encounter (Signed)
Called patient with results of BMP. Potassium is 2.8. Instructed her to take 8ml (29mEq) of potassium chloride twice a day for 3 days and then to take 64ml (75mEq) daily after that. Reminded her of how to take full dose with her 82ml syringe. Will recheck BMP in 1 week on 05/19/19 at 0830. Patient also asked about her glucose. Informed her that it was 131 and was fasting which is slightly elevated and to follow up with her PCP. Patient verbalized understanding.

## 2019-05-12 NOTE — Patient Instructions (Addendum)
Continue weighing daily and call for an overnight weight gain of >2 pounds or a weekly weight gain of >5 pounds.  Check BMP today (potassium and kidneys)  We will call in referral for sleep study

## 2019-05-15 ENCOUNTER — Other Ambulatory Visit: Payer: Self-pay | Admitting: Family

## 2019-05-20 ENCOUNTER — Encounter
Admission: RE | Admit: 2019-05-20 | Discharge: 2019-05-20 | Disposition: A | Discharge status: Home / Self Care | Payer: Managed Care, Other (non HMO) | Attending: Family | Admitting: Family

## 2019-05-20 DIAGNOSIS — Z01812 Encounter for preprocedural laboratory examination: Secondary | ICD-10-CM | POA: Insufficient documentation

## 2019-05-20 LAB — BASIC METABOLIC PANEL
Anion gap: 10 (ref 5–15)
BUN: 16 mg/dL (ref 6–20)
CO2: 27 mmol/L (ref 22–32)
Calcium: 8.6 mg/dL — ABNORMAL LOW (ref 8.9–10.3)
Chloride: 103 mmol/L (ref 98–111)
Creatinine, Ser: 1.06 mg/dL — ABNORMAL HIGH (ref 0.44–1.00)
GFR calc Af Amer: >60 mL/min (ref >60)
GFR calc non Af Amer: >60 mL/min (ref >60)
Glucose, Bld: 90 mg/dL (ref 70–99)
Potassium: 3.4 mmol/L — ABNORMAL LOW (ref 3.5–5.1)
Sodium: 140 mmol/L (ref 135–145)

## 2019-06-06 NOTE — Progress Notes (Signed)
Patient ID: Heather Norman, female    DOB: 1976/02/03, 43 y.o.   MRN: [id number]  HPI  Heather Norman is a 42 y/o female with a history of arthritis, DM, hyperlipidemia, HTN, morbid obesity and chronic heart failure.  Echo report from 05/16/17 reviewed and showed an EF of 55-60% along with mild MR. Reviewed echo report from 05/15/14 which showed an EF of 60-65% along with mild MR and normal PA pressure.  Has not been in the ED or admitted in the last 6 months.   She presents today for her follow-up visit with a chief complaint of minimal shortness of breath upon moderate exertion. She describes this as being chronic in nature having been present for several years. She has associated fatigue, headaches and chronic difficulty sleeping along with this. She denies any dizziness, abdominal distention, palpitations, pedal edema, chest pain, wheezing, cough or weight gain. Overall she says that this is the best she has felt in "awhile".     Past Medical History:  Diagnosis Date  . (HFpEF) heart failure with preserved ejection fraction (HCC)    a. 04/2017 Echo: EF 55-60%, no rwma, Mild MR, mildly dil LA. Nl RV fxn.  . Arthritis    kness/hands - no meds  . CHF (congestive heart failure) (HCC)   . Diabetes mellitus without complication (HCC)   . Dyspnea on exertion   . Headache(784.0)    otc med prn  . Heart murmur   . Hyperlipidemia    no meds since pregnancy  . Hypertension   . Morbid obesity (HCC)    Past Surgical History:  Procedure Laterality Date  . CERVICAL CERCLAGE  06/01/2011   Procedure: CERCLAGE CERVICAL;  Surgeon: Philip Aspen, DO;  Location: [facility];  Service: Gynecology;  Laterality: N/A;  REQUESTING PORTABLE ULTARSOUND AT BEDSIDE PT'S EDC:12/02/2011  . CERVICAL CERCLAGE  06/01/2011   Procedure: CERCLAGE CERVICAL;  Surgeon: Philip Aspen, DO;  Location: [facility];  Service: Gynecology;  Laterality: N/A;  REQUESTING PORTABLE ULTARSOUND AT BEDSIDE PT'S EDC:12/02/2011  . endosocpy     . svd     x 2  . tab     x 1  . TUBAL LIGATION     Family History  Problem Relation Age of Onset  . Stroke Mother   . Hypertension Mother   . Hyperlipidemia Mother   . Hypertension Father    Social History   Tobacco Use  . Smoking status: Never Smoker  . Smokeless tobacco: Never Used  Substance Use Topics  . Alcohol use: No   Prior to Admission medications   Medication Sig Start Date End Date Taking? Authorizing Provider  albuterol (PROVENTIL HFA;VENTOLIN HFA) 108 (90 Base) MCG/ACT inhaler Inhale 2 puffs into the lungs every 4 (four) hours as needed for wheezing or shortness of breath. q4-6 h PRN for shortness of breath   Yes [provider]  amLODipine (NORVASC) 10 MG tablet TAKE 1 TABLET BY MOUTH  DAILY 12/07/17  Yes Gollan, Tollie Pizza, MD  aspirin 81 MG tablet Take 1 tablet (81 mg total) by mouth daily. 06/28/17  Yes [name], MD  carvedilol (COREG) 25 MG tablet Take 25 mg by mouth 2 (two) times daily with a meal.   Yes [provider]  fluticasone (FLONASE) 50 MCG/ACT nasal spray Place 1 spray into both nostrils daily as needed for allergies or rhinitis.   Yes [provider]  loratadine (CLARITIN) 10 MG tablet Take 10 mg by mouth daily. Takes daily  Yes [provider]  metFORMIN (GLUCOPHAGE) 500 MG tablet Take 500 mg by mouth 2 (two) times daily with a meal.   Yes [provider]  oxybutynin (DITROPAN) 5 MG tablet Take 5 mg by mouth 2 (two) times daily. 12/25/17  Yes [provider]  potassium chloride 20 MEQ/15ML (10%) SOLN Take 45 mLs (60 mEq total) by mouth daily. 01/20/19  Yes [name], MD  spironolactone (ALDACTONE) 25 MG tablet Take 1 tablet (25 mg total) by mouth daily. 12/26/18 06/09/19 Yes Gollan, Tollie Pizza, MD  torsemide (DEMADEX) 20 MG tablet Take 2 tablets (40 mg total) by mouth 2 (two) times daily. 12/09/18  Yes [name], MD  valsartan (DIOVAN) 160 MG tablet Take 2 tablets (320 mg total)  by mouth daily. 12/26/18  Yes [name], MD     Review of Systems  Constitutional: Positive for fatigue. Negative for appetite change.  HENT: Negative for congestion, postnasal drip and sore throat.   Eyes: Negative.   Respiratory: Positive for shortness of breath (with moderate exertion). Negative for cough, chest tightness and wheezing.   Cardiovascular: Negative for chest pain, palpitations and leg swelling.  Gastrointestinal: Positive for diarrhea. Negative for abdominal distention and abdominal pain.  Endocrine: Negative.   Genitourinary: Negative.   Musculoskeletal: Negative for back pain and neck pain.  Skin: Negative.   Allergic/Immunologic: Negative.   Neurological: Positive for numbness (hands ) and headaches. Negative for dizziness and light-headedness.  Hematological: Negative for adenopathy. Does not bruise/bleed easily.  Psychiatric/Behavioral: Positive for sleep disturbance (sleeping on 3 pillows). Negative for dysphoric mood. The patient is not nervous/anxious.     Vitals:   06/09/19 0857  BP: (!) 144/78  Pulse: 72  Resp: 18  SpO2: 98%  Weight: 283 lb (128.4 kg)  Height: 5\' 5"  (1.651 m)   Wt Readings from Last 3 Encounters:  06/09/19 283 lb (128.4 kg)  05/12/19 282 lb 9.6 oz (128.2 kg)  01/01/19 291 lb 8 oz (132.2 kg)   Lab Results  Component Value Date   CREATININE 1.06 (H) 05/20/2019   CREATININE 1.04 (H) 05/12/2019   CREATININE 0.85 12/16/2018     Physical Exam  Constitutional: She is oriented to person, place, and time. She appears well-developed and well-nourished.  HENT:  Head: Normocephalic and atraumatic.  Neck: Normal range of motion. Neck supple. No JVD present.  Cardiovascular: Normal rate and regular rhythm.  Murmur heard. Pulmonary/Chest: Effort normal. She has no wheezes. She has no rales.  Abdominal: Soft. She exhibits no distension. There is no abdominal tenderness.  Musculoskeletal:        General: No tenderness or edema.   Neurological: She is alert and oriented to person, place, and time.  Skin: Skin is warm and dry.  Psychiatric: She has a normal mood and affect. Her behavior is normal. Thought content normal.  Nursing note and vitals reviewed.   Assessment & Plan:  1: Chronic heart failure with preserved ejection fraction- - NYHA class II - euvolemic today - last echo was done Oct 2018 so this was scheduled for 06/16/2019 - weighing daily; reminded to call for an overnight weight gain of >2 pounds or a weekly weight gain of >5 pounds - weight stable from last visit 1 month ago - not adding salt to her food and is trying to follow a low sodium diet - encouraged her to be as active as possible - had telemedicine visit with cardiology 06/18/2019) 12/09/2018 - BNP 12/25/17 was 28.0 -  has received her flu vaccine for this season  2: HTN- - BP mildly elevated today (144/78) - sees PCP [facility] & has f/u appointment scheduled for the end of this month - BMP from 05/20/2019 reviewed and shows sodium 140, potassium 3.4, creatinine 1.06 and GFR >60  3: Diabetes-  - not checking her glucose at home - nonfasting glucose in clinic today was 100 - last A1c was 6.6% in 2014  4: Snoring- - She snores every night and sometimes wakes up gasping - She is as tired when she wakes up as when she went to bed - Patient says that her sleep study is scheduled for 06/20/2019   Patient did not bring her medications nor a list. Each medication was verbally reviewed with the patient and she was encouraged to bring the bottles to every visit to confirm accuracy of list.  Return in 6 months or sooner for any questions/problems before then.

## 2019-06-09 ENCOUNTER — Other Ambulatory Visit: Payer: Self-pay

## 2019-06-09 ENCOUNTER — Ambulatory Visit: Payer: Managed Care, Other (non HMO) | Attending: Family | Admitting: Family

## 2019-06-09 ENCOUNTER — Encounter: Payer: Self-pay | Admitting: Family

## 2019-06-09 VITALS — BP 144/78 | HR 72 | Resp 18 | Ht 65.0 in | Wt 283.0 lb

## 2019-06-09 DIAGNOSIS — I110 Hypertensive heart disease with heart failure: Secondary | ICD-10-CM | POA: Diagnosis not present

## 2019-06-09 DIAGNOSIS — Z7984 Long term (current) use of oral hypoglycemic drugs: Secondary | ICD-10-CM | POA: Insufficient documentation

## 2019-06-09 DIAGNOSIS — I10 Essential (primary) hypertension: Secondary | ICD-10-CM

## 2019-06-09 DIAGNOSIS — Z823 Family history of stroke: Secondary | ICD-10-CM | POA: Insufficient documentation

## 2019-06-09 DIAGNOSIS — Z6841 Body Mass Index (BMI) 40.0 and over, adult: Secondary | ICD-10-CM | POA: Diagnosis not present

## 2019-06-09 DIAGNOSIS — Z7982 Long term (current) use of aspirin: Secondary | ICD-10-CM | POA: Insufficient documentation

## 2019-06-09 DIAGNOSIS — Z8249 Family history of ischemic heart disease and other diseases of the circulatory system: Secondary | ICD-10-CM | POA: Diagnosis not present

## 2019-06-09 DIAGNOSIS — E119 Type 2 diabetes mellitus without complications: Secondary | ICD-10-CM | POA: Diagnosis present

## 2019-06-09 DIAGNOSIS — I5032 Chronic diastolic (congestive) heart failure: Secondary | ICD-10-CM | POA: Insufficient documentation

## 2019-06-09 DIAGNOSIS — M1990 Unspecified osteoarthritis, unspecified site: Secondary | ICD-10-CM | POA: Diagnosis not present

## 2019-06-09 DIAGNOSIS — R0683 Snoring: Secondary | ICD-10-CM | POA: Diagnosis not present

## 2019-06-09 DIAGNOSIS — Z79899 Other long term (current) drug therapy: Secondary | ICD-10-CM | POA: Diagnosis not present

## 2019-06-09 LAB — GLUCOSE, CAPILLARY: Glucose-Capillary: 100 mg/dL — ABNORMAL HIGH (ref 70–99)

## 2019-06-09 NOTE — Patient Instructions (Signed)
Continue weighing daily and call for an overnight weight gain of > 2 pounds or a weekly weight gain of >5 pounds. 

## 2019-06-16 ENCOUNTER — Ambulatory Visit
Admission: RE | Admit: 2019-06-16 | Discharge: 2019-06-16 | Disposition: A | Discharge status: Home / Self Care | Payer: Managed Care, Other (non HMO) | Source: Ambulatory Visit | Attending: Family | Admitting: Family

## 2019-06-16 ENCOUNTER — Other Ambulatory Visit: Payer: Self-pay

## 2019-06-16 DIAGNOSIS — E119 Type 2 diabetes mellitus without complications: Secondary | ICD-10-CM | POA: Insufficient documentation

## 2019-06-16 DIAGNOSIS — I110 Hypertensive heart disease with heart failure: Secondary | ICD-10-CM | POA: Insufficient documentation

## 2019-06-16 DIAGNOSIS — I5032 Chronic diastolic (congestive) heart failure: Secondary | ICD-10-CM | POA: Insufficient documentation

## 2019-06-16 DIAGNOSIS — R0609 Other forms of dyspnea: Secondary | ICD-10-CM | POA: Insufficient documentation

## 2019-06-16 DIAGNOSIS — R011 Cardiac murmur, unspecified: Secondary | ICD-10-CM | POA: Insufficient documentation

## 2019-06-16 NOTE — Progress Notes (Signed)
*  PRELIMINARY RESULTS* Echocardiogram 2D Echocardiogram has been performed.  Heather Norman 06/16/2019, 10:48 AM

## 2019-10-16 ENCOUNTER — Encounter: Payer: Self-pay | Admitting: Emergency Medicine

## 2019-10-16 ENCOUNTER — Emergency Department
Admission: EM | Admit: 2019-10-16 | Discharge: 2019-10-16 | Disposition: A | Discharge status: Home / Self Care | Payer: Managed Care, Other (non HMO) | Attending: Emergency Medicine | Admitting: Emergency Medicine

## 2019-10-16 ENCOUNTER — Emergency Department: Payer: Managed Care, Other (non HMO)

## 2019-10-16 ENCOUNTER — Other Ambulatory Visit: Payer: Self-pay

## 2019-10-16 DIAGNOSIS — R0789 Other chest pain: Secondary | ICD-10-CM | POA: Diagnosis not present

## 2019-10-16 DIAGNOSIS — Z7984 Long term (current) use of oral hypoglycemic drugs: Secondary | ICD-10-CM | POA: Insufficient documentation

## 2019-10-16 DIAGNOSIS — R0602 Shortness of breath: Secondary | ICD-10-CM | POA: Diagnosis present

## 2019-10-16 DIAGNOSIS — I5032 Chronic diastolic (congestive) heart failure: Secondary | ICD-10-CM | POA: Insufficient documentation

## 2019-10-16 DIAGNOSIS — Z79899 Other long term (current) drug therapy: Secondary | ICD-10-CM | POA: Insufficient documentation

## 2019-10-16 DIAGNOSIS — I110 Hypertensive heart disease with heart failure: Secondary | ICD-10-CM | POA: Diagnosis not present

## 2019-10-16 DIAGNOSIS — E119 Type 2 diabetes mellitus without complications: Secondary | ICD-10-CM | POA: Insufficient documentation

## 2019-10-16 DIAGNOSIS — I5033 Acute on chronic diastolic (congestive) heart failure: Secondary | ICD-10-CM

## 2019-10-16 LAB — COMPREHENSIVE METABOLIC PANEL
ALT: 19 U/L (ref 0–44)
AST: 18 U/L (ref 15–41)
Albumin: 3.6 g/dL (ref 3.5–5.0)
Alkaline Phosphatase: 85 U/L (ref 38–126)
Anion gap: 11 (ref 5–15)
BUN: 17 mg/dL (ref 6–20)
CO2: 28 mmol/L (ref 22–32)
Calcium: 8.1 mg/dL — ABNORMAL LOW (ref 8.9–10.3)
Chloride: 100 mmol/L (ref 98–111)
Creatinine, Ser: 1.11 mg/dL — ABNORMAL HIGH (ref 0.44–1.00)
GFR calc Af Amer: >60 mL/min (ref >60)
GFR calc non Af Amer: 60 mL/min — ABNORMAL LOW (ref >60)
Glucose, Bld: 141 mg/dL — ABNORMAL HIGH (ref 70–99)
Potassium: 3.2 mmol/L — ABNORMAL LOW (ref 3.5–5.1)
Sodium: 139 mmol/L (ref 135–145)
Total Bilirubin: 0.6 mg/dL (ref 0.3–1.2)
Total Protein: 7.7 g/dL (ref 6.5–8.1)

## 2019-10-16 LAB — BRAIN NATRIURETIC PEPTIDE: B Natriuretic Peptide: 30 pg/mL (ref 0.0–100.0)

## 2019-10-16 LAB — TROPONIN I (HIGH SENSITIVITY)
Troponin I (High Sensitivity): 7 ng/L (ref <18)
Troponin I (High Sensitivity): 6 ng/L (ref <18)

## 2019-10-16 LAB — CBC
HCT: 39.9 % (ref 36.0–46.0)
Hemoglobin: 12.7 g/dL (ref 12.0–15.0)
MCH: 26.8 pg (ref 26.0–34.0)
MCHC: 31.8 g/dL (ref 30.0–36.0)
MCV: 84.4 fL (ref 80.0–100.0)
Platelets: 355 10*3/uL (ref 150–400)
RBC: 4.73 MIL/uL (ref 3.87–5.11)
RDW: 15.5 % (ref 11.5–15.5)
WBC: 8.8 10*3/uL (ref 4.0–10.5)
nRBC: 0 % (ref 0.0–0.2)

## 2019-10-16 MED ORDER — SODIUM CHLORIDE 0.9% FLUSH
3.0000 mL | Freq: Once | INTRAVENOUS | Status: AC
  Administered 2019-10-16: 3 mL via INTRAVENOUS

## 2019-10-16 MED ORDER — ALBUTEROL SULFATE (2.5 MG/3ML) 0.083% IN NEBU: 5.0000 mg | INHALATION_SOLUTION | Freq: Once | RESPIRATORY_TRACT | Status: DC

## 2019-10-16 MED ORDER — FUROSEMIDE 10 MG/ML IJ SOLN
40.0000 mg | Freq: Once | INTRAMUSCULAR | Status: AC
  Administered 2019-10-16: 40 mg via INTRAVENOUS
  Filled 2019-10-16: qty 4

## 2019-10-16 MED ORDER — POTASSIUM CHLORIDE 20 MEQ/15ML (10%) PO SOLN
40.0000 meq | Freq: Once | ORAL | Status: AC
  Administered 2019-10-16: 40 meq via ORAL
  Filled 2019-10-16: qty 30

## 2019-10-16 NOTE — Discharge Instructions (Addendum)
Your chest x-ray was concerning for some pulmonary edema.  We gave you an extra dose of IV Lasix today.  Continue to take your torsemide and follow-up with your heart failure team.  You may need to have your kidney function rechecked.  Your creatinine was 1.1.  Return to the ER for worsening shortness of breath unilateral leg swelling or any other concerns

## 2019-10-16 NOTE — ED Notes (Signed)
Pt pulled to triage 2 for blood work by [employer], EDT.

## 2019-10-16 NOTE — ED Notes (Signed)
Pt ambulated in hall with steady gait, O2 saturation 98% during ambulation

## 2019-10-16 NOTE — ED Notes (Signed)
Pt speaking with this RN in NAD, speaking in complete sentences with no difficulty. Reports continued tightening in chest intermittently that started this morning

## 2019-10-16 NOTE — ED Triage Notes (Signed)
Pt in via EMS from work with c/o SOB. EMS reports pt was on a NRB mask upon their arrival. EMS reports they had no baseline sats. 100% RA, HR 76, 134/89. Hx of HTN and is being seen for possible CHF, lungs sounds clear. FSBS 209.

## 2019-10-16 NOTE — ED Provider Notes (Addendum)
[facility] Emergency Department Provider Note  ____________________________________________   First MD Initiated Contact with Patient 10/16/19 1340     (approximate)  I have reviewed the triage vital signs and the nursing notes.   HISTORY  Chief Complaint Respiratory Distress    HPI CHEROLYN BEHRLE is a 44 y.o. female with some diastolic heart dysfunction on torsemide, diabetes mellitus, hypertension, hyperlipidemia who comes in for shortness of breath.  Patient states around 745 she was at the grocery store when she ate something and then she started having some shortness of breath and chest discomfort.  She tried to go to work but while at work she had worsening shortness of breath.  She describes the chest discomfort as moderate, constant, easing up on its own, nothing made it worse.  Patient states that she is had this previously due to CHF exacerbations.  Patient states that she is been compliant with her torsemide.  She denies history of PE, recent travel, recent surgery, unilateral leg swelling, estrogen use or other risk factors for PE.  Patient is currently on torsemide 40 twice daily.  No fevers, coughs.            Past Medical History:  Diagnosis Date  . (HFpEF) heart failure with preserved ejection fraction (HCC)    a. 04/2017 Echo: EF 55-60%, no rwma, Mild MR, mildly dil LA. Nl RV fxn.  . Arthritis    kness/hands - no meds  . CHF (congestive heart failure) (HCC)   . Diabetes mellitus without complication (HCC)   . Dyspnea on exertion   . Headache(784.0)    otc med prn  . Heart murmur   . Hyperlipidemia    no meds since pregnancy  . Hypertension   . Morbid obesity [facility])     Patient Active Problem List   Diagnosis Date Noted  . Type 2 diabetes mellitus without complication, without long-term current use of insulin (HCC) 05/19/2018  . DM (diabetes mellitus) (HCC) 05/11/2017  . Snoring 05/11/2017  . Morbid obesity (HCC) 08/30/2015   . Chronic diastolic CHF (congestive heart failure) (HCC) 08/30/2015  . Essential hypertension 02/16/2015  . Elderly multigravida with antepartum condition or complication 05/22/2011    Past Surgical History:  Procedure Laterality Date  . CERVICAL CERCLAGE  06/01/2011   Procedure: CERCLAGE CERVICAL;  Surgeon: Philip Aspen, DO;  Location: [facility];  Service: Gynecology;  Laterality: N/A;  REQUESTING PORTABLE ULTARSOUND AT BEDSIDE PT'S EDC:12/02/2011  . CERVICAL CERCLAGE  06/01/2011   Procedure: CERCLAGE CERVICAL;  Surgeon: Philip Aspen, DO;  Location: [facility];  Service: Gynecology;  Laterality: N/A;  REQUESTING PORTABLE ULTARSOUND AT BEDSIDE PT'S EDC:12/02/2011  . endosocpy    . svd     x 2  . tab     x 1  . TUBAL LIGATION      Prior to Admission medications   Medication Sig Start Date End Date Taking? Authorizing Provider  albuterol (PROVENTIL HFA;VENTOLIN HFA) 108 (90 Base) MCG/ACT inhaler Inhale 2 puffs into the lungs every 4 (four) hours as needed for wheezing or shortness of breath. q4-6 h PRN for shortness of breath    [provider]  amLODipine (NORVASC) 10 MG tablet TAKE 1 TABLET BY MOUTH  DAILY 12/07/17   [name], MD  aspirin 81 MG tablet Take 1 tablet (81 mg total) by mouth daily. 06/28/17   [name], MD  carvedilol (COREG) 25 MG tablet Take 25 mg by mouth 2 (two) times daily  with a meal.    [provider]  fluticasone (FLONASE) 50 MCG/ACT nasal spray Place 1 spray into both nostrils daily as needed for allergies or rhinitis.    [provider]  loratadine (CLARITIN) 10 MG tablet Take 10 mg by mouth daily. Takes daily    [provider]  metFORMIN (GLUCOPHAGE) 500 MG tablet Take 500 mg by mouth 2 (two) times daily with a meal.    [provider]  oxybutynin (DITROPAN) 5 MG tablet Take 5 mg by mouth 2 (two) times daily. 12/25/17   [provider]  potassium chloride 20 MEQ/15ML (10%) SOLN Take 45 mLs (60  mEq total) by mouth daily. 01/20/19   Minna Merritts, MD  spironolactone (ALDACTONE) 25 MG tablet Take 1 tablet (25 mg total) by mouth daily. 12/26/18 06/09/19  Minna Merritts, MD  torsemide (DEMADEX) 20 MG tablet Take 2 tablets (40 mg total) by mouth 2 (two) times daily. 12/09/18   Minna Merritts, MD  valsartan (DIOVAN) 160 MG tablet Take 2 tablets (320 mg total) by mouth daily. 12/26/18   Minna Merritts, MD    Allergies Penicillins  Family History  Problem Relation Age of Onset  . Stroke Mother   . Hypertension Mother   . Hyperlipidemia Mother   . Hypertension Father     Social History Social History   Tobacco Use  . Smoking status: Never Smoker  . Smokeless tobacco: Never Used  Substance Use Topics  . Alcohol use: No  . Drug use: No      Review of Systems Constitutional: No fever/chills Eyes: No visual changes. ENT: No sore throat. Cardiovascular: Positive chest pain  respiratory: Positive shortness of breath Gastrointestinal: No abdominal pain.  No nausea, no vomiting.  No diarrhea.  No constipation. Genitourinary: Negative for dysuria. Musculoskeletal: Negative for back pain. Skin: Negative for rash. Neurological: Negative for headaches, focal weakness or numbness. All other ROS negative ____________________________________________   PHYSICAL EXAM:  VITAL SIGNS: ED Triage Vitals  Enc Vitals Group     BP 10/16/19 1033 134/70     Pulse Rate 10/16/19 1033 68     Resp 10/16/19 1033 18     Temp 10/16/19 1033 98.6 F (37 C)     Temp Source 10/16/19 1033 Oral     SpO2 10/16/19 1033 97 %     Weight 10/16/19 1035 290 lb (131.5 kg)     Height 10/16/19 1035 5\' 6"  (1.676 m)     Head Circumference --      Peak Flow --      Pain Score 10/16/19 1109 0     Pain Loc --      Pain Edu? --      Excl. in [place]? --     Constitutional: Alert and oriented. Well appearing and in no acute distress. Eyes: Conjunctivae are normal. EOMI. Head: Atraumatic. Nose: No  congestion/rhinnorhea. Mouth/Throat: Mucous membranes are moist.   Neck: No stridor. Trachea Midline. FROM Cardiovascular: Normal rate, regular rhythm. Grossly normal heart sounds.  Good peripheral circulation. Respiratory: Normal respiratory effort.  No retractions. Lungs CTAB. Gastrointestinal: Soft and nontender. No distention. No abdominal bruits.  Musculoskeletal: No lower extremity tenderness nor edema.  No joint effusions. Neurologic:  Normal speech and language. No gross focal neurologic deficits are appreciated.  Skin:  Skin is warm, dry and intact. No rash noted. Psychiatric: Mood and affect are normal. Speech and behavior are normal. GU: Deferred   ____________________________________________   LABS (all  labs ordered are listed, but only abnormal results are displayed)  Labs Reviewed  COMPREHENSIVE METABOLIC PANEL - Abnormal; Notable for the following components:      Result Value   Potassium 3.2 (*)    Glucose, Bld 141 (*)    Creatinine, Ser 1.11 (*)    Calcium 8.1 (*)    GFR calc non Af Amer 60 (*)    All other components within normal limits  CBC  BRAIN NATRIURETIC PEPTIDE  TROPONIN I (HIGH SENSITIVITY)  TROPONIN I (HIGH SENSITIVITY)   ____________________________________________   ED ECG REPORT I, Concha Se, the attending physician, personally viewed and interpreted this ECG.  EKG is normal sinus rate of 65, no ST elevation, T wave inversions in 1, aVL, V6, normal intervals.  I did review prior EKGs and she is had similar T wave inversions. ____________________________________________  RADIOLOGY Vela Prose, personally viewed and evaluated these images (plain radiographs) as part of my medical decision making, as well as reviewing the written report by the radiologist.  ED MD interpretation: Mild interstitial edema bilaterally  Official radiology report(s): DG Chest 2 View  Result Date: 10/16/2019 CLINICAL DATA:  Shortness of breath EXAM: CHEST -  2 VIEW COMPARISON:  None. FINDINGS: The heart size and mediastinal contours are borderline enlarged, stable. Mild vascular congestion and subtle interstitial prominence. No focal airspace consolidation, pleural effusion, or pneumothorax. The visualized skeletal structures are unremarkable. IMPRESSION: Findings of pulmonary vascular congestion and mild interstitial edema. Electronically Signed   By: Duanne Guess D.O.   On: 10/16/2019 11:51    ____________________________________________   PROCEDURES  Procedure(s) performed (including Critical Care):  Procedures   ____________________________________________   INITIAL IMPRESSION / ASSESSMENT AND PLAN / ED COURSE  ELENI FRANK was evaluated in Emergency Department on 10/16/2019 for the symptoms described in the history of present illness. She was evaluated in the context of the global COVID-19 pandemic, which necessitated consideration that the patient might be at risk for infection with the SARS-CoV-2 virus that causes COVID-19. Institutional protocols and algorithms that pertain to the evaluation of patients at risk for COVID-19 are in a state of rapid change based on information released by regulatory bodies including the CDC and federal and state organizations. These policies and algorithms were followed during the patient's care in the ED.    Patient is a 44 year old with known CHF on torsemide who comes in with shortness of breath and chest pain that was similar to prior CHF exacerbations.  Patient now is feeling better and is satting normally and in no respiratory distress.  Patient is talking in full sentences.  Will get chest x-ray to evaluate for pleural effusion, pneumonia, edema.  Patient is PERC negative and I have low suspicion for PE at this time.  Will get cardiac markers x2 to evaluate for ACS.  Denies any risk factors for Covid.  Patient was able to ambulate around the room with sats of 98%.  Patient given some repletion  of potassium 40 mEq.  Patient's repeat cardiac marker was negative x2.  Chest x-ray concerning for some pulmonary edema.  No evidence of anemia.  We will give a dose of IV Lasix due to some mild interstitial edema on her chest x-ray.  Discussed with patient she can follow-up with her heart failure team.  Patient feels comfortable with this plan and feels comfortable being discharged home  I discussed the provisional nature of ED diagnosis, the treatment so far, the ongoing plan of care,  follow up appointments and return precautions with the patient and any family or support people present. They expressed understanding and agreed with the plan, discharged home.          ____________________________________________   FINAL CLINICAL IMPRESSION(S) / ED DIAGNOSES   Final diagnoses:  Acute on chronic diastolic heart failure (HCC)  Shortness of breath      MEDICATIONS GIVEN DURING THIS VISIT:  Medications  albuterol (PROVENTIL) (2.5 MG/3ML) 0.083% nebulizer solution 5 mg (has no administration in time range)  sodium chloride flush (NS) 0.9 % injection 3 mL (has no administration in time range)  potassium chloride 20 MEQ/15ML (10%) solution 40 mEq (has no administration in time range)  furosemide (LASIX) injection 40 mg (has no administration in time range)     ED Discharge Orders    None       Note:  This document was prepared using Dragon voice recognition software and may include unintentional dictation errors.   Concha Se, MD 10/16/19 1453    Concha Se, MD 10/16/19 [phone]

## 2019-10-16 NOTE — ED Notes (Signed)
Report given to Rachel, RN.

## 2019-10-18 ENCOUNTER — Ambulatory Visit: Payer: Managed Care, Other (non HMO) | Attending: Internal Medicine

## 2019-10-18 DIAGNOSIS — Z23 Encounter for immunization: Secondary | ICD-10-CM

## 2019-10-18 NOTE — Progress Notes (Signed)
   Covid-19 Vaccination Clinic  Name:  Heather Norman    MRN: [id number] DOB: May 03, 1976  10/18/2019  [name] was observed post Covid-19 immunization for 15 minutes without incident. She was provided with Vaccine Information Sheet and instruction to access the V-Safe system.   [name] was instructed to call 911 with any severe reactions post vaccine: Marland Kitchen Difficulty breathing  . Swelling of face and throat  . A fast heartbeat  . A bad rash all over body  . Dizziness and weakness   Immunizations Administered    Name Date Dose VIS Date Route   Pfizer COVID-19 Vaccine 10/18/2019 10:36 AM 0.3 mL 07/11/2019 Intramuscular   Manufacturer: [employer], [employer]   Lot: [id number]   NDC: [address]-8

## 2019-10-24 ENCOUNTER — Encounter: Payer: Self-pay | Admitting: Family

## 2019-10-24 ENCOUNTER — Other Ambulatory Visit: Payer: Self-pay

## 2019-10-24 ENCOUNTER — Ambulatory Visit: Payer: Managed Care, Other (non HMO) | Attending: Family | Admitting: Family

## 2019-10-24 VITALS — BP 129/64 | HR 59 | Resp 20 | Ht 66.0 in | Wt 289.1 lb

## 2019-10-24 DIAGNOSIS — E785 Hyperlipidemia, unspecified: Secondary | ICD-10-CM | POA: Insufficient documentation

## 2019-10-24 DIAGNOSIS — E876 Hypokalemia: Secondary | ICD-10-CM | POA: Diagnosis not present

## 2019-10-24 DIAGNOSIS — Z8249 Family history of ischemic heart disease and other diseases of the circulatory system: Secondary | ICD-10-CM | POA: Diagnosis not present

## 2019-10-24 DIAGNOSIS — Z79899 Other long term (current) drug therapy: Secondary | ICD-10-CM | POA: Diagnosis not present

## 2019-10-24 DIAGNOSIS — M1990 Unspecified osteoarthritis, unspecified site: Secondary | ICD-10-CM | POA: Diagnosis not present

## 2019-10-24 DIAGNOSIS — Z7982 Long term (current) use of aspirin: Secondary | ICD-10-CM | POA: Diagnosis not present

## 2019-10-24 DIAGNOSIS — Z7901 Long term (current) use of anticoagulants: Secondary | ICD-10-CM | POA: Insufficient documentation

## 2019-10-24 DIAGNOSIS — R5383 Other fatigue: Secondary | ICD-10-CM | POA: Diagnosis not present

## 2019-10-24 DIAGNOSIS — R0602 Shortness of breath: Secondary | ICD-10-CM | POA: Insufficient documentation

## 2019-10-24 DIAGNOSIS — R682 Dry mouth, unspecified: Secondary | ICD-10-CM | POA: Diagnosis not present

## 2019-10-24 DIAGNOSIS — E119 Type 2 diabetes mellitus without complications: Secondary | ICD-10-CM | POA: Diagnosis not present

## 2019-10-24 DIAGNOSIS — R200 Anesthesia of skin: Secondary | ICD-10-CM | POA: Diagnosis not present

## 2019-10-24 DIAGNOSIS — I5032 Chronic diastolic (congestive) heart failure: Secondary | ICD-10-CM | POA: Diagnosis not present

## 2019-10-24 DIAGNOSIS — I10 Essential (primary) hypertension: Secondary | ICD-10-CM

## 2019-10-24 DIAGNOSIS — I110 Hypertensive heart disease with heart failure: Secondary | ICD-10-CM | POA: Insufficient documentation

## 2019-10-24 DIAGNOSIS — R0683 Snoring: Secondary | ICD-10-CM | POA: Diagnosis not present

## 2019-10-24 LAB — BASIC METABOLIC PANEL
Anion gap: 10 (ref 5–15)
BUN: 26 mg/dL — ABNORMAL HIGH (ref 6–20)
CO2: 28 mmol/L (ref 22–32)
Calcium: 8.7 mg/dL — ABNORMAL LOW (ref 8.9–10.3)
Chloride: 102 mmol/L (ref 98–111)
Creatinine, Ser: 1.27 mg/dL — ABNORMAL HIGH (ref 0.44–1.00)
GFR calc Af Amer: 59 mL/min — ABNORMAL LOW (ref >60)
GFR calc non Af Amer: 51 mL/min — ABNORMAL LOW (ref >60)
Glucose, Bld: 103 mg/dL — ABNORMAL HIGH (ref 70–99)
Potassium: 3.5 mmol/L (ref 3.5–5.1)
Sodium: 140 mmol/L (ref 135–145)

## 2019-10-24 LAB — GLUCOSE, CAPILLARY: Glucose-Capillary: 115 mg/dL — ABNORMAL HIGH (ref 70–99)

## 2019-10-24 MED ORDER — SACUBITRIL-VALSARTAN 49-51 MG PO TABS: 1.0000 | ORAL_TABLET | Freq: Two times a day (BID) | ORAL | 5 refills | Status: DC

## 2019-10-24 NOTE — Patient Instructions (Addendum)
Continue weighing daily and call for an overnight weight gain of > 2 pounds or a weekly weight gain of >5 pounds.   Stop taking valsartan and begin entresto 49/51mg  twice daily beginning tomorrow.

## 2019-10-24 NOTE — Progress Notes (Signed)
Patient ID: Heather Norman, female    DOB: Feb 22, 1976, 44 y.o.   MRN: [id number]  HPI  Heather Norman is a 44 y/o female with a history of arthritis, DM, hyperlipidemia, HTN, morbid obesity and chronic heart failure.  Echo report from 06/16/2019 reviewed and showed an EF of 60-65% along with moderately increased LVH. Echo report from 05/16/17 reviewed and showed an EF of 55-60% along with mild MR. Reviewed echo report from 05/15/14 which showed an EF of 60-65% along with mild MR and normal PA pressure.  Was in the ED 10/16/19 due to HF exacerbation. Given IV lasix, symptoms improved and she was released.   She presents today for a follow-up visit from her recent ED visit with a chief complaint of moderate shortness of breath upon moderate exertion. She describes this as chronic in nature having been present for several years although she does feel like it has worsened recently and gets worse when she has to wear a mask. She has associated fatigue, difficulty sleeping and intermittent numbness in her hands. She denies any abdominal distention, palpitations, pedal edema, chest pain, dizziness, cough or weight gain.   She says that since her ED visit, she's been taking torsemide 60mg  BID instead of 40mg  BID   Past Medical History:  Diagnosis Date  . (HFpEF) heart failure with preserved ejection fraction (HCC)    a. 04/2017 Echo: EF 55-60%, no rwma, Mild MR, mildly dil LA. Nl RV fxn.  . Arthritis    kness/hands - no meds  . CHF (congestive heart failure) (HCC)   . Diabetes mellitus without complication (HCC)   . Dyspnea on exertion   . Headache(784.0)    otc med prn  . Heart murmur   . Hyperlipidemia    no meds since pregnancy  . Hypertension   . Morbid obesity (HCC)    Past Surgical History:  Procedure Laterality Date  . CERVICAL CERCLAGE  06/01/2011   Procedure: CERCLAGE CERVICAL;  Surgeon: 05/2017, DO;  Location: [facility];  Service: Gynecology;  Laterality: N/A;  REQUESTING  PORTABLE ULTARSOUND AT BEDSIDE PT'S EDC:12/02/2011  . CERVICAL CERCLAGE  06/01/2011   Procedure: CERCLAGE CERVICAL;  Surgeon: 02/01/2012, DO;  Location: [facility];  Service: Gynecology;  Laterality: N/A;  REQUESTING PORTABLE ULTARSOUND AT BEDSIDE PT'S EDC:12/02/2011  . endosocpy    . svd     x 2  . tab     x 1  . TUBAL LIGATION     Family History  Problem Relation Age of Onset  . Stroke Mother   . Hypertension Mother   . Hyperlipidemia Mother   . Hypertension Father    Social History   Tobacco Use  . Smoking status: Never Smoker  . Smokeless tobacco: Never Used  Substance Use Topics  . Alcohol use: No   Prior to Admission medications   Medication Sig Start Date End Date Taking? Authorizing Provider  amLODipine (NORVASC) 10 MG tablet TAKE 1 TABLET BY MOUTH  DAILY 12/07/17  Yes Gollan, 02/01/2012, MD  aspirin 81 MG tablet Take 1 tablet (81 mg total) by mouth daily. 06/28/17  Yes Tollie Pizza, MD  carvedilol (COREG) 25 MG tablet Take 25 mg by mouth 2 (two) times daily with a meal.   Yes [provider]  fluticasone (FLONASE) 50 MCG/ACT nasal spray Place 1 spray into both nostrils daily as needed for allergies or rhinitis.   Yes [provider]  JARDIANCE 10 MG TABS tablet Take 10  mg by mouth daily. 10/10/19  Yes [provider]  loratadine (CLARITIN) 10 MG tablet Take 10 mg by mouth daily. Takes daily   Yes [provider]  oxybutynin (DITROPAN) 5 MG tablet Take 5 mg by mouth 2 (two) times daily. 12/25/17  Yes [provider]  potassium chloride 20 MEQ/15ML (10%) SOLN Take 45 mLs (60 mEq total) by mouth daily. 01/20/19  Yes Minna Merritts, MD  spironolactone (ALDACTONE) 25 MG tablet Take 1 tablet (25 mg total) by mouth daily. 12/26/18 10/24/19 Yes Gollan, Kathlene November, MD  torsemide (DEMADEX) 20 MG tablet Take 2 tablets (40 mg total) by mouth 2 (two) times daily. Patient taking differently: Take 60 mg by mouth 2 (two) times daily.   12/09/18  Yes Minna Merritts, MD  valsartan (DIOVAN) 160 MG tablet Take 2 tablets (320 mg total) by mouth daily. 12/26/18  Yes Gollan, Kathlene November, MD  albuterol (PROVENTIL HFA;VENTOLIN HFA) 108 (90 Base) MCG/ACT inhaler Inhale 2 puffs into the lungs every 4 (four) hours as needed for wheezing or shortness of breath. q4-6 h PRN for shortness of breath    [provider]     Review of Systems  Constitutional: Positive for fatigue. Negative for appetite change.  HENT: Negative for congestion, postnasal drip and sore throat.   Eyes: Negative.   Respiratory: Positive for shortness of breath (with moderate exertion). Negative for cough, chest tightness and wheezing.   Cardiovascular: Negative for chest pain, palpitations and leg swelling.  Gastrointestinal: Negative for abdominal distention, abdominal pain and diarrhea.  Endocrine: Negative.   Genitourinary: Negative.   Musculoskeletal: Positive for back pain (when walking for long periods). Negative for neck pain.  Skin: Negative.   Allergic/Immunologic: Negative.   Neurological: Positive for numbness (hands ) and headaches. Negative for dizziness and light-headedness.  Hematological: Negative for adenopathy. Does not bruise/bleed easily.  Psychiatric/Behavioral: Positive for sleep disturbance (sleeping on 3 pillows). Negative for dysphoric mood. The patient is not nervous/anxious.     Vitals:   10/24/19 1240  BP: 129/64  Pulse: (!) 59  Resp: 20  SpO2: 98%  Weight: 289 lb 2 oz (131.1 kg)  Height: 5\' 6"  (1.676 m)   Wt Readings from Last 3 Encounters:  10/24/19 289 lb 2 oz (131.1 kg)  10/16/19 290 lb (131.5 kg)  06/09/19 283 lb (128.4 kg)   Lab Results  Component Value Date   CREATININE 1.11 (H) 10/16/2019   CREATININE 1.06 (H) 05/20/2019   CREATININE 1.04 (H) 05/12/2019    Physical Exam  Constitutional: She is oriented to person, place, and time. She appears well-developed and well-nourished.  HENT:  Head:  Normocephalic and atraumatic.  Neck: Normal range of motion. Neck supple. No JVD present.  Cardiovascular: Normal rate and regular rhythm.  Murmur heard. Pulmonary/Chest: Effort normal. She has no wheezes. She has no rales.  Abdominal: Soft. She exhibits no distension. There is no abdominal tenderness.  Musculoskeletal:        General: No tenderness or edema.  Neurological: She is alert and oriented to person, place, and time.  Skin: Skin is warm and dry.  Psychiatric: She has a normal mood and affect. Her behavior is normal. Thought content normal.  Nursing note and vitals reviewed.   Assessment & Plan:  1: Chronic heart failure with preserved ejection fraction- - NYHA class II - euvolemic today - weighing daily; reminded to call for an overnight weight gain of >2 pounds or a weekly weight gain of >5 pounds -  weight up 6 pounds from last visit 4 months ago - not adding salt to her food and is trying to follow a low sodium diet - encouraged her to be as active as possible - due to structural changes with her heart and new indication for entresto, will stop valsartan and begin entresto 49/51mg  BID to be started tomorrow; 30 day voucher given to patient - will check BMP at her next visit - patient says that she knows that she'd drinking too much fluids; says that she drinks 64 ounces of bottled water daily along with ~ 32 ounces of additional fluids due to a dry mouth - explained that she needed to try and decrease her daily fluid consumption to ~ 64 total ounces of fluid daily - had telemedicine visit with cardiology Mariah Milling) 12/09/2018 & returns 11/06/19 - BNP 10/16/19 was 30.0 - has received her flu vaccine for this season  2: HTN- - BP looks good today - sees PCP [facility])  - BMP from 10/16/19 reviewed and shows sodium 139, potassium 3.2, creatinine 1.11 and GFR >60 - will recheck BMP today due to hypokalemia  3: Diabetes-  - not checking her glucose at home -  nonfasting glucose in clinic today was 115 - last A1c was 6.6% in 2014  4: Snoring- - She snores every night and sometimes wakes up gasping - She is as tired when she wakes up as when she went to bed - Patient says that her sleep study was scheduled for 06/20/2019 but says that her insurance won't pay for it - advised her to call her insurance company and see if they will pay for it now and, if not, to see if they'll cover an in home sleep study   Patient did not bring her medications nor a list. Each medication was verbally reviewed with the patient and she was encouraged to bring the bottles to every visit to confirm accuracy of list.  Return in 2 months or sooner for any questions/problems before then.    ADDENDUM: lab results received prior to completing this note. LM for patient to decrease her torsemide down to 40mg  BID (was taking 60mg  BID) due to worsening renal function. Will ask cardiology to recheck BMP at her visit on 11/06/19.

## 2019-11-06 ENCOUNTER — Encounter: Payer: Self-pay | Admitting: Nurse Practitioner

## 2019-11-06 ENCOUNTER — Other Ambulatory Visit: Payer: Self-pay

## 2019-11-06 ENCOUNTER — Ambulatory Visit (INDEPENDENT_AMBULATORY_CARE_PROVIDER_SITE_OTHER): Payer: Managed Care, Other (non HMO) | Admitting: Nurse Practitioner

## 2019-11-06 ENCOUNTER — Other Ambulatory Visit
Admission: RE | Admit: 2019-11-06 | Discharge: 2019-11-06 | Disposition: A | Discharge status: Home / Self Care | Payer: Managed Care, Other (non HMO) | Source: Ambulatory Visit | Attending: Nurse Practitioner | Admitting: Nurse Practitioner

## 2019-11-06 VITALS — BP 102/62 | HR 79 | Ht 66.0 in | Wt 284.4 lb

## 2019-11-06 DIAGNOSIS — I5032 Chronic diastolic (congestive) heart failure: Secondary | ICD-10-CM

## 2019-11-06 DIAGNOSIS — I10 Essential (primary) hypertension: Secondary | ICD-10-CM

## 2019-11-06 DIAGNOSIS — I110 Hypertensive heart disease with heart failure: Secondary | ICD-10-CM | POA: Insufficient documentation

## 2019-11-06 LAB — BASIC METABOLIC PANEL
Anion gap: 9 (ref 5–15)
BUN: 18 mg/dL (ref 6–20)
CO2: 28 mmol/L (ref 22–32)
Calcium: 8.8 mg/dL — ABNORMAL LOW (ref 8.9–10.3)
Chloride: 103 mmol/L (ref 98–111)
Creatinine, Ser: 1.45 mg/dL — ABNORMAL HIGH (ref 0.44–1.00)
GFR calc Af Amer: 50 mL/min — ABNORMAL LOW (ref >60)
GFR calc non Af Amer: 43 mL/min — ABNORMAL LOW (ref >60)
Glucose, Bld: 127 mg/dL — ABNORMAL HIGH (ref 70–99)
Potassium: 3.4 mmol/L — ABNORMAL LOW (ref 3.5–5.1)
Sodium: 140 mmol/L (ref 135–145)

## 2019-11-06 NOTE — Patient Instructions (Signed)
Medication Instructions:  No changes  *If you need a refill on your cardiac medications before your next appointment, please call your pharmacy*   Lab Work: BMET today  If you have labs (blood work) drawn today and your tests are completely normal, you will receive your results only by: Marland Kitchen MyChart Message (if you have MyChart) OR . A paper copy in the mail If you have any lab test that is abnormal or we need to change your treatment, we will call you to review the results.   Testing/Procedures: None   Follow-Up: At [facility], you and your health needs are our priority.  As part of our continuing mission to provide you with exceptional heart care, we have created designated Provider Care Teams.  These Care Teams include your primary Cardiologist (physician) and Advanced Practice Providers (APPs -  Physician Assistants and Nurse Practitioners) who all work together to provide you with the care you need, when you need it.  We recommend signing up for the patient portal called "MyChart".  Sign up information is provided on this After Visit Summary.  MyChart is used to connect with patients for Virtual Visits (Telemedicine).  Patients are able to view lab/test results, encounter notes, upcoming appointments, etc.  Non-urgent messages can be sent to your provider as well.   To learn more about what you can do with MyChart, go to [email or web address].    Your next appointment:   4-6  month(s)  The format for your next appointment:   In Person  Provider:    You may see Julien Nordmann, MD or one of the following Advanced Practice Providers on your designated Care Team:    Nicolasa Ducking, NP  Eula Listen, PA-C  Marisue Ivan, PA-C

## 2019-11-06 NOTE — Progress Notes (Signed)
Office Visit    Patient Name: Heather Norman Date of Encounter: 11/06/2019  Primary Care Provider:  [facility] Primary Cardiologist:  Julien Nordmann, MD  Chief Complaint    44 year old female with a history of HFpEF, diabetes, hypertension, hyperlipidemia, and obesity, who presents for follow-up of heart failure.  Past Medical History    Past Medical History:  Diagnosis Date  . (HFpEF) heart failure with preserved ejection fraction (HCC)    a. 04/2017 Echo: EF 55-60%, no rwma, Mild MR, mildly dil LA. Nl RV fxn; b. 06/2019 Echo: EF 60-65%, mod LVH. Gr2 DD. Mildly dil LA.  Marland Kitchen Arthritis    kness/hands - no meds  . CHF (congestive heart failure) (HCC)   . Diabetes mellitus without complication (HCC)   . Dyspnea on exertion   . Headache(784.0)    otc med prn  . Heart murmur   . Hyperlipidemia    no meds since pregnancy  . Hypertension   . Morbid obesity (HCC)    Past Surgical History:  Procedure Laterality Date  . CERVICAL CERCLAGE  06/01/2011   Procedure: CERCLAGE CERVICAL;  Surgeon: Philip Aspen, DO;  Location: [facility];  Service: Gynecology;  Laterality: N/A;  REQUESTING PORTABLE ULTARSOUND AT BEDSIDE PT'S EDC:12/02/2011  . CERVICAL CERCLAGE  06/01/2011   Procedure: CERCLAGE CERVICAL;  Surgeon: Philip Aspen, DO;  Location: [facility];  Service: Gynecology;  Laterality: N/A;  REQUESTING PORTABLE ULTARSOUND AT BEDSIDE PT'S EDC:12/02/2011  . endosocpy    . svd     x 2  . tab     x 1  . TUBAL LIGATION      Allergies  Allergies  Allergen Reactions  . Penicillins Anaphylaxis    Has patient had a PCN reaction causing immediate rash, facial/tongue/throat swelling, SOB or lightheadedness with hypotension: Yes Has patient had a PCN reaction causing severe rash involving mucus membranes or skin necrosis: No Has patient had a PCN reaction that required hospitalization: No Has patient had a PCN reaction occurring within the last 10 years: No If all  of the above answers are "NO", then may proceed with Cephalosporin use.    History of Present Illness    44 year old female with the above complex past medical history including obesity, hypertension, diabetes, HFpEF, and hyperlipidemia.  Most recent echo in November 2020 showed normal LV function with grade 2 diastolic dysfunction.  She was seen in the emergency department on March 18 due to heart failure exacerbation and was given IV Lasix with symptomatic improvement.  She was able to be discharged home from the ED.  She was last seen in heart failure clinic on March 26, at which time she was euvolemic on examination though it was noted that weight was up 6 pounds from visit 4 months earlier.  She was switched from valsartan to [facility] and is due for a basic metabolic panel today.  Since her last visit, she has been stable.  She does note dyspnea on exertion with higher levels of activity such as walking up stairs but generally tolerates regular activities that she can perform at her own pace.  Weight has been stable at home at approximately 284 pounds, which is her weight today.  She has not noticed any significant lower extremity swelling and denies PND, orthopnea, chest pain, palpitations, dizziness, syncope, or early satiety.  As best she can tell, she is tolerating Entresto well and her blood pressure has been improved.  Home Medications    Prior to  Admission medications   Medication Sig Start Date End Date Taking? Authorizing Provider  albuterol (PROVENTIL HFA;VENTOLIN HFA) 108 (90 Base) MCG/ACT inhaler Inhale 2 puffs into the lungs every 4 (four) hours as needed for wheezing or shortness of breath. q4-6 h PRN for shortness of breath   Yes [provider]  amLODipine (NORVASC) 10 MG tablet TAKE 1 TABLET BY MOUTH  DAILY 12/07/17  Yes Gollan, Kathlene November, MD  aspirin 81 MG tablet Take 1 tablet (81 mg total) by mouth daily. 06/28/17  Yes Minna Merritts, MD  carvedilol (COREG) 25 MG  tablet Take 25 mg by mouth 2 (two) times daily with a meal.   Yes [provider]  fluticasone (FLONASE) 50 MCG/ACT nasal spray Place 1 spray into both nostrils daily as needed for allergies or rhinitis.   Yes [provider]  JARDIANCE 10 MG TABS tablet Take 10 mg by mouth daily. 10/10/19  Yes [provider]  loratadine (CLARITIN) 10 MG tablet Take 10 mg by mouth daily. Takes daily   Yes [provider]  oxybutynin (DITROPAN) 5 MG tablet Take 5 mg by mouth 2 (two) times daily. 12/25/17  Yes [provider]  potassium chloride 20 MEQ/15ML (10%) SOLN Take 45 mLs (60 mEq total) by mouth daily. 01/20/19  Yes Gollan, Kathlene November, MD  sacubitril-valsartan (ENTRESTO) 49-51 MG Take 1 tablet by mouth 2 (two) times daily. 10/24/19  Yes Darylene Price A, FNP  torsemide (DEMADEX) 20 MG tablet Take 2 tablets (40 mg total) by mouth 2 (two) times daily. 12/09/18  Yes Minna Merritts, MD  spironolactone (ALDACTONE) 25 MG tablet Take 1 tablet (25 mg total) by mouth daily. 12/26/18 10/24/19  Minna Merritts, MD    Review of Systems    Chronic, stable dyspnea on exertion at higher levels of activity and with inclines.  She denies chest pain, palpitations, PND, orthopnea, dizziness, syncope, edema, or early satiety.  All other systems reviewed and are otherwise negative except as noted above.  Physical Exam    VS:  BP 102/62 (BP Location: Left Arm, Patient Position: Sitting, Cuff Size: Large)   Pulse 79   Ht 5\' 6"  (1.676 m)   Wt 284 lb 6 oz (129 kg)   LMP  (LMP Unknown)   SpO2 97%   BMI 45.90 kg/m  , BMI Body mass index is 45.9 kg/m. GEN: Well nourished, well developed, in no acute distress. HEENT: normal. Neck: Supple, no JVD, carotid bruits, or masses. Cardiac: RRR, no murmurs, rubs, or gallops. No clubbing, cyanosis, trace bilateral ankle edema.  Radials/PT 2+ and equal bilaterally.  Respiratory:  Respirations regular and unlabored, clear to auscultation  bilaterally. GI: Soft, nontender, nondistended, BS + x 4. MS: no deformity or atrophy. Skin: warm and dry, no rash. Neuro:  Strength and sensation are intact. Psych: Normal affect.  Accessory Clinical Findings    ECG personally reviewed by me today -regular sinus rhythm, 79, left axis deviation, left ventricular hypertrophy with repolarization abnormalities - no acute changes.  Lab Results  Component Value Date   WBC 8.8 10/16/2019   HGB 12.7 10/16/2019   HCT 39.9 10/16/2019   MCV 84.4 10/16/2019   PLT 355 10/16/2019   Lab Results  Component Value Date   CREATININE 1.27 (H) 10/24/2019   BUN 26 (H) 10/24/2019   NA 140 10/24/2019   K 3.5 10/24/2019   CL 102 10/24/2019   CO2 28 10/24/2019   Lab Results  Component Value Date  ALT 19 10/16/2019   AST 18 10/16/2019   ALKPHOS 85 10/16/2019   BILITOT 0.6 10/16/2019   Lab Results  Component Value Date   CHOL 168 01/14/2015   HDL 42 01/14/2015   LDLCALC 106 (H) 01/14/2015   TRIG 102 01/14/2015   CHOLHDL 4.0 01/14/2015    Lab Results  Component Value Date   HGBA1C 6.6 (H) 12/13/2012    Assessment & Plan    1.  Chronic heart failure with preserved ejection fraction: EF 60-65% with grade 2 diastolic dysfunction by echocardiogram in December 2020.  She has been doing reasonably well since being transitioned to [place] therapy last month.  Weight has been stable at 284 pounds, which is actually down 5 pounds since her heart failure clinic visit on March 26.  She has chronic stable dyspnea and only trace lower extremity edema on exam.  Heart rate and blood pressure well controlled, which she attributes to the switch to [facility].  Continue current dose of Entresto, amlodipine, carvedilol, spironolactone, and torsemide.  Follow-up basic metabolic panel today.  Renal function stable, I suspect she may benefit from further titration of Entresto therapy with reduction and possibly discontinuation of amlodipine.  2.  Essential  hypertension: Stable/soft at 102/62.  Continue current regimen and consider reducing amlodipine in favor of Entresto titration pending labs.  3.  Daytime somnolence: She has been ordered a sleep study by primary care and she is just working out the logistics of this with insurance prior to scheduling.  4.  Type 2 diabetes mellitus: Followed by primary care.  She is on Jardiance.  5.  Morbid obesity: We spoke at length today about her weight loss struggles.  She says she is really done a good job of cutting her calories throughout the day but has not really noticed any significant weight loss.  She is down 5 pounds from her most recent heart failure clinic visit last month.  I recommended that she might benefit from keeping a calorie journal in order to better understand where things are going well and where they might not be.  Encouraged her to consider using a fitness application to assist in monitoring caloric intake.  She is not currently exercising and I encouraged her to try and get 30 minutes of daily activity even if that is just walking.    6.  Disposition: Follow-up basic metabolic panel today.  She has heart failure clinic follow-up next month.  We will arrange for follow-up with Dr. Mariah Milling in 4 to 6 months.  Nicolasa Ducking, NP 11/06/2019, 1:26 PM

## 2019-11-07 ENCOUNTER — Telehealth: Payer: Self-pay

## 2019-11-07 DIAGNOSIS — I5032 Chronic diastolic (congestive) heart failure: Secondary | ICD-10-CM

## 2019-11-07 MED ORDER — TORSEMIDE 20 MG PO TABS: 40.0000 mg | ORAL_TABLET | Freq: Every day | ORAL | 3 refills | Status: DC

## 2019-11-07 NOTE — Telephone Encounter (Signed)
-----   Message from Creig Hines, NP sent at 11/06/2019  4:23 PM EDT ----- Kidney's look a little dry.  As we discussed today, sometimes pts do not require as much torsemide once entresto is started.  Pls have her reduce torsemide to 40mg  daily and f/u bmet in 1 wk. Potassium is mildly low.  Please take an additional of KCl x 1.  As we are reducing torsemide dose, I will not change daily KCl dose and we'll get f/u K w/ next week's bmet.

## 2019-11-07 NOTE — Telephone Encounter (Signed)
Call to patient to review labs.  °  °Pt verbalized understanding and has no further questions at this time.  °  °Advised pt to call for any further questions or concerns.  °Orders updated.  ° °

## 2019-11-11 ENCOUNTER — Ambulatory Visit: Payer: Managed Care, Other (non HMO) | Attending: Internal Medicine

## 2019-11-11 DIAGNOSIS — Z23 Encounter for immunization: Secondary | ICD-10-CM

## 2019-11-11 NOTE — Progress Notes (Signed)
   Covid-19 Vaccination Clinic  Name:  Heather Norman    MRN: [id number] DOB: 1976-03-30  11/11/2019  [name] was observed post Covid-19 immunization for 30 minutes based on pre-vaccination screening without incident. She was provided with Vaccine Information Sheet and instruction to access the V-Safe system.   [name] was instructed to call 911 with any severe reactions post vaccine: Marland Kitchen Difficulty breathing  . Swelling of face and throat  . A fast heartbeat  . A bad rash all over body  . Dizziness and weakness   Immunizations Administered    Name Date Dose VIS Date Route   Pfizer COVID-19 Vaccine 11/11/2019  1:28 PM 0.3 mL 07/11/2019 Intramuscular   Manufacturer: [employer], [employer]   Lot: [id number]   NDC: [address]-4

## 2019-11-14 ENCOUNTER — Other Ambulatory Visit
Admission: RE | Admit: 2019-11-14 | Discharge: 2019-11-14 | Disposition: A | Discharge status: Home / Self Care | Payer: Managed Care, Other (non HMO) | Source: Ambulatory Visit | Attending: Nurse Practitioner | Admitting: Nurse Practitioner

## 2019-11-14 DIAGNOSIS — I5032 Chronic diastolic (congestive) heart failure: Secondary | ICD-10-CM | POA: Diagnosis not present

## 2019-11-14 LAB — BASIC METABOLIC PANEL
Anion gap: 11 (ref 5–15)
BUN: 18 mg/dL (ref 6–20)
CO2: 26 mmol/L (ref 22–32)
Calcium: 8.8 mg/dL — ABNORMAL LOW (ref 8.9–10.3)
Chloride: 102 mmol/L (ref 98–111)
Creatinine, Ser: 1.42 mg/dL — ABNORMAL HIGH (ref 0.44–1.00)
GFR calc Af Amer: 52 mL/min — ABNORMAL LOW (ref >60)
GFR calc non Af Amer: 44 mL/min — ABNORMAL LOW (ref >60)
Glucose, Bld: 155 mg/dL — ABNORMAL HIGH (ref 70–99)
Potassium: 4.1 mmol/L (ref 3.5–5.1)
Sodium: 139 mmol/L (ref 135–145)

## 2019-12-08 ENCOUNTER — Ambulatory Visit: Payer: Managed Care, Other (non HMO) | Attending: Family | Admitting: Family

## 2019-12-08 ENCOUNTER — Encounter: Payer: Self-pay | Admitting: Family

## 2019-12-08 ENCOUNTER — Other Ambulatory Visit: Payer: Self-pay

## 2019-12-08 VITALS — BP 117/57 | HR 78 | Resp 20 | Ht 66.0 in | Wt 291.0 lb

## 2019-12-08 DIAGNOSIS — R0683 Snoring: Secondary | ICD-10-CM | POA: Diagnosis not present

## 2019-12-08 DIAGNOSIS — E785 Hyperlipidemia, unspecified: Secondary | ICD-10-CM | POA: Diagnosis not present

## 2019-12-08 DIAGNOSIS — R5383 Other fatigue: Secondary | ICD-10-CM | POA: Insufficient documentation

## 2019-12-08 DIAGNOSIS — Z8249 Family history of ischemic heart disease and other diseases of the circulatory system: Secondary | ICD-10-CM | POA: Diagnosis not present

## 2019-12-08 DIAGNOSIS — Z79899 Other long term (current) drug therapy: Secondary | ICD-10-CM | POA: Diagnosis not present

## 2019-12-08 DIAGNOSIS — I5032 Chronic diastolic (congestive) heart failure: Secondary | ICD-10-CM | POA: Diagnosis not present

## 2019-12-08 DIAGNOSIS — R0602 Shortness of breath: Secondary | ICD-10-CM | POA: Diagnosis present

## 2019-12-08 DIAGNOSIS — E119 Type 2 diabetes mellitus without complications: Secondary | ICD-10-CM | POA: Insufficient documentation

## 2019-12-08 DIAGNOSIS — R42 Dizziness and giddiness: Secondary | ICD-10-CM | POA: Diagnosis not present

## 2019-12-08 DIAGNOSIS — I110 Hypertensive heart disease with heart failure: Secondary | ICD-10-CM | POA: Diagnosis not present

## 2019-12-08 DIAGNOSIS — R682 Dry mouth, unspecified: Secondary | ICD-10-CM | POA: Diagnosis not present

## 2019-12-08 DIAGNOSIS — Z7982 Long term (current) use of aspirin: Secondary | ICD-10-CM | POA: Diagnosis not present

## 2019-12-08 DIAGNOSIS — R05 Cough: Secondary | ICD-10-CM | POA: Diagnosis not present

## 2019-12-08 DIAGNOSIS — M1990 Unspecified osteoarthritis, unspecified site: Secondary | ICD-10-CM | POA: Insufficient documentation

## 2019-12-08 DIAGNOSIS — I10 Essential (primary) hypertension: Secondary | ICD-10-CM

## 2019-12-08 LAB — GLUCOSE, CAPILLARY: Glucose-Capillary: 154 mg/dL — ABNORMAL HIGH (ref 70–99)

## 2019-12-08 NOTE — Patient Instructions (Addendum)
Continue weighing daily and call for an overnight weight gain of > 2 pounds or a weekly weight gain of >5 pounds.   Increase torsemide to 2 tablets twice a day  Increase potassium to 53ml daily.

## 2019-12-08 NOTE — Progress Notes (Signed)
Patient ID: Heather Norman, female    DOB: [birth year], 44 y.o.   MRN: [id number]  HPI  Heather Norman is a 44 y/o female with a history of arthritis, DM, hyperlipidemia, HTN, morbid obesity and chronic heart failure.  Echo report from 06/16/2019 reviewed and showed an EF of 60-65% along with moderately increased LVH. Echo report from 05/16/17 reviewed and showed an EF of 55-60% along with mild MR. Reviewed echo report from 05/15/14 which showed an EF of 60-65% along with mild MR and normal PA pressure.  Was in the ED 10/16/19 due to HF exacerbation. Given IV lasix, symptoms improved and she was released.   She presents today for a follow-up visit with a chief complaint of moderate shortness of breath upon minimal exertion. She describes this as chronic in nature having been present for several years although does feel worse today. She says that she didn't eat anything different on Mother's Day weekend and hasn't been drinking more fluids than normal but feels more short of breath today. She has associated fatigue, dry cough, positional dizziness, slight weight gain and chronic difficulty sleeping. She denies any abdominal distention, palpitations, pedal edema, chest pain or wheezing.   She says that she still wakes up just as tired as she did when she went to bed along with snoring and waking herself up gasping for breath. She says that her insurance company is telling her that previous provider didn't answer all the questions and that's why her sleep study hasn't gotten approved.   Has been itching on both of her arms since she received her second COVID vaccine on 11/11/19 ( ~ 1 month ago)  Past Medical History:  Diagnosis Date  . (HFpEF) heart failure with preserved ejection fraction (HCC)    a. 04/2017 Echo: EF 55-60%, no rwma, Mild MR, mildly dil LA. Nl RV fxn; b. 06/2019 Echo: EF 60-65%, mod LVH. Gr2 DD. Mildly dil LA.  Marland Kitchen Arthritis    kness/hands - no meds  . CHF (congestive heart failure)  (HCC)   . Diabetes mellitus without complication (HCC)   . Dyspnea on exertion   . Headache(784.0)    otc med prn  . Heart murmur   . Hyperlipidemia    no meds since pregnancy  . Hypertension   . Morbid obesity (HCC)    Past Surgical History:  Procedure Laterality Date  . CERVICAL CERCLAGE  06/01/2011   Procedure: CERCLAGE CERVICAL;  Surgeon: Philip Aspen, DO;  Location: [facility];  Service: Gynecology;  Laterality: N/A;  REQUESTING PORTABLE ULTARSOUND AT BEDSIDE PT'S EDC:12/02/2011  . CERVICAL CERCLAGE  06/01/2011   Procedure: CERCLAGE CERVICAL;  Surgeon: Philip Aspen, DO;  Location: [facility];  Service: Gynecology;  Laterality: N/A;  REQUESTING PORTABLE ULTARSOUND AT BEDSIDE PT'S EDC:12/02/2011  . endosocpy    . svd     x 2  . tab     x 1  . TUBAL LIGATION     Family History  Problem Relation Age of Onset  . Stroke Mother   . Hypertension Mother   . Hyperlipidemia Mother   . Hypertension Father    Social History   Tobacco Use  . Smoking status: Never Smoker  . Smokeless tobacco: Never Used  Substance Use Topics  . Alcohol use: No   Allergies  Allergen Reactions  . Penicillins Anaphylaxis    Has patient had a PCN reaction causing immediate rash, facial/tongue/throat swelling, SOB or lightheadedness with hypotension: Yes Has patient had a PCN reaction  causing severe rash involving mucus membranes or skin necrosis: No Has patient had a PCN reaction that required hospitalization: No Has patient had a PCN reaction occurring within the last 10 years: No If all of the above answers are "NO", then may proceed with Cephalosporin use.   Prior to Admission medications   Medication Sig Start Date End Date Taking? Authorizing Provider  albuterol (PROVENTIL HFA;VENTOLIN HFA) 108 (90 Base) MCG/ACT inhaler Inhale 2 puffs into the lungs every 4 (four) hours as needed for wheezing or shortness of breath. q4-6 h PRN for shortness of breath   Yes [provider]  amLODipine  (NORVASC) 10 MG tablet TAKE 1 TABLET BY MOUTH  DAILY 12/07/17  Yes Gollan, Tollie Pizza, MD  aspirin 81 MG tablet Take 1 tablet (81 mg total) by mouth daily. 06/28/17  Yes [name], MD  carvedilol (COREG) 25 MG tablet Take 25 mg by mouth 2 (two) times daily with a meal.   Yes [provider]  diphenhydrAMINE (BENADRYL) 25 mg capsule Take 25 mg by mouth every 6 (six) hours as needed for itching.   Yes [provider]  fluticasone (FLONASE) 50 MCG/ACT nasal spray Place 1 spray into both nostrils daily as needed for allergies or rhinitis.   Yes [provider]  JARDIANCE 10 MG TABS tablet Take 10 mg by mouth daily. 10/10/19  Yes [provider]  loratadine (CLARITIN) 10 MG tablet Take 10 mg by mouth daily. Takes daily   Yes [provider]  oxybutynin (DITROPAN) 5 MG tablet Take 5 mg by mouth 2 (two) times daily. 12/25/17  Yes [provider]  potassium chloride 20 MEQ/15ML (10%) SOLN Take 45 mLs (60 mEq total) by mouth daily. 01/20/19  Yes Gollan, Tollie Pizza, MD  sacubitril-valsartan (ENTRESTO) 49-51 MG Take 1 tablet by mouth 2 (two) times daily. 10/24/19  Yes [name], FNP  spironolactone (ALDACTONE) 25 MG tablet Take 1 tablet (25 mg total) by mouth daily. 12/26/18 12/08/19 Yes Gollan, Tollie Pizza, MD  torsemide (DEMADEX) 20 MG tablet Take 2 tablets (40 mg total) by mouth daily. 11/07/19  Yes Creig Hines, NP     Review of Systems  Constitutional: Positive for fatigue (easily). Negative for appetite change.  HENT: Negative for congestion and sore throat.   Eyes: Negative.   Respiratory: Positive for cough (dry cough) and shortness of breath (easily). Negative for wheezing.   Cardiovascular: Negative for chest pain, palpitations and leg swelling.  Gastrointestinal: Negative for abdominal distention and abdominal pain.  Endocrine: Negative.   Genitourinary: Negative.   Musculoskeletal: Negative for back pain and neck pain.   Skin: Negative for rash and wound.       Itching on both arms  Allergic/Immunologic: Negative.   Neurological: Positive for dizziness (when bending over at the waist). Negative for light-headedness.  Hematological: Negative for adenopathy. Does not bruise/bleed easily.  Psychiatric/Behavioral: Positive for sleep disturbance. Negative for dysphoric mood. The patient is not nervous/anxious.     Vitals:   12/08/19 0904  BP: (!) 117/57  Pulse: 78  Resp: 20  SpO2: 98%  Weight: 291 lb (132 kg)  Height: 5\' 6"  (1.676 m)   Wt Readings from Last 3 Encounters:  12/08/19 291 lb (132 kg)  11/06/19 284 lb 6 oz (129 kg)  10/24/19 289 lb 2 oz (131.1 kg)   Lab Results  Component Value Date   CREATININE 1.42 (H) 11/14/2019   CREATININE 1.45 (H) 11/06/2019   CREATININE 1.27 (H)  10/24/2019     Physical Exam Vitals and nursing note reviewed.  Constitutional:      Appearance: She is well-developed.  HENT:     Head: Normocephalic and atraumatic.  Neck:     Vascular: No JVD.  Cardiovascular:     Rate and Rhythm: Normal rate and regular rhythm.  Pulmonary:     Effort: Pulmonary effort is normal. No respiratory distress.     Breath sounds: No wheezing or rales.  Abdominal:     Palpations: Abdomen is soft.     Tenderness: There is no abdominal tenderness.  Musculoskeletal:     Cervical back: Normal range of motion and neck supple.     Right lower leg: No tenderness. No edema.     Left lower leg: No tenderness. No edema.  Skin:    General: Skin is warm and dry.  Neurological:     General: No focal deficit present.     Mental Status: She is alert and oriented to person, place, and time.  Psychiatric:        Mood and Affect: Mood normal.        Behavior: Behavior normal.    Assessment & Plan:   1: Chronic heart failure with preserved ejection fraction with structural changes- - NYHA class III - euvolemic today - weighing daily; reminded to call for an overnight weight gain of >2  pounds or a weekly weight gain of >5 pounds - weight up 2 pounds from last visit 6 weeks ago; although up 7 pounds from cardiology visit 1 month ago - will increase her torsemide back to 40mg  BID along with increasing her potassium to 60mg  (67meq) daily - check BMP/BNP at her next visit - not adding salt to her food and is trying to follow a low sodium diet - encouraged her to be as active as possible - patient says that she knows that she'd drinking too much fluids; says that she drinks 64 ounces of bottled water daily along with ~ 32 ounces of additional fluids due to a dry mouth - saw cardiology Sharolyn Douglas) 11/06/19 - BNP 10/16/19 was 30.0 - PharmD reconciled medications with the patient  2: HTN- - BP looks good today - sees PCP [facility])  - BMP from 11/14/19 reviewed and shows sodium 139, potassium 4.1, creatinine 1.42 and GFR 44  3: Diabetes-  - not checking her glucose at home - nonfasting glucose in clinic today was 154 - last A1c was 6.6% in 2014  4: Snoring- - She snores every night and sometimes wakes up gasping - She is as tired when she wakes up as when she went to bed - will make referral for sleep study.    Patient did not bring her medications nor a list. Each medication was verbally reviewed with the patient and she was encouraged to bring the bottles to every visit to confirm accuracy of list.  Return in 4 days or sooner for any questions/problems before then.

## 2019-12-08 NOTE — Progress Notes (Signed)
[facility] - HEART FAILURE CLINIC - PHARMACIST COUNSELING NOTE  ADHERENCE ASSESSMENT  Adherence strategy: Patient takes medications from pill bottles. Patient reports taking medications prior to clinic visit today.   Do you ever forget to take your medication? [] Yes (1) [x] No (0)  Do you ever skip doses due to side effects? [] Yes (1) [x] No (0)  Do you have trouble affording your medicines? [] Yes (1) [x] No (0)  Are you ever unable to pick up your medication due to transportation difficulties? [] Yes (1) [x] No (0)  Do you ever stop taking your medications because you don't believe they are helping? [] Yes (1) [x] No (0)  Total score 0    Recommendations given to patient about increasing adherence: None needed  Guideline-Directed Medical Therapy/Evidence Based Medicine  ACE/ARB/ARNI: Entresto 49-51 mg BID Beta Blocker: Carvedilol 25 mg BID Aldosterone Antagonist: Spironolactone 25 mg daily Diuretic: Torsemide 20 mg BID, patient will double up when feeling bloated    SUBJECTIVE  HPI: Patient is a 44 y/o F with PMH as below who presents to CHF clinic for follow-up  Past Medical History:  Diagnosis Date  . (HFpEF) heart failure with preserved ejection fraction (HCC)    a. 04/2017 Echo: EF 55-60%, no rwma, Mild MR, mildly dil LA. Nl RV fxn; b. 06/2019 Echo: EF 60-65%, mod LVH. Gr2 DD. Mildly dil LA.  Arthritis    kness/hands - no meds  . CHF (congestive heart failure) (HCC)   . Diabetes mellitus without complication (HCC)   . Dyspnea on exertion   . Headache(784.0)    otc med prn  . Heart murmur   . Hyperlipidemia    no meds since pregnancy  . Hypertension   . Morbid obesity (HCC)        OBJECTIVE   Vital signs: HR 78, BP 117/57, weight (pounds) 291 ECHO: Date 06/16/2019, EF 60-65%, notes: moderate LVH, grade II diastolic dysfunction  BMP Latest Ref Rng & Units 11/14/2019 11/06/2019 10/24/2019  Glucose 70 - 99 mg/dL ) ) 54)  BUN 6 - 20  mg/dL 18 18 05/2017)  Creatinine 0.44 - 1.00 mg/dL 07/2019) Marland Kitchen) 06/18/2019)  BUN/Creat Ratio 9 - 23 - - -  Sodium 135 - 145 mmol/L 139 140 140  Potassium 3.5 - 5.1 mmol/L 4.1 3.4(L) 3.5  Chloride 98 - 111 mmol/L 102 103 102  CO2 22 - 32 mmol/L 26 28 28   Calcium 8.9 - 10.3 mg/dL 11/16/2019) 01/06/2020) 10/26/2019)    ASSESSMENT  Patient reports feeling new-onset shortness of breath upon arriving in clinic. She is checking BP at home. Denies any low blood pressures. Endorses feeling dizzy in the morning when sitting. Dizziness does not occur when standing or upon movement. No falls. Denies palpitations / fast HR. Reports she has been itchy (arms) since receipt of 2nd Pfizer vaccine on 11/11/2019 for which she is taking diphenhydramine. Weighing daily in the morning. Weights run 284 - 286 at home. Patient reports weight at home was 289 this morning. Patient reports taking medications prior to office visit today. Denies side effects or missed doses.   PLAN  1). Diastolic CHF -Torsemide 20 mg BID (will double up dose if feeling bloated) + potassium 60 mEq daily, Entresto 49-51 mg BID, empagliflozin 10 mg daily, spironolactone 25 mg daily, carvedilol 25 mg BID -Patient reports good fluid intake  -Spoke with provider with plan to increase torsemide to 40 mg BID and increase potassium to 78 mEq/day. Re-check BMP 5/14.  2). Hypertension -Normotensive in office today.  Checking blood pressure at home. Denies hypotension -Antihypertensive regimen includes amlodipine 10 mg daily, carvedilol 25 mg BID, Entresto 49-51 mg BID, torsemide 20 mg BID, spironolactone 25 mg daily  3). T2DM -Last Hgb A1c on 12/13/2012 was 6.6%. Patient reports it was 6.X% recently at PCP -Empagliflozin 10 mg daily -Patient was on atorvastatin 20 mg daily in the past which appears to have been discontinued secondary to myalgias  4). Elevated Scr -Most recent Scr was 1.42 on 11/14/2019. Previous baseline appears to have been 1. GFR >60 until  recently where it has dropped into low 50s -BMP 5/14   Time spent: 15 minutes  Tressie Ellis Pharmacy Resident 12/08/2019 9:01 AM    Current Outpatient Medications:  .  albuterol (PROVENTIL HFA;VENTOLIN HFA) 108 (90 Base) MCG/ACT inhaler, Inhale 2 puffs into the lungs every 4 (four) hours as needed for wheezing or shortness of breath. q4-6 h PRN for shortness of breath, Disp: , Rfl:  .  amLODipine (NORVASC) 10 MG tablet, TAKE 1 TABLET BY MOUTH  DAILY, Disp: 30 tablet, Rfl: 0 .  aspirin 81 MG tablet, Take 1 tablet (81 mg total) by mouth daily., Disp: 90 tablet, Rfl: 3 .  carvedilol (COREG) 25 MG tablet, Take 25 mg by mouth 2 (two) times daily with a meal., Disp: , Rfl:  .  fluticasone (FLONASE) 50 MCG/ACT nasal spray, Place 1 spray into both nostrils daily as needed for allergies or rhinitis., Disp: , Rfl:  .  JARDIANCE 10 MG TABS tablet, Take 10 mg by mouth daily., Disp: , Rfl:  .  loratadine (CLARITIN) 10 MG tablet, Take 10 mg by mouth daily. Takes daily, Disp: , Rfl:  .  oxybutynin (DITROPAN) 5 MG tablet, Take 5 mg by mouth 2 (two) times daily., Disp: , Rfl:  .  potassium chloride 20 MEQ/15ML (10%) SOLN, Take 45 mLs (60 mEq total) by mouth daily., Disp: 1419 mL, Rfl: 3 .  sacubitril-valsartan (ENTRESTO) 49-51 MG, Take 1 tablet by mouth 2 (two) times daily., Disp: 60 tablet, Rfl: 5 .  spironolactone (ALDACTONE) 25 MG tablet, Take 1 tablet (25 mg total) by mouth daily., Disp: 90 tablet, Rfl: 3 .  torsemide (DEMADEX) 20 MG tablet, Take 2 tablets (40 mg total) by mouth daily., Disp: 180 tablet, Rfl: 3   COUNSELING POINTS/CLINICAL PEARLS  Carvedilol (Goal: weight less than 85 kg is 25 mg BID, weight greater than 85 kg is 50 mg BID)  Patient should avoid activities requiring coordination until drug effects are realized, as drug may cause dizziness.  This drug may cause diarrhea, nausea, vomiting, arthralgia, back pain, myalgia, headache, vision disorder, erectile dysfunction, reduced  libido, or fatigue.  Instruct patient to report signs/symptoms of adverse cardiovascular effects such as hypotension (especially in elderly patients), arrhythmias, syncope, palpitations, angina, or edema.  Drug may mask symptoms of hypoglycemia. Advise diabetic patients to carefully monitor blood sugar levels.  Patient should take drug with food.  Advise patient against sudden discontinuation of drug. Entresto (Goal: 97/103 mg twice daily)  Warn female patient to avoid pregnancy during therapy and to report a pregnancy to a physician.  Advise patient to report symptomatic hypotension.  Side effects may include hyperkalemia, cough, dizziness, or renal failure. Torsemide  Side effects may include excessive urination.  Tell patient to report symptoms of ototoxicity.  Instruct patient to report lightheadedness or syncope.  Warn patient to avoid use of nonprescription NSAID products without first discussing it with their healthcare provider. Spironolactone  Warn patient to report  dehydration, hypotension, or symptoms of worsening renal function.  Counsel female patient to report gynecomastia.  Side effects may include diarrhea, nausea, vomiting, abdominal cramping, fever, leg cramps, lethargy, mental confusion, decreased libido, irregular menses, and rash. Suspension: Tell patient to take drug consistently with respect to food, either before or after a meal.  Advise patient to avoid potassium supplements and foods containing high levels of potassium, including salt substitutes.  DRUGS TO AVOID IN HEART FAILURE  Drug or Class Mechanism  Analgesics . NSAIDs . COX-2 inhibitors . Glucocorticoids  Sodium and water retention, increased systemic vascular resistance, decreased response to diuretics   Diabetes Medications . Metformin . Thiazolidinediones o Rosiglitazone (Avandia) o Pioglitazone (Actos) . DPP4 Inhibitors o Saxagliptin (Onglyza) o Sitagliptin (Januvia)   Lactic acidosis Possible  calcium channel blockade   Unknown  Antiarrhythmics . Class I  o Flecainide o Disopyramide . Class III o Sotalol . Other o Dronedarone  Negative inotrope, proarrhythmic   Proarrhythmic, beta blockade  Negative inotrope  Antihypertensives . Alpha Blockers o Doxazosin . Calcium Channel Blockers o Diltiazem o Verapamil o Nifedipine . Central Alpha Adrenergics o Moxonidine . Peripheral Vasodilators o Minoxidil  Increases renin and aldosterone  Negative inotrope    Possible sympathetic withdrawal  Unknown  Anti-infective . Itraconazole . Amphotericin B  Negative inotrope Unknown  Hematologic . Anagrelide . Cilostazol   Possible inhibition of PD IV Inhibition of PD III causing arrhythmias  Neurologic/Psychiatric . Stimulants . Anti-Seizure Drugs o Carbamazepine o Pregabalin . Antidepressants o Tricyclics o Citalopram . Parkinsons o Bromocriptine o Pergolide o Pramipexole . Antipsychotics o Clozapine . Antimigraine o Ergotamine o Methysergide . Appetite suppressants . Bipolar o Lithium  Peripheral alpha and beta agonist activity  Negative inotrope and chronotrope Calcium channel blockade  Negative inotrope, proarrhythmic Dose-dependent QT prolongation  Excessive serotonin activity/valvular damage Excessive serotonin activity/valvular damage Unknown  IgE mediated hypersensitivy, calcium channel blockade  Excessive serotonin activity/valvular damage Excessive serotonin activity/valvular damage Valvular damage  Direct myofibrillar degeneration, adrenergic stimulation  Antimalarials . Chloroquine . Hydroxychloroquine Intracellular inhibition of lysosomal enzymes  Urologic Agents . Alpha Blockers o Doxazosin o Prazosin o Tamsulosin o Terazosin  Increased renin and aldosterone  Adapted from Page RL, et al. "Drugs That May Cause or Exacerbate Heart Failure: A Scientific Statement from the [employer]." Circulation  2016; 379:K24-O97. DOI: 10.1161/CIR.0000000000000426   MEDICATION ADHERENCES TIPS AND STRATEGIES 1. Taking medication as prescribed improves patient outcomes in heart failure (reduces hospitalizations, improves symptoms, increases survival) 2. Side effects of medications can be managed by decreasing doses, switching agents, stopping drugs, or adding additional therapy. Please let someone in the [facility] know if you have having bothersome side effects so we can modify your regimen. Do not alter your medication regimen without talking to [place].  3. Medication reminders can help patients remember to take drugs on time. If you are missing or forgetting doses you can try linking behaviors, using pill boxes, or an electronic reminder like an alarm on your phone or an app. Some people can also get automated phone calls as medication reminders.

## 2019-12-12 ENCOUNTER — Ambulatory Visit: Payer: Managed Care, Other (non HMO) | Attending: Family | Admitting: Family

## 2019-12-12 ENCOUNTER — Other Ambulatory Visit: Payer: Self-pay

## 2019-12-12 ENCOUNTER — Telehealth: Payer: Self-pay | Admitting: Family

## 2019-12-12 ENCOUNTER — Ambulatory Visit: Payer: Managed Care, Other (non HMO) | Admitting: Family

## 2019-12-12 ENCOUNTER — Encounter: Payer: Self-pay | Admitting: Family

## 2019-12-12 VITALS — BP 96/48 | HR 71 | Resp 18 | Ht 66.0 in | Wt 286.5 lb

## 2019-12-12 DIAGNOSIS — I10 Essential (primary) hypertension: Secondary | ICD-10-CM

## 2019-12-12 DIAGNOSIS — E119 Type 2 diabetes mellitus without complications: Secondary | ICD-10-CM | POA: Diagnosis not present

## 2019-12-12 DIAGNOSIS — I5032 Chronic diastolic (congestive) heart failure: Secondary | ICD-10-CM | POA: Diagnosis present

## 2019-12-12 DIAGNOSIS — Z8249 Family history of ischemic heart disease and other diseases of the circulatory system: Secondary | ICD-10-CM | POA: Insufficient documentation

## 2019-12-12 DIAGNOSIS — Z7984 Long term (current) use of oral hypoglycemic drugs: Secondary | ICD-10-CM | POA: Insufficient documentation

## 2019-12-12 DIAGNOSIS — Z880 Allergy status to penicillin: Secondary | ICD-10-CM | POA: Insufficient documentation

## 2019-12-12 DIAGNOSIS — R0683 Snoring: Secondary | ICD-10-CM | POA: Diagnosis not present

## 2019-12-12 DIAGNOSIS — Z79899 Other long term (current) drug therapy: Secondary | ICD-10-CM | POA: Diagnosis not present

## 2019-12-12 DIAGNOSIS — Z7982 Long term (current) use of aspirin: Secondary | ICD-10-CM | POA: Diagnosis not present

## 2019-12-12 DIAGNOSIS — Z8349 Family history of other endocrine, nutritional and metabolic diseases: Secondary | ICD-10-CM | POA: Insufficient documentation

## 2019-12-12 DIAGNOSIS — E785 Hyperlipidemia, unspecified: Secondary | ICD-10-CM | POA: Insufficient documentation

## 2019-12-12 DIAGNOSIS — I110 Hypertensive heart disease with heart failure: Secondary | ICD-10-CM | POA: Diagnosis not present

## 2019-12-12 LAB — BASIC METABOLIC PANEL
Anion gap: 8 (ref 5–15)
BUN: 26 mg/dL — ABNORMAL HIGH (ref 6–20)
CO2: 28 mmol/L (ref 22–32)
Calcium: 8.9 mg/dL (ref 8.9–10.3)
Chloride: 105 mmol/L (ref 98–111)
Creatinine, Ser: 1.29 mg/dL — ABNORMAL HIGH (ref 0.44–1.00)
GFR calc Af Amer: 58 mL/min — ABNORMAL LOW (ref >60)
GFR calc non Af Amer: 50 mL/min — ABNORMAL LOW (ref >60)
Glucose, Bld: 104 mg/dL — ABNORMAL HIGH (ref 70–99)
Potassium: 4 mmol/L (ref 3.5–5.1)
Sodium: 141 mmol/L (ref 135–145)

## 2019-12-12 MED ORDER — POTASSIUM CHLORIDE 20 MEQ/15ML (10%) PO SOLN: 78.0000 meq | Freq: Every day | ORAL | 5 refills | Status: DC

## 2019-12-12 NOTE — Progress Notes (Signed)
[place] - PHARMACIST COUNSELING NOTE  ADHERENCE ASSESSMENT  Adherence strategy: Patient takes medications from pill bottles.   Do you ever forget to take your medication? [] Yes (1) [x] No (0)  Do you ever skip doses due to side effects? [] Yes (1) [x] No (0)  Do you have trouble affording your medicines? [] Yes (1) [x] No (0)  Are you ever unable to pick up your medication due to transportation difficulties? [] Yes (1) [x] No (0)  Do you ever stop taking your medications because you don't believe they are helping? [] Yes (1) [x] No (0)  Total score 0    Recommendations given to patient about increasing adherence: None needed  Guideline-Directed Medical Therapy/Evidence Based Medicine  ACE/ARB/ARNI: Entresto 49-51 mg BID Beta Blocker: Carvedilol 25 mg BID Aldosterone Antagonist: Spironolactone 25 mg daily Diuretic: Torsemide 40 mg BID (increased at 5/10 visit)    SUBJECTIVE  HPI: Patient is a 44 y/o F with PMH as below who presents to CHF clinic for follow-up. She was previous seen in clinic on 5/10 where torsemide was increased from 20 mg BID to 40 mg BID.   Past Medical History:  Diagnosis Date  . (HFpEF) heart failure with preserved ejection fraction ([place])    a. 04/2017 Echo: EF 55-60%, no rwma, Mild MR, mildly dil LA. Nl RV fxn; b. 06/2019 Echo: EF 60-65%, mod LVH. Gr2 DD. Mildly dil LA.  Marland Kitchen Arthritis    kness/hands - no meds  . CHF (congestive heart failure) ([place])   . Diabetes mellitus without complication ([place])   . Dyspnea on exertion   . Headache(784.0)    otc med prn  . Heart murmur   . Hyperlipidemia    no meds since pregnancy  . Hypertension   . Morbid obesity ([place])       OBJECTIVE   Vital signs: HR 71, BP 96/48, weight (pounds) 286 ECHO: Date 06/16/2019, EF 60-65%, notes: moderate LVH, grade II diastolic dysfunction  BMP Latest Ref Rng & Units 11/14/2019 11/06/2019 10/24/2019  Glucose 70 - 99 mg/dL 155(H) 127(H)  103(H)  BUN 6 - 20 mg/dL 18 18 26(H)  Creatinine 0.44 - 1.00 mg/dL 1.42(H) 1.45(H) 1.27(H)  BUN/Creat Ratio 9 - 23 - - -  Sodium 135 - 145 mmol/L 139 140 140  Potassium 3.5 - 5.1 mmol/L 4.1 3.4(L) 3.5  Chloride 98 - 111 mmol/L 102 103 102  CO2 22 - 32 mmol/L 26 28 28   Calcium 8.9 - 10.3 mg/dL 8.8(L) 8.8(L) 8.7(L)    ASSESSMENT  Patient is well-appearing in no acute distress. She endorses feeling much better since diuretic was increased. Her weight today is 286 lbs which is -5 lbs from 291 lbs earlier this week. Reports itching has been improving and she has not been requiring diphenhydramine. She complains of dry mouth and is drinking a substantial amount of fluids throughout the day.    PLAN  1). Diastolic CHF -Torsemide 40 mg BID + potassium 78 mEq daily, Entresto 49-51 mg BID, empagliflozin 10 mg daily, spironolactone 25 mg daily, carvedilol 25 mg BID -Patient reports good fluid intake  -Titration of medications limited by soft BP -BMP today  2). Hypertension -Hypotensive in office today. Checking blood pressure at home. Endorses dizziness. -Antihypertensive regimen includes amlodipine 10 mg daily, carvedilol 25 mg BID, Entresto 49-51 mg BID, torsemide 20 mg BID, spironolactone 25 mg daily -Discussed with provider - patient will complete amlodipine supply and then discontinue  3). T2DM -Last Hgb A1c on 12/13/2012 was 6.6%.  Patient reports it was 6.X% recently at PCP -Empagliflozin 10 mg daily -Patient was on atorvastatin 20 mg daily in the past which appears to have been discontinued secondary to myalgias  4). Elevated Scr -Most recent Scr was 1.42 on 11/14/2019. Previous baseline appears to have been 1. GFR >60 until recently where it has dropped into low 50s -BMP today  5). Dry mouth -Could be secondary to anticholinergic medications (oxybutynin and/or diphenhydramine) , dehydration , or hyperglycemia -Advised patient to f/u with PCP regarding oxybutynin    Time  spent: 15 minutes  Tressie Ellis Pharmacy Resident 12/12/2019 11:36 AM    Current Outpatient Medications:  .  albuterol (PROVENTIL HFA;VENTOLIN HFA) 108 (90 Base) MCG/ACT inhaler, Inhale 2 puffs into the lungs every 4 (four) hours as needed for wheezing or shortness of breath. q4-6 h PRN for shortness of breath, Disp: , Rfl:  .  amLODipine (NORVASC) 10 MG tablet, TAKE 1 TABLET BY MOUTH  DAILY, Disp: 30 tablet, Rfl: 0 .  aspirin 81 MG tablet, Take 1 tablet (81 mg total) by mouth daily., Disp: 90 tablet, Rfl: 3 .  carvedilol (COREG) 25 MG tablet, Take 25 mg by mouth 2 (two) times daily with a meal., Disp: , Rfl:  .  diphenhydrAMINE (BENADRYL) 25 mg capsule, Take 25 mg by mouth every 6 (six) hours as needed for itching., Disp: , Rfl:  .  fluticasone (FLONASE) 50 MCG/ACT nasal spray, Place 1 spray into both nostrils daily as needed for allergies or rhinitis., Disp: , Rfl:  .  JARDIANCE 10 MG TABS tablet, Take 10 mg by mouth daily., Disp: , Rfl:  .  loratadine (CLARITIN) 10 MG tablet, Take 10 mg by mouth daily. Takes daily, Disp: , Rfl:  .  oxybutynin (DITROPAN) 5 MG tablet, Take 5 mg by mouth 2 (two) times daily., Disp: , Rfl:  .  potassium chloride 20 MEQ/15ML (10%) SOLN, Take 45 mLs (60 mEq total) by mouth daily. (Patient taking differently: Take 78 mEq by mouth daily. Take 5ml daily), Disp: 1419 mL, Rfl: 3 .  sacubitril-valsartan (ENTRESTO) 49-51 MG, Take 1 tablet by mouth 2 (two) times daily., Disp: 60 tablet, Rfl: 5 .  spironolactone (ALDACTONE) 25 MG tablet, Take 1 tablet (25 mg total) by mouth daily., Disp: 90 tablet, Rfl: 3 .  torsemide (DEMADEX) 20 MG tablet, Take 2 tablets (40 mg total) by mouth daily. (Patient taking differently: Take 40 mg by mouth 2 (two) times daily. ), Disp: 180 tablet, Rfl: 3   COUNSELING POINTS/CLINICAL PEARLS  Carvedilol (Goal: weight less than 85 kg is 25 mg BID, weight greater than 85 kg is 50 mg BID)  Patient should avoid activities requiring coordination  until drug effects are realized, as drug may cause dizziness.  This drug may cause diarrhea, nausea, vomiting, arthralgia, back pain, myalgia, headache, vision disorder, erectile dysfunction, reduced libido, or fatigue.  Instruct patient to report signs/symptoms of adverse cardiovascular effects such as hypotension (especially in elderly patients), arrhythmias, syncope, palpitations, angina, or edema.  Drug may mask symptoms of hypoglycemia. Advise diabetic patients to carefully monitor blood sugar levels.  Patient should take drug with food.  Advise patient against sudden discontinuation of drug. Bisoprolol (Goal: 10 mg once daily) Patient should avoid activities requiring mental alertness or coordination until drug effects are realized.  This drug may cause bradyarrhythmia, cold extremities, hypotension, dyspepsia, dizziness, headache, dyssomnia, upper respiratory infection, or fatigue.  Drug may mask symptoms of hypoglycemia. Advise diabetic patients to carefully monitor blood sugar  levels.  Advise patient against sudden discontinuation of drug.  Instruct patient to take a missed dose as soon as possible, but if next dose is in less than 8 h, skip the missed dose. Metoprolol Succinate (Goal: 200 mg once daily) Warn patient to avoid activities requiring mental alertness or coordination until drug effects are realized, as drug may cause dizziness. Tell patient planning major surgery with anesthesia to alert physician that drug is being used, as drug impairs ability of heart to respond to reflex adrenergic stimuli. Drug may cause diarrhea, fatigue, headache, or depression. Advise diabetic patient to carefully monitor blood glucose as drug may mask symptoms of hypoglycemia. Patient should take extended-release tablet with or immediately following meals. Counsel patient against sudden discontinuation of drug, as this may precipitate hypertension, angina, or myocardial infarction. In the event of a  missed dose, counsel patient to skip the missed dose and maintain a regular dosing schedule. Lisinopril (Goal: 20 to 40 mg once daily)  This drug may cause nausea, vomiting, dizziness, headache, or angioedema of face, lips, throat, or intestines.  Instruct patient to report signs/symptoms of hypotension, or a persistent cough.  Advise patient against sudden discontinuation of drug. Enalapril (Goal: 10 to 20 mg mg twice daily)  Patient should avoid activities requiring mental alertness or coordination until drug effects are realized.  Instruct patient to rise slowly from a sitting/supine position, as drug may cause orthostatic hypotension.  This drug may cause nausea, vomiting, diarrhea, fatigue, rash, dizziness, headache, or asthenia.  Instruct patient to report signs/symptoms of hypotension (lightheadedness or syncope) or persistent cough.  Tell patient to report symptoms of angioedema (swelling of face, extremities, eyes, lips, or tongue, or difficulty in swallowing or breathing) or intestinal angioedema (abdominal pain).  Instruct patient to report symptoms of hepatic failure (jaundice) or acute renal failure.  Advise patient to maintain adequate hydration during therapy to prevent volume depletion and an excessive fall in blood pressure.  Instruct patient to immediately report signs/symptoms of infection (sore throat or fever).  Instruct patients/caregivers on the preparation method for the oral solution or suspension and inform them of the expiration date following reconstitution.  Patient should avoid use of potassium-sparing diuretics or potassium-containing supplements or salt substitutes without first consulting their healthcare provider, as the drug may cause increased potassium levels. Losartan (Goal: 150 mg once daily)  Warn female patient to avoid pregnancy and to report a pregnancy that occurs during therapy.  Side effects may include dizziness, upper respiratory infection, nasal  congestion, and back pain.  Warn patient to avoid use of potassium supplements or potassium-containing salt substitutes unless they consult healthcare provider. Valsartan (Goal: 160 mg twice daily)  Advise patient to report lightheadedness or syncope.  Tell patient to avoid activities requiring coordination until drug effects are realized, as this medicine may cause dizziness.  Side effects may include abdominal pain, diarrhea, hypotension, headache, cough, or fatigue.  Advise patient to avoid potassium supplements and foods/salt substitutes that are high in potassium. Entresto (Goal: 97/103 mg twice daily)  Warn female patient to avoid pregnancy during therapy and to report a pregnancy to a physician.  Advise patient to report symptomatic hypotension.  Side effects may include hyperkalemia, cough, dizziness, or renal failure. Furosemide  Drug causes sun-sensitivity. Advise patient to use sunscreen and avoid tanning beds. Patient should avoid activities requiring coordination until drug effects are realized, as drug may cause dizziness, vertigo, or blurred vision. This drug may cause hyperglycemia, hyperuricemia, constipation, diarrhea, loss of appetite, nausea, vomiting,  purpuric disorder, cramps, spasticity, asthenia, headache, paresthesia, or scaling eczema. Instruct patient to report unusual bleeding/bruising or signs/symptoms of hypotension, infection, pancreatitis, or ototoxicity (tinnitus, hearing impairment). Advise patient to report signs/symptoms of a severe skin reactions (flu-like symptoms, spreading red rash, or skin/mucous membrane blistering) or erythema multiforme. Instruct patient to eat high-potassium foods during drug therapy, as directed by healthcare professional.  Patient should not drink alcohol while taking this drug. Torsemide  Side effects may include excessive urination.  Tell patient to report symptoms of ototoxicity.  Instruct patient to report lightheadedness or  syncope.  Warn patient to avoid use of nonprescription NSAID products without first discussing it with their healthcare provider. Spironolactone  Warn patient to report dehydration, hypotension, or symptoms of worsening renal function.  Counsel female patient to report gynecomastia.  Side effects may include diarrhea, nausea, vomiting, abdominal cramping, fever, leg cramps, lethargy, mental confusion, decreased libido, irregular menses, and rash. Suspension: Tell patient to take drug consistently with respect to food, either before or after a meal.  Advise patient to avoid potassium supplements and foods containing high levels of potassium, including salt substitutes. Eplerenone  Patient should avoid activities requiring coordination until drug effects are realized, as drug may cause dizziness.  This drug may cause diarrhea, headache, cough, fatigue, influenza-like illness, angina, or myocardial infarction.  Patient should avoid potassium supplements, potassium-containing salt substitutes and other potassium-sparing diuretics during therapy.  DRUGS TO AVOID IN HEART FAILURE  Drug or Class Mechanism  Analgesics . NSAIDs . COX-2 inhibitors . Glucocorticoids  Sodium and water retention, increased systemic vascular resistance, decreased response to diuretics   Diabetes Medications . Metformin . Thiazolidinediones o Rosiglitazone (Avandia) o Pioglitazone (Actos) . DPP4 Inhibitors o Saxagliptin (Onglyza) o Sitagliptin (Januvia)   Lactic acidosis Possible calcium channel blockade   Unknown  Antiarrhythmics . Class I  o Flecainide o Disopyramide . Class III o Sotalol . Other o Dronedarone  Negative inotrope, proarrhythmic   Proarrhythmic, beta blockade  Negative inotrope  Antihypertensives . Alpha Blockers o Doxazosin . Calcium Channel Blockers o Diltiazem o Verapamil o Nifedipine . Central Alpha Adrenergics o Moxonidine . Peripheral Vasodilators o Minoxidil   Increases renin and aldosterone  Negative inotrope    Possible sympathetic withdrawal  Unknown  Anti-infective . Itraconazole . Amphotericin B  Negative inotrope Unknown  Hematologic . Anagrelide . Cilostazol   Possible inhibition of PD IV Inhibition of PD III causing arrhythmias  Neurologic/Psychiatric . Stimulants . Anti-Seizure Drugs o Carbamazepine o Pregabalin . Antidepressants o Tricyclics o Citalopram . Parkinsons o Bromocriptine o Pergolide o Pramipexole . Antipsychotics o Clozapine . Antimigraine o Ergotamine o Methysergide . Appetite suppressants . Bipolar o Lithium  Peripheral alpha and beta agonist activity  Negative inotrope and chronotrope Calcium channel blockade  Negative inotrope, proarrhythmic Dose-dependent QT prolongation  Excessive serotonin activity/valvular damage Excessive serotonin activity/valvular damage Unknown  IgE mediated hypersensitivy, calcium channel blockade  Excessive serotonin activity/valvular damage Excessive serotonin activity/valvular damage Valvular damage  Direct myofibrillar degeneration, adrenergic stimulation  Antimalarials . Chloroquine . Hydroxychloroquine Intracellular inhibition of lysosomal enzymes  Urologic Agents . Alpha Blockers o Doxazosin o Prazosin o Tamsulosin o Terazosin  Increased renin and aldosterone  Adapted from Page RL, et al. "Drugs That May Cause or Exacerbate Heart Failure: A Scientific Statement from the [employer]." Circulation 2016; 323:F57-D22. DOI: 10.1161/CIR.0000000000000426   MEDICATION ADHERENCES TIPS AND STRATEGIES 1. Taking medication as prescribed improves patient outcomes in heart failure (reduces hospitalizations, improves symptoms, increases survival) 2. Side effects of  medications can be managed by decreasing doses, switching agents, stopping drugs, or adding additional therapy. Please let someone in the Heart Failure Clinic know if you have  having bothersome side effects so we can modify your regimen. Do not alter your medication regimen without talking to [place].  3. Medication reminders can help patients remember to take drugs on time. If you are missing or forgetting doses you can try linking behaviors, using pill boxes, or an electronic reminder like an alarm on your phone or an app. Some people can also get automated phone calls as medication reminders.

## 2019-12-12 NOTE — Patient Instructions (Addendum)
Continue weighing daily and call for an overnight weight gain of > 2 pounds or a weekly weight gain of >5 pounds.  Finish amlodipine out but do not refill it.   Check blood pressure once a day

## 2019-12-12 NOTE — Progress Notes (Signed)
Patient ID: Heather Norman, female    DOB: 11-23-1975, 45 y.o.   MRN: [id number]  HPI  Heather Norman is a 44 y/o female with a history of arthritis, DM, hyperlipidemia, HTN, morbid obesity and chronic heart failure.  Echo report from 06/16/2019 reviewed and showed an EF of 60-65% along with moderately increased LVH. Echo report from 05/16/17 reviewed and showed an EF of 55-60% along with mild MR. Reviewed echo report from 05/15/14 which showed an EF of 60-65% along with mild MR and normal PA pressure.  Was in the ED 10/16/19 due to HF exacerbation. Given IV lasix, symptoms improved and she was released.   She presents today for a follow-up visit with a chief complaint of minimal shortness of breath upon moderate exertion. She describes this as chronic in nature having been present for several years although she says that it's much better over the last few days since she's been taking her diuretic as 40mg  BID. She has associated fatigue, difficulty sleeping and intermittent dizziness along with this. She denies any abdominal distention, palpitations, pedal edema, chest pain, wheezing, cough or weight gain.   Past Medical History:  Diagnosis Date  . (HFpEF) heart failure with preserved ejection fraction (HCC)    a. 04/2017 Echo: EF 55-60%, no rwma, Mild MR, mildly dil LA. Nl RV fxn; b. 06/2019 Echo: EF 60-65%, mod LVH. Gr2 DD. Mildly dil LA.  07/2019 Arthritis    kness/hands - no meds  . CHF (congestive heart failure) (HCC)   . Diabetes mellitus without complication (HCC)   . Dyspnea on exertion   . Headache(784.0)    otc med prn  . Heart murmur   . Hyperlipidemia    no meds since pregnancy  . Hypertension   . Morbid obesity (HCC)    Past Surgical History:  Procedure Laterality Date  . CERVICAL CERCLAGE  06/01/2011   Procedure: CERCLAGE CERVICAL;  Surgeon: 13/07/2010, DO;  Location: [facility];  Service: Gynecology;  Laterality: N/A;  REQUESTING PORTABLE ULTARSOUND AT BEDSIDE PT'S  EDC:12/02/2011  . CERVICAL CERCLAGE  06/01/2011   Procedure: CERCLAGE CERVICAL;  Surgeon: 13/07/2010, DO;  Location: [facility];  Service: Gynecology;  Laterality: N/A;  REQUESTING PORTABLE ULTARSOUND AT BEDSIDE PT'S EDC:12/02/2011  . endosocpy    . svd     x 2  . tab     x 1  . TUBAL LIGATION     Family History  Problem Relation Age of Onset  . Stroke Mother   . Hypertension Mother   . Hyperlipidemia Mother   . Hypertension Father    Social History   Tobacco Use  . Smoking status: Never Smoker  . Smokeless tobacco: Never Used  Substance Use Topics  . Alcohol use: No   Allergies  Allergen Reactions  . Penicillins Anaphylaxis    Has patient had a PCN reaction causing immediate rash, facial/tongue/throat swelling, SOB or lightheadedness with hypotension: Yes Has patient had a PCN reaction causing severe rash involving mucus membranes or skin necrosis: No Has patient had a PCN reaction that required hospitalization: No Has patient had a PCN reaction occurring within the last 10 years: No If all of the above answers are "NO", then may proceed with Cephalosporin use.   Prior to Admission medications   Medication Sig Start Date End Date Taking? Authorizing Provider  albuterol (PROVENTIL HFA;VENTOLIN HFA) 108 (90 Base) MCG/ACT inhaler Inhale 2 puffs into the lungs every 4 (four) hours as needed for wheezing or shortness  of breath. q4-6 h PRN for shortness of breath    [provider]  amLODipine (NORVASC) 10 MG tablet TAKE 1 TABLET BY MOUTH  DAILY 12/07/17   [name], MD  aspirin 81 MG tablet Take 1 tablet (81 mg total) by mouth daily. 06/28/17   [name], MD  carvedilol (COREG) 25 MG tablet Take 25 mg by mouth 2 (two) times daily with a meal.    [provider]  diphenhydrAMINE (BENADRYL) 25 mg capsule Take 25 mg by mouth every 6 (six) hours as needed for itching.    [provider]  fluticasone (FLONASE) 50 MCG/ACT nasal spray Place 1  spray into both nostrils daily as needed for allergies or rhinitis.    [provider]  JARDIANCE 10 MG TABS tablet Take 10 mg by mouth daily. 10/10/19   [provider]  loratadine (CLARITIN) 10 MG tablet Take 10 mg by mouth daily. Takes daily    [provider]  oxybutynin (DITROPAN) 5 MG tablet Take 5 mg by mouth 2 (two) times daily. 12/25/17   [provider]  potassium chloride 20 MEQ/15ML (10%) SOLN Take 45 mLs (60 mEq total) by mouth daily. Patient taking differently: Take 78 mEq by mouth daily. Take 49ml daily 01/20/19   [name], MD  sacubitril-valsartan (ENTRESTO) 49-51 MG Take 1 tablet by mouth 2 (two) times daily. 10/24/19   Delma Freeze, FNP  spironolactone (ALDACTONE) 25 MG tablet Take 1 tablet (25 mg total) by mouth daily. 12/26/18 12/08/19  [name], MD  torsemide (DEMADEX) 20 MG tablet Take 2 tablets (40 mg total) by mouth daily. Patient taking differently: Take 40 mg by mouth 2 (two) times daily.  11/07/19   Creig Hines, NP     Review of Systems  Constitutional: Positive for fatigue (easily). Negative for appetite change.  HENT: Negative for congestion and sore throat.   Eyes: Negative.   Respiratory: Positive for shortness of breath ("much better"). Negative for cough and wheezing.   Cardiovascular: Negative for chest pain, palpitations and leg swelling.  Gastrointestinal: Negative for abdominal distention and abdominal pain.  Endocrine: Negative.   Genitourinary: Negative.   Musculoskeletal: Negative for back pain and neck pain.  Skin: Negative for rash and wound.       Itching on both arms  Allergic/Immunologic: Negative.   Neurological: Positive for dizziness (when bending over at the waist). Negative for light-headedness.  Hematological: Negative for adenopathy. Does not bruise/bleed easily.  Psychiatric/Behavioral: Positive for sleep disturbance. Negative for dysphoric mood. The patient is not  nervous/anxious.    Vitals:   12/12/19 1134  BP: (!) 96/48  Pulse: 71  Resp: 18  SpO2: 96%  Weight: 286 lb 8 oz (130 kg)  Height: 5\' 6"  (1.676 m)   Wt Readings from Last 3 Encounters:  12/12/19 286 lb 8 oz (130 kg)  12/08/19 291 lb (132 kg)  11/06/19 284 lb 6 oz (129 kg)   Lab Results  Component Value Date   CREATININE 1.42 (H) 11/14/2019   CREATININE 1.45 (H) 11/06/2019   CREATININE 1.27 (H) 10/24/2019    Physical Exam Vitals and nursing note reviewed.  Constitutional:      Appearance: She is well-developed.  HENT:     Head: Normocephalic and atraumatic.  Neck:     Vascular: No JVD.  Cardiovascular:     Rate and Rhythm: Normal rate and regular rhythm.  Pulmonary:     Effort: Pulmonary effort is  normal. No respiratory distress.     Breath sounds: No wheezing or rales.  Abdominal:     Palpations: Abdomen is soft.     Tenderness: There is no abdominal tenderness.  Musculoskeletal:     Cervical back: Normal range of motion and neck supple.     Right lower leg: No tenderness. No edema.     Left lower leg: No tenderness. No edema.  Skin:    General: Skin is warm and dry.  Neurological:     General: No focal deficit present.     Mental Status: She is alert and oriented to person, place, and time.  Psychiatric:        Mood and Affect: Mood normal.        Behavior: Behavior normal.    Assessment & Plan:   1: Chronic heart failure with preserved ejection fraction with structural changes- - NYHA class II - euvolemic today - weighing daily; reminded to call for an overnight weight gain of >2 pounds or a weekly weight gain of >5 pounds - weight down 5 pounds from last visit 4 days ago - torsemide increased to 40mg  BID along with increasing her potassium to 60mg  (69meq) daily at her last visit - check BMP today - not adding salt to her food and is trying to follow a low sodium diet - encouraged her to be as active as possible - patient says that she knows that she's  drinking too much fluids; says that she drinks 64 ounces of bottled water daily along with ~ 32 ounces of additional fluids due to a dry mouth - saw cardiology Sharolyn Douglas) 11/06/19 - BNP 10/16/19 was 30.0 - PharmD reconciled medications with the patient  2: HTN- - BP low today; she will finish out her amlodipine and then stop taking it; could titrate up entresto in the future - she will start checking her BP daily and witting it down - sees PCP [facility])  - BMP from 11/14/19 reviewed and shows sodium 139, potassium 4.1, creatinine 1.42 and GFR 44  3: Diabetes-  - not checking her glucose at home - last A1c was 6.6% in 2014  4: Snoring- - She snores every night and sometimes wakes up gasping - She is as tired when she wakes up as when she went to bed - sleep study referral placed at last visit   Patient did not bring her medications nor a list. Each medication was verbally reviewed with the patient and she was encouraged to bring the bottles to every visit to confirm accuracy of list.  Return in 3 weeks or sooner for any questions/problems before then.

## 2019-12-12 NOTE — Telephone Encounter (Signed)
Stspoke with patient regarding lab results that were obtained today. Potassium is normal and renal function looks good.  Also advised that she could stop taking the aspirin per Dr Windell Hummingbird recommendation. Patient verbalized understanding.

## 2019-12-26 ENCOUNTER — Telehealth: Payer: Self-pay | Admitting: Family

## 2019-12-26 NOTE — Telephone Encounter (Signed)
Patient called to say that her BP has been running low over the last couple of days. Reports readings of 95/66 and 98/63. Patient says that she was dizzy yesterday but not today.   At last visit, advised her to finish our her amlodipine and then stop but advised her today to stop her amlodipine. Continue checking her BP and bring readings in to her next appointment with me next week. Call back if BP continues to run low.   Patient verbalized understanding and was comfortable with this plan.

## 2019-12-31 NOTE — Progress Notes (Signed)
Patient ID: Heather Norman, female    DOB: [birth year], 44 y.o.   MRN: [id number]  HPI  Heather Norman is a 44 y/o female with a history of arthritis, DM, hyperlipidemia, HTN, morbid obesity and chronic heart failure.  Echo report from 06/16/2019 reviewed and showed an EF of 60-65% along with moderately increased LVH. Echo report from 05/16/17 reviewed and showed an EF of 55-60% along with mild MR. Reviewed echo report from 05/15/14 which showed an EF of 60-65% along with mild MR and normal PA pressure.  Was in the ED 10/16/19 due to HF exacerbation. Given IV lasix, symptoms improved and she was released.   She presents today for a follow-up visit with a chief complaint of minimal shortness of breath upon moderate exertion. She describes this as chronic in nature having been present for several years. She has associated fatigue, intermittent dizziness and difficulty sleeping along with this. She denies any abdominal distention, palpitations, pedal edema, chest pain, wheezing, cough or weight gain.   Amlodipine was stopped due to hypotension. Home BP's look much better  Past Medical History:  Diagnosis Date  . (HFpEF) heart failure with preserved ejection fraction ([place])    a. 04/2017 Echo: EF 55-60%, no rwma, Mild MR, mildly dil LA. Nl RV fxn; b. 06/2019 Echo: EF 60-65%, mod LVH. Gr2 DD. Mildly dil LA.  Marland Kitchen Arthritis    kness/hands - no meds  . CHF (congestive heart failure) ([place])   . Diabetes mellitus without complication ([place])   . Dyspnea on exertion   . Headache(784.0)    otc med prn  . Heart murmur   . Hyperlipidemia    no meds since pregnancy  . Hypertension   . Morbid obesity ([address])    Past Surgical History:  Procedure Laterality Date  . CERVICAL CERCLAGE  06/01/2011   Procedure: CERCLAGE CERVICAL;  Surgeon: Allyn Kenner, DO;  Location: [facility];  Service: Gynecology;  Laterality: N/A;  REQUESTING PORTABLE ULTARSOUND AT BEDSIDE PT'S EDC:12/02/2011  . CERVICAL CERCLAGE  06/01/2011    Procedure: CERCLAGE CERVICAL;  Surgeon: Allyn Kenner, DO;  Location: [facility];  Service: Gynecology;  Laterality: N/A;  REQUESTING PORTABLE ULTARSOUND AT BEDSIDE PT'S EDC:12/02/2011  . endosocpy    . svd     x 2  . tab     x 1  . TUBAL LIGATION     Family History  Problem Relation Age of Onset  . Stroke Mother   . Hypertension Mother   . Hyperlipidemia Mother   . Hypertension Father    Social History   Tobacco Use  . Smoking status: Never Smoker  . Smokeless tobacco: Never Used  Substance Use Topics  . Alcohol use: No   Allergies  Allergen Reactions  . Penicillins Anaphylaxis    Has patient had a PCN reaction causing immediate rash, facial/tongue/throat swelling, SOB or lightheadedness with hypotension: Yes Has patient had a PCN reaction causing severe rash involving mucus membranes or skin necrosis: No Has patient had a PCN reaction that required hospitalization: No Has patient had a PCN reaction occurring within the last 10 years: No If all of the above answers are "NO", then may proceed with Cephalosporin use.   Prior to Admission medications   Medication Sig Start Date End Date Taking? Authorizing Provider  albuterol (PROVENTIL HFA;VENTOLIN HFA) 108 (90 Base) MCG/ACT inhaler Inhale 2 puffs into the lungs every 4 (four) hours as needed for wheezing or shortness of breath. q4-6 h PRN for shortness of breath  Yes [provider]  aspirin EC 81 MG tablet Take 81 mg by mouth daily.   Yes [provider]  carvedilol (COREG) 25 MG tablet Take 25 mg by mouth 2 (two) times daily with a meal.   Yes [provider]  fexofenadine (ALLEGRA) 180 MG tablet Take 180 mg by mouth daily.   Yes [provider]  fluticasone (FLONASE) 50 MCG/ACT nasal spray Place 1 spray into both nostrils daily as needed for allergies or rhinitis.   Yes [provider]  JARDIANCE 10 MG TABS tablet Take 10 mg by mouth daily. 10/10/19  Yes [provider]   oxybutynin (DITROPAN) 5 MG tablet Take 5 mg by mouth 2 (two) times daily. 12/25/17  Yes [provider]  potassium chloride 20 MEQ/15ML (10%) SOLN Take 58.5 mLs (78 mEq total) by mouth daily. Take 90ml daily 12/12/19  Yes ,  A, FNP  sacubitril-valsartan (ENTRESTO) 49-51 MG Take 1 tablet by mouth 2 (two) times daily. 10/24/19  Yes [name], FNP  spironolactone (ALDACTONE) 25 MG tablet Take 1 tablet (25 mg total) by mouth daily. 12/26/18 01/02/20 Yes Gollan, Tollie Pizza, MD  torsemide (DEMADEX) 20 MG tablet Take 2 tablets (40 mg total) by mouth daily. Patient taking differently: Take 40 mg by mouth 2 (two) times daily.  11/07/19  Yes Creig Hines, NP  loratadine (CLARITIN) 10 MG tablet Take 10 mg by mouth daily. Takes daily    [provider]     Review of Systems  Constitutional: Positive for fatigue (easily). Negative for appetite change.  HENT: Negative for congestion, postnasal drip and sore throat.   Eyes: Negative.   Respiratory: Positive for shortness of breath ("getting better"). Negative for cough and wheezing.   Cardiovascular: Negative for chest pain, palpitations and leg swelling.  Gastrointestinal: Negative for abdominal distention and abdominal pain.  Endocrine: Negative.   Genitourinary: Negative.   Musculoskeletal: Negative for back pain and neck pain.  Skin: Negative for rash and wound.       Itching on both arms  Allergic/Immunologic: Negative.   Neurological: Positive for dizziness (intermittent). Negative for light-headedness.  Hematological: Negative for adenopathy. Does not bruise/bleed easily.  Psychiatric/Behavioral: Positive for sleep disturbance. Negative for dysphoric mood. The patient is not nervous/anxious.    Vitals:   01/02/20 1140  BP: 126/66  Pulse: 60  Resp: 20  SpO2: 100%  Weight: 285 lb 8 oz (129.5 kg)  Height: 5\' 5"  (1.651 m)   Wt Readings from Last 3 Encounters:  01/02/20 285 lb 8 oz (129.5 kg)   12/12/19 286 lb 8 oz (130 kg)  12/08/19 291 lb (132 kg)   Lab Results  Component Value Date   CREATININE 1.29 (H) 12/12/2019   CREATININE 1.42 (H) 11/14/2019   CREATININE 1.45 (H) 11/06/2019    Physical Exam Vitals and nursing note reviewed.  Constitutional:      Appearance: She is well-developed.  HENT:     Head: Normocephalic and atraumatic.  Neck:     Vascular: No JVD.  Cardiovascular:     Rate and Rhythm: Normal rate and regular rhythm.  Pulmonary:     Effort: Pulmonary effort is normal. No respiratory distress.     Breath sounds: No wheezing or rales.  Abdominal:     Palpations: Abdomen is soft.     Tenderness: There is no abdominal tenderness.  Musculoskeletal:     Cervical back: Normal range of motion and neck supple.     Right  lower leg: No tenderness. No edema.     Left lower leg: No tenderness. No edema.  Skin:    General: Skin is warm and dry.  Neurological:     General: No focal deficit present.     Mental Status: She is alert and oriented to person, place, and time.  Psychiatric:        Mood and Affect: Mood normal.        Behavior: Behavior normal.    Assessment & Plan:   1: Chronic heart failure with preserved ejection fraction with structural changes- - NYHA class II - euvolemic today - weighing daily; reminded to call for an overnight weight gain of >2 pounds or a weekly weight gain of >5 pounds - weight stable from last visit 3 weeks ago - not adding salt to her food and is trying to follow a low sodium diet - encouraged her to be as active as possible - saw cardiology Brion Aliment) 11/06/19 - BNP 10/16/19 was 30.0 - PharmD reconciled medications with the patient  2: HTN- - BP looks good today; continue checking it at home - sees PCP [facility])  - BMP from 12/12/19 reviewed and shows sodium 141, potassium 4.0, creatinine 1.29 and GFR 58  3: Diabetes-  - not checking her glucose at home - has been eating watermelon daily;  instructed to eat it as a treat due to sugar content of watermelon - last A1c was 6.6% in 2014  4: Snoring- - She snores every night and sometimes wakes up gasping - She is as tired when she wakes up as when she went to bed - sleep study referral placed at last visit - patient had gotten phone call from sleep lab but has to call them back   Medication bottles reviewed.   Return in 2 months or sooner for any questions/problems before then.

## 2020-01-02 ENCOUNTER — Other Ambulatory Visit: Payer: Self-pay

## 2020-01-02 ENCOUNTER — Ambulatory Visit: Payer: Managed Care, Other (non HMO) | Attending: Family | Admitting: Family

## 2020-01-02 ENCOUNTER — Encounter: Payer: Self-pay | Admitting: Family

## 2020-01-02 VITALS — BP 126/66 | HR 60 | Resp 20 | Ht 65.0 in | Wt 285.5 lb

## 2020-01-02 DIAGNOSIS — Z7982 Long term (current) use of aspirin: Secondary | ICD-10-CM | POA: Diagnosis not present

## 2020-01-02 DIAGNOSIS — I10 Essential (primary) hypertension: Secondary | ICD-10-CM

## 2020-01-02 DIAGNOSIS — Z8349 Family history of other endocrine, nutritional and metabolic diseases: Secondary | ICD-10-CM | POA: Insufficient documentation

## 2020-01-02 DIAGNOSIS — Z8249 Family history of ischemic heart disease and other diseases of the circulatory system: Secondary | ICD-10-CM | POA: Insufficient documentation

## 2020-01-02 DIAGNOSIS — I110 Hypertensive heart disease with heart failure: Secondary | ICD-10-CM | POA: Diagnosis not present

## 2020-01-02 DIAGNOSIS — Z7984 Long term (current) use of oral hypoglycemic drugs: Secondary | ICD-10-CM | POA: Insufficient documentation

## 2020-01-02 DIAGNOSIS — Z880 Allergy status to penicillin: Secondary | ICD-10-CM | POA: Insufficient documentation

## 2020-01-02 DIAGNOSIS — I509 Heart failure, unspecified: Secondary | ICD-10-CM | POA: Diagnosis present

## 2020-01-02 DIAGNOSIS — R0683 Snoring: Secondary | ICD-10-CM

## 2020-01-02 DIAGNOSIS — Z6841 Body Mass Index (BMI) 40.0 and over, adult: Secondary | ICD-10-CM | POA: Diagnosis not present

## 2020-01-02 DIAGNOSIS — Z79899 Other long term (current) drug therapy: Secondary | ICD-10-CM | POA: Diagnosis not present

## 2020-01-02 DIAGNOSIS — I5032 Chronic diastolic (congestive) heart failure: Secondary | ICD-10-CM

## 2020-01-02 DIAGNOSIS — E119 Type 2 diabetes mellitus without complications: Secondary | ICD-10-CM | POA: Diagnosis not present

## 2020-01-02 DIAGNOSIS — Z823 Family history of stroke: Secondary | ICD-10-CM | POA: Insufficient documentation

## 2020-01-02 DIAGNOSIS — M1990 Unspecified osteoarthritis, unspecified site: Secondary | ICD-10-CM | POA: Insufficient documentation

## 2020-01-02 NOTE — Patient Instructions (Addendum)
Continue weighing daily and call for an overnight weight gain of > 2 pounds or a weekly weight gain of >5 pounds.   Return call to sleep lab

## 2020-01-02 NOTE — Progress Notes (Signed)
[facility] - PHARMACIST COUNSELING NOTE  ADHERENCE ASSESSMENT  Adherence strategy: Patient takes medications from pill bottles. Medications brought to clinic today and reviewed.   Do you ever forget to take your medication? [] Yes (1) [x] No (0)  Do you ever skip doses due to side effects? [] Yes (1) [x] No (0)  Do you have trouble affording your medicines? [] Yes (1) [x] No (0)  Are you ever unable to pick up your medication due to transportation difficulties? [] Yes (1) [x] No (0)  Do you ever stop taking your medications because you don't believe they are helping? [] Yes (1) [x] No (0)  Total score 0   Recommendations given to patient about increasing adherence: None needed  Guideline-Directed Medical Therapy/Evidence Based Medicine  ACE/ARB/ARNI: Entresto 49-51 mg BID Beta Blocker: Carvedilol 25 mg BID Aldosterone Antagonist: Spironolactone 25 mg daily Diuretic: Torsemide 40 mg BID (increased at 5/10 visit)    SUBJECTIVE  HPI: Patient is a 44 y/o F with PMH as below who presents to CHF clinic for follow-up. She was previous seen in clinic on 5/14 where amlodipine 10 mg daily was discontinued secondary to soft blood pressures.  Past Medical History:  Diagnosis Date  . (HFpEF) heart failure with preserved ejection fraction ([place])    a. 04/2017 Echo: EF 55-60%, no rwma, Mild MR, mildly dil LA. Nl RV fxn; b. 06/2019 Echo: EF 60-65%, mod LVH. Gr2 DD. Mildly dil LA.  Marland Kitchen Arthritis    kness/hands - no meds  . CHF (congestive heart failure) ([place])   . Diabetes mellitus without complication ([place])   . Dyspnea on exertion   . Headache(784.0)    otc med prn  . Heart murmur   . Hyperlipidemia    no meds since pregnancy  . Hypertension   . Morbid obesity (HCC)      OBJECTIVE   Vital signs: HR 60, BP 126/66, weight (pounds) 285  ECHO: Date11/16/2020, EF60-65%, notes: moderate LVH, grade II diastolic dysfunction  BMP Latest Ref Rng & Units  12/12/2019 11/14/2019 11/06/2019  Glucose 70 - 99 mg/dL 104(H) 155(H) 127(H)  BUN 6 - 20 mg/dL 26(H) 18 18  Creatinine 0.44 - 1.00 mg/dL 1.29(H) 1.42(H) 1.45(H)  BUN/Creat Ratio 9 - 23 - - -  Sodium 135 - 145 mmol/L 141 139 140  Potassium 3.5 - 5.1 mmol/L 4.0 4.1 3.4(L)  Chloride 98 - 111 mmol/L 105 102 103  CO2 22 - 32 mmol/L 28 26 28   Calcium 8.9 - 10.3 mg/dL 8.9 8.8(L) 8.8(L)    ASSESSMENT  Patient is well appearing and in no acute distress. Endorses taking medications daily as prescribed. Denies adverse effects / missed doses. She reports she is actively trying to lose weight through healthy lifestyle changes. She is walking 30 mins/day and is logging foods in [employer].   PLAN  1). Diastolic CHF -Torsemide 40 mg BID + potassium 78 mEq daily, Entresto 49-51 mg BID, empagliflozin 10 mg daily, spironolactone 25 mg daily, carvedilol 25 mg BID -Titration of medications limited by soft BP -Discussed with provider - discussed with provider no changes to medication regimen today  2). Hypertension -Normotensive in office today -Checking blood pressure at home ; running normotensive. Dizziness has subsided after d/c amlodipine -Antihypertensive regimen includes carvedilol 25 mg BID, Entresto 49-51 mg BID, torsemide 20 mg BID, spironolactone 25 mg daily  3). T2DM -Last Hgb A1c on 12/13/2012 was 6.6%. Patient reports it was 6.X% recently at PCP -Empagliflozin 10 mg daily -Patient was on atorvastatin 20  mg daily in the past which appears to have been discontinued secondary to myalgias -Addition of GLP-1 agonist could be considered for weight loss benefit -Walking for 30 mins/day -Logging foods in [employer]  4). Dry mouth -Could be secondary to anticholinergic medications (oxybutynin) , dehydration , or hyperglycemia -Advised patient to f/u with PCP regarding oxybutynin    Time spent: 25 minutes  Tressie Ellis Pharmacy Resident 01/02/2020 11:23 AM    Current Outpatient  Medications:  .  albuterol (PROVENTIL HFA;VENTOLIN HFA) 108 (90 Base) MCG/ACT inhaler, Inhale 2 puffs into the lungs every 4 (four) hours as needed for wheezing or shortness of breath. q4-6 h PRN for shortness of breath, Disp: , Rfl:  .  carvedilol (COREG) 25 MG tablet, Take 25 mg by mouth 2 (two) times daily with a meal., Disp: , Rfl:  .  diphenhydrAMINE (BENADRYL) 25 mg capsule, Take 25 mg by mouth every 6 (six) hours as needed for itching., Disp: , Rfl:  .  fluticasone (FLONASE) 50 MCG/ACT nasal spray, Place 1 spray into both nostrils daily as needed for allergies or rhinitis., Disp: , Rfl:  .  JARDIANCE 10 MG TABS tablet, Take 10 mg by mouth daily., Disp: , Rfl:  .  loratadine (CLARITIN) 10 MG tablet, Take 10 mg by mouth daily. Takes daily, Disp: , Rfl:  .  oxybutynin (DITROPAN) 5 MG tablet, Take 5 mg by mouth 2 (two) times daily., Disp: , Rfl:  .  potassium chloride 20 MEQ/15ML (10%) SOLN, Take 58.5 mLs (78 mEq total) by mouth daily. Take 1ml daily, Disp: 1800 mL, Rfl: 5 .  sacubitril-valsartan (ENTRESTO) 49-51 MG, Take 1 tablet by mouth 2 (two) times daily., Disp: 60 tablet, Rfl: 5 .  spironolactone (ALDACTONE) 25 MG tablet, Take 1 tablet (25 mg total) by mouth daily., Disp: 90 tablet, Rfl: 3 .  torsemide (DEMADEX) 20 MG tablet, Take 2 tablets (40 mg total) by mouth daily. (Patient taking differently: Take 40 mg by mouth 2 (two) times daily. ), Disp: 180 tablet, Rfl: 3   COUNSELING POINTS/CLINICAL PEARLS  Carvedilol (Goal: weight less than 85 kg is 25 mg BID, weight greater than 85 kg is 50 mg BID)  Patient should avoid activities requiring coordination until drug effects are realized, as drug may cause dizziness.  This drug may cause diarrhea, nausea, vomiting, arthralgia, back pain, myalgia, headache, vision disorder, erectile dysfunction, reduced libido, or fatigue.  Instruct patient to report signs/symptoms of adverse cardiovascular effects such as hypotension (especially in elderly  patients), arrhythmias, syncope, palpitations, angina, or edema.  Drug may mask symptoms of hypoglycemia. Advise diabetic patients to carefully monitor blood sugar levels.  Patient should take drug with food.  Advise patient against sudden discontinuation of drug. Entresto (Goal: 97/103 mg twice daily)  Warn female patient to avoid pregnancy during therapy and to report a pregnancy to a physician.  Advise patient to report symptomatic hypotension.  Side effects may include hyperkalemia, cough, dizziness, or renal failure. Torsemide  Side effects may include excessive urination.  Tell patient to report symptoms of ototoxicity.  Instruct patient to report lightheadedness or syncope.  Warn patient to avoid use of nonprescription NSAID products without first discussing it with their healthcare provider. Spironolactone  Warn patient to report dehydration, hypotension, or symptoms of worsening renal function.  Counsel female patient to report gynecomastia.  Side effects may include diarrhea, nausea, vomiting, abdominal cramping, fever, leg cramps, lethargy, mental confusion, decreased libido, irregular menses, and rash. Suspension: Tell patient to take drug  consistently with respect to food, either before or after a meal.  Advise patient to avoid potassium supplements and foods containing high levels of potassium, including salt substitutes.  DRUGS TO AVOID IN HEART FAILURE  Drug or Class Mechanism  Analgesics . NSAIDs . COX-2 inhibitors . Glucocorticoids  Sodium and water retention, increased systemic vascular resistance, decreased response to diuretics   Diabetes Medications . Metformin . Thiazolidinediones o Rosiglitazone (Avandia) o Pioglitazone (Actos) . DPP4 Inhibitors o Saxagliptin (Onglyza) o Sitagliptin (Januvia)   Lactic acidosis Possible calcium channel blockade   Unknown  Antiarrhythmics . Class I  o Flecainide o Disopyramide . Class  III o Sotalol . Other o Dronedarone  Negative inotrope, proarrhythmic   Proarrhythmic, beta blockade  Negative inotrope  Antihypertensives . Alpha Blockers o Doxazosin . Calcium Channel Blockers o Diltiazem o Verapamil o Nifedipine . Central Alpha Adrenergics o Moxonidine . Peripheral Vasodilators o Minoxidil  Increases renin and aldosterone  Negative inotrope    Possible sympathetic withdrawal  Unknown  Anti-infective . Itraconazole . Amphotericin B  Negative inotrope Unknown  Hematologic . Anagrelide . Cilostazol   Possible inhibition of PD IV Inhibition of PD III causing arrhythmias  Neurologic/Psychiatric . Stimulants . Anti-Seizure Drugs o Carbamazepine o Pregabalin . Antidepressants o Tricyclics o Citalopram . Parkinsons o Bromocriptine o Pergolide o Pramipexole . Antipsychotics o Clozapine . Antimigraine o Ergotamine o Methysergide . Appetite suppressants . Bipolar o Lithium  Peripheral alpha and beta agonist activity  Negative inotrope and chronotrope Calcium channel blockade  Negative inotrope, proarrhythmic Dose-dependent QT prolongation  Excessive serotonin activity/valvular damage Excessive serotonin activity/valvular damage Unknown  IgE mediated hypersensitivy, calcium channel blockade  Excessive serotonin activity/valvular damage Excessive serotonin activity/valvular damage Valvular damage  Direct myofibrillar degeneration, adrenergic stimulation  Antimalarials . Chloroquine . Hydroxychloroquine Intracellular inhibition of lysosomal enzymes  Urologic Agents . Alpha Blockers o Doxazosin o Prazosin o Tamsulosin o Terazosin  Increased renin and aldosterone  Adapted from Page RL, et al. "Drugs That May Cause or Exacerbate Heart Failure: A Scientific Statement from the American Heart  Association." Circulation 2016; 134:e32-e69. DOI: 10.1161/CIR.0000000000000426   MEDICATION ADHERENCES TIPS AND STRATEGIES 1. Taking  medication as prescribed improves patient outcomes in heart failure (reduces hospitalizations, improves symptoms, increases survival) 2. Side effects of medications can be managed by decreasing doses, switching agents, stopping drugs, or adding additional therapy. Please let someone in the Heart Failure Clinic know if you have having bothersome side effects so we can modify your regimen. Do not alter your medication regimen without talking to [place].  3. Medication reminders can help patients remember to take drugs on time. If you are missing or forgetting doses you can try linking behaviors, using pill boxes, or an electronic reminder like an alarm on your phone or an app. Some people can also get automated phone calls as medication reminders.

## 2020-01-16 ENCOUNTER — Other Ambulatory Visit: Payer: Self-pay | Admitting: Cardiovascular Disease

## 2020-03-05 ENCOUNTER — Encounter: Payer: Self-pay | Admitting: Family

## 2020-03-05 ENCOUNTER — Other Ambulatory Visit: Payer: Self-pay

## 2020-03-05 ENCOUNTER — Ambulatory Visit: Payer: Managed Care, Other (non HMO) | Attending: Family | Admitting: Family

## 2020-03-05 VITALS — BP 136/75 | HR 62 | Resp 20 | Ht 66.0 in | Wt 281.5 lb

## 2020-03-05 DIAGNOSIS — E119 Type 2 diabetes mellitus without complications: Secondary | ICD-10-CM | POA: Diagnosis present

## 2020-03-05 DIAGNOSIS — R0683 Snoring: Secondary | ICD-10-CM | POA: Insufficient documentation

## 2020-03-05 DIAGNOSIS — Z7982 Long term (current) use of aspirin: Secondary | ICD-10-CM | POA: Diagnosis not present

## 2020-03-05 DIAGNOSIS — E785 Hyperlipidemia, unspecified: Secondary | ICD-10-CM | POA: Insufficient documentation

## 2020-03-05 DIAGNOSIS — Z880 Allergy status to penicillin: Secondary | ICD-10-CM | POA: Diagnosis not present

## 2020-03-05 DIAGNOSIS — Z79899 Other long term (current) drug therapy: Secondary | ICD-10-CM | POA: Diagnosis not present

## 2020-03-05 DIAGNOSIS — Z8349 Family history of other endocrine, nutritional and metabolic diseases: Secondary | ICD-10-CM | POA: Insufficient documentation

## 2020-03-05 DIAGNOSIS — I110 Hypertensive heart disease with heart failure: Secondary | ICD-10-CM | POA: Diagnosis not present

## 2020-03-05 DIAGNOSIS — M1990 Unspecified osteoarthritis, unspecified site: Secondary | ICD-10-CM | POA: Insufficient documentation

## 2020-03-05 DIAGNOSIS — Z8249 Family history of ischemic heart disease and other diseases of the circulatory system: Secondary | ICD-10-CM | POA: Insufficient documentation

## 2020-03-05 DIAGNOSIS — I10 Essential (primary) hypertension: Secondary | ICD-10-CM

## 2020-03-05 DIAGNOSIS — I5032 Chronic diastolic (congestive) heart failure: Secondary | ICD-10-CM | POA: Diagnosis not present

## 2020-03-05 LAB — GLUCOSE, CAPILLARY: Glucose-Capillary: 103 mg/dL — ABNORMAL HIGH (ref 70–99)

## 2020-03-05 NOTE — Progress Notes (Signed)
Patient ID: Heather Norman, female    DOB: [birth year], 44 y.o.   MRN: [id number]  HPI  Heather Norman is a 44 y/o female with a history of arthritis, DM, hyperlipidemia, HTN, morbid obesity and chronic heart failure.  Echo report from 06/16/2019 reviewed and showed an EF of 60-65% along with moderately increased LVH. Echo report from 05/16/17 reviewed and showed an EF of 55-60% along with mild MR. Reviewed echo report from 05/15/14 which showed an EF of 60-65% along with mild MR and normal PA pressure.  Was in the ED 10/16/19 due to HF exacerbation. Given IV lasix, symptoms improved and she was released.   She presents today for a follow-up visit with a chief complaint of moderate fatigue upon minimal exertion. She describes this as chronic in nature having been present for several years. She has associated shortness of breath, intermittent dizziness and difficulty sleeping along with this. She denies any abdominal distention, palpitations, pedal edema, chest pain, wheezing, cough or weight gain.   Past Medical History:  Diagnosis Date  . (HFpEF) heart failure with preserved ejection fraction ([facility])    a. 04/2017 Echo: EF 55-60%, no rwma, Mild MR, mildly dil LA. Nl RV fxn; b. 06/2019 Echo: EF 60-65%, mod LVH. Gr2 DD. Mildly dil LA.  Marland Kitchen Arthritis    kness/hands - no meds  . CHF (congestive heart failure) ([facility])   . Diabetes mellitus without complication ([facility])   . Dyspnea on exertion   . Headache(784.0)    otc med prn  . Heart murmur   . Hyperlipidemia    no meds since pregnancy  . Hypertension   . Morbid obesity (HCC)    Past Surgical History:  Procedure Laterality Date  . CERVICAL CERCLAGE  06/01/2011   Procedure: CERCLAGE CERVICAL;  Surgeon: Philip Aspen, DO;  Location: [facility];  Service: Gynecology;  Laterality: N/A;  REQUESTING PORTABLE ULTARSOUND AT BEDSIDE PT'S EDC:12/02/2011  . CERVICAL CERCLAGE  06/01/2011   Procedure: CERCLAGE CERVICAL;  Surgeon: Philip Aspen, DO;  Location: [facility]  ORS;  Service: Gynecology;  Laterality: N/A;  REQUESTING PORTABLE ULTARSOUND AT BEDSIDE PT'S EDC:12/02/2011  . endosocpy    . svd     x 2  . tab     x 1  . TUBAL LIGATION     Family History  Problem Relation Age of Onset  . Stroke Mother   . Hypertension Mother   . Hyperlipidemia Mother   . Hypertension Father    Social History   Tobacco Use  . Smoking status: Never Smoker  . Smokeless tobacco: Never Used  Substance Use Topics  . Alcohol use: No   Allergies  Allergen Reactions  . Penicillins Anaphylaxis    Has patient had a PCN reaction causing immediate rash, facial/tongue/throat swelling, SOB or lightheadedness with hypotension: Yes Has patient had a PCN reaction causing severe rash involving mucus membranes or skin necrosis: No Has patient had a PCN reaction that required hospitalization: No Has patient had a PCN reaction occurring within the last 10 years: No If all of the above answers are "NO", then may proceed with Cephalosporin use.   Prior to Admission medications   Medication Sig Start Date End Date Taking? Authorizing Provider  albuterol (PROVENTIL HFA;VENTOLIN HFA) 108 (90 Base) MCG/ACT inhaler Inhale 2 puffs into the lungs every 4 (four) hours as needed for wheezing or shortness of breath. q4-6 h PRN for shortness of breath   Yes [provider]  aspirin EC 81 MG tablet  Take 81 mg by mouth daily.   Yes [provider]  carvedilol (COREG) 25 MG tablet Take 25 mg by mouth 2 (two) times daily with a meal.   Yes [provider]  fexofenadine (ALLEGRA) 180 MG tablet Take 180 mg by mouth daily.   Yes [provider]  fluticasone (FLONASE) 50 MCG/ACT nasal spray Place 1 spray into both nostrils daily as needed for allergies or rhinitis.   Yes [provider]  JARDIANCE 10 MG TABS tablet Take 10 mg by mouth daily. 10/10/19  Yes [provider]  loratadine (CLARITIN) 10 MG tablet Take 10 mg by mouth daily. Takes daily    Yes [provider]  oxybutynin (DITROPAN) 5 MG tablet Take 5 mg by mouth 2 (two) times daily. 12/25/17  Yes [provider]  potassium chloride 20 MEQ/15ML (10%) SOLN Take 58.5 mLs (78 mEq total) by mouth daily. Take 20ml daily 12/12/19  Yes ,  A, FNP  sacubitril-valsartan (ENTRESTO) 49-51 MG Take 1 tablet by mouth 2 (two) times daily. 10/24/19  Yes [name], FNP  spironolactone (ALDACTONE) 25 MG tablet TAKE 1 TABLET(25 MG) BY MOUTH DAILY 01/16/20  Yes Gollan, Tollie Pizza, MD  torsemide (DEMADEX) 20 MG tablet TAKE 2 TABLETS(40 MG) BY MOUTH TWICE DAILY 01/16/20  Yes Gollan, Tollie Pizza, MD    Review of Systems  Constitutional: Positive for fatigue (easily). Negative for appetite change.  HENT: Negative for congestion, postnasal drip and sore throat.   Eyes: Negative.   Respiratory: Positive for shortness of breath ("getting better"). Negative for cough and wheezing.   Cardiovascular: Negative for chest pain, palpitations and leg swelling.  Gastrointestinal: Negative for abdominal distention and abdominal pain.  Endocrine: Negative.   Genitourinary: Negative.   Musculoskeletal: Negative for back pain and neck pain.  Skin: Negative for rash and wound.  Allergic/Immunologic: Negative.   Neurological: Positive for dizziness (intermittent). Negative for light-headedness.  Hematological: Negative for adenopathy. Does not bruise/bleed easily.  Psychiatric/Behavioral: Positive for sleep disturbance. Negative for dysphoric mood. The patient is not nervous/anxious.    Vitals:   03/05/20 1140  BP: 136/75  Pulse: 62  Resp: 20  SpO2: 98%  Weight: 281 lb 8 oz (127.7 kg)  Height: 5\' 6"  (1.676 m)   Wt Readings from Last 3 Encounters:  03/05/20 281 lb 8 oz (127.7 kg)  01/02/20 285 lb 8 oz (129.5 kg)  12/12/19 286 lb 8 oz (130 kg)   Lab Results  Component Value Date   CREATININE 1.29 (H) 12/12/2019   CREATININE 1.42 (H) 11/14/2019   CREATININE 1.45 (H) 11/06/2019      Physical Exam Vitals and nursing note reviewed.  Constitutional:      Appearance: She is well-developed.  HENT:     Head: Normocephalic and atraumatic.  Neck:     Vascular: No JVD.  Cardiovascular:     Rate and Rhythm: Normal rate and regular rhythm.  Pulmonary:     Effort: Pulmonary effort is normal. No respiratory distress.     Breath sounds: No wheezing or rales.  Abdominal:     Palpations: Abdomen is soft.     Tenderness: There is no abdominal tenderness.  Musculoskeletal:     Cervical back: Normal range of motion and neck supple.     Right lower leg: No tenderness. No edema.     Left lower leg: No tenderness. No edema.  Skin:    General: Skin is warm and dry.  Neurological:  General: No focal deficit present.     Mental Status: She is alert and oriented to person, place, and time.  Psychiatric:        Mood and Affect: Mood normal.        Behavior: Behavior normal.    Assessment & Plan:   1: Chronic heart failure with preserved ejection fraction with structural changes- - NYHA class III - euvolemic today - weighing daily; reminded to call for an overnight weight gain of >2 pounds or a weekly weight gain of >5 pounds - weight down 4 pounds from last visit 2 months ago - not adding salt to her food and is trying to follow a low sodium diet - encouraged her to be as active as possible - saw cardiology [facility]) 11/06/19 & returns October - BNP 10/16/19 was 30.0 - reports receiving both COVID vaccines  2: HTN- - BP looks good today - sees PCP [facility])  - BMP from 12/12/19 reviewed and shows sodium 141, potassium 4.0, creatinine 1.29 and GFR 58  3: Diabetes-  - glucose here today was 103 - has tried cutting back on eating watermelon - last A1c was 6.6% in 2014  4: Snoring- - She snores every night and sometimes wakes up gasping - She is as tired when she wakes up as when she went to bed - patient still has to call sleep lab  back   Patient did not bring her medications nor a list. Each medication was verbally reviewed with the patient and she was encouraged to bring the bottles to every visit to confirm accuracy of list.  Return in 4 months or sooner for any questions/problems before then.

## 2020-03-05 NOTE — Patient Instructions (Addendum)
Continue weighing daily and call for an overnight weight gain of > 2 pounds or a weekly weight gain of >5 pounds.   Email me your test results:  [email or web address]

## 2020-04-12 ENCOUNTER — Emergency Department: Payer: Managed Care, Other (non HMO)

## 2020-04-12 ENCOUNTER — Other Ambulatory Visit: Payer: Self-pay

## 2020-04-12 ENCOUNTER — Emergency Department
Admission: EM | Admit: 2020-04-12 | Discharge: 2020-04-12 | Disposition: A | Discharge status: Home / Self Care | Payer: Managed Care, Other (non HMO) | Attending: Emergency Medicine | Admitting: Emergency Medicine

## 2020-04-12 ENCOUNTER — Telehealth: Payer: Self-pay | Admitting: Cardiovascular Disease

## 2020-04-12 DIAGNOSIS — Z79899 Other long term (current) drug therapy: Secondary | ICD-10-CM | POA: Diagnosis not present

## 2020-04-12 DIAGNOSIS — I5032 Chronic diastolic (congestive) heart failure: Secondary | ICD-10-CM | POA: Diagnosis not present

## 2020-04-12 DIAGNOSIS — E119 Type 2 diabetes mellitus without complications: Secondary | ICD-10-CM | POA: Diagnosis not present

## 2020-04-12 DIAGNOSIS — K2100 Gastro-esophageal reflux disease with esophagitis, without bleeding: Secondary | ICD-10-CM | POA: Insufficient documentation

## 2020-04-12 DIAGNOSIS — Z7982 Long term (current) use of aspirin: Secondary | ICD-10-CM | POA: Insufficient documentation

## 2020-04-12 DIAGNOSIS — I110 Hypertensive heart disease with heart failure: Secondary | ICD-10-CM | POA: Diagnosis not present

## 2020-04-12 DIAGNOSIS — R079 Chest pain, unspecified: Secondary | ICD-10-CM | POA: Diagnosis present

## 2020-04-12 LAB — BASIC METABOLIC PANEL
Anion gap: 11 (ref 5–15)
BUN: 21 mg/dL — ABNORMAL HIGH (ref 6–20)
CO2: 27 mmol/L (ref 22–32)
Calcium: 8.8 mg/dL — ABNORMAL LOW (ref 8.9–10.3)
Chloride: 100 mmol/L (ref 98–111)
Creatinine, Ser: 1.31 mg/dL — ABNORMAL HIGH (ref 0.44–1.00)
GFR calc Af Amer: 57 mL/min — ABNORMAL LOW (ref >60)
GFR calc non Af Amer: 49 mL/min — ABNORMAL LOW (ref >60)
Glucose, Bld: 120 mg/dL — ABNORMAL HIGH (ref 70–99)
Potassium: 3.7 mmol/L (ref 3.5–5.1)
Sodium: 138 mmol/L (ref 135–145)

## 2020-04-12 LAB — CBC
HCT: 44.3 % (ref 36.0–46.0)
Hemoglobin: 14.5 g/dL (ref 12.0–15.0)
MCH: 28.7 pg (ref 26.0–34.0)
MCHC: 32.7 g/dL (ref 30.0–36.0)
MCV: 87.5 fL (ref 80.0–100.0)
Platelets: 352 10*3/uL (ref 150–400)
RBC: 5.06 MIL/uL (ref 3.87–5.11)
RDW: 15.1 % (ref 11.5–15.5)
WBC: 8.3 10*3/uL (ref 4.0–10.5)
nRBC: 0 % (ref 0.0–0.2)

## 2020-04-12 LAB — TROPONIN I (HIGH SENSITIVITY): Troponin I (High Sensitivity): 5 ng/L (ref <18)

## 2020-04-12 MED ORDER — PANTOPRAZOLE SODIUM 20 MG PO TBEC: 20.0000 mg | DELAYED_RELEASE_TABLET | Freq: Every day | ORAL | 1 refills | Status: DC

## 2020-04-12 NOTE — ED Provider Notes (Signed)
[facility] Emergency Department Provider Note  ____________________________________________   First MD Initiated Contact with Patient 04/12/20 1331     (approximate)  I have reviewed the triage vital signs and the nursing notes.   HISTORY  Chief Complaint Chest Pain   HPI Heather Norman is Norman 44 y.o. female presents to the ED with complaint of anterior chest pain that has occurred twice in the past week.  Patient states that pain has been intermittent and that she was notified by her watch that she had an irregular heartbeat.  Patient reports that she also had "lots of burping" but no shortness of breath, difficulty breathing or diaphoresis.  Patient currently denies any nausea, vomiting or shortness of breath.  She has in the past had reflux for which she is not taking any medication for at this time.  She states that her symptoms began before eating and not afterwards.  She states with the reflux she has Norman "sour taste" in her mouth.  She denies any Covid symptoms or exposure.  Patient has Norman history of CHF, hyperlipidemia, hypertension, heart failure that is being followed by Norman cardiologist.  Currently her only symptom is burping and reflux.  Her pain is 5 out of 10.         Past Medical History:  Diagnosis Date  . (HFpEF) heart failure with preserved ejection fraction (HCC)    Norman. 04/2017 Echo: EF 55-60%, no rwma, Mild MR, mildly dil LA. Nl RV fxn; b. 06/2019 Echo: EF 60-65%, mod LVH. Gr2 DD. Mildly dil LA.  Marland Kitchen Arthritis    kness/hands - no meds  . CHF (congestive heart failure) (HCC)   . Diabetes mellitus without complication (HCC)   . Dyspnea on exertion   . Headache(784.0)    otc med prn  . Heart murmur   . Hyperlipidemia    no meds since pregnancy  . Hypertension   . Morbid obesity [facility])     Patient Active Problem List   Diagnosis Date Noted  . Type 2 diabetes mellitus without complication, without long-term current use of insulin (HCC)  05/19/2018  . DM (diabetes mellitus) (HCC) 05/11/2017  . Snoring 05/11/2017  . Morbid obesity (HCC) 08/30/2015  . Chronic diastolic CHF (congestive heart failure) (HCC) 08/30/2015  . Essential hypertension 02/16/2015  . Elderly multigravida with antepartum condition or complication 05/22/2011    Past Surgical History:  Procedure Laterality Date  . CERVICAL CERCLAGE  06/01/2011   Procedure: CERCLAGE CERVICAL;  Surgeon: Philip Aspen, DO;  Location: [facility] ORS;  Service: Gynecology;  Laterality: N/Norman;  REQUESTING PORTABLE ULTARSOUND AT BEDSIDE PT'S EDC:12/02/2011  . CERVICAL CERCLAGE  06/01/2011   Procedure: CERCLAGE CERVICAL;  Surgeon: Philip Aspen, DO;  Location: [facility] ORS;  Service: Gynecology;  Laterality: N/Norman;  REQUESTING PORTABLE ULTARSOUND AT BEDSIDE PT'S EDC:12/02/2011  . endosocpy    . svd     x 2  . tab     x 1  . TUBAL LIGATION      Prior to Admission medications   Medication Sig Start Date End Date Taking? Authorizing Provider  albuterol (PROVENTIL HFA;VENTOLIN HFA) 108 (90 Base) MCG/ACT inhaler Inhale 2 puffs into the lungs every 4 (four) hours as needed for wheezing or shortness of breath. q4-6 h PRN for shortness of breath    [provider]  aspirin EC 81 MG tablet Take 81 mg by mouth daily.    [provider]  carvedilol (COREG) 25 MG tablet Take 25 mg  by mouth 2 (two) times daily with Norman meal.    [provider]  fexofenadine (ALLEGRA) 180 MG tablet Take 180 mg by mouth daily.    [provider]  fluticasone (FLONASE) 50 MCG/ACT nasal spray Place 1 spray into both nostrils daily as needed for allergies or rhinitis.    [provider]  JARDIANCE 10 MG TABS tablet Take 10 mg by mouth daily. 10/10/19   [provider]  loratadine (CLARITIN) 10 MG tablet Take 10 mg by mouth daily. Takes daily    [provider]  oxybutynin (DITROPAN) 5 MG tablet Take 5 mg by mouth 2 (two) times daily. 12/25/17   [provider]  pantoprazole (PROTONIX) 20 MG tablet Take 1 tablet (20 mg total) by mouth daily. 04/12/20   Heather Rumps, PA-C  potassium chloride 20 MEQ/15ML (10%) SOLN Take 58.5 mLs (78 mEq total) by mouth daily. Take 65ml daily 12/12/19   Heather Kindred A, FNP  sacubitril-valsartan (ENTRESTO) 49-51 MG Take 1 tablet by mouth 2 (two) times daily. 10/24/19   Heather Kindred A, FNP  spironolactone (ALDACTONE) 25 MG tablet TAKE 1 TABLET(25 MG) BY MOUTH DAILY 01/16/20   Heather Iba, MD  torsemide (DEMADEX) 20 MG tablet TAKE 2 TABLETS(40 MG) BY MOUTH TWICE DAILY 01/16/20   Heather Iba, MD    Allergies Penicillins  Family History  Problem Relation Age of Onset  . Stroke Mother   . Hypertension Mother   . Hyperlipidemia Mother   . Hypertension Father     Social History Social History   Tobacco Use  . Smoking status: Never Smoker  . Smokeless tobacco: Never Used  Substance Use Topics  . Alcohol use: No  . Drug use: No    Review of Systems Constitutional: No fever/chills Eyes: No visual changes. ENT: No sore throat. Cardiovascular: Positive for anterior chest pain. Respiratory: Denies shortness of breath. Gastrointestinal: No abdominal pain.  No nausea, no vomiting.  No diarrhea.  Positive for burping and reflux symptoms. Genitourinary: Negative for dysuria. Musculoskeletal: Negative for muscle aches. Skin: Negative for rash. Neurological: Negative for headaches, focal weakness or numbness. ____________________________________________   PHYSICAL EXAM:  VITAL SIGNS: ED Triage Vitals  Enc Vitals Group     BP 04/12/20 1048 119/68     Pulse Rate 04/12/20 1048 74     Resp 04/12/20 1048 17     Temp 04/12/20 1048 99 F (37.2 C)     Temp Source 04/12/20 1048 Oral     SpO2 04/12/20 1048 96 %     Weight 04/12/20 1052 284 lb (128.8 kg)     Height 04/12/20 1052 5\' 5"  (1.651 m)     Head Circumference --      Peak Flow --      Pain Score 04/12/20 1051 5     Pain Loc --      Pain  Edu? --      Excl. in GC? --     Constitutional: Alert and oriented. Well appearing and in no acute distress. Eyes: Conjunctivae are normal.  Head: Atraumatic. Nose: No congestion/rhinnorhea. Neck: No stridor.   Cardiovascular: Normal rate, regular rhythm. Grossly normal heart sounds.  Good peripheral circulation. Respiratory: Normal respiratory effort.  No retractions. Lungs CTAB. Gastrointestinal: Soft and nontender. No distention.  No epigastric or right upper quadrant tenderness on palpation.  Bowel sounds are normoactive x4 quadrants. Musculoskeletal: No lower extremity tenderness nor edema.  No joint effusions. Neurologic:  Normal speech and  language. No gross focal neurologic deficits are appreciated. No gait instability. Skin:  Skin is warm, dry and intact. No rash noted. Psychiatric: Mood and affect are normal. Speech and behavior are normal.  ____________________________________________   LABS (all labs ordered are listed, but only abnormal results are displayed)  Labs Reviewed  BASIC METABOLIC PANEL - Abnormal; Notable for the following components:      Result Value   Glucose, Bld 120 (*)    BUN 21 (*)    Creatinine, Ser 1.31 (*)    Calcium 8.8 (*)    GFR calc non Af Amer 49 (*)    GFR calc Af Amer 57 (*)    All other components within normal limits  CBC  TROPONIN I (HIGH SENSITIVITY)   ____________________________________________  EKG Sinus bradycardia with with PACs.  Left ventricular hypertrophy.  Patient has Norman ventricular rate of 52.  This was compared with EKGs done in various offices and in the emergency department.  Patient has had bradycardia and left ventricular hypertrophy on previous EKGs.  ____________________________________________  RADIOLOGY   Official radiology report(s): DG Chest 2 View  Result Date: 04/12/2020 CLINICAL DATA:  Chest pain. EXAM: CHEST - 2 VIEW COMPARISON:  10/16/2019 FINDINGS: The cardiac silhouette, mediastinal and hilar  contours are within normal limits and stable. The lungs are clear. No pleural effusions or pulmonary lesions. The bony thorax is intact. IMPRESSION: No acute cardiopulmonary findings. Electronically Signed   By: Rudie Meyer M.D.   On: 04/12/2020 11:26    ____________________________________________   PROCEDURES  Procedure(s) performed (including Critical Care):  Procedures   ____________________________________________   INITIAL IMPRESSION / ASSESSMENT AND PLAN / ED COURSE  As part of my medical decision making, I reviewed the following data within the electronic MEDICAL RECORD NUMBER Notes from prior ED visits and [place] Controlled Substance Database  44 year old female presents to the ED with complaint of anterior chest wall pain for 1 week intermittently along with frequent burping and sour reflux symptoms.  Patient currently is not taking any GERD medications but has in the past with the last time being 2 years ago.  She denies any abdominal pain and symptoms began before eating.  Troponin and other labs were reassuring.  Patient has had elevated BUN and creatinine in the past much higher than today's.  She is currently being followed by cardiology for CHF, hypertension and heart failure with Norman preserved ejection action.  Patient is reassured also that her chest x-ray was negative for any acute changes.  We will start her on Protonix 20 mg 1 daily and she is to follow-up with her PCP and also her cardiologist if any continued problems.  She is told to return to the emergency department immediately if any severe changes in her chest pain, any shortness of breath or difficulty breathing.   ____________________________________________   FINAL CLINICAL IMPRESSION(S) / ED DIAGNOSES  Final diagnoses:  Gastroesophageal reflux disease with esophagitis without hemorrhage     ED Discharge Orders         Ordered    pantoprazole (PROTONIX) 20 MG tablet  Daily        04/12/20 1345           *Please note:  PRICE PHILLABAUM was evaluated in Emergency Department on 04/12/2020 for the symptoms described in the history of present illness. She was evaluated in the context of the global COVID-19 pandemic, which necessitated consideration that the patient might be at risk for infection with the SARS-CoV-2  virus that causes COVID-19. Institutional protocols and algorithms that pertain to the evaluation of patients at risk for COVID-19 are in Norman state of rapid change based on information released by regulatory bodies including the CDC and federal and state organizations. These policies and algorithms were followed during the patient's care in the ED.  Some ED evaluations and interventions may be delayed as Norman result of limited staffing during and the pandemic.*   Note:  This document was prepared using Dragon voice recognition software and may include unintentional dictation errors.    Heather Rumps, PA-C 04/12/20 1515    Merwyn Katos, MD 04/12/20 [phone]

## 2020-04-12 NOTE — Telephone Encounter (Signed)
Pt c/o of Chest Pain: STAT if CP now or developed within 24 hours  1. Are you having CP right now? Yes 5-6/10 pain scale right now   2. Are you experiencing any other symptoms (ex. SOB, nausea, vomiting, sweating)?  Sob burping hurts on exertion and sitting up  but relieved when laying down   3. How long have you been experiencing CP? Last Monday getting worse   4. Is your CP continuous or coming and going? Comes and goes but today constant   5. Have you taken Nitroglycerin?  No but has taken morning     Thinks she may need to go to ed wanting to be seen today   ?

## 2020-04-12 NOTE — Telephone Encounter (Signed)
Called patient. She is currently in the [facility] Emergency room.  She's had an EKG done already. Advised her that is a great place for her to be. Encouraged her to be patient and complete the full cardiac workup would be best for her.  Then once we can confirm she is stable then she can follow up in office when appropriate.

## 2020-04-12 NOTE — ED Triage Notes (Signed)
Pt c/o intermittent chest pain since Saturday, states it felt like it was beating irregular. Pt is in NAD, denies any N/V/SOB

## 2020-04-12 NOTE — Discharge Instructions (Signed)
Follow-up with your primary care provider if any continued problems.  A prescription for Protonix was sent to your pharmacy.  This is 1 tablet a day.  On your discharge instructions there are food choices to help with your reflux problems.  Also sleeping with your head elevated at night and not eating late at night can also help with your symptoms.  Return to the emergency department if any severe worsening of your symptoms such as shortness of breath or difficulty breathing.

## 2020-04-13 NOTE — Progress Notes (Signed)
Office Visit    Patient Name: Heather Norman Date of Encounter: 04/14/2020  Primary Care Provider:  [facility] Primary Cardiologist:  Julien Nordmann, MD Electrophysiologist:  None   Chief Complaint    Heather Norman is a 44 y.o. female with a hx of HFpEF, DM2, HTN, HLD, obesity presents today for ED follow up.   Past Medical History    Past Medical History:  Diagnosis Date  . (HFpEF) heart failure with preserved ejection fraction (HCC)    a. 04/2017 Echo: EF 55-60%, no rwma, Mild MR, mildly dil LA. Nl RV fxn; b. 06/2019 Echo: EF 60-65%, mod LVH. Gr2 DD. Mildly dil LA.  Marland Kitchen Arthritis    kness/hands - no meds  . CHF (congestive heart failure) (HCC)   . Diabetes mellitus without complication (HCC)   . Dyspnea on exertion   . Headache(784.0)    otc med prn  . Heart murmur   . Hyperlipidemia    no meds since pregnancy  . Hypertension   . Morbid obesity (HCC)    Past Surgical History:  Procedure Laterality Date  . CERVICAL CERCLAGE  06/01/2011   Procedure: CERCLAGE CERVICAL;  Surgeon: Philip Aspen, DO;  Location: [facility]S;  Service: Gynecology;  Laterality: N/A;  REQUESTING PORTABLE ULTARSOUND AT BEDSIDE PT'S EDC:12/02/2011  . CERVICAL CERCLAGE  06/01/2011   Procedure: CERCLAGE CERVICAL;  Surgeon: Philip Aspen, DO;  Location: [facility];  Service: Gynecology;  Laterality: N/A;  REQUESTING PORTABLE ULTARSOUND AT BEDSIDE PT'S EDC:12/02/2011  . endosocpy    . svd     x 2  . tab     x 1  . TUBAL LIGATION      Allergies  Allergies  Allergen Reactions  . Penicillins Anaphylaxis    Has patient had a PCN reaction causing immediate rash, facial/tongue/throat swelling, SOB or lightheadedness with hypotension: Yes Has patient had a PCN reaction causing severe rash involving mucus membranes or skin necrosis: No Has patient had a PCN reaction that required hospitalization: No Has patient had a PCN reaction occurring within the last 10 years: No If  all of the above answers are "NO", then may proceed with Cephalosporin use.    History of Present Illness    Heather Norman is a 44 y.o. female with a hx of HFpEF, DM2, HTN, HLD, obesity last seen 03/05/20 by Clarisa Kindred, NP.  Most recent echocardiogram 06/2019 with normal LVEF, gr2DD. Seen in ED 10/16/19 due to HF exacerbation and given IV Lasix with symptom improvement. Seen in HF clinic 10/24/19 and was up 6 lbs though euvolemic on exam, she was transitioned from Valsartan to [place]. Seen in follow up 10/2019 and was tolerating change well. 12/08/19 her Torsemide was increased to 40mg  BID. 12/12/19 with pharmacy visit she was recommended to stop Amlodipine due to hypotension.   She was seen in ED 04/12/20 for chest discomfort. Treated as GERD and given Protonix. ED workup unremarkable: Chest x-ray with no acute findings, EKG showed normal sinus rhythm with LVH and infrequent PAC. HS-troponin negativex1.  Creatinine 1.31, GFR 57, hemoglobin 14.5, WBC 8.3, K3.7.  Presents today for follow-up after ED visit. Reports no recurrent chest pain since ED visit. Tells me she feels more short of breath. This is notable on exertion. Tells me it is not as bad when she lays down. Reports no wheeze. Endorses dry cough for "awhile". Reports a contact at work who has been ill. She does go in and out of coolers  at work which she attributes to some of her cough. Has not had take any cough medication.   Tells me last Monday the Apple Watch alerted her that she had irregular heart rhythm. Tells me she did not notice any symptoms. Tells me symptoms of chest pain started after her Apple Watch notified her of irregular heartbeat.  We reviewed that her EKG in the ED showed PACs.  She does endorse intermittent palpitations.  She had low blood pressure earlier this week with home BP reading 98/82.  The last time she episode of hypotension was in May.  Otherwise her BP has been routinely 130s over 80s.  She does endorse  increased fatigue.  She has been taking her medications as prescribed. Takes her Torsemide 40mg  twice daily. First dose in the morning and second dose about 7pm.   EKGs/Labs/Other Studies Reviewed:   The following studies were reviewed today:  Echo 06/2019 1. Left ventricular ejection fraction, by visual estimation, is 60 to  65%. The left ventricle has normal function. There is moderately increased  left ventricular hypertrophy.   2. Left ventricular diastolic parameters are consistent with Grade II  diastolic dysfunction (pseudonormalization).   3. Global right ventricle has normal systolic function.The right  ventricular size is normal. No increase in right ventricular wall  thickness.   4. Left atrial size was mildly dilated.   5. The mitral valve is normal in structure. No evidence of mitral valve  regurgitation. No evidence of mitral stenosis.   6. The tricuspid valve is normal in structure. Tricuspid valve  regurgitation is not demonstrated.   7. The aortic valve is normal in structure. Aortic valve regurgitation is  not visualized. No evidence of aortic valve sclerosis or stenosis.   8. TR signal is inadequate for assessing pulmonary artery systolic  pressure.   EKG:  EKG is ordered today.  The ekg ordered today demonstrates SB 59bpm with minimal voltage criteria for LVH. No acute ST/T wave changes.   Recent Labs: 10/16/2019: ALT 19; B Natriuretic Peptide 30.0 04/12/2020: BUN 21; Creatinine, Ser 1.31; Hemoglobin 14.5; Platelets 352; Potassium 3.7; Sodium 138  Recent Lipid Panel    Component Value Date/Time   CHOL 168 01/14/2015 1051   CHOL 199 12/13/2012 0106   TRIG 102 01/14/2015 1051   TRIG 84 12/13/2012 0106   HDL 42 01/14/2015 1051   HDL 52 12/13/2012 0106   CHOLHDL 4.0 01/14/2015 1051   VLDL 17 12/13/2012 0106   LDLCALC 106 (H) 01/14/2015 1051   LDLCALC 130 (H) 12/13/2012 0106    Home Medications   Current Meds  Medication Sig  . albuterol (PROVENTIL  HFA;VENTOLIN HFA) 108 (90 Base) MCG/ACT inhaler Inhale 2 puffs into the lungs every 4 (four) hours as needed for wheezing or shortness of breath. q4-6 h PRN for shortness of breath  . aspirin EC 81 MG tablet Take 81 mg by mouth daily.  . carvedilol (COREG) 25 MG tablet Take 25 mg by mouth 2 (two) times daily with a meal.  . fluticasone (FLONASE) 50 MCG/ACT nasal spray Place 1 spray into both nostrils daily as needed for allergies or rhinitis.  12/15/2012 JARDIANCE 10 MG TABS tablet Take 10 mg by mouth daily.  Marland Kitchen loratadine (CLARITIN) 10 MG tablet Take 10 mg by mouth daily. Takes daily  . oxybutynin (DITROPAN) 5 MG tablet Take 5 mg by mouth 2 (two) times daily.  . pantoprazole (PROTONIX) 20 MG tablet Take 1 tablet (20 mg total) by mouth daily.  Marland Kitchen  potassium chloride 20 MEQ/15ML (10%) SOLN Take 58.5 mLs (78 mEq total) by mouth daily. Take 14ml daily  . sacubitril-valsartan (ENTRESTO) 49-51 MG Take 1 tablet by mouth 2 (two) times daily.  Marland Kitchen spironolactone (ALDACTONE) 25 MG tablet TAKE 1 TABLET(25 MG) BY MOUTH DAILY  . torsemide (DEMADEX) 20 MG tablet TAKE 2 TABLETS(40 MG) BY MOUTH TWICE DAILY      Review of Systems    Review of Systems  Constitutional: Negative for chills, fever and malaise/fatigue.  Cardiovascular: Positive for dyspnea on exertion and palpitations. Negative for chest pain, leg swelling, near-syncope, orthopnea and syncope.  Respiratory: Positive for cough. Negative for shortness of breath and wheezing.   Gastrointestinal: Negative for nausea and vomiting.  Neurological: Negative for dizziness, light-headedness and weakness.   All other systems reviewed and are otherwise negative except as noted above.  Physical Exam    VS:  BP 98/70 (BP Location: Left Arm, Patient Position: Sitting, Cuff Size: Large)   Pulse (!) 59   Ht 5\' 5"  (1.651 m)   Wt 282 lb 9.6 oz (128.2 kg)   SpO2 98%   BMI 47.03 kg/m  , BMI Body mass index is 47.03 kg/m. GEN: Well nourished, overweight, well developed,  in no acute distress. HEENT: normal. Neck: Supple, no JVD, carotid bruits, or masses. Cardiac: RRR, no murmurs, rubs, or gallops. No clubbing, cyanosis, edema.  Radials/DP/PT 2+ and equal bilaterally.  Respiratory:  Respirations regular and unlabored, clear to auscultation bilaterally. GI: Soft, nontender, nondistended, BS + x 4. MS: No deformity or atrophy. Skin: Warm and dry, no rash. Neuro:  Strength and sensation are intact. Psych: Normal affect.  Assessment & Plan    1. Atypical chest pain -recent ED visit with atypical chest pain.  Symptoms were consistent with gas/GERD.  Resolved with belching and addition of Protonix.  No recurrent symptoms since ED discharge.  EKG today with no acute ST/T wave changes.  No indication for ischemic evaluation at this time.  2. HFpEF - 06/2019 EF 60-65% with gr2DD.  Euvolemic and well compensated exam.  Does endorse increased dyspnea on exertion associated with cough.  Concern for alternate etiologies such as viral illness as she has been exposed to sick contact at work.Continue present doses of Entresto, spironolactone, torsemide, Coreg.  Appreciate inclusion of Jardiance by PCP.  Continue low-sodium, heart healthy diet.  3. HTN - BP low normal today though reports no lightheadedness, dizziness, near syncope.  As her BP has been routinely 130s over 80s at home we will defer adjustment of her antihypertensive regimen.  Did encourage her to check her blood pressure before her afternoon dose of torsemide and reduce by half or hold dose for hypotension.  4. Palpitations -nonsymptomatic palpitations.  EKG in the ED with infrequent PAC.  She has been getting irregular heart rate reading by her apple watch.  14-day ZIO monitor placed today for assessment of palpitations and arrhythmia.  5. Daytime somnolence - Sleep study ordered by PCP.  She is continuing to work with insurance to get this covered.  6. DM2 -Continue to follow with PCP.  7. Obesity -weight  loss via diet and exercise encouraged.  Disposition: Follow up in 6 week(s) with Dr. 07/2019 or APP.    Mariah Milling, NP 04/14/2020, 11:58 AM

## 2020-04-14 ENCOUNTER — Encounter: Payer: Self-pay | Admitting: Family

## 2020-04-14 ENCOUNTER — Ambulatory Visit (INDEPENDENT_AMBULATORY_CARE_PROVIDER_SITE_OTHER): Payer: Managed Care, Other (non HMO) | Admitting: Family

## 2020-04-14 ENCOUNTER — Ambulatory Visit (INDEPENDENT_AMBULATORY_CARE_PROVIDER_SITE_OTHER): Payer: Managed Care, Other (non HMO)

## 2020-04-14 ENCOUNTER — Other Ambulatory Visit: Payer: Self-pay

## 2020-04-14 VITALS — BP 98/70 | HR 59 | Ht 65.0 in | Wt 282.6 lb

## 2020-04-14 DIAGNOSIS — I491 Atrial premature depolarization: Secondary | ICD-10-CM

## 2020-04-14 DIAGNOSIS — R002 Palpitations: Secondary | ICD-10-CM

## 2020-04-14 DIAGNOSIS — R400 Somnolence: Secondary | ICD-10-CM

## 2020-04-14 DIAGNOSIS — R0683 Snoring: Secondary | ICD-10-CM

## 2020-04-14 DIAGNOSIS — R0789 Other chest pain: Secondary | ICD-10-CM | POA: Diagnosis not present

## 2020-04-14 DIAGNOSIS — I5032 Chronic diastolic (congestive) heart failure: Secondary | ICD-10-CM

## 2020-04-14 DIAGNOSIS — E119 Type 2 diabetes mellitus without complications: Secondary | ICD-10-CM

## 2020-04-14 NOTE — Patient Instructions (Signed)
Medication Instructions:  No medication changes today.   *If you need a refill on your cardiac medications before your next appointment, please call your pharmacy*  Lab Work: No lab work today.   Testing/Procedures: Your EKG today shows normal sinus rhythm.  Your EKG in the ED showed an occasional early beat in the top chamber of your heart called PACs. These are very common and not dangerous.   Your physician has recommended that you wear a Zio monitor. This monitor is a medical device that records the heart's electrical activity. Doctors most often use these monitors to diagnose arrhythmias. Arrhythmias are problems with the speed or rhythm of the heartbeat. The monitor is a small device applied to your chest. You can wear one while you do your normal daily activities. While wearing this monitor if you have any symptoms to push the button and record what you felt. Once you have worn this monitor for the period of time provider prescribed (Usually 14 days), you will return the monitor device in the postage paid box. Once it is returned they will download the data collected and provide [place] with a report which the provider will then review and we will call you with those results. Important tips:  1. Avoid showering during the first 24 hours of wearing the monitor. 2. Avoid excessive sweating to help maximize wear time. 3. Do not submerge the device, no hot tubs, and no swimming pools. 4. Keep any lotions or oils away from the patch. 5. After 24 hours you may shower with the patch on. Take brief showers with your back facing the shower head.  6. Do not remove patch once it has been placed because that will interrupt data and decrease adhesive wear time. 7. Push the button when you have any symptoms and write down what you were feeling. 8. Once you have completed wearing your monitor, remove and place into box which has postage paid and place in your outgoing mailbox.  9. If for some reason you  have misplaced your box then call our office and we can provide another box and/or mail it off for you.        Follow-Up: At [facility], you and your health needs are our priority.  As part of our continuing mission to provide you with exceptional heart care, we have created designated Provider Care Teams.  These Care Teams include your primary Cardiologist (physician) and Advanced Practice Providers (APPs -  Physician Assistants and Nurse Practitioners) who all work together to provide you with the care you need, when you need it.  We recommend signing up for the patient portal called "MyChart".  Sign up information is provided on this After Visit Summary.  MyChart is used to connect with patients for Virtual Visits (Telemedicine).  Patients are able to view lab/test results, encounter notes, upcoming appointments, etc.  Non-urgent messages can be sent to your provider as well.   To learn more about what you can do with MyChart, go to [email or web address].    Your next appointment:   6 week(s)  The format for your next appointment:   In Person  Provider:    You may see Heather Nordmann, MD or one of the following Advanced Practice Providers on your designated Care Team:   Heather Ducking, NP  Heather Listen, PA-C  Heather Shields, NP  Heather Ivan, PA-C  Other Instructions   You can try Gas-X as needed for belching and gas symptoms.   Continue Protonix as recommended.  Your chest discomfort is very atypical for heart blockage and your EKG was reassuring.

## 2020-05-10 ENCOUNTER — Ambulatory Visit: Payer: Managed Care, Other (non HMO) | Admitting: Cardiovascular Disease

## 2020-05-23 ENCOUNTER — Other Ambulatory Visit: Payer: Self-pay | Admitting: Family

## 2020-05-23 DIAGNOSIS — I5032 Chronic diastolic (congestive) heart failure: Secondary | ICD-10-CM

## 2020-05-26 ENCOUNTER — Encounter: Payer: Self-pay | Admitting: Family

## 2020-05-26 ENCOUNTER — Ambulatory Visit (INDEPENDENT_AMBULATORY_CARE_PROVIDER_SITE_OTHER): Payer: Managed Care, Other (non HMO) | Admitting: Family

## 2020-05-26 ENCOUNTER — Other Ambulatory Visit: Payer: Self-pay

## 2020-05-26 VITALS — BP 120/72 | HR 81 | Ht 65.0 in | Wt 281.1 lb

## 2020-05-26 DIAGNOSIS — R002 Palpitations: Secondary | ICD-10-CM

## 2020-05-26 DIAGNOSIS — I10 Essential (primary) hypertension: Secondary | ICD-10-CM | POA: Diagnosis not present

## 2020-05-26 DIAGNOSIS — I493 Ventricular premature depolarization: Secondary | ICD-10-CM

## 2020-05-26 DIAGNOSIS — I5032 Chronic diastolic (congestive) heart failure: Secondary | ICD-10-CM | POA: Diagnosis not present

## 2020-05-26 DIAGNOSIS — E119 Type 2 diabetes mellitus without complications: Secondary | ICD-10-CM

## 2020-05-26 DIAGNOSIS — R0683 Snoring: Secondary | ICD-10-CM

## 2020-05-26 MED ORDER — CARVEDILOL 25 MG PO TABS: 37.5000 mg | ORAL_TABLET | Freq: Two times a day (BID) | ORAL | 1 refills | Status: DC

## 2020-05-26 NOTE — Progress Notes (Signed)
Office Visit    Patient Name: Heather Norman Date of Encounter: 05/26/2020  Primary Care Provider:  [facility] Primary Cardiologist:  Julien Nordmann, MD Electrophysiologist:  None   Chief Complaint    Heather Norman is a 44 y.o. female with a hx of HFpEF, DM2, HTN, HLD, obesity presents today for follow up after ZIO.  Past Medical History    Past Medical History:  Diagnosis Date  . (HFpEF) heart failure with preserved ejection fraction (HCC)    a. 04/2017 Echo: EF 55-60%, no rwma, Mild MR, mildly dil LA. Nl RV fxn; b. 06/2019 Echo: EF 60-65%, mod LVH. Gr2 DD. Mildly dil LA.  Marland Kitchen Arthritis    kness/hands - no meds  . CHF (congestive heart failure) (HCC)   . Diabetes mellitus without complication (HCC)   . Dyspnea on exertion   . Headache(784.0)    otc med prn  . Heart murmur   . Hyperlipidemia    no meds since pregnancy  . Hypertension   . Morbid obesity (HCC)    Past Surgical History:  Procedure Laterality Date  . CERVICAL CERCLAGE  06/01/2011   Procedure: CERCLAGE CERVICAL;  Surgeon: Philip Aspen, DO;  Location: [facility];  Service: Gynecology;  Laterality: N/A;  REQUESTING PORTABLE ULTARSOUND AT BEDSIDE PT'S EDC:12/02/2011  . CERVICAL CERCLAGE  06/01/2011   Procedure: CERCLAGE CERVICAL;  Surgeon: Philip Aspen, DO;  Location: [facility];  Service: Gynecology;  Laterality: N/A;  REQUESTING PORTABLE ULTARSOUND AT BEDSIDE PT'S EDC:12/02/2011  . endosocpy    . svd     x 2  . tab     x 1  . TUBAL LIGATION      Allergies  Allergies  Allergen Reactions  . Penicillins Anaphylaxis    Has patient had a PCN reaction causing immediate rash, facial/tongue/throat swelling, SOB or lightheadedness with hypotension: Yes Has patient had a PCN reaction causing severe rash involving mucus membranes or skin necrosis: No Has patient had a PCN reaction that required hospitalization: No Has patient had a PCN reaction occurring within the last 10 years:  No If all of the above answers are "NO", then may proceed with Cephalosporin use.    History of Present Illness    Heather Norman is a 44 y.o. female with a hx of HFpEF, DM2, HTN, HLD, obesity last seen 04/14/20.  Most recent echocardiogram 06/2019 with normal LVEF, gr2DD. Seen in ED 10/16/19 due to HF exacerbation and given IV Lasix with symptom improvement. Seen in HF clinic 10/24/19 and was up 6 lbs though euvolemic on exam, she was transitioned from Valsartan to [place]. Seen in follow up 10/2019 and was tolerating change well. 12/08/19 her Torsemide was increased to 40mg  BID. 12/12/19 with pharmacy visit she was recommended to stop Amlodipine due to hypotension.   She was seen in ED 04/12/20 for chest discomfort. Treated as GERD and given Protonix. ED workup unremarkable: Chest x-ray with no acute findings, EKG showed normal sinus rhythm with LVH and infrequent PAC. HS-troponin negativex1.  Creatinine 1.31, GFR 57, hemoglobin 14.5, WBC 8.3, K3.7.  Seen in follow up 04/14/20. No recurrent chest discomfort. Noted notification of irregular heart rhythm on Apple Watch. Reviewed ED EKG showing PACs. Reported intermittent palpitations. 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 Triggered events were associated with NSR.   Presents today for follow up. She has two  daughters at home and her son is at [employer] studying education. Works full time as well and endorses this schedule can be understandably stressful.   No recurrent chest discomfort since starting Pantoprazole. Reports palpitations are intermittent and overall not bothersome. Her dyspnea on exertion is stable at her baseline and BP well controlled at home.    EKGs/Labs/Other Studies Reviewed:   The following studies were reviewed today:  Echo 06/2019 1. Left ventricular ejection fraction, by visual estimation, is 60 to  65%. The left ventricle  has normal function. There is moderately increased  left ventricular hypertrophy.   2. Left ventricular diastolic parameters are consistent with Grade II  diastolic dysfunction (pseudonormalization).   3. Global right ventricle has normal systolic function.The right  ventricular size is normal. No increase in right ventricular wall  thickness.   4. Left atrial size was mildly dilated.   5. The mitral valve is normal in structure. No evidence of mitral valve  regurgitation. No evidence of mitral stenosis.   6. The tricuspid valve is normal in structure. Tricuspid valve  regurgitation is not demonstrated.   7. The aortic valve is normal in structure. Aortic valve regurgitation is  not visualized. No evidence of aortic valve sclerosis or stenosis.   8. TR signal is inadequate for assessing pulmonary artery systolic  pressure.   EKG:  EKG is ordered today.  The ekg ordered today demonstrates SR 81 bpm with frequent PVC.  Recent Labs: 10/16/2019: ALT 19; B Natriuretic Peptide 30.0 04/12/2020: BUN 21; Creatinine, Ser 1.31; Hemoglobin 14.5; Platelets 352; Potassium 3.7; Sodium 138  Recent Lipid Panel    Component Value Date/Time   CHOL 168 01/14/2015 1051   CHOL 199 12/13/2012 0106   TRIG 102 01/14/2015 1051   TRIG 84 12/13/2012 0106   HDL 42 01/14/2015 1051   HDL 52 12/13/2012 0106   CHOLHDL 4.0 01/14/2015 1051   VLDL 17 12/13/2012 0106   LDLCALC 106 (H) 01/14/2015 1051   LDLCALC 130 (H) 12/13/2012 0106    Home Medications   Current Meds  Medication Sig  . albuterol (PROVENTIL HFA;VENTOLIN HFA) 108 (90 Base) MCG/ACT inhaler Inhale 2 puffs into the lungs every 4 (four) hours as needed for wheezing or shortness of breath. q4-6 h PRN for shortness of breath  . aspirin EC 81 MG tablet Take 81 mg by mouth daily.  . carvedilol (COREG) 25 MG tablet Take 25 mg by mouth 2 (two) times daily with a meal.  . ENTRESTO 49-51 MG TAKE 1 TABLET BY MOUTH TWICE DAILY  . fluticasone (FLONASE) 50  MCG/ACT nasal spray Place 1 spray into both nostrils daily as needed for allergies or rhinitis.  Marland Kitchen JARDIANCE 10 MG TABS tablet Take 10 mg by mouth daily.  Marland Kitchen loratadine (CLARITIN) 10 MG tablet Take 10 mg by mouth daily. Takes daily  . oxybutynin (DITROPAN) 5 MG tablet Take 5 mg by mouth 2 (two) times daily.  . pantoprazole (PROTONIX) 20 MG tablet Take 1 tablet (20 mg total) by mouth daily.  . potassium chloride 20 MEQ/15ML (10%) SOLN Take 58.5 mLs (78 mEq total) by mouth daily. Take 42ml daily  . spironolactone (ALDACTONE) 25 MG tablet TAKE 1 TABLET(25 MG) BY MOUTH DAILY  . torsemide (DEMADEX) 20 MG tablet TAKE 2 TABLETS(40 MG) BY MOUTH TWICE DAILY      Review of Systems   All other systems reviewed and are otherwise negative except as noted above.  Physical Exam    VS:  BP 120/72 (BP  Location: Left Arm, Patient Position: Sitting, Cuff Size: Large)   Pulse 81   Ht 5\' 5"  (1.651 m)   Wt 281 lb 2 oz (127.5 kg)   SpO2 98%   BMI 46.78 kg/m  , BMI Body mass index is 46.78 kg/m. GEN: Well nourished, overweight, well developed, in no acute distress. HEENT: normal. Neck: Supple, no JVD, carotid bruits, or masses. Cardiac: RRR, no murmurs, rubs, or gallops. No clubbing, cyanosis, edema.  Radials/DP/PT 2+ and equal bilaterally.  Respiratory:  Respirations regular and unlabored, clear to auscultation bilaterally. GI: Soft, nontender, nondistended, BS + x 4. MS: No deformity or atrophy. Skin: Warm and dry, no rash. Neuro:  Strength and sensation are intact. Psych: Normal affect.  Assessment & Plan    1. Atypical chest pain -recent ED visit with atypical chest pain.  Symptoms were consistent with gas/GERD.  Resolved with belching and addition of Protonix.  No recurrent symptoms since ED discharge. No indication for ischemic evaluation at this time.  2. HFpEF - 06/2019 EF 60-65% with gr2DD.  Euvolemic and well compensated exam. Continue present doses of Entresto, spironolactone, torsemide,  Coreg.  Appreciate inclusion of Jardiance by PCP.  Continue low-sodium, heart healthy diet.  3. HTN - BP well controlled. Continue current antihypertensive regimen.   4. Palpitations / PVC  - ZIO with frequent PVC (12.5%) though triggered events associated with NSR. Reports palpitations are intermittent and nonbothersome. To reduce burden, increase Coreg to 37.5mg  twice daily.   5. Daytime somnolence - Sleep study ordered by PCP.  She is continuing to work with insurance to get this covered.  6. DM2 -Continue to follow with PCP.  7. Obesity -weight loss via diet and exercise encouraged.  Disposition: Follow up in December with HF clinic and in 4-6 months with Dr. January or APP.    Mariah Milling, NP 05/26/2020, 2:49 PM

## 2020-05-26 NOTE — Patient Instructions (Addendum)
Medication Instructions:  Your physician has recommended you make the following change in your medication:   CHANGE Carvedilol (Coreg) to 37.5mg  (1.5 tablets) twice daily *this will help to lower how often those early beats called PVC's are happening  *If you need a refill on your cardiac medications before your next appointment, please call your pharmacy*   Lab Work: Your physician recommends that you return for lab work at your convenience to [facility] for lipid panel and CMP  Testing/Procedures: Your EKG today shows normal sinus rhythm with occasional early beat in the bottom chamber of the heart called PVC's (premature ventricular contractions).   Follow-Up: At [facility], you and your health needs are our priority.  As part of our continuing mission to provide you with exceptional heart care, we have created designated Provider Care Teams.  These Care Teams include your primary Cardiologist (physician) and Advanced Practice Providers (APPs -  Physician Assistants and Nurse Practitioners) who all work together to provide you with the care you need, when you need it.  We recommend signing up for the patient portal called "MyChart".  Sign up information is provided on this After Visit Summary.  MyChart is used to connect with patients for Virtual Visits (Telemedicine).  Patients are able to view lab/test results, encounter notes, upcoming appointments, etc.  Non-urgent messages can be sent to your provider as well.   To learn more about what you can do with MyChart, go to [email or web address].    Your next appointment:   4-6 month(s)  The format for your next appointment:   In Person  Provider:   You may see Julien Nordmann, MD or one of the following Advanced Practice Providers on your designated Care Team:    Nicolasa Ducking, NP  Eula Listen, PA-C  Gillian Shields, NP  Marisue Ivan, PA-C  Cadence Fransico Michael, [place]    Other Instructions  Premature Ventricular  Contraction  A premature ventricular contraction (PVC) is a common kind of irregular heartbeat (arrhythmia). These contractions are extra heartbeats that start in the ventricles of the heart and occur too early in the normal sequence. During the PVC, the heart's normal electrical pathway is not used, so the beat is shorter and less effective. In most cases, these contractions come and go and do not require treatment. What are the causes? Common causes of the condition include:  Smoking.  Drinking alcohol.  Certain medicines.  Some illegal drugs.  Stress.  Caffeine. Certain medical conditions can also cause PVCs:  Heart failure.  Heart attack, or coronary artery disease.  Heart valve problems.  Changes in minerals in the blood (electrolytes).  Low blood oxygen levels or high carbon dioxide levels. In many cases, the cause of this condition is not known. What are the signs or symptoms? The main symptom of this condition is fast or skipped heartbeats (palpitations). Other symptoms include:  Chest pain.  Shortness of breath.  Feeling tired.  Dizziness.  Difficulty exercising. In some cases, there are no symptoms. How is this diagnosed? This condition may be diagnosed based on:  Your medical history.  A physical exam. During the exam, the health care provider will check for irregular heartbeats.  Tests, such as: ? An ECG (electrocardiogram) to monitor the electrical activity of your heart. ? An ambulatory cardiac monitor. This device records your heartbeats for 24 hours or more. ? Stress tests to see how exercise affects your heart rhythm and blood supply. ? An echocardiogram. This test uses sound waves (ultrasound) to  produce an image of your heart. ? An electrophysiology study (EPS). This test checks for electrical problems in your heart. How is this treated? Treatment for this condition depends on any underlying conditions, the type of PVCs that you are having,  and how much the symptoms are interfering with your daily life. Possible treatments include:  Avoiding things that cause premature contractions (triggers). These include caffeine and alcohol.  Taking medicines if symptoms are severe or if the extra heartbeats are frequent.  Getting treatment for underlying conditions that cause PVCs.  Having an implantable cardioverter defibrillator (ICD), if you are at risk for a serious arrhythmia. The ICD is a small device that is inserted into your chest to monitor your heartbeat. When it senses an irregular heartbeat, it sends a shock to bring the heartbeat back to normal.  Having a procedure to destroy the portion of the heart tissue that sends out abnormal signals (catheter ablation). In some cases, no treatment is required. Follow these instructions at home: Lifestyle  Do not use any products that contain nicotine or tobacco, such as cigarettes, e-cigarettes, and chewing tobacco. If you need help quitting, ask your health care provider.  Do not use illegal drugs.  Exercise regularly. Ask your health care provider what type of exercise is safe for you.  Try to get at least 7-9 hours of sleep each night, or as much as recommended by your health care provider.  Find healthy ways to manage stress. Avoid stressful situations when possible. Alcohol use  Do not drink alcohol if: ? Your health care provider tells you not to drink. ? You are pregnant, may be pregnant, or are planning to become pregnant. ? Alcohol triggers your episodes.  If you drink alcohol: ? Limit how much you use to:  0-1 drink a day for women.  0-2 drinks a day for men.  Be aware of how much alcohol is in your drink. In the [place] one drink equals one 12 oz bottle of beer (355 mL), one 5 oz glass of wine (148 mL), or one 1 oz glass of hard liquor (44 mL). General instructions  Take over-the-counter and prescription medicines only as told by your health care  provider.  If caffeine triggers episodes of PVC, do not eat, drink, or use anything with caffeine in it.  Keep all follow-up visits as told by your health care provider. This is important. Contact a health care provider if you:  Feel palpitations. Get help right away if you:  Have chest pain.  Have shortness of breath.  Have sweating for no reason.  Have nausea and vomiting.  Become light-headed or you faint. Summary  A premature ventricular contraction (PVC) is a common kind of irregular heartbeat (arrhythmia).  In most cases, these contractions come and go and do not require treatment.  You may need to wear an ambulatory cardiac monitor. This records your heartbeats for 24 hours or more.  Treatment depends on any underlying conditions, the type of PVCs that you are having, and how much the symptoms are interfering with your daily life. This information is not intended to replace advice given to you by your health care provider. Make sure you discuss any questions you have with your health care provider. Document Revised: 04/11/2018 Document Reviewed: 04/11/2018 Elsevier Patient Education  2020 [employer].

## 2020-05-27 ENCOUNTER — Encounter: Payer: Self-pay | Admitting: Family

## 2020-06-04 ENCOUNTER — Other Ambulatory Visit: Payer: Self-pay

## 2020-06-05 ENCOUNTER — Other Ambulatory Visit
Admission: RE | Admit: 2020-06-05 | Discharge: 2020-06-05 | Disposition: A | Discharge status: Home / Self Care | Payer: Managed Care, Other (non HMO) | Attending: Family | Admitting: Family

## 2020-06-05 DIAGNOSIS — I5032 Chronic diastolic (congestive) heart failure: Secondary | ICD-10-CM | POA: Insufficient documentation

## 2020-06-05 DIAGNOSIS — I493 Ventricular premature depolarization: Secondary | ICD-10-CM | POA: Insufficient documentation

## 2020-06-05 LAB — COMPREHENSIVE METABOLIC PANEL
ALT: 26 U/L (ref 0–44)
AST: 22 U/L (ref 15–41)
Albumin: 3.6 g/dL (ref 3.5–5.0)
Alkaline Phosphatase: 106 U/L (ref 38–126)
Anion gap: 9 (ref 5–15)
BUN: 22 mg/dL — ABNORMAL HIGH (ref 6–20)
CO2: 27 mmol/L (ref 22–32)
Calcium: 8.9 mg/dL (ref 8.9–10.3)
Chloride: 103 mmol/L (ref 98–111)
Creatinine, Ser: 1.37 mg/dL — ABNORMAL HIGH (ref 0.44–1.00)
GFR, Estimated: 49 mL/min — ABNORMAL LOW (ref >60)
Glucose, Bld: 120 mg/dL — ABNORMAL HIGH (ref 70–99)
Potassium: 4.3 mmol/L (ref 3.5–5.1)
Sodium: 139 mmol/L (ref 135–145)
Total Bilirubin: 0.8 mg/dL (ref 0.3–1.2)
Total Protein: 7.6 g/dL (ref 6.5–8.1)

## 2020-06-05 LAB — LIPID PANEL
Cholesterol: 219 mg/dL — ABNORMAL HIGH (ref 0–200)
HDL: 48 mg/dL (ref >40)
LDL Cholesterol: 144 mg/dL — ABNORMAL HIGH (ref 0–99)
Total CHOL/HDL Ratio: 4.6 RATIO
Triglycerides: 133 mg/dL (ref <150)
VLDL: 27 mg/dL (ref 0–40)

## 2020-06-07 ENCOUNTER — Telehealth: Payer: Self-pay | Admitting: *Deleted

## 2020-06-07 DIAGNOSIS — I10 Essential (primary) hypertension: Secondary | ICD-10-CM

## 2020-06-07 DIAGNOSIS — I5032 Chronic diastolic (congestive) heart failure: Secondary | ICD-10-CM

## 2020-06-07 DIAGNOSIS — E785 Hyperlipidemia, unspecified: Secondary | ICD-10-CM

## 2020-06-07 MED ORDER — ATORVASTATIN CALCIUM 20 MG PO TABS: 20.0000 mg | ORAL_TABLET | Freq: Every day | ORAL | 3 refills | Status: DC

## 2020-06-07 NOTE — Telephone Encounter (Signed)
Spoke to pt. Notified of lab results and Heather Norman's recc to start Atorvastatin 20mg  daily and to repeat CMP/lipid panel at [facility] in 8 weeks. Rx sent to [facility] per pt request. Pt verbalized understanding and states she will have labs drawn the first week of January 2022.

## 2020-06-07 NOTE — Telephone Encounter (Signed)
-----   Message from Alver Sorrow, NP sent at 06/07/2020  7:46 AM EST ----- Stable kidney function. Normal electrolytes and liver function. Total cholesterol not at goal of <200 and LDL (bad cholesterol) not at goal of <100. Recommend addition of Atorvastatin 20mg  daily. A 'statin' is recommended for control of cholesterol in those with diabetes. Repeat CMP/lipid panel at [facility] in 8 weeks.

## 2020-07-07 ENCOUNTER — Telehealth: Payer: Self-pay | Admitting: Cardiovascular Disease

## 2020-07-07 NOTE — Telephone Encounter (Signed)
Patient c/o Palpitations:  High priority if patient c/o lightheadedness, shortness of breath, or chest pain  1) How long have you had palpitations/irregular HR/ Afib? Are you having the symptoms now? Irregular HR, past two days caught by apple watch   2) Are you currently experiencing lightheadedness, SOB or CP? Chest pain   3) Do you have a history of afib (atrial fibrillation) or irregular heart rhythm? Began in September   4) Have you checked your BP or HR? (document readings if available): 56 bpm at 9:46 am, 62 at 9:17, 67 at 9:27,  63 at 6:43 am   5) Are you experiencing any other symptoms? burping

## 2020-07-07 NOTE — Telephone Encounter (Signed)
Spoke with patient to review her symptoms. She states that she has had irregular heart rates and does not have option to print or send strips. Inquired if she had any blood pressures which she has not been doing. Instructed her to monitor blood pressures 2 hours after medications. She then mentioned that she had chest pain as well. Inquired if she pushed on her chest did it hurt more and she said yes. Suggested that she try ibuprofen since this would be muscle pain and not her heart. We had long discussion about PVC's and how some monitors for heart rate will not accurately read due to those PVC's. Also discussed caffeine drinks that can make it worse. She drinks Dr. Pat Kocher all day and reviewed that can make those worse. Reviewed information in detail and she verbalized understanding. Reviewed that Luther Parody did review chart and since she has appointment on Friday to make sure and review these symptoms with that appointment. She verbalized understanding of our conversation, agreement with plan, and had no further questions at this time.

## 2020-07-07 NOTE — Telephone Encounter (Signed)
Agree with plan as discussed with RN and patient.   She has known history of frequent PVC which is likely why her apple watch is capturing and reading his regular heartbeat.  She will monitor in September with no evidence of atrial fibrillation.  At last clinic visit we increase her dose of Coreg to 37.5 mg twice daily to reduce PVC burden.  She has been taking as directed.  She has appointment Friday with Inetta Fermo in the heart failure clinic and will route to her as FYI.    Alver Sorrow, NP

## 2020-07-08 NOTE — Progress Notes (Signed)
Patient ID: Heather Norman, female    DOB: 1975-10-22, 44 y.o.   MRN: [id number]  HPI  [name] is a 44 y/o female with a history of arthritis, DM, hyperlipidemia, HTN, morbid obesity and chronic heart failure.  Echo report from 06/16/2019 reviewed and showed an EF of 60-65% along with moderately increased LVH. Echo report from 05/16/17 reviewed and showed an EF of 55-60% along with mild MR. Reviewed echo report from 05/15/14 which showed an EF of 60-65% along with mild MR and normal PA pressure.  Was in the ED 04/12/20 due to GERD where she was evaluated and released.   She presents today for a follow-up visit with a chief complaint of moderate fatigue upon minimal exertion. She describes this as chronic in nature having been present for several years. She has associated shortness of breath, right should pain and difficulty sleeping along with this. She denies any dizziness, abdominal distention, palpitations, pedal edema, chest pain, wheezing, cough or weight gain.   Hasn't drank any more Dr. Pat Kocher in the last 2 days since cardiology instructed her not to. She said that was pretty much all she drank and she just stopped them. Hasn't noticed any palpitations and doesn't think her apple watch has picked up on anything since she stopped drinking the soda  Past Medical History:  Diagnosis Date  . (HFpEF) heart failure with preserved ejection fraction (HCC)    a. 04/2017 Echo: EF 55-60%, no rwma, Mild MR, mildly dil LA. Nl RV fxn; b. 06/2019 Echo: EF 60-65%, mod LVH. Gr2 DD. Mildly dil LA.  Heather Norman Arthritis    kness/hands - no meds  . CHF (congestive heart failure) (HCC)   . Diabetes mellitus without complication (HCC)   . Dyspnea on exertion   . Headache(784.0)    otc med prn  . Heart murmur   . Hyperlipidemia    no meds since pregnancy  . Hypertension   . Morbid obesity (HCC)    Past Surgical History:  Procedure Laterality Date  . CERVICAL CERCLAGE  06/01/2011   Procedure: CERCLAGE  CERVICAL;  Surgeon: Philip Aspen, DO;  Location: [facility];  Service: Gynecology;  Laterality: N/A;  REQUESTING PORTABLE ULTARSOUND AT BEDSIDE PT'S EDC:12/02/2011  . CERVICAL CERCLAGE  06/01/2011   Procedure: CERCLAGE CERVICAL;  Surgeon: Philip Aspen, DO;  Location: [facility];  Service: Gynecology;  Laterality: N/A;  REQUESTING PORTABLE ULTARSOUND AT BEDSIDE PT'S EDC:12/02/2011  . endosocpy    . svd     x 2  . tab     x 1  . TUBAL LIGATION     Family History  Problem Relation Age of Onset  . Stroke Mother   . Hypertension Mother   . Hyperlipidemia Mother   . Hypertension Father    Social History   Tobacco Use  . Smoking status: Never Smoker  . Smokeless tobacco: Never Used  Substance Use Topics  . Alcohol use: No   Allergies  Allergen Reactions  . Penicillins Anaphylaxis    Has patient had a PCN reaction causing immediate rash, facial/tongue/throat swelling, SOB or lightheadedness with hypotension: Yes Has patient had a PCN reaction causing severe rash involving mucus membranes or skin necrosis: No Has patient had a PCN reaction that required hospitalization: No Has patient had a PCN reaction occurring within the last 10 years: No If all of the above answers are "NO", then may proceed with Cephalosporin use.   Prior to Admission medications   Medication Sig Start Date End  Date Taking? Authorizing Provider  atorvastatin (LIPITOR) 20 MG tablet Take 1 tablet (20 mg total) by mouth daily. 06/07/20 09/05/20 Yes Alver Sorrow, NP  carvedilol (COREG) 25 MG tablet Take 1.5 tablets (37.5 mg total) by mouth 2 (two) times daily with a meal. 05/26/20  Yes Alver Sorrow, NP  ENTRESTO 49-51 MG TAKE 1 TABLET BY MOUTH TWICE DAILY 05/24/20  Yes [name], FNP  fluticasone (FLONASE) 50 MCG/ACT nasal spray Place 1 spray into both nostrils daily as needed for allergies or rhinitis.   Yes [provider]  JARDIANCE 10 MG TABS tablet Take 10 mg by mouth daily. 10/10/19  Yes  [provider]  loratadine (CLARITIN) 10 MG tablet Take 10 mg by mouth daily. Takes daily   Yes [provider]  oxybutynin (DITROPAN) 5 MG tablet Take 5 mg by mouth 2 (two) times daily. 12/25/17  Yes [provider]  pantoprazole (PROTONIX) 20 MG tablet Take 1 tablet (20 mg total) by mouth daily. 04/12/20  Yes [name], PA-C  spironolactone (ALDACTONE) 25 MG tablet TAKE 1 TABLET(25 MG) BY MOUTH DAILY 01/16/20  Yes Gollan, Tollie Pizza, MD  torsemide (DEMADEX) 20 MG tablet TAKE 2 TABLETS(40 MG) BY MOUTH TWICE DAILY 01/16/20  Yes Gollan, Tollie Pizza, MD  albuterol (PROVENTIL HFA;VENTOLIN HFA) 108 (90 Base) MCG/ACT inhaler Inhale 2 puffs into the lungs every 4 (four) hours as needed for wheezing or shortness of breath. q4-6 h PRN for shortness of breath Patient not taking: Reported on 07/09/2020    [provider]  aspirin EC 81 MG tablet Take 81 mg by mouth daily. Patient not taking: Reported on 07/09/2020    [provider]  potassium chloride 20 MEQ/15ML (10%) SOLN Take 58.5 mLs (78 mEq total) by mouth daily. Take 44ml daily 07/09/20   Delma Freeze, FNP    Review of Systems  Constitutional: Positive for fatigue (easily). Negative for appetite change.  HENT: Negative for congestion, postnasal drip and sore throat.   Eyes: Negative.   Respiratory: Positive for shortness of breath ("getting better"). Negative for cough and wheezing.   Cardiovascular: Negative for chest pain, palpitations and leg swelling.  Gastrointestinal: Negative for abdominal distention and abdominal pain.  Endocrine: Negative.   Genitourinary: Negative.   Musculoskeletal: Positive for arthralgias (right shoulder). Negative for back pain and neck pain.  Skin: Negative for rash and wound.  Allergic/Immunologic: Negative.   Neurological: Negative for dizziness and light-headedness.  Hematological: Negative for adenopathy. Does not bruise/bleed easily.   Psychiatric/Behavioral: Positive for sleep disturbance. Negative for dysphoric mood. The patient is not nervous/anxious.    Vitals:   07/09/20 1204  BP: (!) 100/56  Pulse: 76  Resp: 18  SpO2: 100%  Weight: 279 lb (126.6 kg)  Height: 5\' 5"  (1.651 m)   Wt Readings from Last 3 Encounters:  07/09/20 279 lb (126.6 kg)  05/26/20 281 lb 2 oz (127.5 kg)  04/14/20 282 lb 9.6 oz (128.2 kg)   Lab Results  Component Value Date   CREATININE 1.37 (H) 06/05/2020   CREATININE 1.31 (H) 04/12/2020   CREATININE 1.29 (H) 12/12/2019    Physical Exam Vitals and nursing note reviewed.  Constitutional:      Appearance: She is well-developed.  HENT:     Head: Normocephalic and atraumatic.  Neck:     Vascular: No JVD.  Cardiovascular:     Rate and Rhythm: Normal rate and regular rhythm.  Pulmonary:     Effort: Pulmonary effort  is normal. No respiratory distress.     Breath sounds: No wheezing or rales.  Abdominal:     Palpations: Abdomen is soft.     Tenderness: There is no abdominal tenderness.  Musculoskeletal:     Cervical back: Normal range of motion and neck supple.     Right lower leg: No tenderness. No edema.     Left lower leg: No tenderness. No edema.  Skin:    General: Skin is warm and dry.  Neurological:     General: No focal deficit present.     Mental Status: She is alert and oriented to person, place, and time.  Psychiatric:        Mood and Affect: Mood normal.        Behavior: Behavior normal.    Assessment & Plan:   1: Chronic heart failure with preserved ejection fraction with structural changes- - NYHA class III - euvolemic today - weighing daily; reminded to call for an overnight weight gain of >2 pounds or a weekly weight gain of >5 pounds - weight down 2 pounds from last visit 4 months ago - not adding salt to her food and is trying to follow a low sodium diet - encouraged her to be as active as possible - current BP does not allow for entresto titration -  BNP 10/16/19 was 30.0 - reports receiving both COVID vaccines; has not gotten covid booster or flu vaccine yet  2: HTN- - BP looks good today - sees PCP [facility])  - BMP from 06/05/20 reviewed and shows sodium 139, potassium 4.3, creatinine 1.37 and GFR 49  3: Diabetes-  - A1c was 6.6% in 2014  4: PVC's- - saw cardiology Dan Humphreys) 05/26/20 & carvedilol was increased to 37.5mg  BID - has worn Zio monitor which showed predominantly NSR with frequent PVC's - hasn't drank any Dr Reino Kent in the last 2 days and doesn't think her apple watch has picked up on anything   Medication bottles reviewed.   Return in 6 months or sooner for any questions/problems before then.

## 2020-07-09 ENCOUNTER — Encounter: Payer: Self-pay | Admitting: Family

## 2020-07-09 ENCOUNTER — Other Ambulatory Visit: Payer: Self-pay

## 2020-07-09 ENCOUNTER — Ambulatory Visit: Payer: Managed Care, Other (non HMO) | Attending: Family | Admitting: Family

## 2020-07-09 VITALS — BP 100/56 | HR 76 | Resp 18 | Ht 65.0 in | Wt 279.0 lb

## 2020-07-09 DIAGNOSIS — I493 Ventricular premature depolarization: Secondary | ICD-10-CM

## 2020-07-09 DIAGNOSIS — Z8349 Family history of other endocrine, nutritional and metabolic diseases: Secondary | ICD-10-CM | POA: Insufficient documentation

## 2020-07-09 DIAGNOSIS — Z880 Allergy status to penicillin: Secondary | ICD-10-CM | POA: Insufficient documentation

## 2020-07-09 DIAGNOSIS — Z8249 Family history of ischemic heart disease and other diseases of the circulatory system: Secondary | ICD-10-CM | POA: Insufficient documentation

## 2020-07-09 DIAGNOSIS — I110 Hypertensive heart disease with heart failure: Secondary | ICD-10-CM | POA: Insufficient documentation

## 2020-07-09 DIAGNOSIS — Z7982 Long term (current) use of aspirin: Secondary | ICD-10-CM | POA: Diagnosis not present

## 2020-07-09 DIAGNOSIS — I10 Essential (primary) hypertension: Secondary | ICD-10-CM

## 2020-07-09 DIAGNOSIS — I5032 Chronic diastolic (congestive) heart failure: Secondary | ICD-10-CM | POA: Diagnosis present

## 2020-07-09 DIAGNOSIS — Z79899 Other long term (current) drug therapy: Secondary | ICD-10-CM | POA: Diagnosis not present

## 2020-07-09 DIAGNOSIS — E119 Type 2 diabetes mellitus without complications: Secondary | ICD-10-CM | POA: Diagnosis not present

## 2020-07-09 DIAGNOSIS — K219 Gastro-esophageal reflux disease without esophagitis: Secondary | ICD-10-CM | POA: Insufficient documentation

## 2020-07-09 DIAGNOSIS — E785 Hyperlipidemia, unspecified: Secondary | ICD-10-CM | POA: Diagnosis not present

## 2020-07-09 MED ORDER — POTASSIUM CHLORIDE 20 MEQ/15ML (10%) PO SOLN
78.0000 meq | Freq: Every day | ORAL | 5 refills | Status: DC
Start: 2020-07-09 — End: 2022-02-06

## 2020-07-09 NOTE — Patient Instructions (Signed)
Continue weighing daily and call for an overnight weight gain of > 2 pounds or a weekly weight gain of >5 pounds. 

## 2020-08-07 ENCOUNTER — Ambulatory Visit: Payer: Managed Care, Other (non HMO) | Attending: Internal Medicine

## 2020-08-07 DIAGNOSIS — Z23 Encounter for immunization: Secondary | ICD-10-CM

## 2020-08-07 NOTE — Progress Notes (Signed)
   Covid-19 Vaccination Clinic  Name:  Heather Norman    MRN: [id number] DOB: [birth year]  08/07/2020  Heather Norman was observed post Covid-19 immunization for 30 minutes based on pre-vaccination screening without incident. She was provided with Vaccine Information Sheet and instruction to access the V-Safe system.   Heather Norman was instructed to call 911 with any severe reactions post vaccine: Marland Kitchen Difficulty breathing  . Swelling of face and throat  . A fast heartbeat  . A bad rash all over body  . Dizziness and weakness   Immunizations Administered    Name Date Dose VIS Date Route   Pfizer COVID-19 Vaccine 08/07/2020 11:22 AM 0.3 mL 05/19/2020 Intramuscular   Manufacturer: [employer], [employer]   Lot: [id number]   NDC: [address]-2

## 2020-09-21 ENCOUNTER — Telehealth: Payer: Self-pay

## 2020-09-21 ENCOUNTER — Other Ambulatory Visit: Payer: Self-pay

## 2020-09-21 NOTE — Telephone Encounter (Signed)
  No Prior Authorization is required for [employer] Key: Laury Axon help? Call us at [phone] Status Carvedilol 25MG  tablets Form PA Form [phone] NCPDP) Per Misbah (pharmacist tech) with [facility]

## 2020-09-22 NOTE — Telephone Encounter (Signed)
Received fax from [facility] stating Carvedilol PA was denied unless supportive information requested was sent.  Completed clinical information on form. Attached MD notes and labs. Faxed to [facility] @ [phone]. Placed in [employer] for PA.   Reference # [id number]

## 2020-09-26 NOTE — Progress Notes (Signed)
Date:  09/26/2020   ID:  Heather Norman, DOB 04/18/1976, MRN [id number]  Patient Location:  [address] [place] [address]   Provider location:   [facility], [employer] office  PCP:  [facility]  Cardiologist:  Mariah Milling, [facility]  Chief Complaint  Patient presents with  . Follow-up    Follow up and c/o not feeling well with swelling in right arm and leg. Medications verbally reviewed with patient.      History of Present Illness:    Heather Norman is a 45 y.o. female  past medical history of morbid obesity, 280 pounds diabetes type 2 hypertension,  chronic diastolic CHF,  who presents for routine office visit for her diastolic CHF and shortness of breath.  last seen in clinic 04/2018 Seen by one of our providers: 03/2020   echocardiogram 06/2019 with normal LVEF, gr2DD.  ED 10/16/19 due to HF exacerbation and given IV Lasix with symptom improvement.  12/08/19 her Torsemide was increased to 40mg  BID ED 04/12/20 for chest discomfort. Treated as GERD   Knee pain on right Weight stable Previously trouble with left knee  Continues to have Right arm hurting, tingling reinjured push/pulling cart  No regular exercise program Does not feel PVCs,  Other issues include sleep disorder Lots of snoring, daytime somnolence  Previously had urinary incontinence issues Continues to have issues, oxybutynin not helping May have overflow incontinence  Labs reviewed Total chol 219 CR 1.37 HGBA1C 6.7, was ordered  Other past medical history reviewed 11/2017 ER for hypertensive urgency and volume overload.  This occurred in the setting of dietary indiscretion.  Her weight was 278 pounds that day and at that time she was on Lasix 40 mg twice daily.   Given IV lasix and released  02/2018:ER visit  leg swelling, hypertensive at 175/112.  dietary indiscretions, frequently eating [employer] sandwiches for  lunch Given IV lasix and released   Prior CV studies:   The following studies were reviewed today:    Past Medical History:  Diagnosis Date  . (HFpEF) heart failure with preserved ejection fraction (HCC)    a. 04/2017 Echo: EF 55-60%, no rwma, Mild MR, mildly dil LA. Nl RV fxn; b. 06/2019 Echo: EF 60-65%, mod LVH. Gr2 DD. Mildly dil LA.  07/2019 Arthritis    kness/hands - no meds  . CHF (congestive heart failure) (HCC)   . Diabetes mellitus without complication (HCC)   . Dyspnea on exertion   . Headache(784.0)    otc med prn  . Heart murmur   . Hyperlipidemia    no meds since pregnancy  . Hypertension   . Morbid obesity (HCC)    Past Surgical History:  Procedure Laterality Date  . CERVICAL CERCLAGE  06/01/2011   Procedure: CERCLAGE CERVICAL;  Surgeon: 13/07/2010, DO;  Location: [facility];  Service: Gynecology;  Laterality: N/A;  REQUESTING PORTABLE ULTARSOUND AT BEDSIDE PT'S EDC:12/02/2011  . CERVICAL CERCLAGE  06/01/2011   Procedure: CERCLAGE CERVICAL;  Surgeon: 13/07/2010, DO;  Location: [facility];  Service: Gynecology;  Laterality: N/A;  REQUESTING PORTABLE ULTARSOUND AT BEDSIDE PT'S EDC:12/02/2011  . endosocpy    . svd     x 2  . tab     x 1  . TUBAL LIGATION       No outpatient medications have been marked as taking for the 09/27/20 encounter (Appointment) with 09/29/20, MD.  Allergies:   Penicillins   Social History   Tobacco Use  . Smoking status: Never Smoker  . Smokeless tobacco: Never Used  Vaping Use  . Vaping Use: Never used  Substance Use Topics  . Alcohol use: No  . Drug use: No     Current Outpatient Medications on File Prior to Visit  Medication Sig Dispense Refill  . albuterol (PROVENTIL HFA;VENTOLIN HFA) 108 (90 Base) MCG/ACT inhaler Inhale 2 puffs into the lungs every 4 (four) hours as needed for wheezing or shortness of breath. q4-6 h PRN for shortness of breath (Patient not taking: Reported on 07/09/2020)    . aspirin EC 81 MG  tablet Take 81 mg by mouth daily. (Patient not taking: Reported on 07/09/2020)    . atorvastatin (LIPITOR) 20 MG tablet Take 1 tablet (20 mg total) by mouth daily. 90 tablet 3  . carvedilol (COREG) 25 MG tablet Take 1.5 tablets (37.5 mg total) by mouth 2 (two) times daily with a meal. 270 tablet 1  . ENTRESTO 49-51 MG TAKE 1 TABLET BY MOUTH TWICE DAILY 60 tablet 5  . fluticasone (FLONASE) 50 MCG/ACT nasal spray Place 1 spray into both nostrils daily as needed for allergies or rhinitis.    Marland Kitchen JARDIANCE 10 MG TABS tablet Take 10 mg by mouth daily.    Marland Kitchen loratadine (CLARITIN) 10 MG tablet Take 10 mg by mouth daily. Takes daily    . oxybutynin (DITROPAN) 5 MG tablet Take 5 mg by mouth 2 (two) times daily.    . pantoprazole (PROTONIX) 20 MG tablet Take 1 tablet (20 mg total) by mouth daily. 30 tablet 1  . potassium chloride 20 MEQ/15ML (10%) SOLN Take 58.5 mLs (78 mEq total) by mouth daily. Take 71ml daily 1800 mL 5  . spironolactone (ALDACTONE) 25 MG tablet TAKE 1 TABLET(25 MG) BY MOUTH DAILY 90 tablet 3  . torsemide (DEMADEX) 20 MG tablet TAKE 2 TABLETS(40 MG) BY MOUTH TWICE DAILY 360 tablet 3   No current facility-administered medications on file prior to visit.     Family Hx: The patient's family history includes Hyperlipidemia in her mother; Hypertension in her father and mother; Stroke in her mother.  ROS:   Please see the history of present illness.    Review of Systems  Constitutional: Negative.   Respiratory: Positive for shortness of breath.   Cardiovascular: Negative.   Gastrointestinal: Negative.   Musculoskeletal: Negative.   Neurological: Negative.   Psychiatric/Behavioral: Negative.   All other systems reviewed and are negative.    Labs/Other Tests and Data Reviewed:    Recent Labs: 10/16/2019: B Natriuretic Peptide 30.0 04/12/2020: Hemoglobin 14.5; Platelets 352 06/05/2020: ALT 26; BUN 22; Creatinine, Ser 1.37; Potassium 4.3; Sodium 139   Recent Lipid Panel Lab Results   Component Value Date/Time   CHOL 219 (H) 06/05/2020 09:14 AM   CHOL 168 01/14/2015 10:51 AM   CHOL 199 12/13/2012 01:06 AM   TRIG 133 06/05/2020 09:14 AM   TRIG 84 12/13/2012 01:06 AM   HDL 48 06/05/2020 09:14 AM   HDL 42 01/14/2015 10:51 AM   HDL 52 12/13/2012 01:06 AM   CHOLHDL 4.6 06/05/2020 09:14 AM   LDLCALC 144 (H) 06/05/2020 09:14 AM   LDLCALC 106 (H) 01/14/2015 10:51 AM   LDLCALC 130 (H) 12/13/2012 01:06 AM    Wt Readings from Last 3 Encounters:  07/09/20 279 lb (126.6 kg)  05/26/20 281 lb 2 oz (127.5 kg)  04/14/20 282 lb 9.6 oz (128.2 kg)  Exam:    Vital Signs:  BP (!) 126/96 (BP Location: Left Arm, Patient Position: Sitting, Cuff Size: Large)   Pulse 78   Ht 5\' 5"  (1.651 m)   Wt 283 lb (128.4 kg)   SpO2 98%   BMI 47.09 kg/m   Constitutional:  oriented to person, place, and time. No distress.  HENT:  Head: Grossly normal Eyes:  no discharge. No scleral icterus.  Neck: No JVD, no carotid bruits  Cardiovascular: Regular rate and rhythm, no murmurs appreciated Pulmonary/Chest: Clear to auscultation bilaterally, no wheezes or rails Abdominal: Soft.  no distension.  no tenderness.  Musculoskeletal: Normal range of motion Neurological:  normal muscle tone. Coordination normal. No atrophy Skin: Skin warm and dry Psychiatric: normal affect, pleasant  ASSESSMENT & PLAN:    Chronic diastolic CHF (congestive heart failure) (HCC) We have encouraged continued exercise, careful diet management in an effort to lose weight. Continue torsemide 40 twice daily Continue other medications including Entresto, spironolactone, Coreg  Frequent PVCs Noted on monitor, symptoms improved with higher dose carvedilol  Type 2 diabetes mellitus without complication, without long-term current use of insulin (HCC) Hemoglobin A1c less than 7 per her records, Strict diet recommended, walking program  Morbid obesity (HCC) Recommended low carbohydrate diet  Essential  hypertension Blood pressure elevated in the setting of 10 pound weight gain, poor diet Diuretic changes as above should help her blood pressure  Diarrhea, unspecified type Stable  Right knee pain Likely arthritis, recommend follow-up with orthopedics   Total encounter time more than 25 minutes  Greater than 50% was spent in counseling and coordination of care with the patient    Signed, , MD  09/26/2020 4:25 PM    [facility] [address], [place], [place]

## 2020-09-27 ENCOUNTER — Other Ambulatory Visit: Payer: Self-pay

## 2020-09-27 ENCOUNTER — Encounter: Payer: Self-pay | Admitting: Cardiovascular Disease

## 2020-09-27 ENCOUNTER — Ambulatory Visit (INDEPENDENT_AMBULATORY_CARE_PROVIDER_SITE_OTHER): Payer: Managed Care, Other (non HMO) | Admitting: Cardiovascular Disease

## 2020-09-27 VITALS — BP 126/96 | HR 78 | Ht 65.0 in | Wt 283.0 lb

## 2020-09-27 DIAGNOSIS — I5032 Chronic diastolic (congestive) heart failure: Secondary | ICD-10-CM | POA: Diagnosis not present

## 2020-09-27 DIAGNOSIS — E785 Hyperlipidemia, unspecified: Secondary | ICD-10-CM | POA: Diagnosis not present

## 2020-09-27 DIAGNOSIS — I10 Essential (primary) hypertension: Secondary | ICD-10-CM

## 2020-09-27 DIAGNOSIS — E119 Type 2 diabetes mellitus without complications: Secondary | ICD-10-CM | POA: Diagnosis not present

## 2020-09-27 NOTE — Patient Instructions (Addendum)
Medication Instructions:  No changes  If you need a refill on your cardiac medications before your next appointment, please call your pharmacy.    Lab work: No new labs needed   If you have labs (blood work) drawn today and your tests are completely normal, you will receive your results only by: . MyChart Message (if you have MyChart) OR . A paper copy in the mail If you have any lab test that is abnormal or we need to change your treatment, we will call you to review the results.   Testing/Procedures: No new testing needed   Follow-Up: At [facility], you and your health needs are our priority.  As part of our continuing mission to provide you with exceptional heart care, we have created designated Provider Care Teams.  These Care Teams include your primary Cardiologist (physician) and Advanced Practice Providers (APPs -  Physician Assistants and Nurse Practitioners) who all work together to provide you with the care you need, when you need it.  . You will need a follow up appointment in 6 months, APP ok  . Providers on your designated Care Team:   . Christopher Berge, NP . Ryan Dunn, PA-C . Jacquelyn Visser, PA-C  Any Other Special Instructions Will Be Listed Below (If Applicable).  COVID-19 Vaccine Information can be found at: [email or web address] For questions related to vaccine distribution or appointments, please email [email or web address] or call [phone].     

## 2020-10-06 ENCOUNTER — Other Ambulatory Visit: Payer: Self-pay | Admitting: Physician Assistant

## 2020-10-06 DIAGNOSIS — Z1231 Encounter for screening mammogram for malignant neoplasm of breast: Secondary | ICD-10-CM

## 2020-10-11 ENCOUNTER — Emergency Department
Admission: EM | Admit: 2020-10-11 | Discharge: 2020-10-11 | Disposition: A | Discharge status: Home / Self Care | Payer: Managed Care, Other (non HMO) | Attending: Emergency Medicine | Admitting: Emergency Medicine

## 2020-10-11 ENCOUNTER — Encounter: Payer: Self-pay | Admitting: Emergency Medicine

## 2020-10-11 ENCOUNTER — Emergency Department: Payer: Managed Care, Other (non HMO)

## 2020-10-11 ENCOUNTER — Other Ambulatory Visit: Payer: Self-pay

## 2020-10-11 DIAGNOSIS — I509 Heart failure, unspecified: Secondary | ICD-10-CM

## 2020-10-11 DIAGNOSIS — Z7982 Long term (current) use of aspirin: Secondary | ICD-10-CM | POA: Diagnosis not present

## 2020-10-11 DIAGNOSIS — I5033 Acute on chronic diastolic (congestive) heart failure: Secondary | ICD-10-CM | POA: Diagnosis not present

## 2020-10-11 DIAGNOSIS — R0602 Shortness of breath: Secondary | ICD-10-CM | POA: Diagnosis present

## 2020-10-11 DIAGNOSIS — E119 Type 2 diabetes mellitus without complications: Secondary | ICD-10-CM | POA: Diagnosis not present

## 2020-10-11 DIAGNOSIS — Z79899 Other long term (current) drug therapy: Secondary | ICD-10-CM | POA: Insufficient documentation

## 2020-10-11 DIAGNOSIS — I110 Hypertensive heart disease with heart failure: Secondary | ICD-10-CM | POA: Insufficient documentation

## 2020-10-11 LAB — CBC
HCT: 40.7 % (ref 36.0–46.0)
Hemoglobin: 13.5 g/dL (ref 12.0–15.0)
MCH: 29 pg (ref 26.0–34.0)
MCHC: 33.2 g/dL (ref 30.0–36.0)
MCV: 87.3 fL (ref 80.0–100.0)
Platelets: 313 10*3/uL (ref 150–400)
RBC: 4.66 MIL/uL (ref 3.87–5.11)
RDW: 14.6 % (ref 11.5–15.5)
WBC: 9.4 10*3/uL (ref 4.0–10.5)
nRBC: 0 % (ref 0.0–0.2)

## 2020-10-11 LAB — BASIC METABOLIC PANEL
Anion gap: 7 (ref 5–15)
BUN: 14 mg/dL (ref 6–20)
CO2: 27 mmol/L (ref 22–32)
Calcium: 8.5 mg/dL — ABNORMAL LOW (ref 8.9–10.3)
Chloride: 105 mmol/L (ref 98–111)
Creatinine, Ser: 1.09 mg/dL — ABNORMAL HIGH (ref 0.44–1.00)
GFR, Estimated: >60 mL/min (ref >60)
Glucose, Bld: 129 mg/dL — ABNORMAL HIGH (ref 70–99)
Potassium: 3.5 mmol/L (ref 3.5–5.1)
Sodium: 139 mmol/L (ref 135–145)

## 2020-10-11 LAB — TROPONIN I (HIGH SENSITIVITY)
Troponin I (High Sensitivity): 6 ng/L (ref <18)
Troponin I (High Sensitivity): 6 ng/L (ref <18)

## 2020-10-11 LAB — BRAIN NATRIURETIC PEPTIDE: B Natriuretic Peptide: 238.9 pg/mL — ABNORMAL HIGH (ref 0.0–100.0)

## 2020-10-11 MED ORDER — FUROSEMIDE 10 MG/ML IJ SOLN
40.0000 mg | Freq: Once | INTRAMUSCULAR | Status: AC
  Administered 2020-10-11: 40 mg via INTRAVENOUS
  Filled 2020-10-11: qty 4

## 2020-10-11 NOTE — ED Provider Notes (Signed)
[facility] Emergency Department Provider Note   ____________________________________________    I have reviewed the triage vital signs and the nursing notes.   HISTORY  Chief Complaint Shortness of Breath and Cough     HPI Heather Norman is a 45 y.o. female with a history of CHF, diabetes, hypertension who presents with complaints of shortness of breath.  Patient reports around 3 AM this morning she developed shortness of breath, particularly while lying flat.  Symptoms improved with she would sit up.  She has had this before.  She did take her torsemide and reports she feels somewhat better now but still feels short of breath when she lies flat.  Denies chest pain.  No calf pain or swelling.    Past Medical History:  Diagnosis Date  . (HFpEF) heart failure with preserved ejection fraction (HCC)    a. 04/2017 Echo: EF 55-60%, no rwma, Mild MR, mildly dil LA. Nl RV fxn; b. 06/2019 Echo: EF 60-65%, mod LVH. Gr2 DD. Mildly dil LA.  Marland Kitchen Arthritis    kness/hands - no meds  . CHF (congestive heart failure) (HCC)   . Diabetes mellitus without complication (HCC)   . Dyspnea on exertion   . Headache(784.0)    otc med prn  . Heart murmur   . Hyperlipidemia    no meds since pregnancy  . Hypertension   . Morbid obesity [facility])     Patient Active Problem List   Diagnosis Date Noted  . Type 2 diabetes mellitus without complication, without long-term current use of insulin (HCC) 05/19/2018  . DM (diabetes mellitus) (HCC) 05/11/2017  . Snoring 05/11/2017  . Morbid obesity (HCC) 08/30/2015  . Chronic diastolic CHF (congestive heart failure) (HCC) 08/30/2015  . Essential hypertension 02/16/2015  . Elderly multigravida with antepartum condition or complication 05/22/2011    Past Surgical History:  Procedure Laterality Date  . CERVICAL CERCLAGE  06/01/2011   Procedure: CERCLAGE CERVICAL;  Surgeon: Philip Aspen, DO;  Location: [facility] ORS;  Service: Gynecology;   Laterality: N/A;  REQUESTING PORTABLE ULTARSOUND AT BEDSIDE PT'S EDC:12/02/2011  . CERVICAL CERCLAGE  06/01/2011   Procedure: CERCLAGE CERVICAL;  Surgeon: Philip Aspen, DO;  Location: [facility] ORS;  Service: Gynecology;  Laterality: N/A;  REQUESTING PORTABLE ULTARSOUND AT BEDSIDE PT'S EDC:12/02/2011  . endosocpy    . svd     x 2  . tab     x 1  . TUBAL LIGATION      Prior to Admission medications   Medication Sig Start Date End Date Taking? Authorizing Provider  albuterol (PROVENTIL HFA;VENTOLIN HFA) 108 (90 Base) MCG/ACT inhaler Inhale 2 puffs into the lungs every 4 (four) hours as needed for wheezing or shortness of breath. q4-6 h PRN for shortness of breath    [provider]  aspirin EC 81 MG tablet Take 81 mg by mouth daily.    [provider]  atorvastatin (LIPITOR) 20 MG tablet Take 1 tablet (20 mg total) by mouth daily. 06/07/20 09/05/20  Alver Sorrow, NP  carvedilol (COREG) 25 MG tablet Take 1.5 tablets (37.5 mg total) by mouth 2 (two) times daily with a meal. 05/26/20   Alver Sorrow, NP  ENTRESTO 49-51 MG TAKE 1 TABLET BY MOUTH TWICE DAILY 05/24/20   [name], FNP  fluticasone (FLONASE) 50 MCG/ACT nasal spray Place 1 spray into both nostrils daily as needed for allergies or rhinitis.    [provider]  JARDIANCE 10 MG TABS tablet  Take 10 mg by mouth daily. 10/10/19   [provider]  lidocaine (LIDODERM) 5 % APPLY 1 PATCH TO THE RIGHT ARM FOR 12 HOURS THEN REMOVE DAILY 07/09/20   [provider]  loratadine (CLARITIN) 10 MG tablet Take 10 mg by mouth daily. Takes daily    [provider]  oxybutynin (DITROPAN) 5 MG tablet Take 5 mg by mouth 2 (two) times daily. 12/25/17   [provider]  pantoprazole (PROTONIX) 20 MG tablet Take 1 tablet (20 mg total) by mouth daily. 04/12/20   [name], PA-C  potassium chloride 20 MEQ/15ML (10%) SOLN Take 58.5 mLs (78 mEq total) by mouth daily. Take 59ml daily  07/09/20   [name], FNP  spironolactone (ALDACTONE) 25 MG tablet TAKE 1 TABLET(25 MG) BY MOUTH DAILY 01/16/20   [name], MD  torsemide (DEMADEX) 20 MG tablet TAKE 2 TABLETS(40 MG) BY MOUTH TWICE DAILY 01/16/20   [name], MD     Allergies Penicillins  Family History  Problem Relation Age of Onset  . Stroke Mother   . Hypertension Mother   . Hyperlipidemia Mother   . Hypertension Father     Social History Social History   Tobacco Use  . Smoking status: Never Smoker  . Smokeless tobacco: Never Used  Vaping Use  . Vaping Use: Never used  Substance Use Topics  . Alcohol use: No  . Drug use: No    Review of Systems  Constitutional: No fever/chills Eyes: No visual changes.  ENT: No sore throat. Cardiovascular: As above Respiratory: As above, no pleurisy Gastrointestinal: No abdominal pain.  No nausea, no vomiting.   Genitourinary: Negative for dysuria. Musculoskeletal: Negative for back pain. Skin: Negative for rash. Neurological: Negative for headaches or weakness   ____________________________________________   PHYSICAL EXAM:  VITAL SIGNS: ED Triage Vitals  Enc Vitals Group     BP 10/11/20 0837 (!) 157/100     Pulse Rate 10/11/20 0837 79     Resp 10/11/20 0837 20     Temp 10/11/20 0837 98.4 F (36.9 C)     Temp Source 10/11/20 0837 Oral     SpO2 10/11/20 0837 96 %     Weight 10/11/20 0837 128.4 kg (283 lb)     Height 10/11/20 0837 1.651 m (5\' 5" )     Head Circumference --      Peak Flow --      Pain Score 10/11/20 0840 5     Pain Loc --      Pain Edu? --      Excl. in GC? --     Constitutional: Alert and oriented.  Eyes: Conjunctivae are normal.   Nose: No congestion/rhinnorhea. Mouth/Throat: Mucous membranes are moist.    Cardiovascular: Normal rate, regular rhythm. Grossly normal heart sounds.  Good peripheral circulation. Respiratory: Normal respiratory effort.  No retractions. Lungs CTAB. Gastrointestinal: Soft and  nontender. No distention.  No CVA tenderness.  Musculoskeletal: No lower extremity tenderness nor edema.  Warm and well perfused Neurologic:  Normal speech and language. No gross focal neurologic deficits are appreciated.  Skin:  Skin is warm, dry and intact. No rash noted. Psychiatric: Mood and affect are normal. Speech and behavior are normal.  ____________________________________________   LABS (all labs ordered are listed, but only abnormal results are displayed)  Labs Reviewed  BASIC METABOLIC PANEL - Abnormal; Notable for the following components:      Result Value   Glucose, Bld 129 (*)  Creatinine, Ser 1.09 (*)    Calcium 8.5 (*)    All other components within normal limits  BRAIN NATRIURETIC PEPTIDE - Abnormal; Notable for the following components:   B Natriuretic Peptide 238.9 (*)    All other components within normal limits  CBC  TROPONIN I (HIGH SENSITIVITY)  TROPONIN I (HIGH SENSITIVITY)   ____________________________________________  EKG  ED ECG REPORT I, Jene Every, the attending physician, personally viewed and interpreted this ECG.  Date: 10/11/2020  Rhythm: normal sinus rhythm QRS Axis: normal Intervals: normal ST/T Wave abnormalities: normal Narrative Interpretation: Sinus rhythm, PVCs  ____________________________________________  RADIOLOGY  Chest x-ray read by me, no infiltrate or effusion ____________________________________________   PROCEDURES  Procedure(s) performed: No  Procedures   Critical Care performed: No ____________________________________________   INITIAL IMPRESSION / ASSESSMENT AND PLAN / ED COURSE  Pertinent labs & imaging results that were available during my care of the patient were reviewed by me and considered in my medical decision making (see chart for details).  Patient presents with shortness of breath as detailed above, worse with lying flat, consistent with CHF.  Compliant with medications.  No chest  pain, no cough fevers or chills.  Took her home medications early this morning with some improvement.  We will give additional IV Lasix here, obtain labs, chest x-ray and reevaluate  High sensitive troponin is normal, chest x-ray without overt edema, otherwise lab work is unremarkable.  Patient greatly improved with IV Lasix, put out 900 cc, is able to lie flat now without shortness of breath, ambulating well without shortness of breath.  Appropriate discharge at this time with close follow-up CHF clinic, return precautions discussed    ____________________________________________   FINAL CLINICAL IMPRESSION(S) / ED DIAGNOSES  Final diagnoses:  Acute on chronic congestive heart failure, unspecified heart failure type [facility])        Note:  This document was prepared using Dragon voice recognition software and may include unintentional dictation errors.   Jene Every, MD 10/11/20 1407

## 2020-10-11 NOTE — ED Notes (Signed)
Pt reports feeling much better. She reports being able to lay flat without any difficulty and ambulated approx 50 feet without feeling short of breath.

## 2020-10-11 NOTE — ED Triage Notes (Signed)
Pt c/o SOB over night with a cough, states she has a hx of CHF, denies any wt gain and states she weighs regularly. Pt is in NAD at this time.

## 2020-10-14 ENCOUNTER — Other Ambulatory Visit: Payer: Self-pay

## 2020-10-14 ENCOUNTER — Encounter: Payer: Self-pay | Admitting: Family

## 2020-10-14 ENCOUNTER — Ambulatory Visit: Payer: Managed Care, Other (non HMO) | Attending: Family | Admitting: Family

## 2020-10-14 VITALS — BP 141/74 | HR 42 | Resp 20 | Ht 65.0 in | Wt 285.5 lb

## 2020-10-14 DIAGNOSIS — I5032 Chronic diastolic (congestive) heart failure: Secondary | ICD-10-CM | POA: Insufficient documentation

## 2020-10-14 DIAGNOSIS — R0602 Shortness of breath: Secondary | ICD-10-CM | POA: Diagnosis not present

## 2020-10-14 DIAGNOSIS — Z8249 Family history of ischemic heart disease and other diseases of the circulatory system: Secondary | ICD-10-CM | POA: Insufficient documentation

## 2020-10-14 DIAGNOSIS — Z7982 Long term (current) use of aspirin: Secondary | ICD-10-CM | POA: Diagnosis not present

## 2020-10-14 DIAGNOSIS — Z880 Allergy status to penicillin: Secondary | ICD-10-CM | POA: Insufficient documentation

## 2020-10-14 DIAGNOSIS — E119 Type 2 diabetes mellitus without complications: Secondary | ICD-10-CM | POA: Diagnosis not present

## 2020-10-14 DIAGNOSIS — R200 Anesthesia of skin: Secondary | ICD-10-CM | POA: Insufficient documentation

## 2020-10-14 DIAGNOSIS — R5383 Other fatigue: Secondary | ICD-10-CM | POA: Diagnosis not present

## 2020-10-14 DIAGNOSIS — I493 Ventricular premature depolarization: Secondary | ICD-10-CM | POA: Insufficient documentation

## 2020-10-14 DIAGNOSIS — R002 Palpitations: Secondary | ICD-10-CM | POA: Diagnosis not present

## 2020-10-14 DIAGNOSIS — M1990 Unspecified osteoarthritis, unspecified site: Secondary | ICD-10-CM | POA: Insufficient documentation

## 2020-10-14 DIAGNOSIS — I10 Essential (primary) hypertension: Secondary | ICD-10-CM

## 2020-10-14 DIAGNOSIS — I110 Hypertensive heart disease with heart failure: Secondary | ICD-10-CM | POA: Diagnosis not present

## 2020-10-14 NOTE — Progress Notes (Signed)
Patient ID: [name], female    DOB: August 29, 1975, 45 y.o.   MRN: [id number]  HPI  Ms Branum is a 45 y/o female with a history of arthritis, DM, hyperlipidemia, HTN, morbid obesity and chronic heart failure.  Echo report from 06/16/2019 reviewed and showed an EF of 60-65% along with moderately increased LVH. Echo report from 05/16/17 reviewed and showed an EF of 55-60% along with mild MR. Reviewed echo report from 05/15/14 which showed an EF of 60-65% along with mild MR and normal PA pressure.  Was in the ED 10/11/20 due to shortness of breath and cough due to acute HF exacerbation. IV lasix given with resultant output of 900 cc with improvement of symptoms and she was released.   She presents today for a follow-up visit with a chief complaint of moderate fatigue with little exertion. She describes this as chronic in nature having been present for several years although does feel like her fatigue has worsened recently. She has associated shortness of breath, intermittent right arm numbness, gradual weight gain and intermittent joint pain along with this. She denies any difficulty sleeping, dizziness, abdominal distention, palpitations, pedal edema, chest pain, wheezing or cough.   HR ranging from 40's to upper 60's while in the office today  Past Medical History:  Diagnosis Date  . (HFpEF) heart failure with preserved ejection fraction (HCC)    a. 04/2017 Echo: EF 55-60%, no rwma, Mild MR, mildly dil LA. Nl RV fxn; b. 06/2019 Echo: EF 60-65%, mod LVH. Gr2 DD. Mildly dil LA.  Marland Kitchen Arthritis    kness/hands - no meds  . CHF (congestive heart failure) (HCC)   . Diabetes mellitus without complication (HCC)   . Dyspnea on exertion   . Headache(784.0)    otc med prn  . Heart murmur   . Hyperlipidemia    no meds since pregnancy  . Hypertension   . Morbid obesity (HCC)    Past Surgical History:  Procedure Laterality Date  . CERVICAL CERCLAGE  06/01/2011   Procedure: CERCLAGE CERVICAL;   Surgeon: Philip Aspen, DO;  Location: [facility];  Service: Gynecology;  Laterality: N/A;  REQUESTING PORTABLE ULTARSOUND AT BEDSIDE PT'S EDC:12/02/2011  . CERVICAL CERCLAGE  06/01/2011   Procedure: CERCLAGE CERVICAL;  Surgeon: Philip Aspen, DO;  Location: [facility];  Service: Gynecology;  Laterality: N/A;  REQUESTING PORTABLE ULTARSOUND AT BEDSIDE PT'S EDC:12/02/2011  . endosocpy    . svd     x 2  . tab     x 1  . TUBAL LIGATION     Family History  Problem Relation Age of Onset  . Stroke Mother   . Hypertension Mother   . Hyperlipidemia Mother   . Hypertension Father    Social History   Tobacco Use  . Smoking status: Never Smoker  . Smokeless tobacco: Never Used  Substance Use Topics  . Alcohol use: No   Allergies  Allergen Reactions  . Penicillins Anaphylaxis    Has patient had a PCN reaction causing immediate rash, facial/tongue/throat swelling, SOB or lightheadedness with hypotension: Yes Has patient had a PCN reaction causing severe rash involving mucus membranes or skin necrosis: No Has patient had a PCN reaction that required hospitalization: No Has patient had a PCN reaction occurring within the last 10 years: No If all of the above answers are "NO", then may proceed with Cephalosporin use.   Prior to Admission medications   Medication Sig Start Date End Date Taking? Authorizing Provider  albuterol (  PROVENTIL HFA;VENTOLIN HFA) 108 (90 Base) MCG/ACT inhaler Inhale 2 puffs into the lungs every 4 (four) hours as needed for wheezing or shortness of breath. q4-6 h PRN for shortness of breath   Yes [provider]  aspirin EC 81 MG tablet Take 81 mg by mouth daily.   Yes [provider]  atorvastatin (LIPITOR) 20 MG tablet Take 1 tablet (20 mg total) by mouth daily. 06/07/20 09/05/20 Yes Alver Sorrow, NP  carvedilol (COREG) 25 MG tablet Take 1.5 tablets (37.5 mg total) by mouth 2 (two) times daily with a meal. 05/26/20  Yes Alver Sorrow, NP  ENTRESTO  49-51 MG TAKE 1 TABLET BY MOUTH TWICE DAILY 05/24/20  Yes [name], FNP  fluticasone (FLONASE) 50 MCG/ACT nasal spray Place 1 spray into both nostrils daily as needed for allergies or rhinitis.   Yes [provider]  JARDIANCE 10 MG TABS tablet Take 10 mg by mouth daily. 10/10/19  Yes [provider]  lidocaine (LIDODERM) 5 % APPLY 1 PATCH TO THE RIGHT ARM FOR 12 HOURS THEN REMOVE DAILY 07/09/20  Yes [provider]  loratadine (CLARITIN) 10 MG tablet Take 10 mg by mouth daily. Takes daily   Yes [provider]  oxybutynin (DITROPAN) 5 MG tablet Take 5 mg by mouth 2 (two) times daily. 12/25/17  Yes [provider]  pantoprazole (PROTONIX) 20 MG tablet Take 1 tablet (20 mg total) by mouth daily. Patient taking differently: Take 40 mg by mouth daily. 04/12/20  Yes Bridget Hartshorn L, PA-C  potassium chloride 20 MEQ/15ML (10%) SOLN Take 58.5 mLs (78 mEq total) by mouth daily. Take 39ml daily 07/09/20  Yes [name], FNP  spironolactone (ALDACTONE) 25 MG tablet TAKE 1 TABLET(25 MG) BY MOUTH DAILY 01/16/20  Yes Gollan, Tollie Pizza, MD  torsemide (DEMADEX) 20 MG tablet TAKE 2 TABLETS(40 MG) BY MOUTH TWICE DAILY 01/16/20  Yes Gollan, Tollie Pizza, MD    Review of Systems  Constitutional: Positive for fatigue (easily). Negative for appetite change.  HENT: Negative for congestion, postnasal drip and sore throat.   Eyes: Negative.   Respiratory: Positive for shortness of breath (when walking up steps). Negative for cough and wheezing.   Cardiovascular: Negative for chest pain, palpitations and leg swelling.  Gastrointestinal: Negative for abdominal distention and abdominal pain.  Endocrine: Negative.   Genitourinary: Negative.   Musculoskeletal: Positive for arthralgias (right knee/ ankle). Negative for back pain and neck pain.  Skin: Negative for rash and wound.  Allergic/Immunologic: Negative.   Neurological: Positive for numbness (down right arm at  times). Negative for dizziness and light-headedness.  Hematological: Negative for adenopathy. Does not bruise/bleed easily.  Psychiatric/Behavioral: Negative for dysphoric mood and sleep disturbance (sleeping on 2 pillows). The patient is not nervous/anxious.    Vitals:   10/14/20 1105  BP: (!) 141/74  Pulse: (!) 42  Resp: 20  SpO2: 96%  Weight: 285 lb 8 oz (129.5 kg)  Height: 5\' 5"  (1.651 m)   Wt Readings from Last 3 Encounters:  10/14/20 285 lb 8 oz (129.5 kg)  10/11/20 283 lb (128.4 kg)  09/27/20 283 lb (128.4 kg)   Lab Results  Component Value Date   CREATININE 1.09 (H) 10/11/2020   CREATININE 1.37 (H) 06/05/2020   CREATININE 1.31 (H) 04/12/2020    Physical Exam Vitals and nursing note reviewed.  Constitutional:      Appearance: She is well-developed.  HENT:     Head: Normocephalic and atraumatic.  Neck:  Vascular: No JVD.  Cardiovascular:     Rate and Rhythm: Regular rhythm. Bradycardia present.  Pulmonary:     Effort: Pulmonary effort is normal. No respiratory distress.     Breath sounds: No wheezing or rales.  Abdominal:     Palpations: Abdomen is soft.     Tenderness: There is no abdominal tenderness.  Musculoskeletal:     Cervical back: Normal range of motion and neck supple.     Right lower leg: No tenderness. No edema.     Left lower leg: No tenderness. No edema.  Skin:    General: Skin is warm and dry.  Neurological:     General: No focal deficit present.     Mental Status: She is alert and oriented to person, place, and time.  Psychiatric:        Mood and Affect: Mood normal.        Behavior: Behavior normal.    Assessment & Plan:   1: Chronic heart failure with preserved ejection fraction with structural changes- - NYHA class III - euvolemic today - weighing daily; reminded to call for an overnight weight gain of >2 pounds or a weekly weight gain of >5 pounds - weight up 6 pounds from last visit 3 months ago - not adding salt to her food  and is trying to follow a low sodium diet - encouraged her to be as active as possible - on GDMT of carvedilol, entresto, jardiance and spironolactone - BP's have been low in the past - BNP 10/11/20 was 238.9 - reports receiving both COVID vaccines; has not gotten covid booster or flu vaccine yet  2: HTN- - BP looks good today - sees PCP [facility])  - BMP from 10/11/20 reviewed and shows sodium 139, potassium 3.5, creatinine 1.09 and GFR >60  3: Diabetes-  - A1c was 6.6% in 2014  4: PVC's- - saw cardiology Mariah Milling) 09/27/20 - has worn Zio monitor which showed predominantly NSR with frequent PVC's - HR ranging between 40-60's while sitting in the office today - EKG done in the office and showed sinus bradycardia with frequent PVC's - due to bradycardia and fatigue will decrease her carvedilol to 25mg  AM and 37.5mg  PM; should palpitations worsen, she will need to contact cardiologist   Patient did not bring her medications nor a list. Each medication was verbally reviewed with the patient and she was encouraged to bring the bottles to every visit to confirm accuracy of list.  Return in 3 months or sooner for any questions/problems before then.

## 2020-10-14 NOTE — Patient Instructions (Addendum)
Continue weighing daily and call for an overnight weight gain of > 2 pounds or a weekly weight gain of >5 pounds.   Change your carvedilol to 1 tablet in the morning and 1.5 tablet in the evening

## 2020-12-15 IMAGING — CR DG CHEST 2V
2 series · 2 of 2 positions shown · non-contrast
Comparison: 10/16/2019

CLINICAL DATA: Chest pain.

EXAM:
CHEST - 2 VIEW

[chest pa]
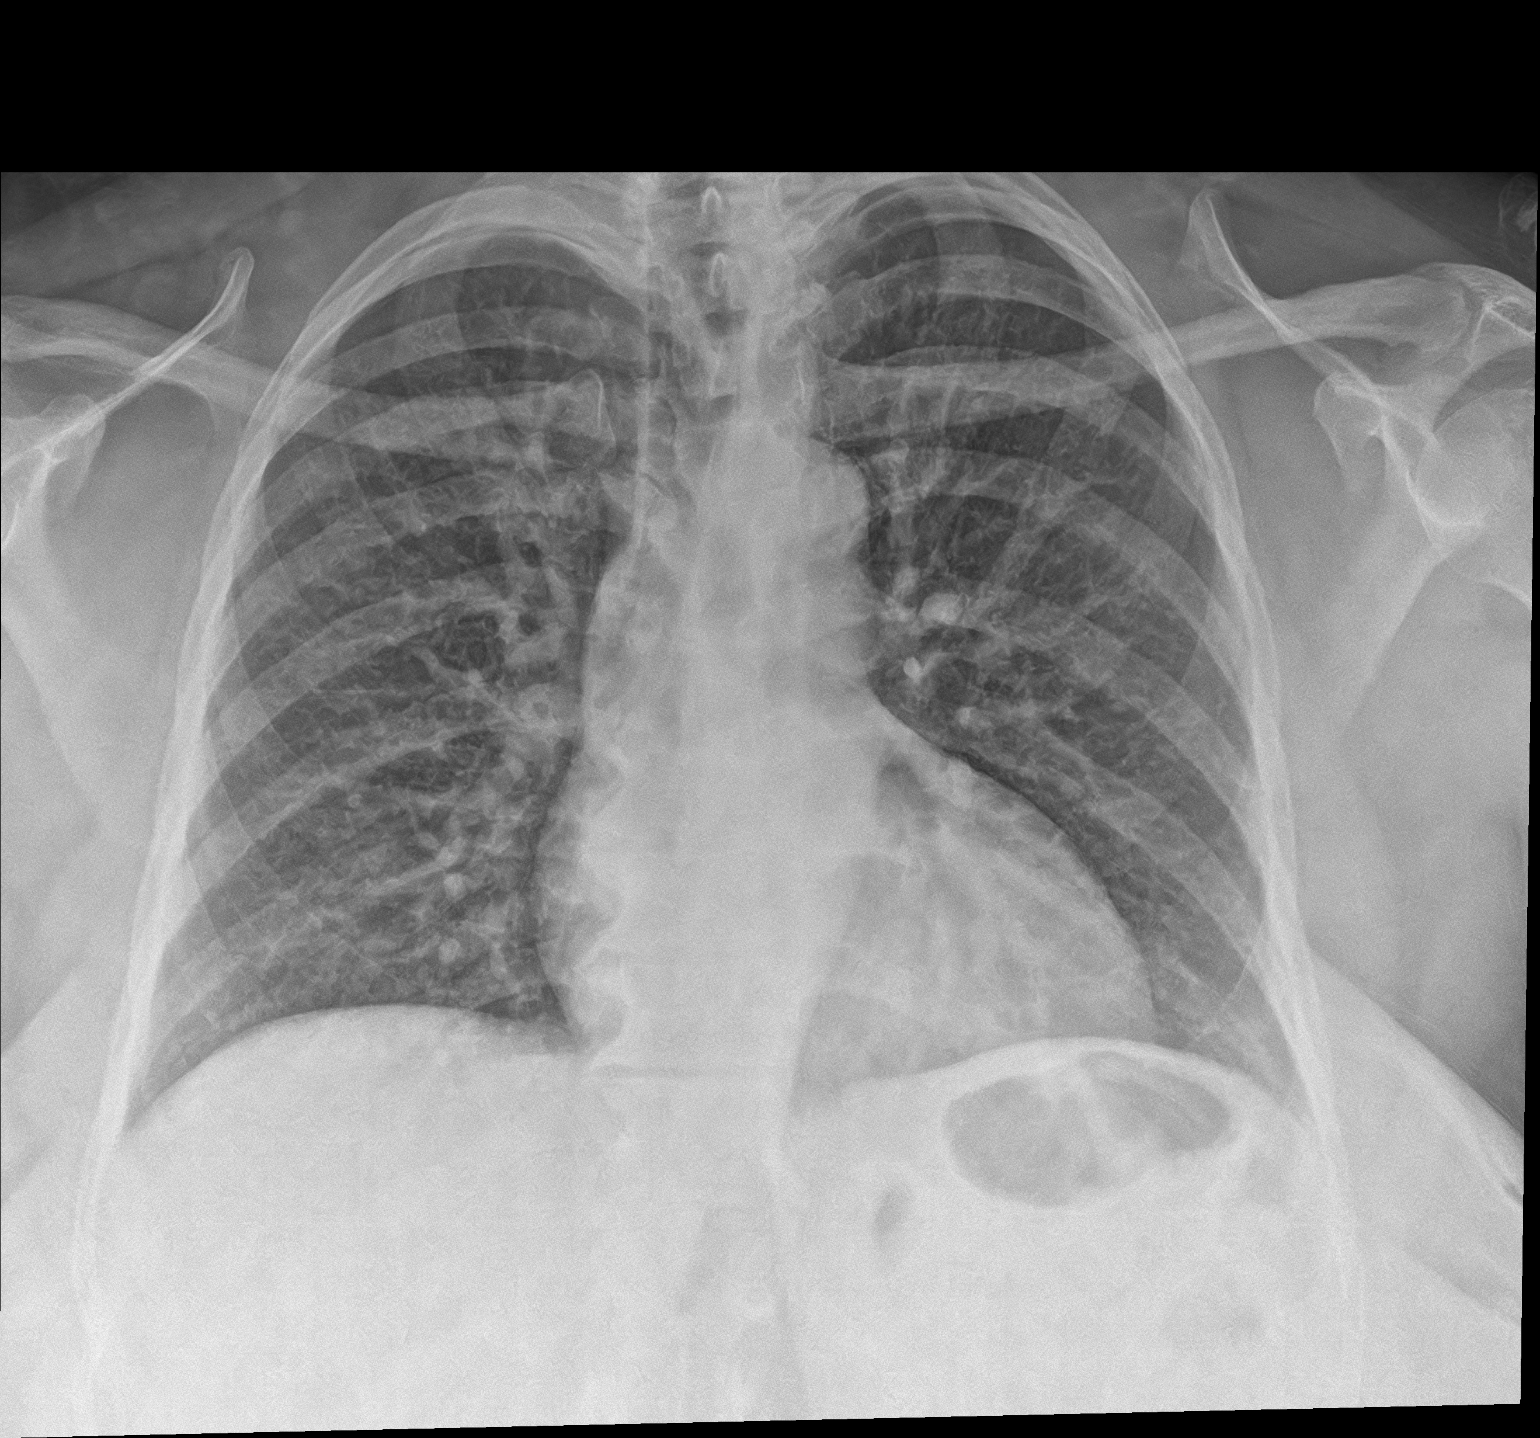

[chest lat]
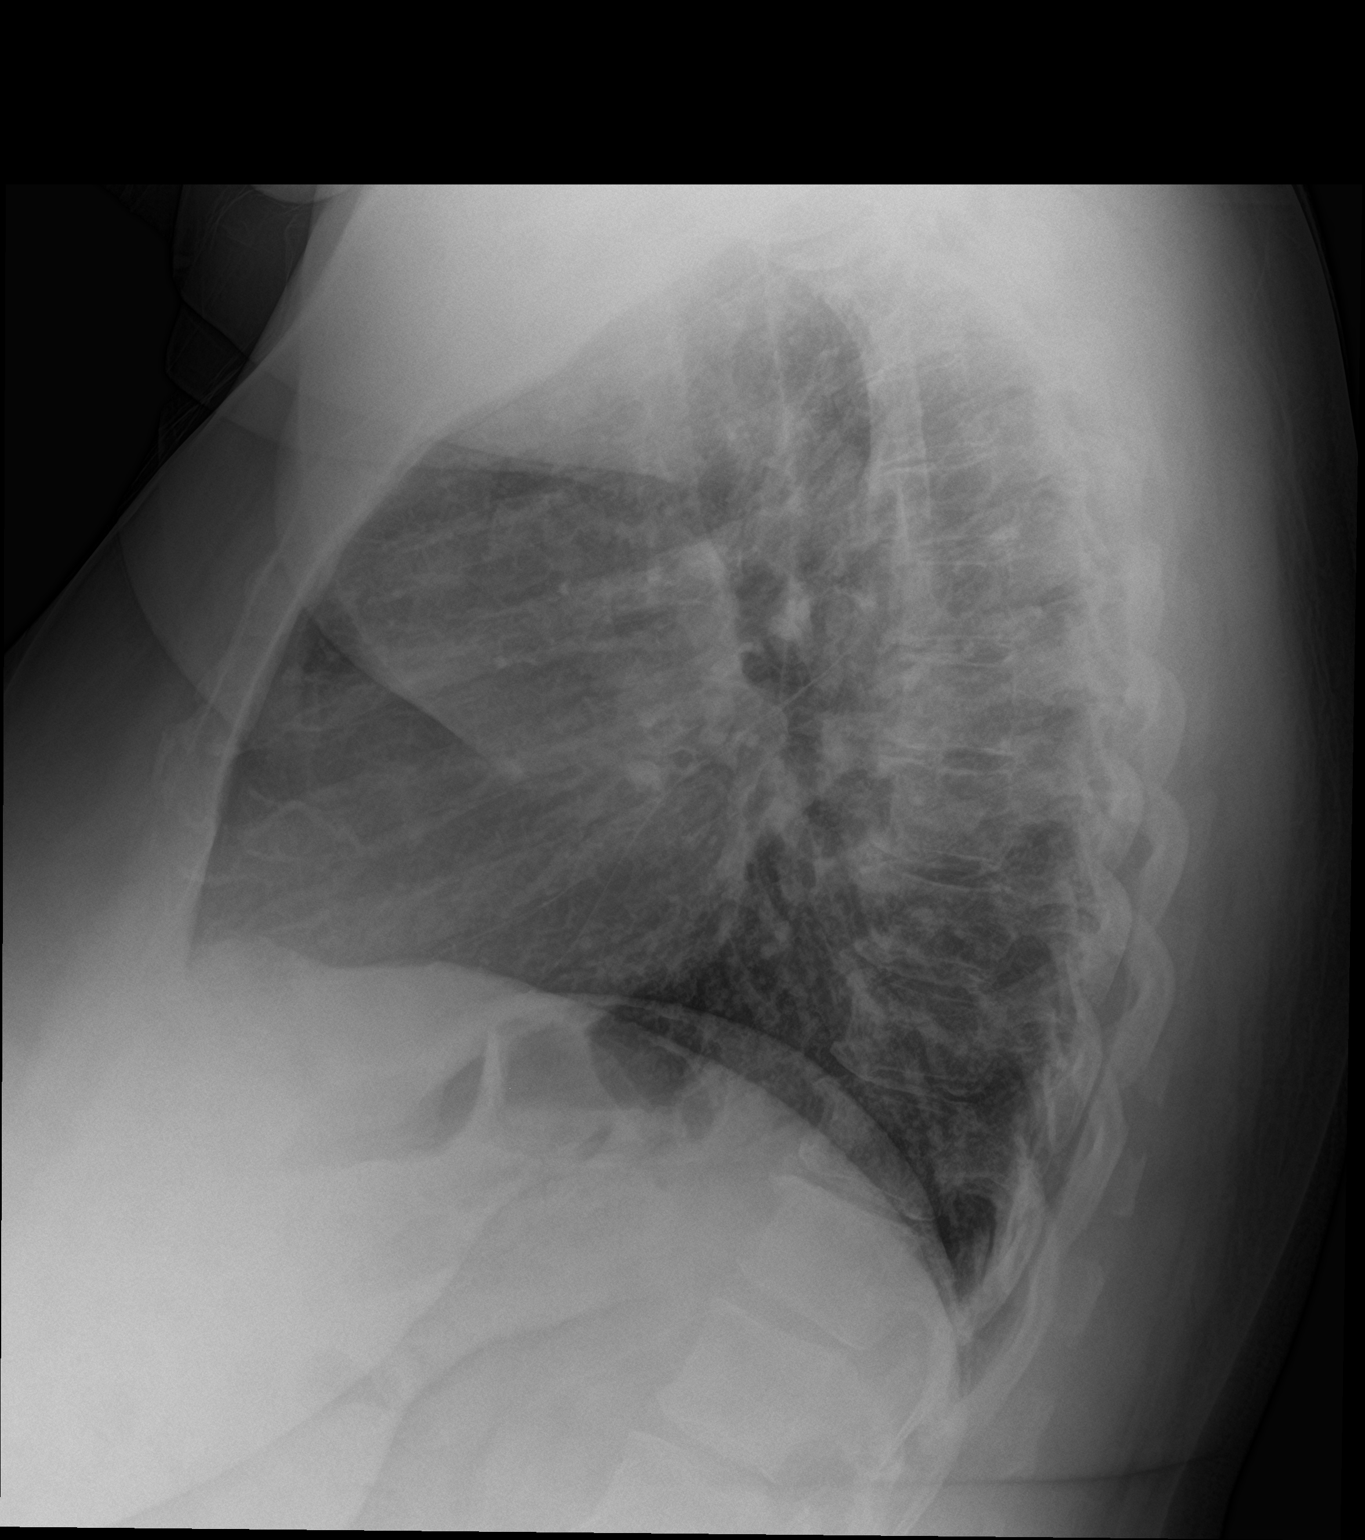

[2 of 2 positions shown; findings below may reference images not displayed]

FINDINGS: The cardiac silhouette, mediastinal and hilar contours are within
normal limits and stable.

The lungs are clear. No pleural effusions or pulmonary lesions. The
bony thorax is intact.
IMPRESSION: No acute cardiopulmonary findings.

## 2021-01-02 NOTE — Progress Notes (Signed)
Patient ID: Heather Norman, female    DOB: [birth year], 45 y.o.   MRN: [id number]  HPI  Ms Ramella is a 45 y/o female with a history of arthritis, DM, hyperlipidemia, HTN, morbid obesity and chronic heart failure.  Echo report from 06/16/2019 reviewed and showed an EF of 60-65% along with moderately increased LVH. Echo report from 05/16/17 reviewed and showed an EF of 55-60% along with mild MR. Reviewed echo report from 05/15/14 which showed an EF of 60-65% along with mild MR and normal PA pressure.  Was in the ED 10/11/20 due to shortness of breath and cough due to acute HF exacerbation. IV lasix given with resultant output of 900 cc with improvement of symptoms and she was released.   She presents today for a follow-up visit with a chief complaint of moderate fatigue with little exertion. She describes this as chronic in nature having been present for several years although she feels like it has worsened over the last several months. She has associated shortness of breath, knee pain, difficulty sleeping, anxiety and depression along with this. She denies any dizziness, abdominal distention, palpitations, pedal edema, chest pain, cough or weight gain.   She notes that she's feeling quite anxious and depressed as her oldest son was diagnosed with multiple sclerosis ~ 2 months ago. She also experienced 2 deaths close to this same time. She says that she's not sleeping well at all and is planning on making an appointment with her PCP to discuss treatment with medication.   Past Medical History:  Diagnosis Date  . (HFpEF) heart failure with preserved ejection fraction (HCC)    a. 04/2017 Echo: EF 55-60%, no rwma, Mild MR, mildly dil LA. Nl RV fxn; b. 06/2019 Echo: EF 60-65%, mod LVH. Gr2 DD. Mildly dil LA.  Marland Kitchen Arthritis    kness/hands - no meds  . CHF (congestive heart failure) (HCC)   . Diabetes mellitus without complication (HCC)   . Dyspnea on exertion   . Headache(784.0)    otc med prn  .  Heart murmur   . Hyperlipidemia    no meds since pregnancy  . Hypertension   . Morbid obesity (HCC)    Past Surgical History:  Procedure Laterality Date  . CERVICAL CERCLAGE  06/01/2011   Procedure: CERCLAGE CERVICAL;  Surgeon: Philip Aspen, DO;  Location: [facility];  Service: Gynecology;  Laterality: N/A;  REQUESTING PORTABLE ULTARSOUND AT BEDSIDE PT'S EDC:12/02/2011  . CERVICAL CERCLAGE  06/01/2011   Procedure: CERCLAGE CERVICAL;  Surgeon: Philip Aspen, DO;  Location: [facility];  Service: Gynecology;  Laterality: N/A;  REQUESTING PORTABLE ULTARSOUND AT BEDSIDE PT'S EDC:12/02/2011  . endosocpy    . svd     x 2  . tab     x 1  . TUBAL LIGATION     Family History  Problem Relation Age of Onset  . Stroke Mother   . Hypertension Mother   . Hyperlipidemia Mother   . Hypertension Father    Social History   Tobacco Use  . Smoking status: Never Smoker  . Smokeless tobacco: Never Used  Substance Use Topics  . Alcohol use: No   Allergies  Allergen Reactions  . Penicillins Anaphylaxis    Has patient had a PCN reaction causing immediate rash, facial/tongue/throat swelling, SOB or lightheadedness with hypotension: Yes Has patient had a PCN reaction causing severe rash involving mucus membranes or skin necrosis: No Has patient had a PCN reaction that required hospitalization: No Has patient had  a PCN reaction occurring within the last 10 years: No If all of the above answers are "NO", then may proceed with Cephalosporin use.   Prior to Admission medications   Medication Sig Start Date End Date Taking? Authorizing Provider  albuterol (PROVENTIL HFA;VENTOLIN HFA) 108 (90 Base) MCG/ACT inhaler Inhale 2 puffs into the lungs every 4 (four) hours as needed for wheezing or shortness of breath. q4-6 h PRN for shortness of breath   Yes [provider]  aspirin EC 81 MG tablet Take 81 mg by mouth daily.   Yes [provider]  atorvastatin (LIPITOR) 20 MG tablet Take 1 tablet  (20 mg total) by mouth daily. 06/07/20 09/05/20 Yes Alver Sorrow, NP  carvedilol (COREG) 25 MG tablet Take 1.5 tablets (37.5 mg total) by mouth 2 (two) times daily with a meal. Patient taking differently: Take by mouth 2 (two) times daily with a meal. 1.5 tablets in the morning and 1 tablets in the evening 05/26/20  Yes Alver Sorrow, NP  ENTRESTO 49-51 MG TAKE 1 TABLET BY MOUTH TWICE DAILY 05/24/20  Yes [name], FNP  fluticasone (FLONASE) 50 MCG/ACT nasal spray Place 1 spray into both nostrils daily as needed for allergies or rhinitis.   Yes [provider]  JARDIANCE 10 MG TABS tablet Take 10 mg by mouth daily. 10/10/19  Yes [provider]  lidocaine (LIDODERM) 5 % Place 1 patch onto the skin as needed. 07/09/20  Yes [provider]  loratadine (CLARITIN) 10 MG tablet Take 10 mg by mouth daily. Takes daily   Yes [provider]  oxybutynin (DITROPAN) 5 MG tablet Take 5 mg by mouth 2 (two) times daily. 12/25/17  Yes [provider]  pantoprazole (PROTONIX) 20 MG tablet Take 1 tablet (20 mg total) by mouth daily. Patient taking differently: Take 40 mg by mouth daily. 04/12/20  Yes Bridget Hartshorn L, PA-C  potassium chloride 20 MEQ/15ML (10%) SOLN Take 58.5 mLs (78 mEq total) by mouth daily. Take 5ml daily Patient taking differently: Take 80 mEq by mouth daily. Pt takes 59ml daily 07/09/20  Yes [name], FNP  spironolactone (ALDACTONE) 25 MG tablet TAKE 1 TABLET(25 MG) BY MOUTH DAILY 01/16/20  Yes Gollan, Tollie Pizza, MD  torsemide (DEMADEX) 20 MG tablet TAKE 2 TABLETS(40 MG) BY MOUTH TWICE DAILY 01/16/20  Yes Gollan, Tollie Pizza, MD   Review of Systems  Constitutional: Positive for fatigue (easily). Negative for appetite change.  HENT: Negative for congestion, postnasal drip and sore throat.   Eyes: Negative.   Respiratory: Positive for shortness of breath (when walking up steps). Negative for cough and wheezing.   Cardiovascular:  Negative for chest pain, palpitations and leg swelling.  Gastrointestinal: Negative for abdominal distention and abdominal pain.  Endocrine: Negative.   Genitourinary: Negative.   Musculoskeletal: Positive for arthralgias (right knee). Negative for back pain and neck pain.  Skin: Negative for rash and wound.  Allergic/Immunologic: Negative.   Neurological: Positive for numbness (down right arm at times). Negative for dizziness and light-headedness.  Hematological: Negative for adenopathy. Does not bruise/bleed easily.  Psychiatric/Behavioral: Positive for dysphoric mood and sleep disturbance (not sleeping well; sleeping on 2 pillows). The patient is nervous/anxious.    Vitals:   01/03/21 1229  BP: (!) 120/58  Pulse: 85  Resp: 18  SpO2: 97%  Weight: 285 lb (129.3 kg)  Height: 5\' 5"  (1.651 m)   Wt Readings from Last 3 Encounters:  01/03/21 285 lb (129.3 kg)  10/14/20 285 lb 8 oz (129.5 kg)  10/11/20 283 lb (128.4 kg)   Lab Results  Component Value Date   CREATININE 1.09 (H) 10/11/2020   CREATININE 1.37 (H) 06/05/2020   CREATININE 1.31 (H) 04/12/2020    Physical Exam Vitals and nursing note reviewed.  Constitutional:      Appearance: She is well-developed.  HENT:     Head: Normocephalic and atraumatic.  Neck:     Vascular: No JVD.  Cardiovascular:     Rate and Rhythm: Normal rate and regular rhythm.  Pulmonary:     Effort: Pulmonary effort is normal. No respiratory distress.     Breath sounds: No wheezing or rales.  Abdominal:     Palpations: Abdomen is soft.     Tenderness: There is no abdominal tenderness.  Musculoskeletal:     Cervical back: Normal range of motion and neck supple.     Right lower leg: No tenderness. No edema.     Left lower leg: No tenderness. No edema.  Skin:    General: Skin is warm and dry.  Neurological:     General: No focal deficit present.     Mental Status: She is alert and oriented to person, place, and time.  Psychiatric:         Mood and Affect: Mood normal.        Behavior: Behavior normal.    Assessment & Plan:   1: Chronic heart failure with preserved ejection fraction with structural changes- - NYHA class III - euvolemic today - weighing daily; reminded to call for an overnight weight gain of >2 pounds or a weekly weight gain of >5 pounds - weight unchanged from last visit 3 months ago - not adding salt to her food and is trying to follow a low sodium diet - encouraged her to be as active as possible - on GDMT of carvedilol, entresto, jardiance and spironolactone - doesn't notice any change in her fatigue with reduction in carvedilol - have scheduled echo for 02/10/21 - BNP 10/11/20 was 238.9 - PharmD reconciled medications with the patient  2: HTN- - BP looks good today - sees PCP [facility])  - BMP from 10/11/20 reviewed and shows sodium 139, potassium 3.5, creatinine 1.09 and GFR >60  3: PVC's- - saw cardiology Mariah Milling) 09/27/20; returns August - has worn Zio monitor in the past which showed predominantly NSR with frequent PVC's - HR ranging between 40-120's from apple watch record - at last visit decreased carvedilol to 25mg  AM and 37.5mg  PM; no worsening of PVC's  4: Depression- - son was diagnosed with multiple sclerosis ~ 2 months ago and she admits that she's been anxious and depressed since that time - also experienced 2 deaths close to this time as well - emotional support offered and encouraged her to speak with PCP about possible treatment; emphasized that her mental health can certainly affect her physical health   Medication bottles reviewed.   Return in 3 months or sooner for any questions/problems before then.

## 2021-01-03 ENCOUNTER — Encounter: Payer: Self-pay | Admitting: Family

## 2021-01-03 ENCOUNTER — Ambulatory Visit: Payer: Managed Care, Other (non HMO) | Attending: Family | Admitting: Family

## 2021-01-03 ENCOUNTER — Other Ambulatory Visit: Payer: Self-pay

## 2021-01-03 ENCOUNTER — Encounter: Payer: Self-pay | Admitting: Pharmacist

## 2021-01-03 VITALS — BP 120/58 | HR 85 | Resp 18 | Ht 65.0 in | Wt 285.0 lb

## 2021-01-03 DIAGNOSIS — Z8249 Family history of ischemic heart disease and other diseases of the circulatory system: Secondary | ICD-10-CM | POA: Diagnosis not present

## 2021-01-03 DIAGNOSIS — M25569 Pain in unspecified knee: Secondary | ICD-10-CM | POA: Diagnosis not present

## 2021-01-03 DIAGNOSIS — F419 Anxiety disorder, unspecified: Secondary | ICD-10-CM | POA: Diagnosis not present

## 2021-01-03 DIAGNOSIS — G479 Sleep disorder, unspecified: Secondary | ICD-10-CM | POA: Insufficient documentation

## 2021-01-03 DIAGNOSIS — R0602 Shortness of breath: Secondary | ICD-10-CM | POA: Insufficient documentation

## 2021-01-03 DIAGNOSIS — I5032 Chronic diastolic (congestive) heart failure: Secondary | ICD-10-CM | POA: Insufficient documentation

## 2021-01-03 DIAGNOSIS — Z880 Allergy status to penicillin: Secondary | ICD-10-CM | POA: Diagnosis not present

## 2021-01-03 DIAGNOSIS — I110 Hypertensive heart disease with heart failure: Secondary | ICD-10-CM | POA: Insufficient documentation

## 2021-01-03 DIAGNOSIS — F32A Depression, unspecified: Secondary | ICD-10-CM | POA: Diagnosis not present

## 2021-01-03 DIAGNOSIS — I493 Ventricular premature depolarization: Secondary | ICD-10-CM | POA: Insufficient documentation

## 2021-01-03 DIAGNOSIS — F329 Major depressive disorder, single episode, unspecified: Secondary | ICD-10-CM

## 2021-01-03 DIAGNOSIS — I10 Essential (primary) hypertension: Secondary | ICD-10-CM

## 2021-01-03 NOTE — Patient Instructions (Signed)
Continue weighing daily and call for an overnight weight gain of > 2 pounds or a weekly weight gain of >5 pounds. 

## 2021-01-03 NOTE — Progress Notes (Signed)
[facility] - HEART FAILURE CLINIC - PHARMACIST COUNSELING NOTE  Guideline-Directed Medical Therapy/Evidence Based Medicine  ACE/ARB/ARNI: Sacubitril-valsartan 49-51 mg twice daily Beta Blocker: Carvedilol 37.5 mg every morning and 25 mg every evening Aldosterone Antagonist: Spironolactone 25 mg daily Diuretic: Torsemide 40 mg twice daily SGLT2i: Empagliflozin 10 mg daily  Adherence Assessment  Do you ever forget to take your medication? [] Yes [x] No  Do you ever skip doses due to side effects? [] Yes [x] No  Do you have trouble affording your medicines? [] Yes [x] No  Are you ever unable to pick up your medication due to transportation difficulties? [] Yes [x] No  Do you ever stop taking your medications because you don't believe they are helping? [] Yes [x] No  Do you check your weight daily? [x] Yes [] No   Adherence strategy: Sets reminders on her phone  Barriers to obtaining medications: N/A  Vital signs: HR 85, BP 120/58, weight (pounds) 285 ECHO: Date 06/16/2019, EF 60-65%, notes moderately increased LVH  BMP Latest Ref Rng & Units 10/11/2020 06/05/2020 04/12/2020  Glucose 70 - 99 mg/dL ) ) )  BUN 6 - 20 mg/dL 14 ) )  Creatinine 0.44 - 1.00 mg/dL ) ) )  BUN/Creat Ratio 9 - 23 - - -  Sodium 135 - 145 mmol/L 139 139 138  Potassium 3.5 - 5.1 mmol/L 3.5 4.3 3.7  Chloride 98 - 111 mmol/L 105 103 100  CO2 22 - 32 mmol/L 27 27 27   Calcium 8.9 - 10.3 mg/dL 06/18/2019) 8.9 10/13/2020)    Past Medical History:  Diagnosis Date  . (HFpEF) heart failure with preserved ejection fraction (HCC)    a. 04/2017 Echo: EF 55-60%, no rwma, Mild MR, mildly dil LA. Nl RV fxn; b. 06/2019 Echo: EF 60-65%, mod LVH. Gr2 DD. Mildly dil LA.  269(S Arthritis    kness/hands - no meds  . CHF (congestive heart failure) (HCC)   . Diabetes mellitus without complication (HCC)   . Dyspnea on exertion   . Headache(784.0)    otc med prn  . Heart murmur   .  Hyperlipidemia    no meds since pregnancy  . Hypertension   . Morbid obesity [facility])     ASSESSMENT 45 year old female who presents to the HF clinic for a follow up visit. Pt's carvedilol was increased in 06/2020 to reduce PVC burden. Pt's dose was later decreased from 37.5 mg BID to 25 mg every morning and 37.5 mg every evening due to bradycardia and fatigue. Pt reports her overall fatigue is about the same despite these changes. Pt also reports shortness of breath but states it may be due to the change in weather. Pt has a log of her HR rangin from mid 40s to 110/120s. Pt wore a Zio monitor which showed predominantly NSR with frequent PVC's. Pt denies symptoms/episodes of hypotension.  Recent ED Visit (past 6 months): Date - 10/11/2020, [facility] - SOB/acute on chronic CHF  PLAN CHF/HTN -continue carvedilol 25 mg QAM and 37.5 mg QPM, Entresto 49-51 mg BID, Empagliflozin 10 mg daily, spironolactone 25 mg daily, torsemide 40 mg BID -continue to check BP and weight -discussed repeat echo with provide since pt has not had one sine 06/2019  PVCs -continue carvedilol as above -follow up with cardiologist  HLD -continue atorvastatin 20 mg daily  Allergies -continue loratadine 10 mg daily and fluticasone nasal spray PRN    Time spent: 15 minutes  9.37(J, PharmD Pharmacy Resident  01/03/2021 1:03 PM    Current Outpatient  Medications:  .  albuterol (PROVENTIL HFA;VENTOLIN HFA) 108 (90 Base) MCG/ACT inhaler, Inhale 2 puffs into the lungs every 4 (four) hours as needed for wheezing or shortness of breath. q4-6 h PRN for shortness of breath, Disp: , Rfl:  .  aspirin EC 81 MG tablet, Take 81 mg by mouth daily., Disp: , Rfl:  .  atorvastatin (LIPITOR) 20 MG tablet, Take 1 tablet (20 mg total) by mouth daily., Disp: 90 tablet, Rfl: 3 .  carvedilol (COREG) 25 MG tablet, Take 1.5 tablets (37.5 mg total) by mouth 2 (two) times daily with a meal. (Patient taking differently: Take by mouth 2  (two) times daily with a meal. 1 tablet in the morning and 1.5 tablets in the evening), Disp: 270 tablet, Rfl: 1 .  ENTRESTO 49-51 MG, TAKE 1 TABLET BY MOUTH TWICE DAILY, Disp: 60 tablet, Rfl: 5 .  fluticasone (FLONASE) 50 MCG/ACT nasal spray, Place 1 spray into both nostrils daily as needed for allergies or rhinitis., Disp: , Rfl:  .  JARDIANCE 10 MG TABS tablet, Take 10 mg by mouth daily., Disp: , Rfl:  .  lidocaine (LIDODERM) 5 %, APPLY 1 PATCH TO THE RIGHT ARM FOR 12 HOURS THEN REMOVE DAILY, Disp: , Rfl:  .  loratadine (CLARITIN) 10 MG tablet, Take 10 mg by mouth daily. Takes daily, Disp: , Rfl:  .  oxybutynin (DITROPAN) 5 MG tablet, Take 5 mg by mouth 2 (two) times daily., Disp: , Rfl:  .  pantoprazole (PROTONIX) 20 MG tablet, Take 1 tablet (20 mg total) by mouth daily. (Patient taking differently: Take 40 mg by mouth daily.), Disp: 30 tablet, Rfl: 1 .  potassium chloride 20 MEQ/15ML (10%) SOLN, Take 58.5 mLs (78 mEq total) by mouth daily. Take 62ml daily, Disp: 1800 mL, Rfl: 5 .  spironolactone (ALDACTONE) 25 MG tablet, TAKE 1 TABLET(25 MG) BY MOUTH DAILY, Disp: 90 tablet, Rfl: 3 .  torsemide (DEMADEX) 20 MG tablet, TAKE 2 TABLETS(40 MG) BY MOUTH TWICE DAILY, Disp: 360 tablet, Rfl: 3    DRUGS TO CAUTION IN HEART FAILURE  Drug or Class Mechanism  Analgesics . NSAIDs . COX-2 inhibitors . Glucocorticoids  Sodium and water retention, increased systemic vascular resistance, decreased response to diuretics   Diabetes Medications . Metformin . Thiazolidinediones o Rosiglitazone (Avandia) o Pioglitazone (Actos) . DPP4 Inhibitors o Saxagliptin (Onglyza) o Sitagliptin (Januvia)   Lactic acidosis Possible calcium channel blockade   Unknown  Antiarrhythmics . Class I  o Flecainide o Disopyramide . Class III o Sotalol . Other o Dronedarone  Negative inotrope, proarrhythmic   Proarrhythmic, beta blockade  Negative inotrope  Antihypertensives . Alpha  Blockers o Doxazosin . Calcium Channel Blockers o Diltiazem o Verapamil o Nifedipine . Central Alpha Adrenergics o Moxonidine . Peripheral Vasodilators o Minoxidil  Increases renin and aldosterone  Negative inotrope    Possible sympathetic withdrawal  Unknown  Anti-infective . Itraconazole . Amphotericin B  Negative inotrope Unknown  Hematologic . Anagrelide . Cilostazol   Possible inhibition of PD IV Inhibition of PD III causing arrhythmias  Neurologic/Psychiatric . Stimulants . Anti-Seizure Drugs o Carbamazepine o Pregabalin . Antidepressants o Tricyclics o Citalopram . Parkinsons o Bromocriptine o Pergolide o Pramipexole . Antipsychotics o Clozapine . Antimigraine o Ergotamine o Methysergide . Appetite suppressants . Bipolar o Lithium  Peripheral alpha and beta agonist activity  Negative inotrope and chronotrope Calcium channel blockade  Negative inotrope, proarrhythmic Dose-dependent QT prolongation  Excessive serotonin activity/valvular damage Excessive serotonin activity/valvular damage Unknown  IgE mediated  hypersensitivy, calcium channel blockade  Excessive serotonin activity/valvular damage Excessive serotonin activity/valvular damage Valvular damage  Direct myofibrillar degeneration, adrenergic stimulation  Antimalarials . Chloroquine . Hydroxychloroquine Intracellular inhibition of lysosomal enzymes  Urologic Agents . Alpha Blockers o Doxazosin o Prazosin o Tamsulosin o Terazosin  Increased renin and aldosterone  Adapted from Page RL, et al. "Drugs That May Cause or Exacerbate Heart Failure: A Scientific Statement from the American Heart  Association." Circulation 2016; 134:e32-e69. DOI: 10.1161/CIR.0000000000000426   MEDICATION ADHERENCES TIPS AND STRATEGIES 1. Taking medication as prescribed improves patient outcomes in heart failure (reduces hospitalizations, improves symptoms, increases survival) 2. Side effects of  medications can be managed by decreasing doses, switching agents, stopping drugs, or adding additional therapy. Please let someone in the Heart Failure Clinic know if you have having bothersome side effects so we can modify your regimen. Do not alter your medication regimen without talking to [place].  3. Medication reminders can help patients remember to take drugs on time. If you are missing or forgetting doses you can try linking behaviors, using pill boxes, or an electronic reminder like an alarm on your phone or an app. Some people can also get automated phone calls as medication reminders.

## 2021-01-26 ENCOUNTER — Other Ambulatory Visit: Payer: Self-pay | Admitting: Family

## 2021-01-26 DIAGNOSIS — I5032 Chronic diastolic (congestive) heart failure: Secondary | ICD-10-CM

## 2021-02-07 ENCOUNTER — Other Ambulatory Visit: Payer: Self-pay | Admitting: Cardiovascular Disease

## 2021-02-10 ENCOUNTER — Ambulatory Visit
Admission: RE | Admit: 2021-02-10 | Discharge: 2021-02-10 | Disposition: A | Discharge status: Home / Self Care | Payer: Managed Care, Other (non HMO) | Source: Ambulatory Visit | Attending: Family | Admitting: Family

## 2021-02-10 ENCOUNTER — Other Ambulatory Visit: Payer: Self-pay

## 2021-02-10 DIAGNOSIS — E119 Type 2 diabetes mellitus without complications: Secondary | ICD-10-CM | POA: Insufficient documentation

## 2021-02-10 DIAGNOSIS — I110 Hypertensive heart disease with heart failure: Secondary | ICD-10-CM | POA: Insufficient documentation

## 2021-02-10 DIAGNOSIS — I340 Nonrheumatic mitral (valve) insufficiency: Secondary | ICD-10-CM | POA: Diagnosis not present

## 2021-02-10 DIAGNOSIS — I5032 Chronic diastolic (congestive) heart failure: Secondary | ICD-10-CM

## 2021-02-10 LAB — ECHOCARDIOGRAM COMPLETE
AR max vel: 1.74 cm2
AV Area VTI: 2.06 cm2
AV Area mean vel: 1.66 cm2
AV Mean grad: 4.5 mmHg
AV Peak grad: 8.8 mmHg
Ao pk vel: 1.48 m/s
Area-P 1/2: 3.72 cm2
S' Lateral: 2.45 cm

## 2021-02-10 NOTE — Progress Notes (Signed)
*  PRELIMINARY RESULTS* Echocardiogram 2D Echocardiogram has been performed.  Heather Norman 02/10/2021, 11:59 AM

## 2021-03-29 ENCOUNTER — Ambulatory Visit
Admission: RE | Admit: 2021-03-29 | Discharge: 2021-03-29 | Disposition: A | Discharge status: Home / Self Care | Payer: Managed Care, Other (non HMO) | Source: Ambulatory Visit | Attending: Physician Assistant | Admitting: Physician Assistant

## 2021-03-29 ENCOUNTER — Encounter: Payer: Self-pay | Admitting: Cardiovascular Disease

## 2021-03-29 ENCOUNTER — Ambulatory Visit (INDEPENDENT_AMBULATORY_CARE_PROVIDER_SITE_OTHER): Payer: Managed Care, Other (non HMO) | Admitting: Cardiovascular Disease

## 2021-03-29 ENCOUNTER — Other Ambulatory Visit: Payer: Self-pay

## 2021-03-29 VITALS — BP 110/70 | HR 78 | Ht 65.0 in | Wt 291.0 lb

## 2021-03-29 DIAGNOSIS — I5032 Chronic diastolic (congestive) heart failure: Secondary | ICD-10-CM | POA: Diagnosis not present

## 2021-03-29 DIAGNOSIS — I493 Ventricular premature depolarization: Secondary | ICD-10-CM

## 2021-03-29 DIAGNOSIS — E119 Type 2 diabetes mellitus without complications: Secondary | ICD-10-CM | POA: Diagnosis not present

## 2021-03-29 DIAGNOSIS — R002 Palpitations: Secondary | ICD-10-CM

## 2021-03-29 DIAGNOSIS — Z1231 Encounter for screening mammogram for malignant neoplasm of breast: Secondary | ICD-10-CM | POA: Diagnosis present

## 2021-03-29 DIAGNOSIS — R931 Abnormal findings on diagnostic imaging of heart and coronary circulation: Secondary | ICD-10-CM

## 2021-03-29 DIAGNOSIS — E785 Hyperlipidemia, unspecified: Secondary | ICD-10-CM

## 2021-03-29 DIAGNOSIS — I10 Essential (primary) hypertension: Secondary | ICD-10-CM | POA: Diagnosis not present

## 2021-03-29 NOTE — Patient Instructions (Addendum)
We have referred you to Dr. Lalla Brothers, our EP (electrophysiology) cardiac specialist regarding our reduce EF and frequent PVCs.   Medication Instructions:  No changes  If you need a refill on your cardiac medications before your next appointment, please call your pharmacy.   Lab work: No new labs needed  Testing/Procedures: No new testing needed  Follow-Up: At [facility], you and your health needs are our priority.  As part of our continuing mission to provide you with exceptional heart care, we have created designated Provider Care Teams.  These Care Teams include your primary Cardiologist (physician) and Advanced Practice Providers (APPs -  Physician Assistants and Nurse Practitioners) who all work together to provide you with the care you need, when you need it.  You will need a follow up appointment in 12 months  Providers on your designated Care Team:   Nicolasa Ducking, NP Eula Listen, PA-C Marisue Ivan, PA-C Cadence [place], [place]  COVID-19 Vaccine Information can be found at: [email or web address] For questions related to vaccine distribution or appointments, please email [email or web address] or call [phone].

## 2021-03-29 NOTE — Progress Notes (Signed)
Date:  03/29/2021   ID:  [name], DOB 09/30/1975, MRN [id number]  Patient Location:  [address] [name] [address]   Provider location:   [facility], [place] office  PCP:  Santiago Bur, PA  Cardiologist:  Hubbard [facility]  Chief Complaint  Patient presents with   6- month follow up     "Doing well." Medications reviewed by the patient verbally.      History of Present Illness:    Heather Norman is a 45 y.o. 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  last seen in clinic February 2022,  More GERD symptoms recently Eating lots of fruit, PPI dose increased, lots of belching  Reviewed prior event monitor, sum total 15% burden PVCs She reports having some symptoms  Onset of PVCs appears to be within the past year Recent echocardiogram 2 months ago with mild drop in ejection fraction now down to 50 to 55% Prior echocardiogram 60 to 65%  On torsemide 40 mg twice daily Stable dose since April 2021  No regular exercise program  EKG personally reviewed by myself on todays visit Normal sinus rhythm rate 78 bpm, PVCs Long rhythm strip with PVCs in a bigeminal pattern  Other past medical history reviewed 11/2017 ER for hypertensive urgency and volume overload.  This occurred in the setting of dietary indiscretion.  Her weight was 278 pounds that day and at that time she was on Lasix 40 mg twice daily.   Given IV lasix and released   02/2018:ER visit  leg swelling, hypertensive at 175/112.   dietary indiscretions, frequently eating [employer] sandwiches for lunch Given IV lasix and released    Past Medical History:  Diagnosis Date   (HFpEF) heart failure with preserved ejection fraction (HCC)    a. 04/2017 Echo: EF 55-60%, no rwma, Mild MR,  mildly dil LA. Nl RV fxn; b. 06/2019 Echo: EF 60-65%, mod LVH. Gr2 DD. Mildly dil LA.   Arthritis    kness/hands - no meds   CHF (congestive heart failure) (HCC)    Diabetes mellitus without complication (HCC)    Dyspnea on exertion    Headache(784.0)    otc med prn   Heart murmur    Hyperlipidemia    no meds since pregnancy   Hypertension    Morbid obesity [facility])    Past Surgical History:  Procedure Laterality Date   CERVICAL CERCLAGE  06/01/2011   Procedure: CERCLAGE CERVICAL;  Surgeon: Philip Aspen, DO;  Location: [facility];  Service: Gynecology;  Laterality: N/A;  REQUESTING PORTABLE ULTARSOUND AT BEDSIDE PT'S EDC:12/02/2011   CERVICAL CERCLAGE  06/01/2011   Procedure: CERCLAGE CERVICAL;  Surgeon: Philip Aspen, DO;  Location: [facility];  Service: Gynecology;  Laterality: N/A;  REQUESTING PORTABLE ULTARSOUND AT BEDSIDE PT'S EDC:12/02/2011   endosocpy     svd     x 2   tab     x 1   TUBAL LIGATION       Current Meds  Medication Sig   albuterol (PROVENTIL HFA;VENTOLIN HFA) 108 (90 Base) MCG/ACT inhaler Inhale 2 puffs into the lungs every 4 (four) hours as needed for wheezing or shortness of breath. q4-6 h PRN for shortness of breath   aspirin EC 81 MG tablet Take 81 mg by mouth  daily.   atorvastatin (LIPITOR) 20 MG tablet Take 1 tablet (20 mg total) by mouth daily.   carvedilol (COREG) 25 MG tablet Take 1 tablet in the am & 1.5 tablet in the pm.   ENTRESTO 49-51 MG TAKE 1 TABLET BY MOUTH TWICE DAILY   fluticasone (FLONASE) 50 MCG/ACT nasal spray Place 1 spray into both nostrils daily as needed for allergies or rhinitis.   JARDIANCE 10 MG TABS tablet Take 10 mg by mouth daily.   lidocaine (LIDODERM) 5 % Place 1 patch onto the skin as needed.   loratadine (CLARITIN) 10 MG tablet Take 10 mg by mouth daily. Takes daily   oxybutynin (DITROPAN) 5 MG tablet Take 5 mg by mouth 2 (two) times daily.   pantoprazole (PROTONIX) 40 MG tablet Take 40 mg by mouth daily.   potassium chloride  20 MEQ/15ML (10%) SOLN Take 58.5 mLs (78 mEq total) by mouth daily. Take 53ml daily (Patient taking differently: Take 80 mEq by mouth daily. Pt takes 20ml daily)   spironolactone (ALDACTONE) 25 MG tablet TAKE 1 TABLET(25 MG) BY MOUTH DAILY   torsemide (DEMADEX) 20 MG tablet TAKE 2 TABLETS(40 MG) BY MOUTH TWICE DAILY     Allergies:   Penicillins   Social History   Tobacco Use   Smoking status: Never   Smokeless tobacco: Never  Vaping Use   Vaping Use: Never used  Substance Use Topics   Alcohol use: No   Drug use: No      Family Hx: The patient's family history includes Breast cancer (age of onset: 71) in her maternal grandmother; Hyperlipidemia in her mother; Hypertension in her father and mother; Stroke in her mother.  ROS:   Please see the history of present illness.    Review of Systems  Constitutional: Negative.   Respiratory:  Positive for shortness of breath.   Cardiovascular: Negative.   Gastrointestinal: Negative.   Musculoskeletal: Negative.   Neurological: Negative.   Psychiatric/Behavioral: Negative.    All other systems reviewed and are negative.   Labs/Other Tests and Data Reviewed:    Recent Labs: 06/05/2020: ALT 26 10/11/2020: B Natriuretic Peptide 238.9; BUN 14; Creatinine, Ser 1.09; Hemoglobin 13.5; Platelets 313; Potassium 3.5; Sodium 139   Recent Lipid Panel Lab Results  Component Value Date/Time   CHOL 219 (H) 06/05/2020 09:14 AM   CHOL 168 01/14/2015 10:51 AM   CHOL 199 12/13/2012 01:06 AM   TRIG 133 06/05/2020 09:14 AM   TRIG 84 12/13/2012 01:06 AM   HDL 48 06/05/2020 09:14 AM   HDL 42 01/14/2015 10:51 AM   HDL 52 12/13/2012 01:06 AM   CHOLHDL 4.6 06/05/2020 09:14 AM   LDLCALC 144 (H) 06/05/2020 09:14 AM   LDLCALC 106 (H) 01/14/2015 10:51 AM   LDLCALC 130 (H) 12/13/2012 01:06 AM    Wt Readings from Last 3 Encounters:  03/29/21 291 lb (132 kg)  01/03/21 285 lb (129.3 kg)  10/14/20 285 lb 8 oz (129.5 kg)     Exam:    Vital Signs:  BP  110/70 (BP Location: Left Arm, Patient Position: Sitting, Cuff Size: Large)   Pulse 78   Ht 5\' 5"  (1.651 m)   Wt 291 lb (132 kg)   SpO2 98%   BMI 48.42 kg/m  Constitutional:  oriented to person, place, and time. No distress.  HENT:  Head: Grossly normal Eyes:  no discharge. No scleral icterus.  Neck: No JVD, no carotid bruits  Cardiovascular: Regular rate and rhythm, no murmurs appreciated Pulmonary/Chest:  Clear to auscultation bilaterally, no wheezes or rails Abdominal: Soft.  no distension.  no tenderness.  Musculoskeletal: Normal range of motion Neurological:  normal muscle tone. Coordination normal. No atrophy Skin: Skin warm and dry Psychiatric: normal affect, pleasant   ASSESSMENT & PLAN:    Chronic diastolic CHF (congestive heart failure) (HCC) Continue torsemide 40 twice daily, euvolemic Continue other medications including Entresto, spironolactone, Coreg  Frequent PVCs Noted on monitor, 15% burden Drop in EF now 50%, was 60% PVC most notable over past 1-2 years Will refer to EP for discussion concerning various treatment options  Type 2 diabetes mellitus without complication, without long-term current use of insulin (HCC) Strict diet recommended, walking program We have encouraged continued exercise, careful diet management in an effort to lose weight.  Morbid obesity (HCC) Recommended low carbohydrate diet  Essential hypertension Blood pressure elevated in the setting of 10 pound weight gain, poor diet Diuretic changes as above should help her blood pressure  Right knee pain Likely arthritis, cortisone, better now   Total encounter time more than 25 minutes  Greater than 50% was spent in counseling and coordination of care with the patient    Signed, Julien Nordmann, MD  03/29/2021 3:02 PM    [facility] [address], [place], [place] [address]

## 2021-04-02 ENCOUNTER — Ambulatory Visit
Admission: EM | Admit: 2021-04-02 | Discharge: 2021-04-02 | Disposition: A | Discharge status: Home / Self Care | Payer: Managed Care, Other (non HMO) | Attending: Emergency Medicine | Admitting: Emergency Medicine

## 2021-04-02 ENCOUNTER — Other Ambulatory Visit: Payer: Self-pay

## 2021-04-02 ENCOUNTER — Encounter: Payer: Self-pay | Admitting: Emergency Medicine

## 2021-04-02 DIAGNOSIS — B349 Viral infection, unspecified: Secondary | ICD-10-CM

## 2021-04-02 DIAGNOSIS — U071 COVID-19: Secondary | ICD-10-CM | POA: Diagnosis not present

## 2021-04-02 MED ORDER — MOLNUPIRAVIR EUA 200MG CAPSULE: 4.0000 | ORAL_CAPSULE | Freq: Two times a day (BID) | ORAL | 0 refills | Status: AC

## 2021-04-02 NOTE — ED Provider Notes (Signed)
CHIEF COMPLAINT:   Chief Complaint  Patient presents with   Cough     SUBJECTIVE/HPI:  HPI A very pleasant 45 y.o.Female presents today with cough, sore throat, headache, chills and chest soreness with coughing.  Patient reports a positive COVID test at home this morning.  She reports being exposed to someone with COVID-19 on Tuesday.  Patient reports that her symptoms started on Thursday. Patient does not report any shortness of breath, chest pain, palpitations, visual changes, weakness, tingling, headache, nausea, vomiting, diarrhea, fever.   has a past medical history of (HFpEF) heart failure with preserved ejection fraction (HCC), Arthritis, CHF (congestive heart failure) (HCC), Diabetes mellitus without complication (HCC), Dyspnea on exertion, Headache(784.0), Heart murmur, Hyperlipidemia, Hypertension, and Morbid obesity (HCC).  ROS:  Review of Systems See Subjective/HPI Medications, Allergies and Problem List personally reviewed in Epic today OBJECTIVE:   Vitals:   04/02/21 0840  BP: 104/66  Pulse: (!) 51  Resp: (!) 22  Temp: 99.6 F (37.6 C)  SpO2: 95%    Physical Exam   General: Appears well-developed and well-nourished. No acute distress.  HEENT Head: Normocephalic and atraumatic.   Ears: Hearing grossly intact, no drainage or visible deformity.  Nose: No nasal deviation.   Mouth/Throat: No stridor or tracheal deviation.  Non erythematous posterior pharynx noted with clear drainage present.  No white patchy exudate noted. Eyes: Conjunctivae and EOM are normal. No eye drainage or scleral icterus bilaterally.  Neck: Normal range of motion, neck is supple.  Cardiovascular: Normal rate. Regular rhythm; no murmurs, gallops, or rubs.  Pulm/Chest: No respiratory distress. Breath sounds normal bilaterally without wheezes, rhonchi, or rales.  Neurological: Alert and oriented to person, place, and time.  Skin: Skin is warm and dry.  No rashes, lesions, abrasions or bruising  noted to skin.   Psychiatric: Normal mood, affect, behavior, and thought content.   Vital signs and nursing note reviewed.   Patient stable and cooperative with examination. PROCEDURES:    LABS/X-RAYS/EKG/MEDS:   No results found for any visits on 04/02/21.  MEDICAL DECISION MAKING:   Patient presents with cough, sore throat, headache, chills and chest soreness with coughing.  Patient reports a positive COVID test at home this morning.  She reports being exposed to someone with COVID-19 on Tuesday.  Patient reports that her symptoms started on Thursday. Patient does not report any shortness of breath, chest pain, palpitations, visual changes, weakness, tingling, headache, nausea, vomiting, diarrhea, fever.  Chart review completed.  Patient tested positive for COVID-19 at home, but reports that she does need a positive test through [place] for confirmation to give to her work.  Advised that we will call with her positive results.  Given that she tested positive at home did Rx molnupirovir to her preferred pharmacy.  Patient does have a history of CHF and diabetes.  Advised about home treatment and care with Tylenol versus ibuprofen, Mucinex, Sudafed, Afrin, rest, fluids.  Return with any new high fever not improving with medications, chest pain, difficulty breathing, nonstop vomiting or coughing up blood.  Follow-up with PCP if she does not improve over the next 5 to 7 days.  Return as needed.  Patient verbalized understanding and agreed with treatment plan.  Patient stable upon discharge. ASSESSMENT/PLAN:  1. Viral illness  2. COVID-19 - Novel Coronavirus, NAA ([facility]); Standing - Novel Coronavirus, NAA ([facility])  Meds ordered this encounter  Medications   molnupiravir EUA 200 mg CAPS    Sig: Take 4 capsules (800 mg total)  by mouth 2 (two) times daily for 5 days.    Dispense:  40 capsule    Refill:  0    Order Specific Question:   Supervising Provider    Answer:   Merrilee Jansky  [id number]    Instructions about new medications and side effects provided.  Plan:   Discharge Instructions      We will call you with any positive results from your COVID-19 testing completed in clinic today.  If you do not receive a phone call from [place] within the next 2-3 days, check your MyChart for up-to-date health information related to testing completed in clinic today.   For most people this is a self-limiting process and can take anywhere from 7 - 10 days to start feeling better. A cough can last up to 3 weeks. Pay special attention to handwashing as this can help prevent the spread of the virus.   Always read the labels of cough and cold medications as they may contain some of the ingredients below.  Rest, push lots of fluids (especially water), and utilize supportive care for symptoms. You may take acetaminophen (Tylenol) every 4-6 hours and ibuprofen every 6-8 hours for muscle pain, joint pain, headaches (you may also alternate these medications). Mucinex (guaifenesin) may be taken over the counter for cough as needed can loosen phlegm. Please read the instructions and take as directed.  Sudafed (pseudophedrine) is sold behind the counter and can help reduce nasal pressure; avoid taking this if you have high blood pressure or feel jittery. Sudafed PE (phenylephrine) can be a helpful, short-term, over-the-counter alternative to limit side effects or if you have high blood pressure.  Flonase nasal spray can help alleviate congestion and sinus pressure. Many patients choose Afrin as a nasal decongestant; do not use for more than 3 days for risk of rebound (increased symptoms after stopping medication).  Saline nasal sprays or rinses can also help nasal congestion (use bottled or sterile water). Warm tea with lemon and honey can sooth sore throat and cough, as can cough drops.   Return to clinic for high fever not improving with medications, chest pain, difficulty breathing, non-stop  vomiting, or coughing blood. Follow-up with your primary care provider if symptoms do not improve as expected in the next 5-7 days.          Amalia Greenhouse, [place] 04/02/21 [phone]

## 2021-04-02 NOTE — Discharge Instructions (Addendum)

## 2021-04-02 NOTE — ED Triage Notes (Signed)
Cough, sore throat, headache, chills, chest soreness with coughing.  Exposure to covid positive family member on Tuesday.  Patient tested at home and has had a positive test

## 2021-04-04 LAB — NOVEL CORONAVIRUS, NAA: SARS-CoV-2, NAA: DETECTED — AB

## 2021-04-04 LAB — SARS-COV-2, NAA 2 DAY TAT

## 2021-04-04 NOTE — Progress Notes (Deleted)
Patient ID: Heather Norman, female    DOB: 30-Sep-1975, 45 y.o.   MRN: [id number]  HPI  Heather Norman is a 45 y/o female with a history of arthritis, DM, hyperlipidemia, HTN, morbid obesity and chronic heart failure.  Echo report from 02/10/21 reviewed and showed an EF of 50-55% along with mild MR. Echo report from 06/16/2019 reviewed and showed an EF of 60-65% along with moderately increased LVH. Echo report from 05/16/17 reviewed and showed an EF of 55-60% along with mild MR. Reviewed echo report from 05/15/14 which showed an EF of 60-65% along with mild MR and normal PA pressure.  Was in the ED 10/11/20 due to shortness of breath and cough due to acute HF exacerbation. IV lasix given with resultant output of 900 cc with improvement of symptoms and she was released.   She presents today for a follow-up visit with a chief complaint of  She notes that she's feeling quite anxious and depressed as her oldest son was diagnosed with multiple sclerosis ~ 2 months ago. She also experienced 2 deaths close to this same time. She says that she's not sleeping well at all and is planning on making an appointment with her PCP to discuss treatment with medication.   Past Medical History:  Diagnosis Date   (HFpEF) heart failure with preserved ejection fraction (HCC)    a. 04/2017 Echo: EF 55-60%, no rwma, Mild MR, mildly dil LA. Nl RV fxn; b. 06/2019 Echo: EF 60-65%, mod LVH. Gr2 DD. Mildly dil LA.   Arthritis    kness/hands - no meds   CHF (congestive heart failure) (HCC)    Diabetes mellitus without complication (HCC)    Dyspnea on exertion    Headache(784.0)    otc med prn   Heart murmur    Hyperlipidemia    no meds since pregnancy   Hypertension    Morbid obesity [facility])    Past Surgical History:  Procedure Laterality Date   CERVICAL CERCLAGE  06/01/2011   Procedure: CERCLAGE CERVICAL;  Surgeon: Philip Aspen, DO;  Location: [facility];  Service: Gynecology;  Laterality: N/A;  REQUESTING PORTABLE  ULTARSOUND AT BEDSIDE PT'S EDC:12/02/2011   CERVICAL CERCLAGE  06/01/2011   Procedure: CERCLAGE CERVICAL;  Surgeon: Philip Aspen, DO;  Location: [facility];  Service: Gynecology;  Laterality: N/A;  REQUESTING PORTABLE ULTARSOUND AT BEDSIDE PT'S EDC:12/02/2011   endosocpy     svd     x 2   tab     x 1   TUBAL LIGATION     Family History  Problem Relation Age of Onset   Stroke Mother    Hypertension Mother    Hyperlipidemia Mother    Hypertension Father    Breast cancer Maternal Grandmother 80   Social History   Tobacco Use   Smoking status: Never   Smokeless tobacco: Never  Substance Use Topics   Alcohol use: No   Allergies  Allergen Reactions   Penicillins Anaphylaxis    Has patient had a PCN reaction causing immediate rash, facial/tongue/throat swelling, SOB or lightheadedness with hypotension: Yes Has patient had a PCN reaction causing severe rash involving mucus membranes or skin necrosis: No Has patient had a PCN reaction that required hospitalization: No Has patient had a PCN reaction occurring within the last 10 years: No If all of the above answers are "NO", then may proceed with Cephalosporin use.    Review of Systems  Constitutional:  Positive for fatigue (easily). Negative for appetite change.  HENT:  Negative for congestion, postnasal drip and sore throat.   Eyes: Negative.   Respiratory:  Positive for shortness of breath (when walking up steps). Negative for cough and wheezing.   Cardiovascular:  Negative for chest pain, palpitations and leg swelling.  Gastrointestinal:  Negative for abdominal distention and abdominal pain.  Endocrine: Negative.   Genitourinary: Negative.   Musculoskeletal:  Positive for arthralgias (right knee). Negative for back pain and neck pain.  Skin:  Negative for rash and wound.  Allergic/Immunologic: Negative.   Neurological:  Positive for numbness (down right arm at times). Negative for dizziness and light-headedness.   Hematological:  Negative for adenopathy. Does not bruise/bleed easily.  Psychiatric/Behavioral:  Positive for dysphoric mood and sleep disturbance (not sleeping well; sleeping on 2 pillows). The patient is nervous/anxious.      Physical Exam Vitals and nursing note reviewed.  Constitutional:      Appearance: She is well-developed.  HENT:     Head: Normocephalic and atraumatic.  Neck:     Vascular: No JVD.  Cardiovascular:     Rate and Rhythm: Normal rate and regular rhythm.  Pulmonary:     Effort: Pulmonary effort is normal. No respiratory distress.     Breath sounds: No wheezing or rales.  Abdominal:     Palpations: Abdomen is soft.     Tenderness: There is no abdominal tenderness.  Musculoskeletal:     Cervical back: Normal range of motion and neck supple.     Right lower leg: No tenderness. No edema.     Left lower leg: No tenderness. No edema.  Skin:    General: Skin is warm and dry.  Neurological:     General: No focal deficit present.     Mental Status: She is alert and oriented to person, place, and time.  Psychiatric:        Mood and Affect: Mood normal.        Behavior: Behavior normal.   Assessment & Plan:   1: Chronic heart failure with preserved ejection fraction with structural changes- - NYHA class III - euvolemic today - weighing daily; reminded to call for an overnight weight gain of >2 pounds or a weekly weight gain of >5 pounds - weight 285 pounds from last visit 3 months ago - not adding salt to her food and is trying to follow a low sodium diet - encouraged her to be as active as possible - on GDMT of carvedilol, entresto, jardiance and spironolactone - doesn't notice any change in her fatigue with reduction in carvedilol  - BNP 10/11/20 was 238.9   2: HTN- - BP  - sees PCP [facility])  - BMP from 10/11/20 reviewed and shows sodium 139, potassium 3.5, creatinine 1.09 and GFR >60  3: PVC's- - saw cardiology Heather Norman)  03/29/21 - has worn Zio monitor in the past which showed predominantly NSR with frequent PVC's - HR ranging between 40-120's from apple watch record  4: Depression- - son was diagnosed with multiple sclerosis ~ 2 months ago and she admits that she's been anxious and depressed since that time - also experienced 2 deaths close to this time as well - emotional support offered and encouraged her to speak with PCP about possible treatment; emphasized that her mental health can certainly affect her physical health   Medication bottles reviewed.

## 2021-04-05 ENCOUNTER — Ambulatory Visit: Payer: Managed Care, Other (non HMO) | Admitting: Family

## 2021-04-11 ENCOUNTER — Other Ambulatory Visit: Payer: Self-pay

## 2021-04-11 ENCOUNTER — Telehealth: Payer: Self-pay | Admitting: Family

## 2021-04-11 ENCOUNTER — Ambulatory Visit: Payer: Managed Care, Other (non HMO) | Attending: Family | Admitting: Family

## 2021-04-11 ENCOUNTER — Encounter: Payer: Self-pay | Admitting: Family

## 2021-04-11 VITALS — BP 102/63 | HR 76 | Resp 20 | Ht 65.0 in | Wt 288.2 lb

## 2021-04-11 DIAGNOSIS — I110 Hypertensive heart disease with heart failure: Secondary | ICD-10-CM | POA: Insufficient documentation

## 2021-04-11 DIAGNOSIS — Z880 Allergy status to penicillin: Secondary | ICD-10-CM | POA: Diagnosis not present

## 2021-04-11 DIAGNOSIS — I5032 Chronic diastolic (congestive) heart failure: Secondary | ICD-10-CM | POA: Diagnosis not present

## 2021-04-11 DIAGNOSIS — I493 Ventricular premature depolarization: Secondary | ICD-10-CM | POA: Diagnosis not present

## 2021-04-11 DIAGNOSIS — Z8249 Family history of ischemic heart disease and other diseases of the circulatory system: Secondary | ICD-10-CM | POA: Diagnosis not present

## 2021-04-11 DIAGNOSIS — I10 Essential (primary) hypertension: Secondary | ICD-10-CM | POA: Diagnosis not present

## 2021-04-11 DIAGNOSIS — Z7189 Other specified counseling: Secondary | ICD-10-CM | POA: Diagnosis not present

## 2021-04-11 DIAGNOSIS — J3489 Other specified disorders of nose and nasal sinuses: Secondary | ICD-10-CM | POA: Diagnosis not present

## 2021-04-11 DIAGNOSIS — U071 COVID-19: Secondary | ICD-10-CM | POA: Insufficient documentation

## 2021-04-11 DIAGNOSIS — R42 Dizziness and giddiness: Secondary | ICD-10-CM | POA: Diagnosis not present

## 2021-04-11 DIAGNOSIS — R5383 Other fatigue: Secondary | ICD-10-CM | POA: Diagnosis not present

## 2021-04-11 DIAGNOSIS — R5381 Other malaise: Secondary | ICD-10-CM | POA: Insufficient documentation

## 2021-04-11 NOTE — Telephone Encounter (Signed)
See NP student J Woody's note from 04/11/21 to read HF Clinic visit information.

## 2021-04-11 NOTE — Progress Notes (Signed)
Patient ID: Heather Norman, female    DOB: Nov 14, 1975, 45 y.o.   MRN: [id number]  Congestive Heart Failure Associated symptoms include fatigue (easily), palpitations and shortness of breath (on exertion secondary to congestion). Pertinent negatives include no abdominal pain or chest pain.  Shortness of Breath Associated symptoms include rhinorrhea and a sore throat. Pertinent negatives include no abdominal pain, chest pain, fever, leg swelling, neck pain, rash or wheezing.   Heather Norman is a 45 y/o female with a history of arthritis, DM, hyperlipidemia, HTN, morbid obesity and chronic heart failure.  Echo report from 02/10/21 Left ventricular ejection fraction, by estimation, is 50 to 55%. The left ventricle has low normal function. The left ventricle has no regional wall motion abnormalities. Left ventricular diastolic parameters are consistent with Grade I diastolic dysfunction (impaired relaxation).  Recent ED visit on 04/02/21 due to COVID 19, was evaluated and discharged home.   She presents today for a follow-up visit of her heart failure symptoms. She reports she has been feeling relatively well, in spite of a recent diagnosis of COVID 19. She is off of isolation, but continues to have rhinorrhea, facial pressure, and generalized malaise. She denies any CP, SOB, increased weight gain, DOE.   Past Medical History:  Diagnosis Date   (HFpEF) heart failure with preserved ejection fraction (HCC)    a. 04/2017 Echo: EF 55-60%, no rwma, Mild MR, mildly dil LA. Nl RV fxn; b. 06/2019 Echo: EF 60-65%, mod LVH. Gr2 DD. Mildly dil LA.   Arthritis    kness/hands - no meds   CHF (congestive heart failure) (HCC)    Diabetes mellitus without complication (HCC)    Dyspnea on exertion    Headache(784.0)    otc med prn   Heart murmur    Hyperlipidemia    no meds since pregnancy   Hypertension    Morbid obesity [facility])    Past Surgical History:  Procedure Laterality Date   CERVICAL CERCLAGE   06/01/2011   Procedure: CERCLAGE CERVICAL;  Surgeon: Philip Aspen, DO;  Location: [facility];  Service: Gynecology;  Laterality: N/A;  REQUESTING PORTABLE ULTARSOUND AT BEDSIDE PT'S EDC:12/02/2011   CERVICAL CERCLAGE  06/01/2011   Procedure: CERCLAGE CERVICAL;  Surgeon: Philip Aspen, DO;  Location: [facility];  Service: Gynecology;  Laterality: N/A;  REQUESTING PORTABLE ULTARSOUND AT BEDSIDE PT'S EDC:12/02/2011   endosocpy     svd     x 2   tab     x 1   TUBAL LIGATION     Family History  Problem Relation Age of Onset   Stroke Mother    Hypertension Mother    Hyperlipidemia Mother    Hypertension Father    Breast cancer Maternal Grandmother 37   Social History   Tobacco Use   Smoking status: Never   Smokeless tobacco: Never  Substance Use Topics   Alcohol use: No   Allergies  Allergen Reactions   Penicillins Anaphylaxis    Has patient had a PCN reaction causing immediate rash, facial/tongue/throat swelling, SOB or lightheadedness with hypotension: Yes Has patient had a PCN reaction causing severe rash involving mucus membranes or skin necrosis: No Has patient had a PCN reaction that required hospitalization: No Has patient had a PCN reaction occurring within the last 10 years: No If all of the above answers are "NO", then may proceed with Cephalosporin use.   Prior to Admission medications   Medication Sig Start Date End Date Taking? Authorizing Provider  albuterol (PROVENTIL HFA;VENTOLIN  HFA) 108 (90 Base) MCG/ACT inhaler Inhale 2 puffs into the lungs every 4 (four) hours as needed for wheezing or shortness of breath. q4-6 h PRN for shortness of breath   Yes [provider]  aspirin EC 81 MG tablet Take 81 mg by mouth daily.   Yes [provider]  atorvastatin (LIPITOR) 20 MG tablet Take 1 tablet (20 mg total) by mouth daily. 06/07/20 09/05/20 Yes Alver Sorrow, NP  carvedilol (COREG) 25 MG tablet Take 1.5 tablets (37.5 mg total) by mouth 2 (two) times daily  with a meal. Patient taking differently: Take by mouth 2 (two) times daily with a meal. 1.5 tablets in the morning and 1 tablets in the evening 05/26/20  Yes Alver Sorrow, NP  ENTRESTO 49-51 MG TAKE 1 TABLET BY MOUTH TWICE DAILY 05/24/20  Yes [name], FNP  fluticasone (FLONASE) 50 MCG/ACT nasal spray Place 1 spray into both nostrils daily as needed for allergies or rhinitis.   Yes [provider]  JARDIANCE 10 MG TABS tablet Take 10 mg by mouth daily. 10/10/19  Yes [provider]  lidocaine (LIDODERM) 5 % Place 1 patch onto the skin as needed. 07/09/20  Yes [provider]  loratadine (CLARITIN) 10 MG tablet Take 10 mg by mouth daily. Takes daily   Yes [provider]  oxybutynin (DITROPAN) 5 MG tablet Take 5 mg by mouth 2 (two) times daily. 12/25/17  Yes [provider]  pantoprazole (PROTONIX) 20 MG tablet Take 1 tablet (20 mg total) by mouth daily. Patient taking differently: Take 40 mg by mouth daily. 04/12/20  Yes Bridget Hartshorn L, PA-C  potassium chloride 20 MEQ/15ML (10%) SOLN Take 58.5 mLs (78 mEq total) by mouth daily. Take 11ml daily Patient taking differently: Take 80 mEq by mouth daily. Pt takes 86ml daily 07/09/20  Yes [name], FNP  spironolactone (ALDACTONE) 25 MG tablet TAKE 1 TABLET(25 MG) BY MOUTH DAILY 01/16/20  Yes Gollan, Tollie Pizza, MD  torsemide (DEMADEX) 20 MG tablet TAKE 2 TABLETS(40 MG) BY MOUTH TWICE DAILY 01/16/20  Yes Gollan, Tollie Pizza, MD   Review of Systems  Constitutional:  Positive for fatigue (easily). Negative for appetite change and fever.  HENT:  Positive for congestion, postnasal drip, rhinorrhea, sinus pressure, sneezing and sore throat.   Eyes: Negative.   Respiratory:  Positive for shortness of breath (on exertion secondary to congestion). Negative for cough, chest tightness and wheezing.   Cardiovascular:  Positive for palpitations. Negative for chest pain and leg swelling.  Gastrointestinal:   Negative for abdominal distention and abdominal pain.  Endocrine: Negative.   Genitourinary: Negative.   Musculoskeletal:  Positive for arthralgias (right knee) and joint swelling (right knee). Negative for back pain and neck pain.  Skin:  Negative for rash and wound.  Allergic/Immunologic: Negative.   Neurological:  Positive for dizziness (occasional dizziness when coughing and changing positions) and numbness (down right arm at times). Negative for syncope and light-headedness.  Hematological:  Negative for adenopathy. Does not bruise/bleed easily.  Psychiatric/Behavioral:  Positive for sleep disturbance (not sleeping well; sleeping on 2 pillows). Negative for dysphoric mood. The patient is not nervous/anxious.   Vitals:   04/11/21 0907  BP: 102/63  Pulse: 76  Resp: 20  SpO2: 95%  Weight: 288 lb 4 oz (130.7 kg)  Height: 5\' 5"  (1.651 m)   Wt Readings from Last 3 Encounters:  04/11/21 288 lb 4 oz (130.7 kg)  03/29/21 291 lb (132 kg)  01/03/21 285 lb (129.3 kg)   Lab Results  Component Value Date   CREATININE 1.09 (H) 10/11/2020   CREATININE 1.37 (H) 06/05/2020   CREATININE 1.31 (H) 04/12/2020    Physical Exam Vitals and nursing note reviewed.  Constitutional:      Appearance: She is well-developed. She is obese.  HENT:     Head: Normocephalic and atraumatic.  Neck:     Vascular: No JVD.  Cardiovascular:     Rate and Rhythm: Normal rate and regular rhythm.     Heart sounds: Normal heart sounds. No murmur heard.   No gallop.     Comments: No noted PVCs during exam today  Pulmonary:     Effort: Pulmonary effort is normal. No respiratory distress.     Breath sounds: Normal breath sounds. No wheezing or rales.  Abdominal:     Palpations: Abdomen is soft.     Tenderness: There is no abdominal tenderness.  Musculoskeletal:     Cervical back: Normal range of motion and neck supple.     Right lower leg: No tenderness. No edema.     Left lower leg: No tenderness. No edema.   Skin:    General: Skin is warm and dry.  Neurological:     General: No focal deficit present.     Mental Status: She is alert and oriented to person, place, and time.  Psychiatric:        Mood and Affect: Mood normal.        Behavior: Behavior normal.   Assessment & Plan:   1: Chronic heart failure with preserved ejection fraction with structural changes- - NYHA class II - euvolemic today - weighing daily, although she has not weighed in several days due to malaise from COVID; reminded to call for an overnight weight gain of >2 pounds or a weekly weight gain of >5 pounds - weight is down 3 lbs  - not adding salt to her food and is trying to follow a low sodium diet - encouraged her to be as active as possible - on GDMT of carvedilol, entresto, jardiance and spironolactone. Cardiology recently adjusted her carvedilol to help with PVCs.  - BNP 10/11/20 was 238.9  2: HTN- - BP 102/63, she does report some dizziness with postural changes but they subside quickly. She is aware to change positions slowly. - sees PCP [facility])  - BMP from 10/11/20 reviewed and shows sodium 139, potassium 3.5, creatinine 1.09 and GFR >60  3: PVC's- - has consultation with EP MD on 04/27/21 - has worn Zio monitor in the past which showed predominantly NSR with frequent PVC's - HR ranging between 40-120's from apple watch record - tolerating change of carvedilol  - no noted PVCs during exam today   4: COVID 19 - evaluated in the ED and returned home - off of quarantine and has returned to work - continues to be fatigued  - she is having copious rhinorrhea and facial pressure - encouraged mucinex, moist heat and continuing to consume 64 ounces of water     Medication bottles reviewed.   Return in 3 months or sooner for any questions/problems before then.

## 2021-04-11 NOTE — Patient Instructions (Signed)
Ok to take mucinex for congestion and cough. Increase your water intake while taking mucinex.  Use moist humidifier for sleep at night.   Return in 3 months.

## 2021-04-13 ENCOUNTER — Other Ambulatory Visit: Payer: Self-pay | Admitting: Cardiovascular Disease

## 2021-04-20 ENCOUNTER — Other Ambulatory Visit: Payer: Self-pay

## 2021-04-20 ENCOUNTER — Ambulatory Visit
Admission: RE | Admit: 2021-04-20 | Discharge: 2021-04-20 | Disposition: A | Discharge status: Home / Self Care | Payer: Managed Care, Other (non HMO) | Source: Ambulatory Visit | Attending: Emergency Medicine | Admitting: Emergency Medicine

## 2021-04-20 ENCOUNTER — Ambulatory Visit (INDEPENDENT_AMBULATORY_CARE_PROVIDER_SITE_OTHER): Payer: Managed Care, Other (non HMO)

## 2021-04-20 VITALS — BP 131/111 | HR 66 | Temp 98.1°F | Resp 18

## 2021-04-20 DIAGNOSIS — R059 Cough, unspecified: Secondary | ICD-10-CM

## 2021-04-20 DIAGNOSIS — J22 Unspecified acute lower respiratory infection: Secondary | ICD-10-CM

## 2021-04-20 MED ORDER — ALBUTEROL SULFATE HFA 108 (90 BASE) MCG/ACT IN AERS: 1.0000 | INHALATION_SPRAY | Freq: Four times a day (QID) | RESPIRATORY_TRACT | 0 refills | Status: DC | PRN

## 2021-04-20 MED ORDER — DOXYCYCLINE HYCLATE 100 MG PO CAPS: 100.0000 mg | ORAL_CAPSULE | Freq: Two times a day (BID) | ORAL | 0 refills | Status: DC

## 2021-04-20 MED ORDER — PROMETHAZINE-DM 6.25-15 MG/5ML PO SYRP: 5.0000 mL | ORAL_SOLUTION | Freq: Three times a day (TID) | ORAL | 0 refills | Status: DC | PRN

## 2021-04-20 NOTE — ED Triage Notes (Addendum)
Patient has a cough, this cough takes her breath away.  Pain in chest ears and throat only when coughing.   Patient was instructed to take mucinex.  Patient reports having this cough since having covid 9/3.  Tussionex makes her feel swimmy headed-stopped this med one week ago

## 2021-04-20 NOTE — ED Provider Notes (Addendum)
CHIEF COMPLAINT:   Chief Complaint  Patient presents with   Appointment   Cough     SUBJECTIVE/HPI:  HPI A very pleasant 45 y.o.Female presents today with cough.  Patient reports that the cough takes her breath away.  Patient reports pain in her chest, ears and throat when coughing.  Patient was instructed to take Mucinex.  Patient reports having the cough since being positive for COVID-19 on 04/02/2021.  Patient reports that Tussionex makes her feel dizzy and states that she stopped taking this medication about a week ago. Patient does not report any shortness of breath, palpitations, visual changes, weakness, tingling, headache, nausea, vomiting, diarrhea, fever, chills.   has a past medical history of (HFpEF) heart failure with preserved ejection fraction (HCC), Arthritis, CHF (congestive heart failure) (HCC), Diabetes mellitus without complication (HCC), Dyspnea on exertion, Headache(784.0), Heart murmur, Hyperlipidemia, Hypertension, and Morbid obesity (HCC).  ROS:  Review of Systems See Subjective/HPI Medications, Allergies and Problem List personally reviewed in Epic today OBJECTIVE:   Vitals:   04/20/21 1854  BP: (!) 131/111  Pulse: 66  Resp: 18  Temp: 98.1 F (36.7 C)  SpO2: 98%    Physical Exam   General: Appears well-developed and well-nourished. No acute distress.  HEENT Head: Normocephalic and atraumatic.   Ears: Hearing grossly intact, no drainage or visible deformity.  Nose: No nasal deviation.   Mouth/Throat: No stridor or tracheal deviation.  Eyes: Conjunctivae and EOM are normal. No eye drainage or scleral icterus bilaterally.  Neck: Normal range of motion, neck is supple.  Cardiovascular: Normal rate. Regular rhythm; no murmurs, gallops, or rubs.  Pulm/Chest: No respiratory distress.  Lungs CTA bilateral upper lobes, bilateral lower lobes with some mild inspiratory wheezing and rhonchi.  Forceful cough noted with deep inspiration. Neurological: Alert and  oriented to person, place, and time.  Skin: Skin is warm and dry.  No rashes, lesions, abrasions or bruising noted to skin.   Psychiatric: Normal mood, affect, behavior, and thought content.   Vital signs and nursing note reviewed.   Patient stable and cooperative with examination. PROCEDURES:    LABS/X-RAYS/EKG/MEDS:   No results found for any visits on 04/20/21. Chest xray: As read by me, RLL opacity consistent with bacterial process. Diffuse reticular opacities to bilateral lower lobes. Over read pending. MEDICAL DECISION MAKING:   Patient presents with cough.  Patient reports that the cough takes her breath away.  Patient reports pain in her chest, ears and throat when coughing.  Patient was instructed to take Mucinex.  Patient reports having the cough since being positive for COVID-19 on 04/02/2021.  Patient reports that Tussionex makes her feel dizzy and states that she stopped taking this medication about a week ago. Patient does not report any shortness of breath, palpitations, visual changes, weakness, tingling, headache, nausea, vomiting, diarrhea, fever, chills.  Chest x-ray reveals right lower lobe opacity consistent with bacterial process.  There are diffuse reticular opacities to the bilateral lower lobes.  As read by me, overread pending.  Likely, lower respiratory tract infection.  Rx doxycycline, albuterol inhaler, Phenergan cough syrup.  Rest, fluids, Tylenol, ibuprofen.  Advised to return to clinic with new onset fever, difficulty breathing, chest pain, symptoms lasting longer than 3 to 4 weeks or bloody sputum.  Patient verbalized understanding and agreed with treatment plan.  Patient stable upon discharge. ASSESSMENT/PLAN:  1. Lower respiratory tract infection - doxycycline (VIBRAMYCIN) 100 MG capsule; Take 1 capsule (100 mg total) by mouth 2 (two) times daily.  Dispense: 20 capsule; Refill: 0 - albuterol (VENTOLIN HFA) 108 (90 Base) MCG/ACT inhaler; Inhale 1-2 puffs into the  lungs every 6 (six) hours as needed (cough).  Dispense: 6.7 g; Refill: 0 - promethazine-dextromethorphan (PROMETHAZINE-DM) 6.25-15 MG/5ML syrup; Take 5 mLs by mouth 3 (three) times daily as needed for cough.  Dispense: 118 mL; Refill: 0 Instructions about new medications and side effects provided.  Plan:   Discharge Instructions      Doxycycline as prescribed  Proair as prescribed  Phenergan cough syrup as prescribed  Rest, push lots of fluids (especially water), and utilize supportive care for symptoms. You may take take acetaminophen (Tylenol) every 4-6 hours or ibuprofen every 6-8 hours for muscle pain, joint pain, headaches. Mucinex (guaifenesin) may be taken over the counter for cough as needed and can loosen phlegm. Please read the instructions and take as directed. Saline nasal sprays to rinse congestion can help as well. Warm tea with lemon and honey can sooth sore throat and cough, as can cough drops.  Return to clinic for new-onset fever, difficulty breathing, chest pain, symptoms lasting >3 to 4 weeks, or bloody sputum.          Amalia Greenhouse, FNP 04/20/21 1908    Amalia Greenhouse, FNP 04/20/21 1909

## 2021-04-20 NOTE — Discharge Instructions (Addendum)
Doxycycline as prescribed  Proair as prescribed  Phenergan cough syrup as prescribed  Rest, push lots of fluids (especially water), and utilize supportive care for symptoms. You may take take acetaminophen (Tylenol) every 4-6 hours or ibuprofen every 6-8 hours for muscle pain, joint pain, headaches. Mucinex (guaifenesin) may be taken over the counter for cough as needed and can loosen phlegm. Please read the instructions and take as directed. Saline nasal sprays to rinse congestion can help as well. Warm tea with lemon and honey can sooth sore throat and cough, as can cough drops.  Return to clinic for new-onset fever, difficulty breathing, chest pain, symptoms lasting >3 to 4 weeks, or bloody sputum.

## 2021-04-27 ENCOUNTER — Ambulatory Visit (INDEPENDENT_AMBULATORY_CARE_PROVIDER_SITE_OTHER): Payer: Managed Care, Other (non HMO) | Admitting: Cardiology

## 2021-04-27 ENCOUNTER — Other Ambulatory Visit: Payer: Self-pay

## 2021-04-27 VITALS — BP 108/64 | HR 61 | Ht 65.0 in | Wt 290.0 lb

## 2021-04-27 DIAGNOSIS — I5032 Chronic diastolic (congestive) heart failure: Secondary | ICD-10-CM

## 2021-04-27 DIAGNOSIS — I493 Ventricular premature depolarization: Secondary | ICD-10-CM | POA: Diagnosis not present

## 2021-04-27 NOTE — Patient Instructions (Addendum)
Medication Instructions:  Your physician recommends that you continue on your current medications as directed. Please refer to the Current Medication list given to you today. *If you need a refill on your cardiac medications before your next appointment, please call your pharmacy*  Lab Work: None ordered. If you have labs (blood work) drawn today and your tests are completely normal, you will receive your results only by: MyChart Message (if you have MyChart) OR A paper copy in the mail If you have any lab test that is abnormal or we need to change your treatment, we will call you to review the results.  Testing/Procedures: Your physician has requested that you have an echocardiogram. Echocardiography is a painless test that uses sound waves to create images of your heart. It provides your doctor with information about the size and shape of your heart and how well your heart's chambers and valves are working. This procedure takes approximately one hour. There are no restrictions for this procedure.  Please schedule an ECHO in 9 months (June 2023)  Follow-Up: At [facility], you and your health needs are our priority.  As part of our continuing mission to provide you with exceptional heart care, we have created designated Provider Care Teams.  These Care Teams include your primary Cardiologist (physician) and Advanced Practice Providers (APPs -  Physician Assistants and Nurse Practitioners) who all work together to provide you with the care you need, when you need it.  Your next appointment:   Your physician wants you to follow-up in: 9 months (AFTER ECHO) with one of the following Advanced Practice Providers on your designated Care Team:   Nicolasa Ducking, NP Eula Listen, PA-C Marisue Ivan, PA-C Cadence Fransico Michael, PA-C You will receive a reminder letter in the mail two months in advance. If you don't receive a letter, please call our office to schedule the follow-up appointment.

## 2021-04-27 NOTE — Progress Notes (Signed)
Electrophysiology Office Note:    Date:  04/27/2021   ID:  [name], DOB 01-01-1976, MRN [id number]  PCP:  Santiago Bur, PA  [facility] Cardiologist:  Julien Nordmann, MD  [facility] Electrophysiologist:  Lanier Prude, MD   Referring MD: [name], MD   Chief Complaint: PVCs  History of Present Illness:    Heather Norman is a 45 y.o. female who presents for an evaluation of PVCs at the request of Dr. Mariah Milling. Their medical history includes chronic diastolic heart failure, diabetes, hyperlipidemia, hypertension and morbid obesity.  The patient was last seen by Dr. Mariah Milling March 29, 2021.  The patient does not appreciate her PVCs.  Her biggest complaint today is indigestion.  She is relatively inactive.  No syncope or presyncope.  Past Medical History:  Diagnosis Date   (HFpEF) heart failure with preserved ejection fraction (HCC)    a. 04/2017 Echo: EF 55-60%, no rwma, Mild MR, mildly dil LA. Nl RV fxn; b. 06/2019 Echo: EF 60-65%, mod LVH. Gr2 DD. Mildly dil LA.   Arthritis    kness/hands - no meds   CHF (congestive heart failure) (HCC)    Diabetes mellitus without complication (HCC)    Dyspnea on exertion    Headache(784.0)    otc med prn   Heart murmur    Hyperlipidemia    no meds since pregnancy   Hypertension    Morbid obesity [facility])     Past Surgical History:  Procedure Laterality Date   CERVICAL CERCLAGE  06/01/2011   Procedure: CERCLAGE CERVICAL;  Surgeon: Philip Aspen, DO;  Location: [facility];  Service: Gynecology;  Laterality: N/A;  REQUESTING PORTABLE ULTARSOUND AT BEDSIDE PT'S EDC:12/02/2011   CERVICAL CERCLAGE  06/01/2011   Procedure: CERCLAGE CERVICAL;  Surgeon: Philip Aspen, DO;  Location: [facility];  Service: Gynecology;  Laterality: N/A;  REQUESTING PORTABLE ULTARSOUND AT BEDSIDE PT'S EDC:12/02/2011   endosocpy     svd     x 2   tab     x 1   TUBAL LIGATION      Current Medications: Current Meds  Medication Sig    albuterol (VENTOLIN HFA) 108 (90 Base) MCG/ACT inhaler Inhale 1-2 puffs into the lungs every 6 (six) hours as needed (cough).   aspirin EC 81 MG tablet Take 81 mg by mouth daily.   carvedilol (COREG) 25 MG tablet Take 1 tablet in the am & 1.5 tablet in the pm.   doxycycline (VIBRAMYCIN) 100 MG capsule Take 1 capsule (100 mg total) by mouth 2 (two) times daily.   ENTRESTO 49-51 MG TAKE 1 TABLET BY MOUTH TWICE DAILY   fluticasone (FLONASE) 50 MCG/ACT nasal spray Place 1 spray into both nostrils daily as needed for allergies or rhinitis.   JARDIANCE 10 MG TABS tablet Take 10 mg by mouth daily.   lidocaine (LIDODERM) 5 % Place 1 patch onto the skin as needed.   loratadine (CLARITIN) 10 MG tablet Take 10 mg by mouth daily. Takes daily   oxybutynin (DITROPAN) 5 MG tablet Take 5 mg by mouth 2 (two) times daily.   pantoprazole (PROTONIX) 40 MG tablet Take 40 mg by mouth daily.   potassium chloride 20 MEQ/15ML (10%) SOLN Take 58.5 mLs (78 mEq total) by mouth daily. Take 31ml daily (Patient taking differently: Take 80 mEq by mouth daily. Pt takes 66ml daily)   promethazine-dextromethorphan (PROMETHAZINE-DM) 6.25-15 MG/5ML syrup Take 5 mLs by mouth 3 (three) times daily as needed for cough.  spironolactone (ALDACTONE) 25 MG tablet TAKE 1 TABLET(25 MG) BY MOUTH DAILY   torsemide (DEMADEX) 20 MG tablet TAKE 2 TABLETS(40 MG) BY MOUTH TWICE DAILY     Allergies:   Penicillins   Social History   Socioeconomic History   Marital status: Single    Spouse name: Not on file   Number of children: 3   Years of education: 12   Highest education level: 12th grade  Occupational History   Not on file  Tobacco Use   Smoking status: Never   Smokeless tobacco: Never  Vaping Use   Vaping Use: Never used  Substance and Sexual Activity   Alcohol use: No   Drug use: No   Sexual activity: Yes    Birth control/protection: Surgical  Other Topics Concern   Not on file  Social History Narrative   Not on file    Social Determinants of Health   Financial Resource Strain: Not on file  Food Insecurity: Not on file  Transportation Needs: Not on file  Physical Activity: Not on file  Stress: Not on file  Social Connections: Not on file     Family History: The patient's family history includes Breast cancer (age of onset: 35) in her maternal grandmother; Diabetes in her mother; Hyperlipidemia in her mother; Hypertension in her father and mother.  ROS:   Please see the history of present illness.    All other systems reviewed and are negative.  EKGs/Labs/Other Studies Reviewed:    The following studies were reviewed today:  February 10, 2021 echo personally reviewed Left ventricular function normal 50-55% Right ventricular function normal Mitral valve normal Frequent ectopy  May 23, 2020 long-term monitor personally reviewed PVC burden about 15% 3 SVT, longest lasting 9 beats 4 NSVT, longest 5 beats  EKG:  The ekg ordered today demonstrates sinus rhythm.  Occasional PVCs, monomorphic.  PVCs have a superior axis and are positive in V1 through V2 and negative in V3 through V6.  Recent Labs: 06/05/2020: ALT 26 10/11/2020: B Natriuretic Peptide 238.9; BUN 14; Creatinine, Ser 1.09; Hemoglobin 13.5; Platelets 313; Potassium 3.5; Sodium 139  Recent Lipid Panel    Component Value Date/Time   CHOL 219 (H) 06/05/2020 0914   CHOL 168 01/14/2015 1051   CHOL 199 12/13/2012 0106   TRIG 133 06/05/2020 0914   TRIG 84 12/13/2012 0106   HDL 48 06/05/2020 0914   HDL 42 01/14/2015 1051   HDL 52 12/13/2012 0106   CHOLHDL 4.6 06/05/2020 0914   VLDL 27 06/05/2020 0914   VLDL 17 12/13/2012 0106   LDLCALC 144 (H) 06/05/2020 0914   LDLCALC 106 (H) 01/14/2015 1051   LDLCALC 130 (H) 12/13/2012 0106    Physical Exam:    VS:  BP 108/64   Pulse 61   Ht 5\' 5"  (1.651 m)   Wt 290 lb (131.5 kg)   LMP  (LMP Unknown)   SpO2 95%   BMI 48.26 kg/m     Wt Readings from Last 3 Encounters:  04/27/21 290  lb (131.5 kg)  04/11/21 288 lb 4 oz (130.7 kg)  03/29/21 291 lb (132 kg)     GEN:  Well nourished, well developed in no acute distress.  Morbidly obese HEENT: Normal NECK: No JVD; No carotid bruits LYMPHATICS: No lymphadenopathy CARDIAC: RRR, no murmurs, rubs, gallops RESPIRATORY:  Clear to auscultation without rales, wheezing or rhonchi  ABDOMEN: Soft, non-tender, non-distended MUSCULOSKELETAL:  No edema; No deformity  SKIN: Warm and dry NEUROLOGIC:  Alert  and oriented x 3 PSYCHIATRIC:  Normal affect   ASSESSMENT:    1. Frequent PVCs   2. Chronic diastolic CHF (congestive heart failure) (HCC)   3. Morbid obesity (HCC)    PLAN:    In order of problems listed above:  1. Frequent PVCs Monomorphic.  Appear to be arising from the left ventricular posterior septum/inferior wall.  Left ventricular function is relatively normal on recent echo.  She is asymptomatic with the PVCs.  She is not a candidate for catheter-based therapy given her morbid obesity.  At this time, I would recommend continued therapy for her chronic diastolic heart failure.  If her left ventricular function trends downward, would favor suppression of the PVCs with an antiarrhythmic drug.  Would trial flecainide/propafenone first.  We will plan to see her back in about 9 months with an echo.  2. Chronic diastolic CHF (congestive heart failure) (HCC) NYHA class II.  Warm and dry on exam.  On good medical therapy.  Continue close follow-up with Dr. Mariah Milling.  3. Morbid obesity (HCC) Limits invasive therapy options for her PVCs.      Medication Adjustments/Labs and Tests Ordered: Current medicines are reviewed at length with the patient today.  Concerns regarding medicines are outlined above.  Orders Placed This Encounter  Procedures   EKG 12-Lead   ECHOCARDIOGRAM COMPLETE   No orders of the defined types were placed in this encounter.    Signed, Rossie Muskrat. Lalla Brothers, MD, [facility], [facility] 04/27/2021 3:47 PM     Electrophysiology [facility]

## 2021-05-26 ENCOUNTER — Other Ambulatory Visit: Payer: Self-pay | Admitting: Cardiovascular Disease

## 2021-05-26 ENCOUNTER — Other Ambulatory Visit: Payer: Self-pay | Admitting: Family

## 2021-06-02 ENCOUNTER — Emergency Department: Payer: Managed Care, Other (non HMO)

## 2021-06-02 ENCOUNTER — Emergency Department
Admission: EM | Admit: 2021-06-02 | Discharge: 2021-06-02 | Disposition: A | Discharge status: Home / Self Care | Payer: Managed Care, Other (non HMO) | Attending: Emergency Medicine | Admitting: Emergency Medicine

## 2021-06-02 ENCOUNTER — Encounter: Payer: Self-pay | Admitting: Emergency Medicine

## 2021-06-02 ENCOUNTER — Other Ambulatory Visit: Payer: Self-pay

## 2021-06-02 DIAGNOSIS — Z7982 Long term (current) use of aspirin: Secondary | ICD-10-CM | POA: Insufficient documentation

## 2021-06-02 DIAGNOSIS — I5032 Chronic diastolic (congestive) heart failure: Secondary | ICD-10-CM | POA: Insufficient documentation

## 2021-06-02 DIAGNOSIS — R197 Diarrhea, unspecified: Secondary | ICD-10-CM | POA: Diagnosis not present

## 2021-06-02 DIAGNOSIS — Z7984 Long term (current) use of oral hypoglycemic drugs: Secondary | ICD-10-CM | POA: Diagnosis not present

## 2021-06-02 DIAGNOSIS — I110 Hypertensive heart disease with heart failure: Secondary | ICD-10-CM | POA: Diagnosis not present

## 2021-06-02 DIAGNOSIS — Z79899 Other long term (current) drug therapy: Secondary | ICD-10-CM | POA: Insufficient documentation

## 2021-06-02 DIAGNOSIS — R0789 Other chest pain: Secondary | ICD-10-CM

## 2021-06-02 DIAGNOSIS — R202 Paresthesia of skin: Secondary | ICD-10-CM | POA: Insufficient documentation

## 2021-06-02 DIAGNOSIS — K2100 Gastro-esophageal reflux disease with esophagitis, without bleeding: Secondary | ICD-10-CM | POA: Diagnosis not present

## 2021-06-02 DIAGNOSIS — R079 Chest pain, unspecified: Secondary | ICD-10-CM | POA: Diagnosis present

## 2021-06-02 DIAGNOSIS — R110 Nausea: Secondary | ICD-10-CM

## 2021-06-02 DIAGNOSIS — E1169 Type 2 diabetes mellitus with other specified complication: Secondary | ICD-10-CM | POA: Diagnosis not present

## 2021-06-02 LAB — CBC WITH DIFFERENTIAL/PLATELET
Abs Immature Granulocytes: 0.02 10*3/uL (ref 0.00–0.07)
Basophils Absolute: 0.1 10*3/uL (ref 0.0–0.1)
Basophils Relative: 1 %
Eosinophils Absolute: 0.1 10*3/uL (ref 0.0–0.5)
Eosinophils Relative: 1 %
HCT: 42.2 % (ref 36.0–46.0)
Hemoglobin: 13.8 g/dL (ref 12.0–15.0)
Immature Granulocytes: 0 %
Lymphocytes Relative: 31 %
Lymphs Abs: 2.5 10*3/uL (ref 0.7–4.0)
MCH: 29 pg (ref 26.0–34.0)
MCHC: 32.7 g/dL (ref 30.0–36.0)
MCV: 88.7 fL (ref 80.0–100.0)
Monocytes Absolute: 0.4 10*3/uL (ref 0.1–1.0)
Monocytes Relative: 5 %
Neutro Abs: 5 10*3/uL (ref 1.7–7.7)
Neutrophils Relative %: 62 %
Platelets: 343 10*3/uL (ref 150–400)
RBC: 4.76 MIL/uL (ref 3.87–5.11)
RDW: 14.3 % (ref 11.5–15.5)
WBC: 8.1 10*3/uL (ref 4.0–10.5)
nRBC: 0 % (ref 0.0–0.2)

## 2021-06-02 LAB — COMPREHENSIVE METABOLIC PANEL
ALT: 25 U/L (ref 0–44)
AST: 27 U/L (ref 15–41)
Albumin: 3.5 g/dL (ref 3.5–5.0)
Alkaline Phosphatase: 128 U/L — ABNORMAL HIGH (ref 38–126)
Anion gap: 9 (ref 5–15)
BUN: 16 mg/dL (ref 6–20)
CO2: 28 mmol/L (ref 22–32)
Calcium: 8.2 mg/dL — ABNORMAL LOW (ref 8.9–10.3)
Chloride: 101 mmol/L (ref 98–111)
Creatinine, Ser: 1.2 mg/dL — ABNORMAL HIGH (ref 0.44–1.00)
GFR, Estimated: 57 mL/min — ABNORMAL LOW (ref >60)
Glucose, Bld: 155 mg/dL — ABNORMAL HIGH (ref 70–99)
Potassium: 4 mmol/L (ref 3.5–5.1)
Sodium: 138 mmol/L (ref 135–145)
Total Bilirubin: 1.1 mg/dL (ref 0.3–1.2)
Total Protein: 7.8 g/dL (ref 6.5–8.1)

## 2021-06-02 LAB — TROPONIN I (HIGH SENSITIVITY): Troponin I (High Sensitivity): 5 ng/L (ref <18)

## 2021-06-02 LAB — BRAIN NATRIURETIC PEPTIDE: B Natriuretic Peptide: 15.7 pg/mL (ref 0.0–100.0)

## 2021-06-02 MED ORDER — ONDANSETRON 4 MG PO TBDP: 4.0000 mg | ORAL_TABLET | Freq: Three times a day (TID) | ORAL | 0 refills | Status: DC | PRN

## 2021-06-02 MED ORDER — FAMOTIDINE IN NACL 20-0.9 MG/50ML-% IV SOLN
20.0000 mg | Freq: Once | INTRAVENOUS | Status: AC
  Administered 2021-06-02: 20 mg via INTRAVENOUS
  Filled 2021-06-02: qty 50

## 2021-06-02 MED ORDER — CALCIUM GLUCONATE-NACL 1-0.675 GM/50ML-% IV SOLN
1.0000 g | Freq: Once | INTRAVENOUS | Status: AC
  Administered 2021-06-02: 1000 mg via INTRAVENOUS
  Filled 2021-06-02: qty 50

## 2021-06-02 MED ORDER — ONDANSETRON HCL 4 MG/2ML IJ SOLN
4.0000 mg | Freq: Once | INTRAMUSCULAR | Status: AC
  Administered 2021-06-02: 4 mg via INTRAVENOUS
  Filled 2021-06-02: qty 2

## 2021-06-02 NOTE — ED Provider Notes (Signed)
[facility] Emergency Department Provider Note   ____________________________________________   Event Date/Time   First MD Initiated Contact with Patient 06/02/21 0818     (approximate)  I have reviewed the triage vital signs and the nursing notes.   HISTORY  Chief Complaint Numbness and Shortness of Breath    HPI Heather Norman is a 45 y.o. female who presents for chest pain  LOCATION: Central chest DURATION: 1 day prior to arrival TIMING: Slightly improved since onset SEVERITY: Moderate QUALITY: Burning substernal pain CONTEXT: Patient states that she has had nausea and diarrhea over the past few days and woke up this morning with burning substernal chest pain MODIFYING FACTORS: Denies any exacerbating or relieving factors ASSOCIATED SYMPTOMS: Right hand numbness   Per medical record review, patient has history of heart failure and type 2 diabetes as well as morbid obesity          Past Medical History:  Diagnosis Date   (HFpEF) heart failure with preserved ejection fraction (HCC)    a. 04/2017 Echo: EF 55-60%, no rwma, Mild MR, mildly dil LA. Nl RV fxn; b. 06/2019 Echo: EF 60-65%, mod LVH. Gr2 DD. Mildly dil LA.   Arthritis    kness/hands - no meds   CHF (congestive heart failure) (HCC)    Diabetes mellitus without complication (HCC)    Dyspnea on exertion    Headache(784.0)    otc med prn   Heart murmur    Hyperlipidemia    no meds since pregnancy   Hypertension    Morbid obesity [facility])     Patient Active Problem List   Diagnosis Date Noted   Type 2 diabetes mellitus without complication, without long-term current use of insulin (HCC) 05/19/2018   DM (diabetes mellitus) (HCC) 05/11/2017   Snoring 05/11/2017   Morbid obesity (HCC) 08/30/2015   Chronic diastolic CHF (congestive heart failure) (HCC) 08/30/2015   Essential hypertension 02/16/2015   Elderly multigravida with antepartum condition or complication 05/22/2011     Past Surgical History:  Procedure Laterality Date   CERVICAL CERCLAGE  06/01/2011   Procedure: CERCLAGE CERVICAL;  Surgeon: Philip Aspen, DO;  Location: [facility];  Service: Gynecology;  Laterality: N/A;  REQUESTING PORTABLE ULTARSOUND AT BEDSIDE PT'S EDC:12/02/2011   CERVICAL CERCLAGE  06/01/2011   Procedure: CERCLAGE CERVICAL;  Surgeon: Philip Aspen, DO;  Location: [facility];  Service: Gynecology;  Laterality: N/A;  REQUESTING PORTABLE ULTARSOUND AT BEDSIDE PT'S EDC:12/02/2011   endosocpy     svd     x 2   tab     x 1   TUBAL LIGATION      Prior to Admission medications   Medication Sig Start Date End Date Taking? Authorizing Provider  albuterol (VENTOLIN HFA) 108 (90 Base) MCG/ACT inhaler Inhale 1-2 puffs into the lungs every 6 (six) hours as needed (cough). 04/20/21   BodduBelenda Cruise, FNP  aspirin EC 81 MG tablet Take 81 mg by mouth daily.    [provider]  atorvastatin (LIPITOR) 20 MG tablet Take 1 tablet (20 mg total) by mouth daily. 06/07/20 03/29/21  Alver Sorrow, NP  carvedilol (COREG) 25 MG tablet Take 1 tablet in the morning and 1.5 tablets in the evening 05/26/21   [name], MD  doxycycline (VIBRAMYCIN) 100 MG capsule Take 1 capsule (100 mg total) by mouth 2 (two) times daily. 04/20/21   Amalia Greenhouse, FNP  ENTRESTO 49-51 MG TAKE 1 TABLET BY MOUTH TWICE DAILY 01/27/21   [place],  Jarold Song, FNP  fluticasone (FLONASE) 50 MCG/ACT nasal spray Place 1 spray into both nostrils daily as needed for allergies or rhinitis.    [provider]  JARDIANCE 10 MG TABS tablet Take 10 mg by mouth daily. 10/10/19   [provider]  lidocaine (LIDODERM) 5 % Place 1 patch onto the skin as needed. 07/09/20   [provider]  loratadine (CLARITIN) 10 MG tablet Take 10 mg by mouth daily. Takes daily    [provider]  oxybutynin (DITROPAN) 5 MG tablet Take 5 mg by mouth 2 (two) times daily. 12/25/17   [provider]  pantoprazole  (PROTONIX) 40 MG tablet Take 40 mg by mouth daily. 01/27/21   [provider]  potassium chloride 20 MEQ/15ML (10%) SOLN Take 58.5 mLs (78 mEq total) by mouth daily. Take 37ml daily Patient taking differently: Take 80 mEq by mouth daily. Pt takes 49ml daily 07/09/20   [name], FNP  promethazine-dextromethorphan (PROMETHAZINE-DM) 6.25-15 MG/5ML syrup Take 5 mLs by mouth 3 (three) times daily as needed for cough. 04/20/21   Boddu, Belenda Cruise, FNP  spironolactone (ALDACTONE) 25 MG tablet TAKE 1 TABLET(25 MG) BY MOUTH DAILY 04/13/21   [name], MD  torsemide (DEMADEX) 20 MG tablet TAKE 2 TABLETS BY MOUTH TWICE DAILY 05/26/21   [name], MD    Allergies Penicillins  Family History  Problem Relation Age of Onset   Diabetes Mother    Hypertension Mother    Hyperlipidemia Mother    Hypertension Father    Breast cancer Maternal Grandmother 36    Social History Social History   Tobacco Use   Smoking status: Never   Smokeless tobacco: Never  Vaping Use   Vaping Use: Never used  Substance Use Topics   Alcohol use: No   Drug use: No    Review of Systems Constitutional: No fever/chills Eyes: No visual changes. ENT: No sore throat. Cardiovascular: Endorses chest pain. Respiratory: Denies shortness of breath. Gastrointestinal: No abdominal pain.  No nausea, no vomiting.  No diarrhea. Genitourinary: Negative for dysuria. Musculoskeletal: Negative for acute arthralgias Skin: Negative for rash. Neurological: Negative for headaches, endorses numbness and weakness in the right hand that is resolving Psychiatric: Negative for suicidal ideation/homicidal ideation   ____________________________________________   PHYSICAL EXAM:  VITAL SIGNS: ED Triage Vitals  Enc Vitals Group     BP 06/02/21 0822 (!) 148/81     Pulse Rate 06/02/21 0822 73     Resp 06/02/21 0822 20     Temp 06/02/21 0822 98 F (36.7 C)     Temp Source 06/02/21 0822 Oral     SpO2  06/02/21 0822 97 %     Weight 06/02/21 0820 290 lb (131.5 kg)     Height 06/02/21 0820 5\' 5"  (1.651 m)     Head Circumference --      Peak Flow --      Pain Score 06/02/21 0820 0     Pain Loc --      Pain Edu? --      Excl. in GC? --    Constitutional: Alert and oriented. Well appearing and in no acute distress. Eyes: Conjunctivae are normal. PERRL. Head: Atraumatic. Nose: No congestion/rhinnorhea. Mouth/Throat: Mucous membranes are moist. Neck: No stridor Cardiovascular: Grossly normal heart sounds.  Good peripheral circulation. Respiratory: Normal respiratory effort.  No retractions. Gastrointestinal: Soft and nontender. No distention. Musculoskeletal: No obvious deformities Neurologic:  Normal speech and language.  Subjective decrease sensation to  the right upper extremity.  Strength intact bilaterally Skin:  Skin is warm and dry. No rash noted. Psychiatric: Mood and affect are normal. Speech and behavior are normal.  ____________________________________________   LABS (all labs ordered are listed, but only abnormal results are displayed)  Labs Reviewed  COMPREHENSIVE METABOLIC PANEL - Abnormal; Notable for the following components:      Result Value   Glucose, Bld 155 (*)    Creatinine, Ser 1.20 (*)    Calcium 8.2 (*)    Alkaline Phosphatase 128 (*)    GFR, Estimated 57 (*)    All other components within normal limits  BRAIN NATRIURETIC PEPTIDE  CBC WITH DIFFERENTIAL/PLATELET  TROPONIN I (HIGH SENSITIVITY)   ____________________________________________  EKG  ED ECG REPORT I, Merwyn Katos, the attending physician, personally viewed and interpreted this ECG.  Date: 06/02/2021 EKG Time: 0823 Rate: 84 Rhythm: normal sinus rhythm with ventricular bigeminy QRS Axis: normal Intervals: normal ST/T Wave abnormalities: normal Narrative Interpretation: Normal sinus rhythm with ventricular bigeminy.  No evidence of acute  ischemia  ____________________________________________  RADIOLOGY  ED MD interpretation: 1 view x-ray shows pulmonary vascular congestion that appears stable since September without overt edema  CT of the head without contrast shows areas of subtle hypoattenuation within the subcortical white matter of both frontal lobes that is nonspecific but can be seen in multiple sclerosis  Official radiology report(s): DG Chest 1 View  Result Date: 06/02/2021 CLINICAL DATA:  45 year old female with shortness of breath. Upper extremity numbness. EXAM: CHEST  1 VIEW COMPARISON:  04/20/2021 and earlier. FINDINGS: Portable AP upright view at 0828 hours. Mildly lower lung volumes. Chronic pulmonary vascular congestion not significantly changed since September. No overt edema. No pneumothorax, pleural effusion or confluent pulmonary opacity. Normal cardiac size and mediastinal contours. Visualized tracheal air column is within normal limits. No acute osseous abnormality identified. IMPRESSION: Pulmonary vascular congestion appears stable since September, without overt edema. Electronically Signed   By: Odessa Fleming M.D.   On: 06/02/2021 08:35   CT Head Wo Contrast  Result Date: 06/02/2021 CLINICAL DATA:  Bilateral hand tingling with shortness of breath. EXAM: CT HEAD WITHOUT CONTRAST TECHNIQUE: Contiguous axial images were obtained from the base of the skull through the vertex without intravenous contrast. COMPARISON:  02/22/2009 FINDINGS: Brain: No hemorrhage, hydrocephalus, intra-axial, or extra-axial fluid collection. Subtle areas of hypoattenuation within the subcortical white matter of both frontal lobes, including on 18/2. Vascular: No hyperdense vessel or unexpected calcification. Skull: Normal. Sinuses/Orbits: Normal imaged portions of the orbits and globes. Clear paranasal sinuses and mastoid air cells. Other: None. IMPRESSION: Areas of subtle hypoattenuation within the subcortical white matter of both frontal  lobes. Appearance is nonspecific, and may be the sequelae of small vessel ischemic change in this patient with a history of type 2 diabetes. If multiple sclerosis is a clinical concern, consider pre and post-contrast brain MR. Electronically Signed   By: Jeronimo Greaves M.D.   On: 06/02/2021 08:49    ____________________________________________   PROCEDURES  Procedure(s) performed (including Critical Care):  .1-3 Lead EKG Interpretation Performed by: Merwyn Katos, MD Authorized by: Merwyn Katos, MD     Interpretation: normal     ECG rate:  74   ECG rate assessment: normal     Rhythm: sinus rhythm     Ectopy: PVCs     Conduction: normal     ____________________________________________   INITIAL IMPRESSION / ASSESSMENT AND PLAN / ED COURSE  As part of my  medical decision making, I reviewed the following data within the electronic medical record, if available:  Nursing notes reviewed and incorporated, Labs reviewed, EKG interpreted, Old chart reviewed, Radiograph reviewed and Notes from prior ED visits reviewed and incorporated        Workup: ECG, CXR, CBC, BMP, Troponin Findings: ECG: No overt evidence of STEMI. No evidence of Brugadas sign, delta wave, epsilon wave, significantly prolonged QTc, or malignant arrhythmia HS Troponin: Negative x1 Other Labs unremarkable for emergent problems. CXR: Without PTX, PNA, or widened mediastinum Last Stress Test:  02/10/21 Last Heart Catheterization: Unknown HEART Score: 4  Given History, Exam, and Workup I have low suspicion for ACS, Pneumothorax, Pneumonia, Pulmonary Embolus, Tamponade, Aortic Dissection or other emergent problem as a cause for this presentation.  CT results conveyed to patient and encouraged to follow-up with primary care physician and/or neurology for outpatient MRI Patient also shows signs of hypocalcemia and will replete with 1 g calcium gluconate IV  Reassesment: Prior to discharge patients pain was  controlled and they were well appearing.  Patient's bigeminy resolved to more frequent PVCs and patient's chest pain, nausea improved with IV Pepcid and Zofran  Disposition:  Discharge. Strict return precautions discussed with patient with full understanding. Advised patient to follow up promptly with primary care provider      ____________________________________________   FINAL CLINICAL IMPRESSION(S) / ED DIAGNOSES  Final diagnoses:  Atypical chest pain  Gastroesophageal reflux disease with esophagitis without hemorrhage  Nausea  Diarrhea of presumed infectious origin     ED Discharge Orders     None        Note:  This document was prepared using Dragon voice recognition software and may include unintentional dictation errors.    Merwyn Katos, MD 06/02/21 [phone]

## 2021-06-02 NOTE — ED Triage Notes (Signed)
Pt comes into the ED via POV c/o SHOB that started this morning and now she has numbness in the right hand and the start of numbness in the left hand.  Pt does admit she is a CHF patient and she has not checked her daily weights.  Pt denies noticing any swelling in her lower legs, denies any CP or dizziness.  Pt ambulatory to triage at this time and has even and unlabored respirations.

## 2021-06-09 ENCOUNTER — Other Ambulatory Visit: Payer: Self-pay | Admitting: Physician Assistant

## 2021-06-09 DIAGNOSIS — R9089 Other abnormal findings on diagnostic imaging of central nervous system: Secondary | ICD-10-CM

## 2021-06-09 DIAGNOSIS — R209 Unspecified disturbances of skin sensation: Secondary | ICD-10-CM

## 2021-06-09 DIAGNOSIS — Z820 Family history of epilepsy and other diseases of the nervous system: Secondary | ICD-10-CM

## 2021-06-09 DIAGNOSIS — R202 Paresthesia of skin: Secondary | ICD-10-CM

## 2021-06-20 DIAGNOSIS — R200 Anesthesia of skin: Secondary | ICD-10-CM | POA: Insufficient documentation

## 2021-06-20 DIAGNOSIS — G479 Sleep disorder, unspecified: Secondary | ICD-10-CM | POA: Insufficient documentation

## 2021-06-20 DIAGNOSIS — H538 Other visual disturbances: Secondary | ICD-10-CM | POA: Insufficient documentation

## 2021-06-20 DIAGNOSIS — R202 Paresthesia of skin: Secondary | ICD-10-CM | POA: Insufficient documentation

## 2021-06-21 ENCOUNTER — Other Ambulatory Visit: Payer: Self-pay | Admitting: Physician Assistant

## 2021-06-21 DIAGNOSIS — R9089 Other abnormal findings on diagnostic imaging of central nervous system: Secondary | ICD-10-CM

## 2021-06-21 DIAGNOSIS — R2689 Other abnormalities of gait and mobility: Secondary | ICD-10-CM

## 2021-06-21 DIAGNOSIS — R519 Headache, unspecified: Secondary | ICD-10-CM

## 2021-06-21 DIAGNOSIS — G479 Sleep disorder, unspecified: Secondary | ICD-10-CM

## 2021-06-21 DIAGNOSIS — R202 Paresthesia of skin: Secondary | ICD-10-CM

## 2021-06-21 DIAGNOSIS — H538 Other visual disturbances: Secondary | ICD-10-CM

## 2021-06-21 DIAGNOSIS — R200 Anesthesia of skin: Secondary | ICD-10-CM

## 2021-07-02 NOTE — Progress Notes (Signed)
Patient ID: Heather Norman, female    DOB: 1975-09-16, 45 y.o.   MRN: [id number]  HPI  Heather Norman is a 45 y/o female with a history of arthritis, DM, hyperlipidemia, HTN, morbid obesity and chronic heart failure.  Echo report from 02/10/21 Left ventricular ejection fraction, by estimation, is 50 to 55%. The left ventricle has low normal function. The left ventricle has no regional wall motion abnormalities. Left ventricular diastolic parameters are consistent with Grade I diastolic dysfunction (impaired relaxation).  Was in the ED 06/02/21 due to atypical chest pain where she was evaluated and released. Recent ED visit on 04/02/21 due to COVID 19, was evaluated and discharged home.   She presents today for a follow-up visit with a chief complaint of minimal shortness of breath upon moderate exertion. She describes this as chronic in nature having been present for several years. She has associated fatigue, dry cough, intermittent palpitations, headache, right hand tingling, chronic difficulty sleeping and gradual weight gain along with this. She denies any dizziness, abdominal distention, pedal edema, chest pain or wheezing.   Is going to be having a brain MRI done by neurology and found that her insurance did approve it. Also getting worked up for possible carpal tunnel in the right wrist since she's having right hand tingling.   In the process of moving so feels fatigued from that. Hasn't been weighing daily but does have her scales.   Past Medical History:  Diagnosis Date   (HFpEF) heart failure with preserved ejection fraction (HCC)    a. 04/2017 Echo: EF 55-60%, no rwma, Mild MR, mildly dil LA. Nl RV fxn; b. 06/2019 Echo: EF 60-65%, mod LVH. Gr2 DD. Mildly dil LA.   Arthritis    kness/hands - no meds   CHF (congestive heart failure) (HCC)    Diabetes mellitus without complication (HCC)    Dyspnea on exertion    Headache(784.0)    otc med prn   Heart murmur    Hyperlipidemia    no meds  since pregnancy   Hypertension    Morbid obesity [facility])    Past Surgical History:  Procedure Laterality Date   CERVICAL CERCLAGE  06/01/2011   Procedure: CERCLAGE CERVICAL;  Surgeon: Philip Aspen, DO;  Location: [facility];  Service: Gynecology;  Laterality: N/A;  REQUESTING PORTABLE ULTARSOUND AT BEDSIDE PT'S EDC:12/02/2011   CERVICAL CERCLAGE  06/01/2011   Procedure: CERCLAGE CERVICAL;  Surgeon: Philip Aspen, DO;  Location: [facility];  Service: Gynecology;  Laterality: N/A;  REQUESTING PORTABLE ULTARSOUND AT BEDSIDE PT'S EDC:12/02/2011   endosocpy     svd     x 2   tab     x 1   TUBAL LIGATION     Family History  Problem Relation Age of Onset   Diabetes Mother    Hypertension Mother    Hyperlipidemia Mother    Hypertension Father    Breast cancer Maternal Grandmother 24   Social History   Tobacco Use   Smoking status: Never   Smokeless tobacco: Never  Substance Use Topics   Alcohol use: No   Allergies  Allergen Reactions   Penicillins Anaphylaxis    Has patient had a PCN reaction causing immediate rash, facial/tongue/throat swelling, SOB or lightheadedness with hypotension: Yes Has patient had a PCN reaction causing severe rash involving mucus membranes or skin necrosis: No Has patient had a PCN reaction that required hospitalization: No Has patient had a PCN reaction occurring within the last 10 years: No If  all of the above answers are "NO", then may proceed with Cephalosporin use.   Prior to Admission medications   Medication Sig Start Date End Date Taking? Authorizing Provider  albuterol (VENTOLIN HFA) 108 (90 Base) MCG/ACT inhaler Inhale 1-2 puffs into the lungs every 6 (six) hours as needed (cough). 04/20/21  Yes Boddu, Belenda Cruise, FNP  aspirin EC 81 MG tablet Take 81 mg by mouth daily.   Yes [provider]  atorvastatin (LIPITOR) 20 MG tablet Take 1 tablet (20 mg total) by mouth daily. 06/07/20  Yes Alver Sorrow, NP  carvedilol (COREG) 25 MG tablet Take  1 tablet in the morning and 1.5 tablets in the evening 05/26/21  Yes Gollan, Tollie Pizza, MD  ENTRESTO 49-51 MG TAKE 1 TABLET BY MOUTH TWICE DAILY 01/27/21  Yes [name], FNP  fluticasone (FLONASE) 50 MCG/ACT nasal spray Place 1 spray into both nostrils daily as needed for allergies or rhinitis.   Yes [provider]  JARDIANCE 10 MG TABS tablet Take 10 mg by mouth daily. 10/10/19  Yes [provider]  loratadine (CLARITIN) 10 MG tablet Take 10 mg by mouth daily. Takes daily   Yes [provider]  oxybutynin (DITROPAN) 5 MG tablet Take 5 mg by mouth 2 (two) times daily. 12/25/17  Yes [provider]  pantoprazole (PROTONIX) 40 MG tablet Take 40 mg by mouth daily. 01/27/21  Yes [provider]  potassium chloride 20 MEQ/15ML (10%) SOLN Take 58.5 mLs (78 mEq total) by mouth daily. Take 64ml daily Patient taking differently: Take 80 mEq by mouth daily. Pt takes 28ml daily 07/09/20  Yes [name], FNP  spironolactone (ALDACTONE) 25 MG tablet TAKE 1 TABLET(25 MG) BY MOUTH DAILY 04/13/21  Yes [name], MD  torsemide (DEMADEX) 20 MG tablet TAKE 2 TABLETS BY MOUTH TWICE DAILY 05/26/21  Yes Gollan, Tollie Pizza, MD    Review of Systems  Constitutional:  Positive for fatigue. Negative for appetite change.  HENT:  Negative for congestion, postnasal drip and sore throat.   Eyes: Negative.   Respiratory:  Positive for cough (dry) and shortness of breath. Negative for chest tightness and wheezing.   Cardiovascular:  Positive for palpitations (at times). Negative for chest pain and leg swelling.  Gastrointestinal:  Negative for abdominal distention and abdominal pain.  Endocrine: Negative.   Genitourinary: Negative.   Musculoskeletal:  Positive for arthralgias (right arm pain at times) and joint swelling (right knee). Negative for back pain.  Skin:  Negative for wound.  Allergic/Immunologic: Negative.   Neurological:  Positive for numbness (tingling  in right hand) and headaches. Negative for dizziness, syncope and light-headedness.  Hematological:  Negative for adenopathy. Does not bruise/bleed easily.  Psychiatric/Behavioral:  Positive for sleep disturbance (not sleeping well; sleeping on 2 pillows). Negative for dysphoric mood. The patient is not nervous/anxious.    Vitals:   07/04/21 0840  BP: (!) 139/99  Pulse: 95  Resp: 20  SpO2: 97%  Weight: 294 lb 8 oz (133.6 kg)  Height: 5\' 5"  (1.651 m)   Wt Readings from Last 3 Encounters:  07/04/21 294 lb 8 oz (133.6 kg)  06/02/21 288 lb 5.8 oz (130.8 kg)  04/27/21 290 lb (131.5 kg)   Lab Results  Component Value Date   CREATININE 1.20 (H) 06/02/2021   CREATININE 1.09 (H) 10/11/2020   CREATININE 1.37 (H) 06/05/2020   Physical Exam Vitals and nursing note reviewed.  Constitutional:      Appearance: She is well-developed.  She is obese.  HENT:     Head: Normocephalic and atraumatic.  Neck:     Vascular: No JVD.  Cardiovascular:     Rate and Rhythm: Normal rate and regular rhythm.     Heart sounds: Normal heart sounds. No murmur heard.   No gallop.  Pulmonary:     Effort: Pulmonary effort is normal. No respiratory distress.     Breath sounds: Normal breath sounds. No wheezing or rales.  Abdominal:     Palpations: Abdomen is soft.     Tenderness: There is no abdominal tenderness.  Musculoskeletal:     Cervical back: Normal range of motion and neck supple.     Right lower leg: No tenderness. No edema.     Left lower leg: No tenderness. No edema.  Skin:    General: Skin is warm and dry.  Neurological:     General: No focal deficit present.     Mental Status: She is alert and oriented to person, place, and time.  Psychiatric:        Mood and Affect: Mood normal.        Behavior: Behavior normal.   Assessment & Plan:   1: Chronic heart failure with preserved ejection fraction with structural changes- - NYHA class II - euvolemic today - not weighing daily but does have  scales; reminded to call for an overnight weight gain of >2 pounds or a weekly weight gain of >5 pounds - weight up 6 pounds from last visit here 3 months ago - not adding salt to her food and is trying to follow a low sodium diet - saw cardiology Mariah Milling) 03/29/21 - has echo scheduled June 2023 - encouraged her to be as active as possible - on GDMT of carvedilol, entresto, jardiance and spironolactone.  - may need to make another sleep study referral next year as insurance has never approved the previous one - BNP 06/02/21 was 15.7  2: HTN- - BP mildly elevated but she just took her medications prior to arrival; also admits to being under stress with her health, her son's health and now she's packing and moving - sees PCP [facility])  - BMP from 06/02/21 reviewed and shows sodium 138, potassium 4.0, creatinine 1.2 and GFR 57  3: PVC's- - saw EP Lalla Brothers)  04/27/21 - has worn Zio monitor in the past which showed predominantly NSR with frequent PVC's  4: Blurred vision/ abnormal CT- - saw neurology Marlene Bast) 06/20/21 - brain/ cervical spine MRI to be done to rule out Heather   Medication bottles reviewed.   Return in 7 months or sooner for any questions/problems before then.

## 2021-07-04 ENCOUNTER — Other Ambulatory Visit: Payer: Self-pay

## 2021-07-04 ENCOUNTER — Encounter: Payer: Self-pay | Admitting: Family

## 2021-07-04 ENCOUNTER — Ambulatory Visit: Payer: Managed Care, Other (non HMO) | Attending: Family | Admitting: Family

## 2021-07-04 VITALS — BP 139/99 | HR 95 | Resp 20 | Ht 65.0 in | Wt 294.5 lb

## 2021-07-04 DIAGNOSIS — I5032 Chronic diastolic (congestive) heart failure: Secondary | ICD-10-CM

## 2021-07-04 DIAGNOSIS — I10 Essential (primary) hypertension: Secondary | ICD-10-CM | POA: Diagnosis not present

## 2021-07-04 DIAGNOSIS — E785 Hyperlipidemia, unspecified: Secondary | ICD-10-CM | POA: Diagnosis not present

## 2021-07-04 DIAGNOSIS — E119 Type 2 diabetes mellitus without complications: Secondary | ICD-10-CM | POA: Diagnosis not present

## 2021-07-04 DIAGNOSIS — Z8616 Personal history of COVID-19: Secondary | ICD-10-CM | POA: Diagnosis not present

## 2021-07-04 DIAGNOSIS — I110 Hypertensive heart disease with heart failure: Secondary | ICD-10-CM | POA: Diagnosis not present

## 2021-07-04 DIAGNOSIS — H538 Other visual disturbances: Secondary | ICD-10-CM | POA: Insufficient documentation

## 2021-07-04 DIAGNOSIS — R519 Headache, unspecified: Secondary | ICD-10-CM | POA: Diagnosis not present

## 2021-07-04 DIAGNOSIS — I493 Ventricular premature depolarization: Secondary | ICD-10-CM | POA: Diagnosis not present

## 2021-07-04 DIAGNOSIS — R5383 Other fatigue: Secondary | ICD-10-CM | POA: Diagnosis not present

## 2021-07-04 DIAGNOSIS — R059 Cough, unspecified: Secondary | ICD-10-CM | POA: Diagnosis not present

## 2021-07-04 LAB — GLUCOSE, CAPILLARY: Glucose-Capillary: 162 mg/dL — ABNORMAL HIGH (ref 70–99)

## 2021-07-04 NOTE — Patient Instructions (Signed)
Continue weighing daily and call for an overnight weight gain of 3 pounds or more or a weekly weight gain of more than 5 pounds.  °

## 2021-07-11 ENCOUNTER — Ambulatory Visit: Payer: Managed Care, Other (non HMO) | Admitting: Family

## 2021-07-12 ENCOUNTER — Other Ambulatory Visit: Payer: Self-pay | Admitting: Family

## 2021-07-12 DIAGNOSIS — I5032 Chronic diastolic (congestive) heart failure: Secondary | ICD-10-CM

## 2021-08-10 ENCOUNTER — Emergency Department: Payer: Managed Care, Other (non HMO)

## 2021-08-10 ENCOUNTER — Other Ambulatory Visit: Payer: Self-pay

## 2021-08-10 ENCOUNTER — Emergency Department
Admission: EM | Admit: 2021-08-10 | Discharge: 2021-08-10 | Disposition: A | Discharge status: Home / Self Care | Payer: Managed Care, Other (non HMO) | Attending: Emergency Medicine | Admitting: Emergency Medicine

## 2021-08-10 ENCOUNTER — Encounter: Payer: Self-pay | Admitting: Emergency Medicine

## 2021-08-10 DIAGNOSIS — R202 Paresthesia of skin: Secondary | ICD-10-CM | POA: Diagnosis not present

## 2021-08-10 DIAGNOSIS — I110 Hypertensive heart disease with heart failure: Secondary | ICD-10-CM | POA: Insufficient documentation

## 2021-08-10 DIAGNOSIS — I509 Heart failure, unspecified: Secondary | ICD-10-CM

## 2021-08-10 DIAGNOSIS — R0602 Shortness of breath: Secondary | ICD-10-CM | POA: Diagnosis not present

## 2021-08-10 LAB — BASIC METABOLIC PANEL
Anion gap: 9 (ref 5–15)
BUN: 12 mg/dL (ref 6–20)
CO2: 26 mmol/L (ref 22–32)
Calcium: 8.4 mg/dL — ABNORMAL LOW (ref 8.9–10.3)
Chloride: 104 mmol/L (ref 98–111)
Creatinine, Ser: 1.13 mg/dL — ABNORMAL HIGH (ref 0.44–1.00)
GFR, Estimated: >60 mL/min (ref >60)
Glucose, Bld: 130 mg/dL — ABNORMAL HIGH (ref 70–99)
Potassium: 3.8 mmol/L (ref 3.5–5.1)
Sodium: 139 mmol/L (ref 135–145)

## 2021-08-10 LAB — CBC
HCT: 44.6 % (ref 36.0–46.0)
Hemoglobin: 14.5 g/dL (ref 12.0–15.0)
MCH: 28.5 pg (ref 26.0–34.0)
MCHC: 32.5 g/dL (ref 30.0–36.0)
MCV: 87.8 fL (ref 80.0–100.0)
Platelets: 349 10*3/uL (ref 150–400)
RBC: 5.08 MIL/uL (ref 3.87–5.11)
RDW: 14.4 % (ref 11.5–15.5)
WBC: 7.8 10*3/uL (ref 4.0–10.5)
nRBC: 0 % (ref 0.0–0.2)

## 2021-08-10 LAB — TROPONIN I (HIGH SENSITIVITY): Troponin I (High Sensitivity): <2 ng/L (ref <18)

## 2021-08-10 LAB — BRAIN NATRIURETIC PEPTIDE: B Natriuretic Peptide: 64.6 pg/mL (ref 0.0–100.0)

## 2021-08-10 MED ORDER — FUROSEMIDE 10 MG/ML IJ SOLN
60.0000 mg | Freq: Once | INTRAMUSCULAR | Status: AC
  Administered 2021-08-10: 60 mg via INTRAVENOUS
  Filled 2021-08-10: qty 8

## 2021-08-10 NOTE — ED Triage Notes (Signed)
Pt comes into the ED via [facility] from work c/o tingling in her fingers and mild SHOB that started this morning.  Stroke screen negative with EMS.    61 HR 100% RA 146/113

## 2021-08-10 NOTE — ED Notes (Signed)
Called lab spoke to [place] for BNP add on.

## 2021-08-10 NOTE — ED Provider Notes (Signed)
[facility] Provider Note    Event Date/Time   First MD Initiated Contact with Patient 08/10/21 [phone]     (approximate)   History   Shortness of breath, tingling in the hands   HPI  Heather Norman is a 46 y.o. female with a history of congestive heart failure, high blood pressure who presents with complaints of mild shortness of breath and tingling in her hands bilaterally.  Patient reports while she was driving her kids to school today she developed tingling in her hands bilaterally, no weakness, no other neuro symptoms.  She then felt short of breath while walking into school.  She reports this is happened in the past when her CHF has been exacerbated.  She sees Dr. Mariah Milling of cardiology, reports compliance with her torsemide     Physical Exam   Triage Vital Signs: ED Triage Vitals  Enc Vitals Group     BP 08/10/21 0946 (!) 145/89     Pulse Rate 08/10/21 0946 69     Resp 08/10/21 0946 20     Temp 08/10/21 0946 98.2 F (36.8 C)     Temp Source 08/10/21 0946 Oral     SpO2 08/10/21 0946 96 %     Weight 08/10/21 0926 133.6 kg (294 lb 8.6 oz)     Height 08/10/21 0926 1.651 m (5\' 5" )     Head Circumference --      Peak Flow --      Pain Score 08/10/21 0926 3     Pain Loc --      Pain Edu? --      Excl. in GC? --     Most recent vital signs: Vitals:   08/10/21 0946  BP: (!) 145/89  Pulse: 69  Resp: 20  Temp: 98.2 F (36.8 C)  SpO2: 96%     General: Awake, no distress.  CV:  Good peripheral perfusion.  Regular rate and rhythm Resp:  Normal effort.  CTA bilaterally Abd:  No distention.  Other:  No significant lower extremity edema   ED Results / Procedures / Treatments   Labs (all labs ordered are listed, but only abnormal results are displayed) Labs Reviewed  BASIC METABOLIC PANEL - Abnormal; Notable for the following components:      Result Value   Glucose, Bld 130 (*)    Creatinine, Ser 1.13 (*)    Calcium 8.4 (*)    All  other components within normal limits  CBC  BRAIN NATRIURETIC PEPTIDE  TROPONIN I (HIGH SENSITIVITY)  TROPONIN I (HIGH SENSITIVITY)     EKG  ED ECG REPORT I, 10/08/21, the attending physician, personally viewed and interpreted this ECG.  Date: 08/10/2021  Rhythm: normal sinus rhythm QRS Axis: normal Intervals: normal ST/T Wave abnormalities: normal Narrative Interpretation: no evidence of acute ischemia    RADIOLOGY Chest x-ray reviewed by me, mild vascular congestion    PROCEDURES:  Critical Care performed:   Procedures   MEDICATIONS ORDERED IN ED: Medications  furosemide (LASIX) injection 60 mg (60 mg Intravenous Given 08/10/21 1106)     IMPRESSION / MDM / ASSESSMENT AND PLAN / ED COURSE  I reviewed the triage vital signs and the nursing notes.  Patient presents with mild shortness of breath and tingling in her hands bilaterally, no neurodeficits.  She has had this before prior to a mild CHF exacerbation.  I suspect that is what is occurring today.  Chest x-ray demonstrates vascular congestion she reports mild  shortness of breath with exertion which is new  She is compliant with her torsemide  CBC is normal, CMP is unremarkable, BNP is normal  Patient treated with IV lasix for diuresis. Discussed with on call CHF provider, they can see the patient tomorrow at 1:30 for repeat IV lasix. Considered admission however, no hypoxia, overall well appearing with reassuring tests. Therefore outpatient close f/u is appropriate.            FINAL CLINICAL IMPRESSION(S) / ED DIAGNOSES   Final diagnoses:  Acute on chronic congestive heart failure, unspecified heart failure type (HCC)     Rx / DC Orders   ED Discharge Orders     None        Note:  This document was prepared using Dragon voice recognition software and may include unintentional dictation errors.   Jene Every, MD 08/10/21 1210

## 2021-08-11 ENCOUNTER — Ambulatory Visit (HOSPITAL_BASED_OUTPATIENT_CLINIC_OR_DEPARTMENT_OTHER): Payer: Managed Care, Other (non HMO) | Admitting: Family

## 2021-08-11 ENCOUNTER — Other Ambulatory Visit
Admission: RE | Admit: 2021-08-11 | Discharge: 2021-08-11 | Disposition: A | Discharge status: Home / Self Care | Payer: Managed Care, Other (non HMO) | Source: Ambulatory Visit | Attending: Family | Admitting: Family

## 2021-08-11 ENCOUNTER — Encounter: Payer: Self-pay | Admitting: Family

## 2021-08-11 ENCOUNTER — Telehealth: Payer: Self-pay | Admitting: Family

## 2021-08-11 VITALS — BP 100/62 | HR 87 | Resp 18 | Ht 65.0 in | Wt 286.2 lb

## 2021-08-11 DIAGNOSIS — I509 Heart failure, unspecified: Secondary | ICD-10-CM | POA: Insufficient documentation

## 2021-08-11 DIAGNOSIS — I110 Hypertensive heart disease with heart failure: Secondary | ICD-10-CM | POA: Insufficient documentation

## 2021-08-11 DIAGNOSIS — I10 Essential (primary) hypertension: Secondary | ICD-10-CM | POA: Diagnosis not present

## 2021-08-11 DIAGNOSIS — H538 Other visual disturbances: Secondary | ICD-10-CM

## 2021-08-11 DIAGNOSIS — Z8616 Personal history of COVID-19: Secondary | ICD-10-CM | POA: Insufficient documentation

## 2021-08-11 DIAGNOSIS — E785 Hyperlipidemia, unspecified: Secondary | ICD-10-CM | POA: Insufficient documentation

## 2021-08-11 DIAGNOSIS — R519 Headache, unspecified: Secondary | ICD-10-CM | POA: Insufficient documentation

## 2021-08-11 DIAGNOSIS — I493 Ventricular premature depolarization: Secondary | ICD-10-CM

## 2021-08-11 DIAGNOSIS — Z79899 Other long term (current) drug therapy: Secondary | ICD-10-CM | POA: Insufficient documentation

## 2021-08-11 DIAGNOSIS — E119 Type 2 diabetes mellitus without complications: Secondary | ICD-10-CM | POA: Insufficient documentation

## 2021-08-11 DIAGNOSIS — I5032 Chronic diastolic (congestive) heart failure: Secondary | ICD-10-CM

## 2021-08-11 LAB — BASIC METABOLIC PANEL
Anion gap: 8 (ref 5–15)
BUN: 14 mg/dL (ref 6–20)
CO2: 29 mmol/L (ref 22–32)
Calcium: 8.3 mg/dL — ABNORMAL LOW (ref 8.9–10.3)
Chloride: 103 mmol/L (ref 98–111)
Creatinine, Ser: 1.27 mg/dL — ABNORMAL HIGH (ref 0.44–1.00)
GFR, Estimated: 53 mL/min — ABNORMAL LOW (ref >60)
Glucose, Bld: 110 mg/dL — ABNORMAL HIGH (ref 70–99)
Potassium: 3.1 mmol/L — ABNORMAL LOW (ref 3.5–5.1)
Sodium: 140 mmol/L (ref 135–145)

## 2021-08-11 NOTE — Patient Instructions (Addendum)
Continue weighing daily and call for an overnight weight gain of 3 pounds or more or a weekly weight gain of more than 5 pounds.     Decrease entresto to 1/2 tablet every morning and 1/2 tablet every evening

## 2021-08-11 NOTE — Progress Notes (Signed)
Patient ID: Heather Norman, female    DOB: [birth year], 46 y.o.   MRN: [id number]  HPI  [name] is a 46 y/o female with a history of arthritis, DM, hyperlipidemia, HTN, morbid obesity and chronic heart failure.  Echo report from 02/10/21 Left ventricular ejection fraction, by estimation, is 50 to 55%. The left ventricle has low normal function. The left ventricle has no regional wall motion abnormalities. Left ventricular diastolic parameters are consistent with Grade I diastolic dysfunction (impaired relaxation).  Was in the ED 08/10/21 due to shortness of breath and hand tingling. No hypoxia. Given IV lasix with symptom improvement and she was released. Was in the ED 06/02/21 due to atypical chest pain where she was evaluated and released. ED visit on 04/02/21 due to COVID 19, was evaluated and discharged home.   She presents today for a follow-up visit with a chief complaint of moderate fatigue with minimal exertion. She has associated shortness of breath, palpitations, headaches, light-headedness and difficulty sleeping along with this. She denies any cough, wheezing, chest pain, pedal edema, abdominal distention or weight gain.   Says that she really doesn't feel much different from her ED visit yesterday. Says that her neurologist is working on getting sleep study ordered and hopefully approved by her insurance.   Past Medical History:  Diagnosis Date   (HFpEF) heart failure with preserved ejection fraction ([place])    a. 04/2017 Echo: EF 55-60%, no rwma, Mild MR, mildly dil LA. Nl RV fxn; b. 06/2019 Echo: EF 60-65%, mod LVH. Gr2 DD. Mildly dil LA.   Arthritis    kness/hands - no meds   CHF (congestive heart failure) (HCC)    Diabetes mellitus without complication ([place])    Dyspnea on exertion    Headache(784.0)    otc med prn   Heart murmur    Hyperlipidemia    no meds since pregnancy   Hypertension    Morbid obesity [facility])    Past Surgical History:  Procedure Laterality Date    CERVICAL CERCLAGE  06/01/2011   Procedure: CERCLAGE CERVICAL;  Surgeon: Allyn Kenner, DO;  Location: [facility];  Service: Gynecology;  Laterality: N/A;  REQUESTING PORTABLE ULTARSOUND AT BEDSIDE PT'S EDC:12/02/2011   CERVICAL CERCLAGE  06/01/2011   Procedure: CERCLAGE CERVICAL;  Surgeon: Allyn Kenner, DO;  Location: [facility];  Service: Gynecology;  Laterality: N/A;  REQUESTING PORTABLE ULTARSOUND AT BEDSIDE PT'S EDC:12/02/2011   endosocpy     svd     x 2   tab     x 1   TUBAL LIGATION     Family History  Problem Relation Age of Onset   Diabetes Mother    Hypertension Mother    Hyperlipidemia Mother    Hypertension Father    Breast cancer Maternal Grandmother 16   Social History   Tobacco Use   Smoking status: Never   Smokeless tobacco: Never  Substance Use Topics   Alcohol use: No   Allergies  Allergen Reactions   Penicillins Anaphylaxis    Has patient had a PCN reaction causing immediate rash, facial/tongue/throat swelling, SOB or lightheadedness with hypotension: Yes Has patient had a PCN reaction causing severe rash involving mucus membranes or skin necrosis: No Has patient had a PCN reaction that required hospitalization: No Has patient had a PCN reaction occurring within the last 10 years: No If all of the above answers are "NO", then may proceed with Cephalosporin use.   Prior to Admission medications   Medication Sig Start  Date End Date Taking? Authorizing Provider  albuterol (VENTOLIN HFA) 108 (90 Base) MCG/ACT inhaler Inhale 1-2 puffs into the lungs every 6 (six) hours as needed (cough). 04/20/21  Yes Boddu, Erasmo Downer, FNP  aspirin EC 81 MG tablet Take 81 mg by mouth daily.   Yes [provider]  atorvastatin (LIPITOR) 20 MG tablet Take 1 tablet (20 mg total) by mouth daily. 06/07/20  Yes Loel Dubonnet, NP  carvedilol (COREG) 25 MG tablet Take 1 tablet in the morning and 1.5 tablets in the evening 05/26/21  Yes Gollan, Kathlene November, MD  ENTRESTO 49-51 MG TAKE  1 TABLET BY MOUTH TWICE DAILY 07/12/21  Yes Darylene Price A, FNP  fluticasone (FLONASE) 50 MCG/ACT nasal spray Place 1 spray into both nostrils daily as needed for allergies or rhinitis.   Yes [provider]  JARDIANCE 10 MG TABS tablet Take 10 mg by mouth daily. 10/10/19  Yes [provider]  loratadine (CLARITIN) 10 MG tablet Take 10 mg by mouth daily. Takes daily   Yes [provider]  oxybutynin (DITROPAN) 5 MG tablet Take 5 mg by mouth 2 (two) times daily. 12/25/17  Yes [provider]  pantoprazole (PROTONIX) 40 MG tablet Take 40 mg by mouth daily. 01/27/21  Yes [provider]  potassium chloride 20 MEQ/15ML (10%) SOLN Take 58.5 mLs (78 mEq total) by mouth daily. Take 25ml daily Patient taking differently: Take 80 mEq by mouth daily. Pt takes 26ml daily 07/09/20  Yes Darylene Price A, FNP  spironolactone (ALDACTONE) 25 MG tablet TAKE 1 TABLET(25 MG) BY MOUTH DAILY 04/13/21  Yes Minna Merritts, MD  torsemide (DEMADEX) 20 MG tablet TAKE 2 TABLETS BY MOUTH TWICE DAILY 05/26/21  Yes Gollan, Kathlene November, MD   Review of Systems  Constitutional:  Positive for fatigue. Negative for appetite change.  HENT:  Negative for congestion, postnasal drip and sore throat.   Eyes: Negative.   Respiratory:  Positive for shortness of breath. Negative for cough, chest tightness and wheezing.   Cardiovascular:  Positive for palpitations (at times). Negative for chest pain and leg swelling.  Gastrointestinal:  Negative for abdominal distention and abdominal pain.  Endocrine: Negative.   Genitourinary: Negative.   Musculoskeletal:  Positive for arthralgias (right arm pain at times) and joint swelling (right knee). Negative for back pain.  Skin:  Negative for wound.  Allergic/Immunologic: Negative.   Neurological:  Positive for light-headedness and headaches. Negative for dizziness, syncope and numbness.  Hematological:  Negative for adenopathy. Does not bruise/bleed  easily.  Psychiatric/Behavioral:  Positive for sleep disturbance (not sleeping well; sleeping on 2 pillows). Negative for dysphoric mood. The patient is not nervous/anxious.    Vitals:   08/11/21 1336 08/11/21 1355  BP: (!) 87/63 100/62  Pulse: 87   Resp: 18   SpO2: 98%   Weight: 286 lb 4 oz (129.8 kg)   Height: 5\' 5"  (1.651 m)    Wt Readings from Last 3 Encounters:  08/11/21 286 lb 4 oz (129.8 kg)  08/10/21 294 lb 8.6 oz (133.6 kg)  07/04/21 294 lb 8 oz (133.6 kg)   Lab Results  Component Value Date   CREATININE 1.27 (H) 08/11/2021   CREATININE 1.13 (H) 08/10/2021   CREATININE 1.20 (H) 06/02/2021   Physical Exam Vitals and nursing note reviewed.  Constitutional:      Appearance: She is well-developed. She is obese.  HENT:     Head: Normocephalic and atraumatic.  Neck:     Vascular: No  JVD.  Cardiovascular:     Rate and Rhythm: Normal rate and regular rhythm.     Heart sounds: Normal heart sounds. No murmur heard.   No gallop.  Pulmonary:     Effort: Pulmonary effort is normal. No respiratory distress.     Breath sounds: Normal breath sounds. No wheezing or rales.  Abdominal:     Palpations: Abdomen is soft.     Tenderness: There is no abdominal tenderness.  Musculoskeletal:     Cervical back: Normal range of motion and neck supple.     Right lower leg: No tenderness. No edema.     Left lower leg: No tenderness. No edema.  Skin:    General: Skin is warm and dry.  Neurological:     General: No focal deficit present.     Mental Status: She is alert and oriented to person, place, and time.  Psychiatric:        Mood and Affect: Mood normal.        Behavior: Behavior normal.   Assessment & Plan:   1: Chronic heart failure with preserved ejection fraction with structural changes- - NYHA class III - euvolemic today - weighing daily; reminded to call for an overnight weight gain of >2 pounds or a weekly weight gain of >5 pounds - weight down 8 pounds from last  visit here 5 weeks ago - not adding salt to her food and is trying to follow a low sodium diet - saw cardiology Rockey Situ) 03/29/21 - has echo scheduled June 2023 - encouraged her to be as active as possible - on GDMT of carvedilol, entresto, jardiance and spironolactone.  - BNP 08/10/21 was 64.6  2: HTN- - BP low (87/63) and still low on recheck (100/62) - will decrease her entresto to 24/26mg  BID by patient taking 1/2 tablet of her current dose (49/51mg ) BID - sees PCP [facility])  - BMP from 08/10/21 reviewed and shows sodium 139, potassium 3.8, creatinine 1.13 and GFR >60 - BMP rechecked today  3: PVC's- - saw EP Quentin Ore)  04/27/21 - has worn Zio monitor in the past which showed predominantly NSR with frequent PVC's  4: Blurred vision/ abnormal CT- - saw neurology Cornelia Copa) 06/20/21 - brain/ cervical spine MRI to be done to rule out MS - she says neurology is going to try and get sleep study approved by her insurance (previously has been denied)   Medication bottles reviewed.   Return in 5 days, sooner if needed.

## 2021-08-11 NOTE — Telephone Encounter (Signed)
Called patient regarding her lab results obtained today. Potassium is slightly low at 3.1 after getting IV lasix in the ED yesterday. She's currently taking daily. Advised her to take an additional today and we will recheck it at her next visit next week. Patient verbalized understanding.

## 2021-08-16 ENCOUNTER — Other Ambulatory Visit
Admission: RE | Admit: 2021-08-16 | Discharge: 2021-08-16 | Disposition: A | Discharge status: Home / Self Care | Payer: Managed Care, Other (non HMO) | Source: Ambulatory Visit | Attending: Family | Admitting: Family

## 2021-08-16 ENCOUNTER — Ambulatory Visit (HOSPITAL_BASED_OUTPATIENT_CLINIC_OR_DEPARTMENT_OTHER): Payer: Managed Care, Other (non HMO) | Admitting: Family

## 2021-08-16 ENCOUNTER — Encounter: Payer: Self-pay | Admitting: Family

## 2021-08-16 ENCOUNTER — Other Ambulatory Visit: Payer: Self-pay

## 2021-08-16 VITALS — BP 126/67 | HR 65 | Resp 20 | Ht 65.0 in | Wt 287.0 lb

## 2021-08-16 DIAGNOSIS — M1990 Unspecified osteoarthritis, unspecified site: Secondary | ICD-10-CM | POA: Insufficient documentation

## 2021-08-16 DIAGNOSIS — Z79899 Other long term (current) drug therapy: Secondary | ICD-10-CM | POA: Insufficient documentation

## 2021-08-16 DIAGNOSIS — I5032 Chronic diastolic (congestive) heart failure: Secondary | ICD-10-CM | POA: Insufficient documentation

## 2021-08-16 DIAGNOSIS — I10 Essential (primary) hypertension: Secondary | ICD-10-CM | POA: Diagnosis not present

## 2021-08-16 DIAGNOSIS — Z8249 Family history of ischemic heart disease and other diseases of the circulatory system: Secondary | ICD-10-CM | POA: Insufficient documentation

## 2021-08-16 DIAGNOSIS — I509 Heart failure, unspecified: Secondary | ICD-10-CM | POA: Insufficient documentation

## 2021-08-16 DIAGNOSIS — H538 Other visual disturbances: Secondary | ICD-10-CM | POA: Insufficient documentation

## 2021-08-16 DIAGNOSIS — G479 Sleep disorder, unspecified: Secondary | ICD-10-CM | POA: Insufficient documentation

## 2021-08-16 DIAGNOSIS — E785 Hyperlipidemia, unspecified: Secondary | ICD-10-CM | POA: Insufficient documentation

## 2021-08-16 DIAGNOSIS — I493 Ventricular premature depolarization: Secondary | ICD-10-CM

## 2021-08-16 DIAGNOSIS — I110 Hypertensive heart disease with heart failure: Secondary | ICD-10-CM | POA: Insufficient documentation

## 2021-08-16 DIAGNOSIS — E119 Type 2 diabetes mellitus without complications: Secondary | ICD-10-CM | POA: Insufficient documentation

## 2021-08-16 DIAGNOSIS — Z8616 Personal history of COVID-19: Secondary | ICD-10-CM | POA: Insufficient documentation

## 2021-08-16 DIAGNOSIS — I959 Hypotension, unspecified: Secondary | ICD-10-CM | POA: Insufficient documentation

## 2021-08-16 DIAGNOSIS — Z833 Family history of diabetes mellitus: Secondary | ICD-10-CM | POA: Insufficient documentation

## 2021-08-16 LAB — BASIC METABOLIC PANEL
Anion gap: 10 (ref 5–15)
BUN: 18 mg/dL (ref 6–20)
CO2: 26 mmol/L (ref 22–32)
Calcium: 9.4 mg/dL (ref 8.9–10.3)
Chloride: 101 mmol/L (ref 98–111)
Creatinine, Ser: 1.28 mg/dL — ABNORMAL HIGH (ref 0.44–1.00)
GFR, Estimated: 52 mL/min — ABNORMAL LOW (ref >60)
Glucose, Bld: 102 mg/dL — ABNORMAL HIGH (ref 70–99)
Potassium: 4.8 mmol/L (ref 3.5–5.1)
Sodium: 137 mmol/L (ref 135–145)

## 2021-08-16 NOTE — Progress Notes (Signed)
Patient ID: Heather Norman, female    DOB: [birth year], 46 y.o.   MRN: [id number]   Heather Norman is a 46 y/o female with a history of arthritis, DM, hyperlipidemia, HTN, morbid obesity and chronic heart failure.  Echo report from 02/10/21 Left ventricular ejection fraction, by estimation, is 50 to 55%. The left ventricle has low normal function. The left ventricle has no regional wall motion abnormalities. Left ventricular diastolic parameters are consistent with Grade I diastolic dysfunction (impaired relaxation).  Was in the ED 08/10/21 due to shortness of breath and hand tingling. No hypoxia. Given IV lasix with symptom improvement and she was released.  Was in the ED 06/02/21 due to atypical chest pain where she was evaluated and released. ED visit on 04/02/21 due to COVID 19, was evaluated and discharged home.   She presents today for a follow-up visit with a chief complaint of moderate fatigue with minimal exertion. She was in the HF clinic after her ED visit last week, where she was hypotensive, SOB with exertion, and very fatigued. During that visit, her entresto was decreased. She advised she took the decreased dose x three days and had a severe headache so she resumed her previous dose. Since resuming her medication, she reports she feels back to her baseline and felt that maybe she was diuresed too much in the ED.   She denies any CP, SOB, fatigue, palpitations, presyncope or syncope.  She weighs daily and her weight has not fluctuated more than a lb either direction.   Past Medical History:  Diagnosis Date   (HFpEF) heart failure with preserved ejection fraction ([place])    a. 04/2017 Echo: EF 55-60%, no rwma, Mild MR, mildly dil LA. Nl RV fxn; b. 06/2019 Echo: EF 60-65%, mod LVH. Gr2 DD. Mildly dil LA.   Arthritis    kness/hands - no meds   CHF (congestive heart failure) (HCC)    Diabetes mellitus without complication ([place])    Dyspnea on exertion    Headache(784.0)    otc med prn    Heart murmur    Hyperlipidemia    no meds since pregnancy   Hypertension    Morbid obesity [facility])    Past Surgical History:  Procedure Laterality Date   CERVICAL CERCLAGE  06/01/2011   Procedure: CERCLAGE CERVICAL;  Surgeon: Allyn Kenner, DO;  Location: [facility];  Service: Gynecology;  Laterality: N/A;  REQUESTING PORTABLE ULTARSOUND AT BEDSIDE PT'S EDC:12/02/2011   CERVICAL CERCLAGE  06/01/2011   Procedure: CERCLAGE CERVICAL;  Surgeon: Allyn Kenner, DO;  Location: [facility];  Service: Gynecology;  Laterality: N/A;  REQUESTING PORTABLE ULTARSOUND AT BEDSIDE PT'S EDC:12/02/2011   endosocpy     svd     x 2   tab     x 1   TUBAL LIGATION     Family History  Problem Relation Age of Onset   Diabetes Mother    Hypertension Mother    Hyperlipidemia Mother    Hypertension Father    Breast cancer Maternal Grandmother 32   Social History   Tobacco Use   Smoking status: Never   Smokeless tobacco: Never  Substance Use Topics   Alcohol use: No   Allergies  Allergen Reactions   Penicillins Anaphylaxis    Has patient had a PCN reaction causing immediate rash, facial/tongue/throat swelling, SOB or lightheadedness with hypotension: Yes Has patient had a PCN reaction causing severe rash involving mucus membranes or skin necrosis: No Has patient had a PCN reaction that  required hospitalization: No Has patient had a PCN reaction occurring within the last 10 years: No If all of the above answers are "NO", then may proceed with Cephalosporin use.   Prior to Admission medications   Medication Sig Start Date End Date Taking? Authorizing Provider  albuterol (VENTOLIN HFA) 108 (90 Base) MCG/ACT inhaler Inhale 1-2 puffs into the lungs every 6 (six) hours as needed (cough). 04/20/21  Yes Boddu, Erasmo Downer, FNP  aspirin EC 81 MG tablet Take 81 mg by mouth daily.   Yes [provider]  atorvastatin (LIPITOR) 20 MG tablet Take 1 tablet (20 mg total) by mouth daily. 06/07/20  Yes Loel Dubonnet, NP  carvedilol (COREG) 25 MG tablet Take 1 tablet in the morning and 1.5 tablets in the evening 05/26/21  Yes Gollan, Kathlene November, MD  ENTRESTO 49-51 MG TAKE 1 TABLET BY MOUTH TWICE DAILY 07/12/21  Yes Darylene Price A, FNP  fluticasone (FLONASE) 50 MCG/ACT nasal spray Place 1 spray into both nostrils daily as needed for allergies or rhinitis.   Yes [provider]  JARDIANCE 10 MG TABS tablet Take 10 mg by mouth daily. 10/10/19  Yes [provider]  loratadine (CLARITIN) 10 MG tablet Take 10 mg by mouth daily. Takes daily   Yes [provider]  oxybutynin (DITROPAN) 5 MG tablet Take 5 mg by mouth 2 (two) times daily. 12/25/17  Yes [provider]  pantoprazole (PROTONIX) 40 MG tablet Take 40 mg by mouth daily. 01/27/21  Yes [provider]  potassium chloride 20 MEQ/15ML (10%) SOLN Take 58.5 mLs (78 mEq total) by mouth daily. Take 14ml daily Patient taking differently: Take 80 mEq by mouth daily. Pt takes 69ml daily 07/09/20  Yes Darylene Price A, FNP  spironolactone (ALDACTONE) 25 MG tablet TAKE 1 TABLET(25 MG) BY MOUTH DAILY 04/13/21  Yes Minna Merritts, MD  torsemide (DEMADEX) 20 MG tablet TAKE 2 TABLETS BY MOUTH TWICE DAILY 05/26/21  Yes Minna Merritts, MD   Review of Systems  Constitutional:  Positive for fatigue (baseline). Negative for appetite change.  HENT:  Negative for congestion, postnasal drip and sore throat.   Eyes: Negative.   Respiratory:  Negative for cough, chest tightness, shortness of breath and wheezing.   Cardiovascular:  Negative for chest pain, palpitations and leg swelling.  Gastrointestinal:  Negative for abdominal distention and abdominal pain.  Endocrine: Negative.   Genitourinary: Negative.   Musculoskeletal:  Positive for arthralgias (right arm pain at times; left heel) and joint swelling (right knee). Negative for back pain.  Skin:  Negative for wound.  Allergic/Immunologic: Negative.   Neurological:  Negative for  dizziness, syncope, light-headedness, numbness and headaches.  Hematological:  Negative for adenopathy. Does not bruise/bleed easily.  Psychiatric/Behavioral:  Positive for sleep disturbance (not sleeping well; sleeping on 2 pillows). Negative for dysphoric mood. The patient is not nervous/anxious.    Vitals:   08/16/21 1140  BP: 126/67  Pulse: 65  Resp: 20  SpO2: 99%  Weight: 287 lb (130.2 kg)  Height: 5\' 5"  (1.651 m)   Wt Readings from Last 3 Encounters:  08/16/21 287 lb (130.2 kg)  08/11/21 286 lb 4 oz (129.8 kg)  08/10/21 294 lb 8.6 oz (133.6 kg)   Lab Results  Component Value Date   CREATININE 1.27 (H) 08/11/2021   CREATININE 1.13 (H) 08/10/2021   CREATININE 1.20 (H) 06/02/2021   Physical Exam Vitals and nursing note reviewed.  Constitutional:      General: She is  not in acute distress.    Appearance: She is well-developed. She is obese.  HENT:     Head: Normocephalic and atraumatic.  Neck:     Vascular: No JVD.  Cardiovascular:     Rate and Rhythm: Normal rate and regular rhythm.     Heart sounds: Normal heart sounds. No murmur heard.   No gallop.  Pulmonary:     Effort: Pulmonary effort is normal. No respiratory distress.     Breath sounds: Normal breath sounds. No wheezing or rales.  Abdominal:     Palpations: Abdomen is soft.     Tenderness: There is no abdominal tenderness.  Musculoskeletal:     Cervical back: Normal range of motion and neck supple.     Right lower leg: No tenderness. No edema.     Left lower leg: No tenderness. No edema.  Skin:    General: Skin is warm and dry.  Neurological:     General: No focal deficit present.     Mental Status: She is alert and oriented to person, place, and time.  Psychiatric:        Mood and Affect: Mood normal.        Behavior: Behavior normal.   Assessment & Plan:   1: Chronic heart failure with preserved ejection fraction with structural changes- - NYHA class II - euvolemic today - weighing daily;  reminded to call for an overnight weight gain of >2 pounds or a weekly weight gain of >5 pounds - weight up 1 lb since last visit - not adding salt to her food and is trying to follow a low sodium diet - saw cardiology Rockey Situ) 03/29/21 - has echo scheduled June 2023 - encouraged her to be as active as possible - on GDMT of carvedilol, entresto, jardiance and spironolactone.  - entresto decreased last visit due to hypotension but pt experienced headaches and she resumed previous dose - BNP 08/10/21 was 64.6  2: HTN- - BP 126/67 - entresto decreased last visit due to hypotension but pt experienced headaches and she resumed previous dose - sees PCP [facility])  - BMP from 08/11/21 reviewed and shows sodium 140, potassium 3.1, creatinine 1.27 and GFR 53 - BMP rechecked today  3: PVC's- - saw EP Quentin Ore)  04/27/21 - none noted today - has worn Zio monitor in the past which showed predominantly NSR with frequent PVC's  4: Blurred vision/ abnormal CT- - saw neurology Cornelia Copa) 06/20/21 - brain/ cervical spine MRI to be done to rule out Heather - she says neurology is going to try and get sleep study approved by her insurance (previously has been denied)   Medication bottles reviewed.   Return in 1 month, sooner if needed.

## 2021-08-16 NOTE — Patient Instructions (Signed)
The [facility] Clinic will be moving around the corner to [id number] mid-February. Our phone number will remain the same.  Return in a month for f/u.  Call the office for weight gain or worsening HF symptoms.

## 2021-08-26 ENCOUNTER — Ambulatory Visit: Payer: Self-pay

## 2021-08-29 ENCOUNTER — Other Ambulatory Visit: Payer: Self-pay

## 2021-08-29 ENCOUNTER — Ambulatory Visit
Admission: RE | Admit: 2021-08-29 | Discharge: 2021-08-29 | Disposition: A | Discharge status: Home / Self Care | Payer: Managed Care, Other (non HMO) | Source: Ambulatory Visit | Attending: Physician Assistant | Admitting: Physician Assistant

## 2021-08-29 ENCOUNTER — Ambulatory Visit (INDEPENDENT_AMBULATORY_CARE_PROVIDER_SITE_OTHER): Payer: Managed Care, Other (non HMO)

## 2021-08-29 VITALS — BP 147/45 | HR 64 | Temp 98.0°F | Resp 20

## 2021-08-29 DIAGNOSIS — J209 Acute bronchitis, unspecified: Secondary | ICD-10-CM

## 2021-08-29 DIAGNOSIS — R0602 Shortness of breath: Secondary | ICD-10-CM

## 2021-08-29 DIAGNOSIS — R059 Cough, unspecified: Secondary | ICD-10-CM | POA: Diagnosis not present

## 2021-08-29 MED ORDER — DOXYCYCLINE HYCLATE 100 MG PO CAPS: 100.0000 mg | ORAL_CAPSULE | Freq: Two times a day (BID) | ORAL | 0 refills | Status: DC

## 2021-08-29 MED ORDER — PREDNISONE 50 MG PO TABS: ORAL_TABLET | ORAL | 0 refills | Status: DC

## 2021-08-29 NOTE — ED Triage Notes (Signed)
Pt here with non-productive cough that was productive previously that started a week ago. Mild SOB. No other sx present. No fever.

## 2021-08-29 NOTE — ED Provider Notes (Signed)
Heather Norman    CSN: [id number] Arrival date & time: 08/29/21  0858      History   Chief Complaint Chief Complaint  Patient presents with   Cough    HPI Heather Norman is a 46 y.o. female.   The history is provided by the patient. No language interpreter was used.  Cough Cough characteristics:  Non-productive Sputum characteristics:  Nondescript Severity:  Moderate Onset quality:  Gradual Duration:  5 days Timing:  Constant Progression:  Worsening Chronicity:  New Relieved by:  Nothing Worsened by:  Nothing Ineffective treatments:  None tried Associated symptoms: no shortness of breath and no sinus congestion    Past Medical History:  Diagnosis Date   (HFpEF) heart failure with preserved ejection fraction ([place])    a. 04/2017 Echo: EF 55-60%, no rwma, Mild MR, mildly dil LA. Nl RV fxn; b. 06/2019 Echo: EF 60-65%, mod LVH. Gr2 DD. Mildly dil LA.   Arthritis    kness/hands - no meds   CHF (congestive heart failure) (HCC)    Diabetes mellitus without complication ([place])    Dyspnea on exertion    Headache(784.0)    otc med prn   Heart murmur    Hyperlipidemia    no meds since pregnancy   Hypertension    Morbid obesity [facility])     Patient Active Problem List   Diagnosis Date Noted   Type 2 diabetes mellitus without complication, without long-term current use of insulin ([place]) 05/19/2018   DM (diabetes mellitus) ([place]) 05/11/2017   Snoring 05/11/2017   Morbid obesity ([place]) 08/30/2015   Chronic diastolic CHF (congestive heart failure) ([place]) 08/30/2015   Essential hypertension 02/16/2015   Elderly multigravida with antepartum condition or complication [id number]    Past Surgical History:  Procedure Laterality Date   CERVICAL CERCLAGE  06/01/2011   Procedure: CERCLAGE CERVICAL;  Surgeon: Allyn Kenner, DO;  Location: [facility];  Service: Gynecology;  Laterality: N/A;  REQUESTING PORTABLE ULTARSOUND AT BEDSIDE PT'S EDC:12/02/2011   CERVICAL CERCLAGE   06/01/2011   Procedure: CERCLAGE CERVICAL;  Surgeon: Allyn Kenner, DO;  Location: [facility];  Service: Gynecology;  Laterality: N/A;  REQUESTING PORTABLE ULTARSOUND AT BEDSIDE PT'S EDC:12/02/2011   endosocpy     svd     x 2   tab     x 1   TUBAL LIGATION      OB History     Gravida  5   Para  2   Term  1   Preterm  1   AB  2   Living  2      SAB  2   IAB  0   Ectopic  0   Multiple  0   Live Births               Home Medications    Prior to Admission medications   Medication Sig Start Date End Date Taking? Authorizing Provider  albuterol (VENTOLIN HFA) 108 (90 Base) MCG/ACT inhaler Inhale 1-2 puffs into the lungs every 6 (six) hours as needed (cough). 04/20/21   BodduErasmo Downer, FNP  aspirin EC 81 MG tablet Take 81 mg by mouth daily.    [provider]  atorvastatin (LIPITOR) 20 MG tablet Take 1 tablet (20 mg total) by mouth daily. 06/07/20   Loel Dubonnet, NP  carvedilol (COREG) 25 MG tablet Take 1 tablet in the morning and 1.5 tablets in the evening 05/26/21   Minna Merritts, MD  ENTRESTO [phone]  MG TAKE 1 TABLET BY MOUTH TWICE DAILY Patient taking differently: 0.5 tablets 2 (two) times daily. 07/12/21   Alisa Graff, FNP  fluticasone (FLONASE) 50 MCG/ACT nasal spray Place 1 spray into both nostrils daily as needed for allergies or rhinitis.    [provider]  JARDIANCE 10 MG TABS tablet Take 10 mg by mouth daily. 10/10/19   [provider]  loratadine (CLARITIN) 10 MG tablet Take 10 mg by mouth daily. Takes daily    [provider]  oxybutynin (DITROPAN) 5 MG tablet Take 5 mg by mouth 2 (two) times daily. 12/25/17   [provider]  pantoprazole (PROTONIX) 40 MG tablet Take 40 mg by mouth daily. 01/27/21   [provider]  potassium chloride 20 MEQ/15ML (10%) SOLN Take 58.5 mLs (78 mEq total) by mouth daily. Take 61ml daily Patient taking differently: Take 80 mEq by mouth daily. Pt takes 49ml daily  07/09/20   Darylene Price A, FNP  spironolactone (ALDACTONE) 25 MG tablet TAKE 1 TABLET(25 MG) BY MOUTH DAILY 04/13/21   Minna Merritts, MD  torsemide (DEMADEX) 20 MG tablet TAKE 2 TABLETS BY MOUTH TWICE DAILY 05/26/21   Minna Merritts, MD    Family History Family History  Problem Relation Age of Onset   Diabetes Mother    Hypertension Mother    Hyperlipidemia Mother    Hypertension Father    Breast cancer Maternal Grandmother 61    Social History Social History   Tobacco Use   Smoking status: Never   Smokeless tobacco: Never  Vaping Use   Vaping Use: Never used  Substance Use Topics   Alcohol use: No   Drug use: No     Allergies   Penicillins   Review of Systems Review of Systems  Respiratory:  Positive for cough. Negative for shortness of breath.   All other systems reviewed and are negative.   Physical Exam Triage Vital Signs ED Triage Vitals  Enc Vitals Group     BP 08/29/21 0913 (!) 147/45     Pulse Rate 08/29/21 0913 64     Resp 08/29/21 0913 20     Temp 08/29/21 0913 98 F (36.7 C)     Temp Source 08/29/21 0913 Oral     SpO2 08/29/21 0913 (!) 18 %     Weight --      Height --      Head Circumference --      Peak Flow --      Pain Score 08/29/21 0912 0     Pain Loc --      Pain Edu? --      Excl. in [place]? --    No data found.  Updated Vital Signs BP (!) 147/45    Pulse 64    Temp 98 F (36.7 C) (Oral)    Resp 20    LMP 08/15/2021    SpO2 (!) 18%   Visual Acuity Right Eye Distance:   Left Eye Distance:   Bilateral Distance:    Right Eye Near:   Left Eye Near:    Bilateral Near:     Physical Exam Vitals and nursing note reviewed.  Constitutional:      General: She is not in acute distress.    Appearance: She is well-developed.  HENT:     Head: Normocephalic and atraumatic.  Eyes:     Conjunctiva/sclera: Conjunctivae normal.  Cardiovascular:     Rate and Rhythm: Normal rate and regular rhythm.  Heart sounds: No murmur  heard. Pulmonary:     Effort: Pulmonary effort is normal. No respiratory distress.     Breath sounds: Rhonchi present.  Abdominal:     Palpations: Abdomen is soft.     Tenderness: There is no abdominal tenderness.  Musculoskeletal:        General: No swelling.     Cervical back: Neck supple.  Skin:    General: Skin is warm and dry.     Capillary Refill: Capillary refill takes less than 2 seconds.  Neurological:     Mental Status: She is alert.  Psychiatric:        Mood and Affect: Mood normal.     UC Treatments / Results  Labs (all labs ordered are listed, but only abnormal results are displayed) Labs Reviewed - No data to display  EKG   Radiology DG Chest 2 View  Result Date: 08/29/2021 CLINICAL DATA:  Cough and shortness of breath. EXAM: CHEST - 2 VIEW COMPARISON:  08/10/2021 and 10/11/2020 FINDINGS: Again noted are prominent lung markings that appear to be chronic. No focal airspace disease or consolidation. Heart and mediastinum are within normal limits. Trachea is midline. No significant pleural fluid. Degenerative endplate changes in thoracic spine. IMPRESSION: Chronic changes without acute findings. Electronically Signed   By: Markus Daft M.D.   On: 08/29/2021 09:56    Procedures Procedures (including critical care time)  Medications Ordered in UC Medications - No data to display  Initial Impression / Assessment and Plan / UC Course  I have reviewed the triage vital signs and the nursing notes.  Pertinent labs & imaging results that were available during my care of the patient were reviewed by me and considered in my medical decision making (see chart for details).     MDM: chest xray no pneumonia   Final Clinical Impressions(s) / UC Diagnoses   Final diagnoses:  Acute bronchitis, unspecified organism   Discharge Instructions   None    ED Prescriptions     Medication Sig Dispense Auth. Provider   doxycycline (VIBRAMYCIN) 100 MG capsule Take 1 capsule  (100 mg total) by mouth 2 (two) times daily. 20 capsule ,  K, PA-C   predniSONE (DELTASONE) 50 MG tablet One tablet a day 4 tablet Fransico Meadow, [place]      PDMP not reviewed this encounter. An After Visit Summary was printed and given to the patient.    Fransico Meadow, [place] 08/29/21 1005

## 2021-09-16 ENCOUNTER — Ambulatory Visit: Payer: Managed Care, Other (non HMO) | Attending: Family | Admitting: Family

## 2021-09-16 ENCOUNTER — Encounter: Payer: Self-pay | Admitting: Family

## 2021-09-16 ENCOUNTER — Other Ambulatory Visit: Payer: Self-pay

## 2021-09-16 VITALS — BP 121/95 | HR 77 | Resp 16 | Ht 65.0 in | Wt 293.2 lb

## 2021-09-16 DIAGNOSIS — G8929 Other chronic pain: Secondary | ICD-10-CM | POA: Diagnosis not present

## 2021-09-16 DIAGNOSIS — Z7901 Long term (current) use of anticoagulants: Secondary | ICD-10-CM | POA: Insufficient documentation

## 2021-09-16 DIAGNOSIS — E785 Hyperlipidemia, unspecified: Secondary | ICD-10-CM | POA: Insufficient documentation

## 2021-09-16 DIAGNOSIS — Z7984 Long term (current) use of oral hypoglycemic drugs: Secondary | ICD-10-CM | POA: Diagnosis not present

## 2021-09-16 DIAGNOSIS — I10 Essential (primary) hypertension: Secondary | ICD-10-CM | POA: Diagnosis not present

## 2021-09-16 DIAGNOSIS — E119 Type 2 diabetes mellitus without complications: Secondary | ICD-10-CM | POA: Insufficient documentation

## 2021-09-16 DIAGNOSIS — H538 Other visual disturbances: Secondary | ICD-10-CM | POA: Diagnosis not present

## 2021-09-16 DIAGNOSIS — I493 Ventricular premature depolarization: Secondary | ICD-10-CM | POA: Insufficient documentation

## 2021-09-16 DIAGNOSIS — Z8616 Personal history of COVID-19: Secondary | ICD-10-CM | POA: Diagnosis not present

## 2021-09-16 DIAGNOSIS — I5032 Chronic diastolic (congestive) heart failure: Secondary | ICD-10-CM | POA: Diagnosis not present

## 2021-09-16 DIAGNOSIS — R42 Dizziness and giddiness: Secondary | ICD-10-CM | POA: Insufficient documentation

## 2021-09-16 DIAGNOSIS — R0602 Shortness of breath: Secondary | ICD-10-CM | POA: Insufficient documentation

## 2021-09-16 DIAGNOSIS — R5383 Other fatigue: Secondary | ICD-10-CM | POA: Insufficient documentation

## 2021-09-16 DIAGNOSIS — I110 Hypertensive heart disease with heart failure: Secondary | ICD-10-CM | POA: Diagnosis not present

## 2021-09-16 DIAGNOSIS — Z79899 Other long term (current) drug therapy: Secondary | ICD-10-CM | POA: Diagnosis not present

## 2021-09-16 DIAGNOSIS — Z09 Encounter for follow-up examination after completed treatment for conditions other than malignant neoplasm: Secondary | ICD-10-CM | POA: Insufficient documentation

## 2021-09-16 MED ORDER — TORSEMIDE 20 MG PO TABS: 40.0000 mg | ORAL_TABLET | Freq: Two times a day (BID) | ORAL | 3 refills | Status: DC

## 2021-09-16 NOTE — Progress Notes (Signed)
Patient ID: Heather Norman, female    DOB: 08-18-1975, 46 y.o.   MRN: [id number]   Heather Norman is a 46 y/o female with a history of arthritis, DM, hyperlipidemia, HTN, morbid obesity and chronic heart failure.  Echo report from 02/10/21 Left ventricular ejection fraction, by estimation, is 50 to 55%. The left ventricle has low normal function. The left ventricle has no regional wall motion abnormalities. Left ventricular diastolic parameters are consistent with Grade I diastolic dysfunction (impaired relaxation).  Was in the ED 08/10/21 due to shortness of breath and hand tingling. No hypoxia. Given IV lasix with symptom improvement and she was released. Was in the ED 06/02/21 due to atypical chest pain where she was evaluated and released. ED visit on 04/02/21 due to COVID 19, was evaluated and discharged home.   She presents today for a follow-up visit with a chief complaint of minimal fatigue upon moderate exertion. She describes this as chronic in nature. She has associated shortness of breath, dizziness, difficulty sleeping (chronic), chronic pain and slight weight gain along with this. She denies any abdominal distention, palpitations, pedal edema, chest pain or cough.   Still waiting to get sleep study approved by her insurance. Got the brain MRI approved so has to get that scheduled.   When asking about her weight/ medications, she denies any change in her diet but does mention that she was out of spironolactone for ~ 3 days as she was waiting for pharmacy to obtain medication. She just resumed it 3 days ago.   Past Medical History:  Diagnosis Date   (HFpEF) heart failure with preserved ejection fraction (HCC)    a. 04/2017 Echo: EF 55-60%, no rwma, Mild MR, mildly dil LA. Nl RV fxn; b. 06/2019 Echo: EF 60-65%, mod LVH. Gr2 DD. Mildly dil LA.   Arthritis    kness/hands - no meds   CHF (congestive heart failure) (HCC)    Diabetes mellitus without complication (HCC)    Dyspnea on exertion     Headache(784.0)    otc med prn   Heart murmur    Hyperlipidemia    no meds since pregnancy   Hypertension    Morbid obesity [facility])    Past Surgical History:  Procedure Laterality Date   CERVICAL CERCLAGE  06/01/2011   Procedure: CERCLAGE CERVICAL;  Surgeon: Philip Aspen, DO;  Location: [facility];  Service: Gynecology;  Laterality: N/A;  REQUESTING PORTABLE ULTARSOUND AT BEDSIDE PT'S EDC:12/02/2011   CERVICAL CERCLAGE  06/01/2011   Procedure: CERCLAGE CERVICAL;  Surgeon: Philip Aspen, DO;  Location: [facility];  Service: Gynecology;  Laterality: N/A;  REQUESTING PORTABLE ULTARSOUND AT BEDSIDE PT'S EDC:12/02/2011   endosocpy     svd     x 2   tab     x 1   TUBAL LIGATION     Family History  Problem Relation Age of Onset   Diabetes Mother    Hypertension Mother    Hyperlipidemia Mother    Hypertension Father    Breast cancer Maternal Grandmother 48   Social History   Tobacco Use   Smoking status: Never   Smokeless tobacco: Never  Substance Use Topics   Alcohol use: No   Allergies  Allergen Reactions   Penicillins Anaphylaxis    Has patient had a PCN reaction causing immediate rash, facial/tongue/throat swelling, SOB or lightheadedness with hypotension: Yes Has patient had a PCN reaction causing severe rash involving mucus membranes or skin necrosis: No Has patient had a PCN  reaction that required hospitalization: No Has patient had a PCN reaction occurring within the last 10 years: No If all of the above answers are "NO", then may proceed with Cephalosporin use.   Prior to Admission medications   Medication Sig Start Date End Date Taking? Authorizing Provider  albuterol (VENTOLIN HFA) 108 (90 Base) MCG/ACT inhaler Inhale 1-2 puffs into the lungs every 6 (six) hours as needed (cough). 04/20/21  Yes Boddu, Belenda Cruise, FNP  aspirin EC 81 MG tablet Take 81 mg by mouth daily.   Yes [provider]  atorvastatin (LIPITOR) 20 MG tablet Take 1 tablet (20 mg total) by mouth  daily. 06/07/20  Yes Alver Sorrow, NP  carvedilol (COREG) 25 MG tablet Take 1 tablet in the morning and 1.5 tablets in the evening 05/26/21  Yes Gollan, Tollie Pizza, MD  ENTRESTO 49-51 MG TAKE 1 TABLET BY MOUTH TWICE DAILY Patient taking differently: 1 tablet 2 (two) times daily. 07/12/21  Yes , Inetta Fermo A, FNP  fluticasone (FLONASE) 50 MCG/ACT nasal spray Place 1 spray into both nostrils daily as needed for allergies or rhinitis.   Yes [provider]  JARDIANCE 10 MG TABS tablet Take 10 mg by mouth daily. 10/10/19  Yes [provider]  loratadine (CLARITIN) 10 MG tablet Take 10 mg by mouth daily. Takes daily   Yes [provider]  oxybutynin (DITROPAN) 5 MG tablet Take 5 mg by mouth 2 (two) times daily. 12/25/17  Yes [provider]  pantoprazole (PROTONIX) 40 MG tablet Take 40 mg by mouth daily. 01/27/21  Yes [provider]  potassium chloride 20 MEQ/15ML (10%) SOLN Take 58.5 mLs (78 mEq total) by mouth daily. Take 79ml daily Patient taking differently: Take 80 mEq by mouth daily. Pt takes 76ml daily 07/09/20  Yes , Inetta Fermo A, FNP  spironolactone (ALDACTONE) 25 MG tablet TAKE 1 TABLET(25 MG) BY MOUTH DAILY 04/13/21  Yes Gollan, Tollie Pizza, MD  torsemide (DEMADEX) 20 MG tablet Take 2 tablets (40 mg total) by mouth 2 (two) times daily. 09/16/21   Delma Freeze, FNP    Review of Systems  Constitutional:  Positive for fatigue (baseline). Negative for appetite change.  HENT:  Negative for congestion, postnasal drip and sore throat.   Eyes: Negative.   Respiratory:  Positive for shortness of breath. Negative for cough, chest tightness and wheezing.   Cardiovascular:  Negative for chest pain, palpitations and leg swelling.  Gastrointestinal:  Negative for abdominal distention and abdominal pain.  Endocrine: Negative.   Genitourinary: Negative.   Musculoskeletal:  Positive for arthralgias (right arm pain at times; left heel) and joint swelling  (right knee). Negative for back pain.  Skin:  Negative for wound.  Allergic/Immunologic: Negative.   Neurological:  Positive for dizziness. Negative for syncope, light-headedness, numbness and headaches.  Hematological:  Negative for adenopathy. Does not bruise/bleed easily.  Psychiatric/Behavioral:  Positive for sleep disturbance (not sleeping well; sleeping on 2 pillows). Negative for dysphoric mood. The patient is not nervous/anxious.    Vitals:   09/16/21 1019  BP: (!) 121/95  Pulse: 77  Resp: 16  SpO2: 100%  Weight: 293 lb 4 oz (133 kg)  Height: 5\' 5"  (1.651 m)   Wt Readings from Last 3 Encounters:  09/16/21 293 lb 4 oz (133 kg)  08/16/21 287 lb (130.2 kg)  08/11/21 286 lb 4 oz (129.8 kg)   Lab Results  Component Value Date   CREATININE 1.28 (H) 08/16/2021   CREATININE 1.27 (H) 08/11/2021  CREATININE 1.13 (H) 08/10/2021   Physical Exam Vitals and nursing note reviewed.  Constitutional:      General: She is not in acute distress.    Appearance: She is well-developed. She is obese.  HENT:     Head: Normocephalic and atraumatic.  Neck:     Vascular: No JVD.  Cardiovascular:     Rate and Rhythm: Normal rate and regular rhythm.     Heart sounds: Normal heart sounds. No murmur heard.   No gallop.  Pulmonary:     Effort: Pulmonary effort is normal. No respiratory distress.     Breath sounds: Normal breath sounds. No wheezing or rales.  Abdominal:     Palpations: Abdomen is soft.     Tenderness: There is no abdominal tenderness.  Musculoskeletal:     Cervical back: Normal range of motion and neck supple.     Right lower leg: No tenderness. Edema (trace pitting) present.     Left lower leg: No tenderness. Edema (trace pitting) present.  Skin:    General: Skin is warm and dry.  Neurological:     General: No focal deficit present.     Mental Status: She is alert and oriented to person, place, and time.  Psychiatric:        Mood and Affect: Mood normal.         Behavior: Behavior normal.   Assessment & Plan:   1: Chronic heart failure with preserved ejection fraction with structural changes- - NYHA class II - euvolemic today - weighing daily; reminded to call for an overnight weight gain of >2 pounds or a weekly weight gain of >5 pounds - weight up 6 pounds since last visit here 1 month ago; had been out of spironolactone for a few days which may be the cause of the weight gain - not adding salt to her food and is trying to follow a low sodium diet - saw cardiology Mariah Milling) 03/29/21 - has echo scheduled June 2023 - encouraged her to be as active as possible - on GDMT of carvedilol, entresto, jardiance and spironolactone.  - BNP 08/10/21 was 64.6  2: HTN- - BP mildly elevated (121/95) but, again, she was out of spironolactone for 3 days and just recently resumes it - sees PCP [facility]) - BMP from 08/16/21 reviewed and showed sodium 137, potassium 4.8, creatinine 1.28 and GFR 52  3: PVC's- - saw EP Lalla Brothers)  04/27/21 - none noted today - has worn Zio monitor in the past which showed predominantly NSR with frequent PVC's  4: Blurred vision/ abnormal CT- - saw neurology Marlene Bast) 06/20/21 - brain/ cervical spine MRI to be done to rule out Heather - she says neurology is going to try and get sleep study approved by her insurance (previously has been denied)   Medication bottles reviewed.   Return in July as previously scheduled, sooner if needed.

## 2021-09-16 NOTE — Patient Instructions (Signed)
Continue weighing daily and call for an overnight weight gain of 3 pounds or more or a weekly weight gain of more than 5 pounds.  °

## 2021-10-07 ENCOUNTER — Other Ambulatory Visit: Payer: Self-pay | Admitting: Family

## 2021-11-14 ENCOUNTER — Ambulatory Visit
Admission: RE | Admit: 2021-11-14 | Discharge: 2021-11-14 | Disposition: A | Discharge status: Home / Self Care | Payer: Managed Care, Other (non HMO) | Source: Ambulatory Visit | Attending: Family Medicine | Admitting: Family Medicine

## 2021-11-14 ENCOUNTER — Other Ambulatory Visit: Payer: Self-pay | Admitting: Family Medicine

## 2021-11-14 DIAGNOSIS — M79672 Pain in left foot: Secondary | ICD-10-CM | POA: Insufficient documentation

## 2021-11-23 ENCOUNTER — Other Ambulatory Visit: Payer: Self-pay | Admitting: Cardiovascular Disease

## 2021-11-25 ENCOUNTER — Other Ambulatory Visit: Payer: Self-pay

## 2021-11-25 MED ORDER — CARVEDILOL 25 MG PO TABS: ORAL_TABLET | ORAL | 0 refills | Status: DC

## 2022-01-26 ENCOUNTER — Ambulatory Visit (INDEPENDENT_AMBULATORY_CARE_PROVIDER_SITE_OTHER): Payer: Managed Care, Other (non HMO)

## 2022-01-26 DIAGNOSIS — I493 Ventricular premature depolarization: Secondary | ICD-10-CM | POA: Diagnosis not present

## 2022-01-26 LAB — ECHOCARDIOGRAM COMPLETE
AR max vel: 2.11 cm2
AV Area VTI: 2.45 cm2
AV Area mean vel: 1.86 cm2
AV Mean grad: 5 mmHg
AV Peak grad: 8.2 mmHg
Ao pk vel: 1.43 m/s
Area-P 1/2: 3.33 cm2
Calc EF: 57.5 %
S' Lateral: 3 cm
Single Plane A2C EF: 57.8 %
Single Plane A4C EF: 57.7 %

## 2022-01-30 ENCOUNTER — Ambulatory Visit: Payer: Managed Care, Other (non HMO) | Admitting: Family

## 2022-02-04 NOTE — Progress Notes (Unsigned)
Date:  02/04/2022   ID:  Heather Norman, DOB [birth year], MRN [id number]  Patient Location:  [address] [place] [address]   Provider location:   [facility], [place] office  PCP:  Delman Cheadle, PA  Cardiologist:  [facility]  No chief complaint on file.    History of Present Illness:    Heather Norman is a 46 y.o. 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  last seen in clinic 02/2021,  More GERD symptoms recently Eating lots of fruit, PPI dose increased, lots of belching  Reviewed prior event monitor, sum total 15% burden PVCs She reports having some symptoms  Onset of PVCs appears to be within the past year Recent echocardiogram 2 months ago with mild drop in ejection fraction now down to 50 to 55% Prior echocardiogram 60 to 65%  On torsemide 40 mg twice daily Stable dose since April 2021  No regular exercise program  EKG personally reviewed by myself on todays visit Normal sinus rhythm rate 78 bpm, PVCs Long rhythm strip with PVCs in a bigeminal pattern  Other past medical history reviewed 11/2017 ER for hypertensive urgency and volume overload.  This occurred in the setting of dietary indiscretion.  Her weight was 278 pounds that day and at that time she was on Lasix 40 mg twice daily.   Given IV lasix and released   02/2018:ER visit  leg swelling, hypertensive at 175/112.   dietary indiscretions, frequently eating [employer] sandwiches for lunch Given IV lasix and released    Past Medical History:  Diagnosis Date   (HFpEF) heart failure with preserved ejection fraction (HCC)    a. 04/2017 Echo: EF 55-60%, no rwma, Mild MR, mildly dil LA. Nl RV fxn; b. 06/2019 Echo: EF 60-65%, mod LVH. Gr2 DD. Mildly dil LA.   Arthritis     kness/hands - no meds   CHF (congestive heart failure) (HCC)    Diabetes mellitus without complication (HCC)    Dyspnea on exertion    Headache(784.0)    otc med prn   Heart murmur    Hyperlipidemia    no meds since pregnancy   Hypertension    Morbid obesity [facility])    Past Surgical History:  Procedure Laterality Date   CERVICAL CERCLAGE  06/01/2011   Procedure: CERCLAGE CERVICAL;  Surgeon: Philip Aspen, DO;  Location: [facility];  Service: Gynecology;  Laterality: N/A;  REQUESTING PORTABLE ULTARSOUND AT BEDSIDE PT'S EDC:12/02/2011   CERVICAL CERCLAGE  06/01/2011   Procedure: CERCLAGE CERVICAL;  Surgeon: Philip Aspen, DO;  Location: [facility];  Service: Gynecology;  Laterality: N/A;  REQUESTING PORTABLE ULTARSOUND AT BEDSIDE PT'S EDC:12/02/2011   endosocpy     svd     x 2   tab     x 1   TUBAL LIGATION       No outpatient medications have been marked as taking for the 02/06/22 encounter (Appointment) with [name], MD.     Allergies:   Penicillins   Social History   Tobacco Use   Smoking status: Never   Smokeless tobacco: Never  Vaping Use   Vaping Use: Never used  Substance Use Topics   Alcohol use: No   Drug use: No      Family Hx:  The patient's family history includes Breast cancer (age of onset: 41) in her maternal grandmother; Diabetes in her mother; Hyperlipidemia in her mother; Hypertension in her father and mother.  ROS:   Please see the history of present illness.    Review of Systems  Constitutional: Negative.   Respiratory:  Positive for shortness of breath.   Cardiovascular: Negative.   Gastrointestinal: Negative.   Musculoskeletal: Negative.   Neurological: Negative.   Psychiatric/Behavioral: Negative.    All other systems reviewed and are negative.    Labs/Other Tests and Data Reviewed:    Recent Labs: 06/02/2021: ALT 25 08/10/2021: B Natriuretic Peptide 64.6; Hemoglobin 14.5; Platelets 349 08/16/2021: BUN 18; Creatinine, Ser 1.28;  Potassium 4.8; Sodium 137   Recent Lipid Panel Lab Results  Component Value Date/Time   CHOL 219 (H) 06/05/2020 09:14 AM   CHOL 168 01/14/2015 10:51 AM   CHOL 199 12/13/2012 01:06 AM   TRIG 133 06/05/2020 09:14 AM   TRIG 84 12/13/2012 01:06 AM   HDL 48 06/05/2020 09:14 AM   HDL 42 01/14/2015 10:51 AM   HDL 52 12/13/2012 01:06 AM   CHOLHDL 4.6 06/05/2020 09:14 AM   LDLCALC 144 (H) 06/05/2020 09:14 AM   LDLCALC 106 (H) 01/14/2015 10:51 AM   LDLCALC 130 (H) 12/13/2012 01:06 AM    Wt Readings from Last 3 Encounters:  09/16/21 293 lb 4 oz (133 kg)  08/16/21 287 lb (130.2 kg)  08/11/21 286 lb 4 oz (129.8 kg)     Exam:    Vital Signs:  LMP 08/15/2021  Constitutional:  oriented to person, place, and time. No distress.  HENT:  Head: Grossly normal Eyes:  no discharge. No scleral icterus.  Neck: No JVD, no carotid bruits  Cardiovascular: Regular rate and rhythm, no murmurs appreciated Pulmonary/Chest: Clear to auscultation bilaterally, no wheezes or rails Abdominal: Soft.  no distension.  no tenderness.  Musculoskeletal: Normal range of motion Neurological:  normal muscle tone. Coordination normal. No atrophy Skin: Skin warm and dry Psychiatric: normal affect, pleasant   ASSESSMENT & PLAN:    Chronic diastolic CHF (congestive heart failure) (HCC) Continue torsemide 40 twice daily, euvolemic Continue other medications including Entresto, spironolactone, Coreg  Frequent PVCs Noted on monitor, 15% burden Drop in EF now 50%, was 60% PVC most notable over past 1-2 years Will refer to EP for discussion concerning various treatment options  Type 2 diabetes mellitus without complication, without long-term current use of insulin (HCC) Strict diet recommended, walking program We have encouraged continued exercise, careful diet management in an effort to lose weight.  Morbid obesity (HCC) Recommended low carbohydrate diet  Essential hypertension Blood pressure elevated in  the setting of 10 pound weight gain, poor diet Diuretic changes as above should help her blood pressure  Right knee pain Likely arthritis, cortisone, better now   Total encounter time more than 25 minutes  Greater than 50% was spent in counseling and coordination of care with the patient    Signed, Julien Nordmann, MD  02/04/2022 10:40 PM    [facility] [address], [place], [place] [address]

## 2022-02-06 ENCOUNTER — Other Ambulatory Visit
Admission: RE | Admit: 2022-02-06 | Discharge: 2022-02-06 | Disposition: A | Discharge status: Home / Self Care | Payer: Managed Care, Other (non HMO) | Attending: Cardiovascular Disease | Admitting: Cardiovascular Disease

## 2022-02-06 ENCOUNTER — Ambulatory Visit: Payer: Managed Care, Other (non HMO) | Admitting: Cardiovascular Disease

## 2022-02-06 VITALS — BP 110/80 | HR 93 | Ht 65.0 in | Wt 306.0 lb

## 2022-02-06 DIAGNOSIS — R002 Palpitations: Secondary | ICD-10-CM | POA: Diagnosis not present

## 2022-02-06 DIAGNOSIS — I493 Ventricular premature depolarization: Secondary | ICD-10-CM

## 2022-02-06 DIAGNOSIS — I5032 Chronic diastolic (congestive) heart failure: Secondary | ICD-10-CM

## 2022-02-06 DIAGNOSIS — E119 Type 2 diabetes mellitus without complications: Secondary | ICD-10-CM

## 2022-02-06 DIAGNOSIS — E785 Hyperlipidemia, unspecified: Secondary | ICD-10-CM

## 2022-02-06 DIAGNOSIS — I10 Essential (primary) hypertension: Secondary | ICD-10-CM

## 2022-02-06 LAB — BASIC METABOLIC PANEL
Anion gap: 5 (ref 5–15)
BUN: 14 mg/dL (ref 6–20)
CO2: 28 mmol/L (ref 22–32)
Calcium: 8.9 mg/dL (ref 8.9–10.3)
Chloride: 106 mmol/L (ref 98–111)
Creatinine, Ser: 1.26 mg/dL — ABNORMAL HIGH (ref 0.44–1.00)
GFR, Estimated: 53 mL/min — ABNORMAL LOW (ref >60)
Glucose, Bld: 139 mg/dL — ABNORMAL HIGH (ref 70–99)
Potassium: 4 mmol/L (ref 3.5–5.1)
Sodium: 139 mmol/L (ref 135–145)

## 2022-02-06 MED ORDER — POTASSIUM CHLORIDE 20 MEQ/15ML (10%) PO SOLN: 78.0000 meq | Freq: Every day | ORAL | 5 refills | Status: DC

## 2022-02-06 NOTE — Patient Instructions (Addendum)
Medication Instructions:  Ask PMD about ozempic. Look online for ozempic coupon card.   If you need a refill on your cardiac medications before your next appointment, please call your pharmacy.   Lab work: Today: BMP   [facility] Mall Entrance at [facility] 1st desk on the right to check in (REGISTRATION)  Lab hours: Monday- Friday (7:30 am- 5:30 pm)   Testing/Procedures: No new testing needed  Follow-Up: At [facility], you and your health needs are our priority.  As part of our continuing mission to provide you with exceptional heart care, we have created designated Provider Care Teams.  These Care Teams include your primary Cardiologist (physician) and Advanced Practice Providers (APPs -  Physician Assistants and Nurse Practitioners) who all work together to provide you with the care you need, when you need it.  You will need a follow up appointment in 12 months  Providers on your designated Care Team:   Nicolasa Ducking, NP Eula Listen, PA-C Cadence Fransico Michael, [place]  COVID-19 Vaccine Information can be found at: [email or web address] For questions related to vaccine distribution or appointments, please email [email or web address] or call [phone].

## 2022-03-03 ENCOUNTER — Other Ambulatory Visit: Payer: Self-pay | Admitting: Family

## 2022-03-03 DIAGNOSIS — I5032 Chronic diastolic (congestive) heart failure: Secondary | ICD-10-CM

## 2022-03-13 NOTE — Progress Notes (Unsigned)
Patient ID: Heather Norman, female    DOB: 01-12-1976, 46 y.o.   MRN: [id number]   Ms Postma is a 46 y/o female with a history of arthritis, DM, hyperlipidemia, HTN, morbid obesity and chronic heart failure.  Echo report from 01/26/22 reviewed and showed an EF of 55-60% along with mild LVH and trivial MR. Echo report from 02/10/21 Left ventricular ejection fraction, by estimation, is 50 to 55%. The left ventricle has low normal function. The left ventricle has no regional wall motion abnormalities. Left ventricular diastolic parameters are consistent with Grade I diastolic dysfunction (impaired relaxation).  Hasn't been admitted or been in the ED in the last 6 months.   She presents today for a follow-up visit with a chief complaint of moderate fatigue with little exertion. She has associated dizziness, headache, abdominal distention, difficulty sleeping, gradual weight gain and chronic pain along with this. She denies any palpitations, pedal edema, chest pain, wheezing, shortness of breath, cough or change in appetite. She says that she wakes up feeling just as tired as when she went to bed and doesn't feel rested at all.   Currently has a boot on her left lower leg due to achilles tendonitis. Admits to not being very active due to this.   She says that she still hasn't gotten approved for a sleep study and was wondering if she could get a home sleep study done.   Not adding salt. Drinking anywhere from 120-192 ounces of fluids daily because she says that her mouth is "always dry".   Past Medical History:  Diagnosis Date   (HFpEF) heart failure with preserved ejection fraction (HCC)    a. 04/2017 Echo: EF 55-60%, no rwma, Mild MR, mildly dil LA. Nl RV fxn; b. 06/2019 Echo: EF 60-65%, mod LVH. Gr2 DD. Mildly dil LA.   Arthritis    kness/hands - no meds   CHF (congestive heart failure) (HCC)    Diabetes mellitus without complication (HCC)    Dyspnea on exertion    Headache(784.0)    otc med  prn   Heart murmur    Hyperlipidemia    no meds since pregnancy   Hypertension    Morbid obesity [facility])    Past Surgical History:  Procedure Laterality Date   CERVICAL CERCLAGE  06/01/2011   Procedure: CERCLAGE CERVICAL;  Surgeon: Philip Aspen, DO;  Location: [facility];  Service: Gynecology;  Laterality: N/A;  REQUESTING PORTABLE ULTARSOUND AT BEDSIDE PT'S EDC:12/02/2011   CERVICAL CERCLAGE  06/01/2011   Procedure: CERCLAGE CERVICAL;  Surgeon: Philip Aspen, DO;  Location: [facility];  Service: Gynecology;  Laterality: N/A;  REQUESTING PORTABLE ULTARSOUND AT BEDSIDE PT'S EDC:12/02/2011   endosocpy     svd     x 2   tab     x 1   TUBAL LIGATION     Family History  Problem Relation Age of Onset   Diabetes Mother    Hypertension Mother    Hyperlipidemia Mother    Hypertension Father    Breast cancer Maternal Grandmother 49   Social History   Tobacco Use   Smoking status: Never   Smokeless tobacco: Never  Substance Use Topics   Alcohol use: No   Allergies  Allergen Reactions   Penicillins Anaphylaxis    Has patient had a PCN reaction causing immediate rash, facial/tongue/throat swelling, SOB or lightheadedness with hypotension: Yes Has patient had a PCN reaction causing severe rash involving mucus membranes or skin necrosis: No Has patient had a  PCN reaction that required hospitalization: No Has patient had a PCN reaction occurring within the last 10 years: No If all of the above answers are "NO", then may proceed with Cephalosporin use.   Prior to Admission medications   Medication Sig Start Date End Date Taking? Authorizing Provider  albuterol (VENTOLIN HFA) 108 (90 Base) MCG/ACT inhaler Inhale 1-2 puffs into the lungs every 6 (six) hours as needed (cough). 04/20/21  Yes Boddu, Belenda Cruise, FNP  aspirin EC 81 MG tablet Take 81 mg by mouth daily.   Yes [provider]  atorvastatin (LIPITOR) 20 MG tablet TAKE 1 TABLET(20 MG) BY MOUTH DAILY 10/07/21  Yes , Inetta Fermo A,  FNP  carvedilol (COREG) 25 MG tablet TAKE 1 TABLET BY MOUTH IN THE MORNING AND 1 AND 1/2 TABLETS IN THE EVENING 11/25/21  Yes Gollan, Tollie Pizza, MD  ENTRESTO 49-51 MG TAKE 1 TABLET BY MOUTH TWICE DAILY 03/04/22  Yes [name], FNP  fluticasone (FLONASE) 50 MCG/ACT nasal spray Place 1 spray into both nostrils daily as needed for allergies or rhinitis.   Yes [provider]  JARDIANCE 10 MG TABS tablet Take 10 mg by mouth daily. 10/10/19  Yes [provider]  latanoprost (XALATAN) 0.005 % ophthalmic solution Place 1 drop into both eyes at bedtime. 01/17/22  Yes [provider]  loratadine (CLARITIN) 10 MG tablet Take 10 mg by mouth daily. Takes daily   Yes [provider]  naproxen (NAPROSYN) 500 MG tablet Take 1 tablet by mouth as needed. 11/08/21  Yes [provider]  oxybutynin (DITROPAN) 5 MG tablet Take 5 mg by mouth 2 (two) times daily. 12/25/17  Yes [provider]  OZEMPIC, 0.25 OR 0.5 MG/DOSE, 2 MG/3ML SOPN Inject 0.5 mg into the skin once a week. 02/06/22  Yes [provider]  pantoprazole (PROTONIX) 40 MG tablet Take 40 mg by mouth daily. 01/27/21  Yes [provider]  potassium chloride 20 MEQ/15ML (10%) SOLN Take 58.5 mLs (78 mEq total) by mouth daily. Take 37ml daily 02/06/22  Yes Gollan, Tollie Pizza, MD  spironolactone (ALDACTONE) 25 MG tablet TAKE 1 TABLET(25 MG) BY MOUTH DAILY 04/13/21  Yes Gollan, Tollie Pizza, MD  torsemide (DEMADEX) 20 MG tablet Take 2 tablets (40 mg total) by mouth 2 (two) times daily. 09/16/21  Yes Delma Freeze, FNP   Review of Systems  Constitutional:  Positive for fatigue (baseline). Negative for appetite change.  HENT:  Negative for congestion, postnasal drip and sore throat.   Eyes: Negative.   Respiratory:  Negative for cough, chest tightness, shortness of breath and wheezing.   Cardiovascular:  Negative for chest pain, palpitations and leg swelling.  Gastrointestinal:  Positive for  abdominal distention (feel bloated). Negative for abdominal pain.  Endocrine: Negative.   Genitourinary: Negative.   Musculoskeletal:  Positive for arthralgias (right arm pain at times; left heel/foot), back pain and neck pain.  Skin:  Negative for wound.  Allergic/Immunologic: Negative.   Neurological:  Positive for dizziness and headaches. Negative for syncope, light-headedness and numbness.  Hematological:  Negative for adenopathy. Does not bruise/bleed easily.  Psychiatric/Behavioral:  Positive for sleep disturbance (not sleeping well; sleeping on 2 pillows). Negative for dysphoric mood. The patient is not nervous/anxious.    Vitals:   03/14/22 1102  BP: (!) 141/80  Pulse: 82  Resp: 16  SpO2: 99%  Weight: (!) 308 lb 6 oz (139.9 kg)  Height: 5\' 5"  (1.651 m)   Wt Readings from Last 3 Encounters:  03/14/22 (!) 308 lb 6 oz (139.9 kg)  02/06/22 (!) 306 lb (138.8 kg)  09/16/21 293 lb 4 oz (133 kg)    Lab Results  Component Value Date   CREATININE 1.26 (H) 02/06/2022   CREATININE 1.28 (H) 08/16/2021   CREATININE 1.27 (H) 08/11/2021   Physical Exam Vitals and nursing note reviewed.  Constitutional:      General: She is not in acute distress.    Appearance: She is well-developed. She is obese.  HENT:     Head: Normocephalic and atraumatic.  Neck:     Vascular: No JVD.  Cardiovascular:     Rate and Rhythm: Normal rate and regular rhythm.     Heart sounds: Normal heart sounds. No murmur heard.    No gallop.  Pulmonary:     Effort: Pulmonary effort is normal. No respiratory distress.     Breath sounds: Normal breath sounds. No wheezing or rales.  Abdominal:     Palpations: Abdomen is soft.     Tenderness: There is no abdominal tenderness.  Musculoskeletal:     Cervical back: Normal range of motion and neck supple.     Right lower leg: No tenderness. Edema (trace pitting) present.     Comments: Has boot on left lower leg  Skin:    General: Skin is warm and dry.   Neurological:     General: No focal deficit present.     Mental Status: She is alert and oriented to person, place, and time.  Psychiatric:        Mood and Affect: Mood normal.        Behavior: Behavior normal.    Assessment & Plan:   1: Chronic heart failure with preserved ejection fraction with structural changes (mild LVH)- - NYHA class III - euvolemic today - weighing daily; reminded to call for an overnight weight gain of >2 pounds or a weekly weight gain of >5 pounds - weight up 15 pounds since last visit here 6 months ago - not as active since she's been wearing a boot on her left lower leg - not adding salt to her food and is trying to follow a low sodium diet - saw cardiology Mariah Milling) 02/06/22 - on GDMT of carvedilol, entresto, jardiance and spironolactone.  - discussed limiting her daily fluid intake to 60-64 ounces total - BNP 08/10/21 was 64.6  2: HTN- - BP mildly elevated (141/80) - sees PCP [facility]) - BMP from 02/06/22 reviewed and showed sodium 139, potassium 4.0, creatinine 1.26 and GFR 53  3: PVC's- - saw EP [facility])  04/27/21 - has worn Zio monitor in the past which showed predominantly NSR with frequent PVC's  4: Snoring- - insurance has not approved a sleep lab sleep study - will send order in for a sleep study at home and see if insurance approves this  Medication bottles reviewed.

## 2022-03-14 ENCOUNTER — Ambulatory Visit: Payer: Managed Care, Other (non HMO) | Attending: Family | Admitting: Family

## 2022-03-14 ENCOUNTER — Encounter: Payer: Self-pay | Admitting: Family

## 2022-03-14 VITALS — BP 141/80 | HR 82 | Resp 16 | Ht 65.0 in | Wt 308.4 lb

## 2022-03-14 DIAGNOSIS — I493 Ventricular premature depolarization: Secondary | ICD-10-CM | POA: Insufficient documentation

## 2022-03-14 DIAGNOSIS — I10 Essential (primary) hypertension: Secondary | ICD-10-CM

## 2022-03-14 DIAGNOSIS — I5032 Chronic diastolic (congestive) heart failure: Secondary | ICD-10-CM | POA: Diagnosis present

## 2022-03-14 DIAGNOSIS — R0683 Snoring: Secondary | ICD-10-CM

## 2022-03-14 DIAGNOSIS — E119 Type 2 diabetes mellitus without complications: Secondary | ICD-10-CM | POA: Diagnosis not present

## 2022-03-14 DIAGNOSIS — I110 Hypertensive heart disease with heart failure: Secondary | ICD-10-CM | POA: Insufficient documentation

## 2022-03-14 DIAGNOSIS — E785 Hyperlipidemia, unspecified: Secondary | ICD-10-CM | POA: Diagnosis not present

## 2022-03-14 NOTE — Patient Instructions (Addendum)
Continue weighing daily and call for an overnight weight gain of 3 pounds or more or a weekly weight gain of more than 5 pounds.   If you have voicemail, please make sure your mailbox is cleaned out so that we may leave a message and please make sure to listen to any voicemails.    Keep daily fluid intake to 60-64 ounces. Use hard candy if your mouth gets dry.

## 2022-03-23 ENCOUNTER — Other Ambulatory Visit: Payer: Self-pay | Admitting: Cardiovascular Disease

## 2022-03-23 MED ORDER — CARVEDILOL 25 MG PO TABS: ORAL_TABLET | ORAL | 3 refills | Status: DC

## 2022-04-13 ENCOUNTER — Other Ambulatory Visit: Payer: Self-pay | Admitting: Cardiovascular Disease

## 2022-06-07 ENCOUNTER — Emergency Department
Admission: EM | Admit: 2022-06-07 | Discharge: 2022-06-07 | Disposition: A | Discharge status: Home / Self Care | Payer: Managed Care, Other (non HMO) | Attending: Emergency Medicine | Admitting: Emergency Medicine

## 2022-06-07 ENCOUNTER — Other Ambulatory Visit: Payer: Self-pay

## 2022-06-07 ENCOUNTER — Emergency Department: Payer: Managed Care, Other (non HMO)

## 2022-06-07 ENCOUNTER — Encounter: Payer: Self-pay | Admitting: Emergency Medicine

## 2022-06-07 DIAGNOSIS — I110 Hypertensive heart disease with heart failure: Secondary | ICD-10-CM | POA: Insufficient documentation

## 2022-06-07 DIAGNOSIS — R0602 Shortness of breath: Secondary | ICD-10-CM | POA: Insufficient documentation

## 2022-06-07 DIAGNOSIS — I5032 Chronic diastolic (congestive) heart failure: Secondary | ICD-10-CM | POA: Diagnosis not present

## 2022-06-07 DIAGNOSIS — E119 Type 2 diabetes mellitus without complications: Secondary | ICD-10-CM | POA: Insufficient documentation

## 2022-06-07 LAB — CBC
HCT: 41.6 % (ref 36.0–46.0)
Hemoglobin: 13.4 g/dL (ref 12.0–15.0)
MCH: 28.7 pg (ref 26.0–34.0)
MCHC: 32.2 g/dL (ref 30.0–36.0)
MCV: 89.1 fL (ref 80.0–100.0)
Platelets: 303 10*3/uL (ref 150–400)
RBC: 4.67 MIL/uL (ref 3.87–5.11)
RDW: 14.6 % (ref 11.5–15.5)
WBC: 9.5 10*3/uL (ref 4.0–10.5)
nRBC: 0 % (ref 0.0–0.2)

## 2022-06-07 LAB — BASIC METABOLIC PANEL
Anion gap: 10 (ref 5–15)
BUN: 18 mg/dL (ref 6–20)
CO2: 25 mmol/L (ref 22–32)
Calcium: 8.6 mg/dL — ABNORMAL LOW (ref 8.9–10.3)
Chloride: 106 mmol/L (ref 98–111)
Creatinine, Ser: 1.41 mg/dL — ABNORMAL HIGH (ref 0.44–1.00)
GFR, Estimated: 46 mL/min — ABNORMAL LOW (ref >60)
Glucose, Bld: 162 mg/dL — ABNORMAL HIGH (ref 70–99)
Potassium: 3.7 mmol/L (ref 3.5–5.1)
Sodium: 141 mmol/L (ref 135–145)

## 2022-06-07 LAB — HEPATIC FUNCTION PANEL
ALT: 24 U/L (ref 0–44)
AST: 24 U/L (ref 15–41)
Albumin: 3.7 g/dL (ref 3.5–5.0)
Alkaline Phosphatase: 115 U/L (ref 38–126)
Bilirubin, Direct: <0.1 mg/dL (ref 0.0–0.2)
Total Bilirubin: 0.6 mg/dL (ref 0.3–1.2)
Total Protein: 7.6 g/dL (ref 6.5–8.1)

## 2022-06-07 LAB — TROPONIN I (HIGH SENSITIVITY)
Troponin I (High Sensitivity): 4 ng/L (ref <18)
Troponin I (High Sensitivity): 5 ng/L (ref <18)

## 2022-06-07 LAB — BRAIN NATRIURETIC PEPTIDE: B Natriuretic Peptide: 46.7 pg/mL (ref 0.0–100.0)

## 2022-06-07 NOTE — ED Triage Notes (Signed)
Pt states she is a heart failure patient and has been increasingly shob since coming back from lunch today.  Has not noted any swelling to extremities or weight gain recently.

## 2022-06-07 NOTE — ED Notes (Signed)
Pt to ED for SOB and dizziness since this AM. Pt has hx CHF and has been taking normal meds. Pt states SOB has resolved but still feels slightly dizzy.

## 2022-06-07 NOTE — ED Provider Notes (Signed)
[facility] Provider Note    Event Date/Time   First MD Initiated Contact with Patient 06/07/22 1751     (approximate)   History   Shortness of Breath   HPI  Heather Norman is a 46 y.o. female with past medical history of HFpEF, diabetes, hypertension hyperlipidemia who presents with shortness of breath.  Patient notes that she has felt lightheaded upon standing today.  Then after lunch she felt short of breath.  Denies associated chest pain.  Has had a dry cough but no sputum production.  No fevers or chills.  Denies any increasing lower extremity edema does feel somewhat bloated.  She takes 40 mg of torsemide twice daily.  Has been compliant.  Did eat out today at lunch but otherwise has been having normal diet.  She tried to flush out the shortness of breath but drinking a lot of water.  Also took a pill over-the-counter that it is a diuretic and thinks it helped her breathing.  Currently she no longer feels short of breath. Last EF was 12/2021 which showed EF 55 to 60% with indeterminate diastolic parameters     Past Medical History:  Diagnosis Date   (HFpEF) heart failure with preserved ejection fraction (HCC)    a. 04/2017 Echo: EF 55-60%, no rwma, Mild MR, mildly dil LA. Nl RV fxn; b. 06/2019 Echo: EF 60-65%, mod LVH. Gr2 DD. Mildly dil LA.   Arthritis    kness/hands - no meds   CHF (congestive heart failure) (HCC)    Diabetes mellitus without complication (HCC)    Dyspnea on exertion    Headache(784.0)    otc med prn   Heart murmur    Hyperlipidemia    no meds since pregnancy   Hypertension    Morbid obesity [facility])     Patient Active Problem List   Diagnosis Date Noted   Numbness and tingling 06/20/2021   Difficulty sleeping 06/20/2021   Blurred vision 06/20/2021   Type 2 diabetes mellitus without complication, without long-term current use of insulin (HCC) 05/19/2018   DM (diabetes mellitus) (HCC) 05/11/2017   Snoring 05/11/2017    Morbid obesity (HCC) 08/30/2015   Chronic diastolic CHF (congestive heart failure) (HCC) 08/30/2015   Essential hypertension 02/16/2015   Elderly multigravida with antepartum condition or complication 05/22/2011     Physical Exam  Triage Vital Signs: ED Triage Vitals  Enc Vitals Group     BP 06/07/22 1528 (!) 146/92     Pulse Rate 06/07/22 1528 85     Resp 06/07/22 1528 18     Temp 06/07/22 1528 98 F (36.7 C)     Temp Source 06/07/22 1528 Oral     SpO2 06/07/22 1528 95 %     Weight --      Height --      Head Circumference --      Peak Flow --      Pain Score 06/07/22 1529 0     Pain Loc --      Pain Edu? --      Excl. in GC? --     Most recent vital signs: Vitals:   06/07/22 1819 06/07/22 1842  BP:  107/82  Pulse:  88  Resp:  18  Temp:    SpO2: 100% 99%     General: Awake, no distress.  CV:  Good peripheral perfusion.  Pitting edema 1+ bilateral lower extremities Resp:  Normal effort.  No increased work of breathing  lungs are clear Abd:  No distention.  Neuro:             Awake, Alert, Oriented x 3  Other:     ED Results / Procedures / Treatments  Labs (all labs ordered are listed, but only abnormal results are displayed) Labs Reviewed  BASIC METABOLIC PANEL - Abnormal; Notable for the following components:      Result Value   Glucose, Bld 162 (*)    Creatinine, Ser 1.41 (*)    Calcium 8.6 (*)    GFR, Estimated 46 (*)    All other components within normal limits  CBC  BRAIN NATRIURETIC PEPTIDE  HEPATIC FUNCTION PANEL  TROPONIN I (HIGH SENSITIVITY)  TROPONIN I (HIGH SENSITIVITY)     EKG  EKG reviewed and interpreted by myself shows normal sinus rhythm with PVCs, biphasic T waves in aVF similar to prior, new T wave inversion in 1 and lead II   RADIOLOGY Chest x-ray reviewed interpreted myself shows mild vascular congestion   PROCEDURES:  Critical Care performed: No  .1-3 Lead EKG Interpretation  Performed by: Georga Hacking,  MD Authorized by: Georga Hacking, MD     Interpretation: normal     ECG rate assessment: normal     Rhythm: sinus rhythm     Conduction: normal     The patient is on the cardiac monitor to evaluate for evidence of arrhythmia and/or significant heart rate changes.   MEDICATIONS ORDERED IN ED: Medications - No data to display   IMPRESSION / MDM / ASSESSMENT AND PLAN / ED COURSE  I reviewed the triage vital signs and the nursing notes.                              Patient's presentation is most consistent with acute presentation with potential threat to life or bodily function.  Differential diagnosis includes, but is not limited to, CHF exacerbation, ACS, pulmonary embolism, pneumonia, pneumothorax  The patient is a 46 year old female with prior history of CHF with normal EF who presents with shortness of breath.  She has had some orthostatic symptoms today and then developed shortness of breath after lunchtime around 1:45.  She had no associated chest pain.  She did take an over-the-counter diuretic unclear what medication this was but felt like it made her pee and now she is feeling improved.  Denies any change in weight or new lower extremity swelling.  Has had dry cough no fevers or chills.  Patient is satting at 100% on my evaluation vitals are reassuring she is not in any respiratory distress lungs are clear does have some pitting edema in the bilateral lower extremities.  Chest x-ray shows mild vascular congestion.  Labs overall reassuring with stable creatinine negative BNP and troponin.  Patient's EKG does have some new T wave inversions in lead I and II, stable to inversion in aVF.  Given the acute onset of shortness of breath will repeat the troponin to ensure that this is nonischemic episode.  I suspect mild CHF exacerbation instructed patient to take an extra half of her torsemide this evening and to pay close attention to her weights limit her salt intake and follow-up with  Clarisa Kindred if weight is increasing where she continues to feel short of breath.    Repeat troponin is negative.  Patient is appropriate for discharge with follow-up.   FINAL CLINICAL IMPRESSION(S) / ED DIAGNOSES   Final  diagnoses:  SOB (shortness of breath)     Rx / DC Orders   ED Discharge Orders     None        Note:  This document was prepared using Dragon voice recognition software and may include unintentional dictation errors.   Georga Hacking, MD 06/07/22 1943

## 2022-06-07 NOTE — Discharge Instructions (Signed)
You may have a mild CHF exacerbation.  You can take an extra half of torsemide tonight.  Please weigh yourself daily.  If your weight is increasing or you start to feel short of breath again please call either [facility] or Dr. Windell Hummingbird office for further recommendations.

## 2022-06-08 ENCOUNTER — Telehealth: Payer: Self-pay | Admitting: Family

## 2022-06-08 NOTE — Telephone Encounter (Signed)
LVM with patient in attempt to schedule a hospital follow up appointment.   , NT

## 2022-06-09 ENCOUNTER — Encounter: Payer: Self-pay | Admitting: Family

## 2022-06-09 ENCOUNTER — Ambulatory Visit (HOSPITAL_BASED_OUTPATIENT_CLINIC_OR_DEPARTMENT_OTHER): Payer: Managed Care, Other (non HMO) | Admitting: Family

## 2022-06-09 ENCOUNTER — Other Ambulatory Visit
Admission: RE | Admit: 2022-06-09 | Discharge: 2022-06-09 | Disposition: A | Discharge status: Home / Self Care | Payer: Managed Care, Other (non HMO) | Source: Ambulatory Visit | Attending: Family | Admitting: Family

## 2022-06-09 ENCOUNTER — Other Ambulatory Visit: Payer: Self-pay | Admitting: Family

## 2022-06-09 VITALS — BP 119/57 | HR 56 | Resp 18 | Ht 65.0 in | Wt 298.0 lb

## 2022-06-09 DIAGNOSIS — E119 Type 2 diabetes mellitus without complications: Secondary | ICD-10-CM | POA: Insufficient documentation

## 2022-06-09 DIAGNOSIS — Z7951 Long term (current) use of inhaled steroids: Secondary | ICD-10-CM | POA: Insufficient documentation

## 2022-06-09 DIAGNOSIS — R600 Localized edema: Secondary | ICD-10-CM | POA: Insufficient documentation

## 2022-06-09 DIAGNOSIS — R0602 Shortness of breath: Secondary | ICD-10-CM | POA: Insufficient documentation

## 2022-06-09 DIAGNOSIS — I10 Essential (primary) hypertension: Secondary | ICD-10-CM | POA: Diagnosis not present

## 2022-06-09 DIAGNOSIS — I493 Ventricular premature depolarization: Secondary | ICD-10-CM

## 2022-06-09 DIAGNOSIS — R0683 Snoring: Secondary | ICD-10-CM | POA: Insufficient documentation

## 2022-06-09 DIAGNOSIS — Z7984 Long term (current) use of oral hypoglycemic drugs: Secondary | ICD-10-CM | POA: Insufficient documentation

## 2022-06-09 DIAGNOSIS — Z7902 Long term (current) use of antithrombotics/antiplatelets: Secondary | ICD-10-CM | POA: Insufficient documentation

## 2022-06-09 DIAGNOSIS — E785 Hyperlipidemia, unspecified: Secondary | ICD-10-CM | POA: Insufficient documentation

## 2022-06-09 DIAGNOSIS — Z7982 Long term (current) use of aspirin: Secondary | ICD-10-CM | POA: Insufficient documentation

## 2022-06-09 DIAGNOSIS — I5033 Acute on chronic diastolic (congestive) heart failure: Secondary | ICD-10-CM

## 2022-06-09 DIAGNOSIS — I110 Hypertensive heart disease with heart failure: Secondary | ICD-10-CM | POA: Insufficient documentation

## 2022-06-09 DIAGNOSIS — Z6841 Body Mass Index (BMI) 40.0 and over, adult: Secondary | ICD-10-CM | POA: Insufficient documentation

## 2022-06-09 DIAGNOSIS — I5032 Chronic diastolic (congestive) heart failure: Secondary | ICD-10-CM | POA: Diagnosis present

## 2022-06-09 DIAGNOSIS — Z79899 Other long term (current) drug therapy: Secondary | ICD-10-CM | POA: Insufficient documentation

## 2022-06-09 LAB — BASIC METABOLIC PANEL
Anion gap: 7 (ref 5–15)
BUN: 18 mg/dL (ref 6–20)
CO2: 28 mmol/L (ref 22–32)
Calcium: 8.8 mg/dL — ABNORMAL LOW (ref 8.9–10.3)
Chloride: 107 mmol/L (ref 98–111)
Creatinine, Ser: 1.35 mg/dL — ABNORMAL HIGH (ref 0.44–1.00)
GFR, Estimated: 49 mL/min — ABNORMAL LOW (ref >60)
Glucose, Bld: 120 mg/dL — ABNORMAL HIGH (ref 70–99)
Potassium: 3.7 mmol/L (ref 3.5–5.1)
Sodium: 142 mmol/L (ref 135–145)

## 2022-06-09 MED ORDER — METOLAZONE 2.5 MG PO TABS: 2.5000 mg | ORAL_TABLET | Freq: Every day | ORAL | 0 refills | Status: DC

## 2022-06-09 NOTE — Progress Notes (Signed)
Patient ID: Heather Norman, female    DOB: 07-May-1976, 46 y.o.   MRN: [id number]   Ms Harlan is a 46 y/o female with a history of arthritis, DM, hyperlipidemia, HTN, morbid obesity and chronic heart failure.  Echo report from 01/26/22 reviewed and showed an EF of 55-60% along with mild LVH and trivial MR. Echo report from 02/10/21 Left ventricular ejection fraction, by estimation, is 50 to 55%. The left ventricle has low normal function. The left ventricle has no regional wall motion abnormalities. Left ventricular diastolic parameters are consistent with Grade I diastolic dysfunction (impaired relaxation).  Was in the ED 06/07/22 due to SOB and abdominal bloating. Oral diuretic increased and she was released.   She presents today for an urgent visit with a chief complaint of moderate fatigue with minimal exertion. Describes this as chronic in nature. She has associated shortness of breath, abdominal distention (bloating), dizziness, headaches, chronic pain and chronic difficulty sleeping along with this. She denies any palpitations, pedal edema, chest pain, wheezing, cough or weight gain.   Not adding salt. Drinking anywhere from 120-192 ounces of fluids daily because she says that her mouth is "always dry".   Past Medical History:  Diagnosis Date   (HFpEF) heart failure with preserved ejection fraction (HCC)    a. 04/2017 Echo: EF 55-60%, no rwma, Mild MR, mildly dil LA. Nl RV fxn; b. 06/2019 Echo: EF 60-65%, mod LVH. Gr2 DD. Mildly dil LA.   Arthritis    kness/hands - no meds   CHF (congestive heart failure) (HCC)    Diabetes mellitus without complication (HCC)    Dyspnea on exertion    Headache(784.0)    otc med prn   Heart murmur    Hyperlipidemia    no meds since pregnancy   Hypertension    Morbid obesity [facility])    Past Surgical History:  Procedure Laterality Date   CERVICAL CERCLAGE  06/01/2011   Procedure: CERCLAGE CERVICAL;  Surgeon: Philip Aspen, DO;  Location: [facility];   Service: Gynecology;  Laterality: N/A;  REQUESTING PORTABLE ULTARSOUND AT BEDSIDE PT'S EDC:12/02/2011   CERVICAL CERCLAGE  06/01/2011   Procedure: CERCLAGE CERVICAL;  Surgeon: Philip Aspen, DO;  Location: [facility];  Service: Gynecology;  Laterality: N/A;  REQUESTING PORTABLE ULTARSOUND AT BEDSIDE PT'S EDC:12/02/2011   endosocpy     svd     x 2   tab     x 1   TUBAL LIGATION     Family History  Problem Relation Age of Onset   Diabetes Mother    Hypertension Mother    Hyperlipidemia Mother    Hypertension Father    Breast cancer Maternal Grandmother 8   Social History   Tobacco Use   Smoking status: Never   Smokeless tobacco: Never  Substance Use Topics   Alcohol use: No   Allergies  Allergen Reactions   Penicillins Anaphylaxis    Has patient had a PCN reaction causing immediate rash, facial/tongue/throat swelling, SOB or lightheadedness with hypotension: Yes Has patient had a PCN reaction causing severe rash involving mucus membranes or skin necrosis: No Has patient had a PCN reaction that required hospitalization: No Has patient had a PCN reaction occurring within the last 10 years: No If all of the above answers are "NO", then may proceed with Cephalosporin use.   Prior to Admission medications   Medication Sig Start Date End Date Taking? Authorizing Provider  albuterol (VENTOLIN HFA) 108 (90 Base) MCG/ACT inhaler Inhale 1-2 puffs into  the lungs every 6 (six) hours as needed (cough). 04/20/21  Yes Boddu, Belenda Cruise, FNP  aspirin EC 81 MG tablet Take 81 mg by mouth daily.   Yes [provider]  atorvastatin (LIPITOR) 20 MG tablet TAKE 1 TABLET(20 MG) BY MOUTH DAILY 10/07/21  Yes , Inetta Fermo A, FNP  carvedilol (COREG) 25 MG tablet TAKE 1 TABLET BY MOUTH IN THE MORNING AND 1 AND 1/2 TABLETS IN THE EVENING 03/23/22  Yes Gollan, Tollie Pizza, MD  ENTRESTO 49-51 MG TAKE 1 TABLET BY MOUTH TWICE DAILY 03/04/22  Yes ,  A, FNP  JARDIANCE 10 MG TABS tablet Take 10 mg by  mouth daily. 10/10/19  Yes [provider]  latanoprost (XALATAN) 0.005 % ophthalmic solution Place 1 drop into both eyes at bedtime. 01/17/22  Yes [provider]  loratadine (CLARITIN) 10 MG tablet Take 10 mg by mouth daily. Takes daily   Yes [provider]  oxybutynin (DITROPAN) 5 MG tablet Take 5 mg by mouth 2 (two) times daily. 12/25/17  Yes [provider]  OZEMPIC, 0.25 OR 0.5 MG/DOSE, 2 MG/3ML SOPN Inject 0.5 mg into the skin once a week. 02/06/22  Yes [provider]  pantoprazole (PROTONIX) 40 MG tablet Take 40 mg by mouth daily. 01/27/21  Yes [provider]  potassium chloride 20 MEQ/15ML (10%) SOLN Take 58.5 mLs (78 mEq total) by mouth daily. Take 64ml daily 02/06/22  Yes Gollan, Tollie Pizza, MD  spironolactone (ALDACTONE) 25 MG tablet TAKE 1 TABLET(25 MG) BY MOUTH DAILY 04/13/22  Yes Gollan, Tollie Pizza, MD  torsemide (DEMADEX) 20 MG tablet Take 2 tablets (40 mg total) by mouth 2 (two) times daily. 09/16/21  Yes , Inetta Fermo A, FNP  fluticasone (FLONASE) 50 MCG/ACT nasal spray Place 1 spray into both nostrils daily as needed for allergies or rhinitis. Patient not taking: Reported on 06/09/2022    [provider]  naproxen (NAPROSYN) 500 MG tablet Take 1 tablet by mouth as needed. Patient not taking: Reported on 06/09/2022 11/08/21   [provider]    Review of Systems  Constitutional:  Positive for fatigue (baseline). Negative for appetite change.  HENT:  Negative for congestion, postnasal drip and sore throat.   Eyes: Negative.   Respiratory:  Positive for shortness of breath. Negative for cough, chest tightness and wheezing.   Cardiovascular:  Negative for chest pain, palpitations and leg swelling.  Gastrointestinal:  Positive for abdominal distention (feel bloated). Negative for abdominal pain.  Endocrine: Negative.   Genitourinary: Negative.   Musculoskeletal:  Positive for arthralgias (right arm pain at times;  left heel/foot), back pain and neck pain.  Skin:  Negative for wound.  Allergic/Immunologic: Negative.   Neurological:  Positive for dizziness and headaches. Negative for syncope, light-headedness and numbness.  Hematological:  Negative for adenopathy. Does not bruise/bleed easily.  Psychiatric/Behavioral:  Positive for sleep disturbance (not sleeping well; sleeping on 2 pillows). Negative for dysphoric mood. The patient is not nervous/anxious.    Vitals:   06/09/22 1210  BP: (!) 119/57  Pulse: (!) 56  Resp: 18  SpO2: 98%  Weight: 298 lb (135.2 kg)  Height: 5\' 5"  (1.651 m)   Wt Readings from Last 3 Encounters:  06/09/22 298 lb (135.2 kg)  03/14/22 (!) 308 lb 6 oz (139.9 kg)  02/06/22 (!) 306 lb (138.8 kg)   Lab Results  Component Value Date   CREATININE 1.35 (H) 06/09/2022   CREATININE 1.41 (H) 06/07/2022   CREATININE 1.26 (H) 02/06/2022  Physical Exam Vitals and nursing note reviewed.  Constitutional:      General: She is not in acute distress.    Appearance: She is well-developed. She is obese.  HENT:     Head: Normocephalic and atraumatic.  Neck:     Vascular: No JVD.  Cardiovascular:     Rate and Rhythm: Normal rate and regular rhythm.     Heart sounds: Normal heart sounds. No murmur heard.    No gallop.  Pulmonary:     Effort: Pulmonary effort is normal. No respiratory distress.     Breath sounds: Normal breath sounds. No wheezing or rales.  Abdominal:     Palpations: Abdomen is soft.     Tenderness: There is no abdominal tenderness.  Musculoskeletal:     Cervical back: Normal range of motion and neck supple.     Right lower leg: No tenderness. Edema (1+ pitting) present.     Left lower leg: Edema (1+ pitting) present.  Skin:    General: Skin is warm and dry.  Neurological:     General: No focal deficit present.     Mental Status: She is alert and oriented to person, place, and time.  Psychiatric:        Mood and Affect: Mood normal.        Behavior:  Behavior normal.    Assessment & Plan:   1: Acute on Chronic heart failure with preserved ejection fraction with structural changes (mild LVH)- - NYHA class III - minimally fluid overloaded today with worsening symptoms - weighing daily; reminded to call for an overnight weight gain of >2 pounds or a weekly weight gain of >5 pounds - weight down 10 pounds since last visit here 3 months ago - not adding salt to her food and is trying to follow a low sodium diet - saw cardiology Mariah Milling) 02/06/22 - on GDMT of carvedilol, entresto, jardiance and spironolactone.  - REDS vest reading done in the office today and it was 56 - to take metolazone 2.5mg  daily for 2 days along with additional 30 ml potassium  - discussed limiting her daily fluid intake to 60-64 ounces total - BNP 06/07/22 was 46.7  2: HTN- - BP looks good (119/57) - sees PCP [facility]) - BMP from 06/07/22 reviewed and showed sodium 141, potassium 3.7, creatinine 1.41 and GFR 46 - check BMP next visit  3: PVC's- - saw EP Lalla Brothers)  04/27/21 - has worn Zio monitor in the past which showed predominantly NSR with frequent PVC's  4: Snoring- - insurance has not approved a sleep lab sleep study - insurance won't approve home sleep study - discussed possible pulmonology referral    Medication list reviewed.   Return in 4 days, sooner if needed.

## 2022-06-09 NOTE — Patient Instructions (Addendum)
Continue weighing daily and call for an overnight weight gain of 3 pounds or more or a weekly weight gain of more than 5 pounds.   If you have voicemail, please make sure your mailbox is cleaned out so that we may leave a message and please make sure to listen to any voicemails.    Take the metolazone and extra potassium daily for 2 days

## 2022-06-13 ENCOUNTER — Other Ambulatory Visit
Admission: RE | Admit: 2022-06-13 | Discharge: 2022-06-13 | Disposition: A | Discharge status: Home / Self Care | Payer: Managed Care, Other (non HMO) | Source: Ambulatory Visit | Attending: Family | Admitting: Family

## 2022-06-13 ENCOUNTER — Telehealth: Payer: Self-pay | Admitting: Family

## 2022-06-13 ENCOUNTER — Ambulatory Visit (HOSPITAL_BASED_OUTPATIENT_CLINIC_OR_DEPARTMENT_OTHER): Payer: Managed Care, Other (non HMO) | Admitting: Family

## 2022-06-13 ENCOUNTER — Encounter: Payer: Self-pay | Admitting: Family

## 2022-06-13 VITALS — BP 125/73 | HR 62 | Resp 20 | Wt 296.2 lb

## 2022-06-13 DIAGNOSIS — I5033 Acute on chronic diastolic (congestive) heart failure: Secondary | ICD-10-CM

## 2022-06-13 DIAGNOSIS — I493 Ventricular premature depolarization: Secondary | ICD-10-CM | POA: Insufficient documentation

## 2022-06-13 DIAGNOSIS — I5032 Chronic diastolic (congestive) heart failure: Secondary | ICD-10-CM | POA: Diagnosis not present

## 2022-06-13 DIAGNOSIS — R0683 Snoring: Secondary | ICD-10-CM

## 2022-06-13 DIAGNOSIS — I110 Hypertensive heart disease with heart failure: Secondary | ICD-10-CM | POA: Insufficient documentation

## 2022-06-13 DIAGNOSIS — Z79899 Other long term (current) drug therapy: Secondary | ICD-10-CM | POA: Insufficient documentation

## 2022-06-13 DIAGNOSIS — I10 Essential (primary) hypertension: Secondary | ICD-10-CM

## 2022-06-13 LAB — BASIC METABOLIC PANEL
Anion gap: 9 (ref 5–15)
BUN: 28 mg/dL — ABNORMAL HIGH (ref 6–20)
CO2: 31 mmol/L (ref 22–32)
Calcium: 9.5 mg/dL (ref 8.9–10.3)
Chloride: 100 mmol/L (ref 98–111)
Creatinine, Ser: 1.57 mg/dL — ABNORMAL HIGH (ref 0.44–1.00)
GFR, Estimated: 41 mL/min — ABNORMAL LOW (ref >60)
Glucose, Bld: 118 mg/dL — ABNORMAL HIGH (ref 70–99)
Potassium: 3.6 mmol/L (ref 3.5–5.1)
Sodium: 140 mmol/L (ref 135–145)

## 2022-06-13 MED ORDER — METOLAZONE 2.5 MG PO TABS: 2.5000 mg | ORAL_TABLET | Freq: Every day | ORAL | 0 refills | Status: DC

## 2022-06-13 NOTE — Progress Notes (Signed)
ReDS Vest / Clip - 06/13/22 1337       ReDS Vest / Clip   Station Marker B    Ruler Value 38    ReDS Actual Value 44

## 2022-06-13 NOTE — Patient Instructions (Addendum)
Continue weighing daily and call for an overnight weight gain of 3 pounds or more or a weekly weight gain of more than 5 pounds.   If you have voicemail, please make sure your mailbox is cleaned out so that we may leave a message and please make sure to listen to any voicemails.     I'll call you after I get your lab results back

## 2022-06-13 NOTE — Telephone Encounter (Signed)
Spoke with patient regarding BMP results obtained earlier today after taking 2 days of metolazone 2.5mg  with additional 80ml potassium.   Potassium is stable at 3.6 and creatinine  is 1.57 with GFR 41.   Will give 2 days of metolazone 2.5mg  with additional 39ml potassium each day. Advised to hold PM torsemide on these 2 days.   Will recheck labs at next visit in 3 days. She verbalized understanding.

## 2022-06-13 NOTE — Progress Notes (Signed)
Patient ID: Heather Norman, female    DOB: Mar 10, 1976, 46 y.o.   MRN: [id number]   Heather Norman is a 46 y/o female with a history of arthritis, DM, hyperlipidemia, HTN, morbid obesity and chronic heart failure.  Echo report from 01/26/22 reviewed and showed an EF of 55-60% along with mild LVH and trivial MR. Echo report from 02/10/21 Left ventricular ejection fraction, by estimation, is 50 to 55%. The left ventricle has low normal function. The left ventricle has no regional wall motion abnormalities. Left ventricular diastolic parameters are consistent with Grade I diastolic dysfunction (impaired relaxation).  Was in the ED 06/07/22 due to SOB and abdominal bloating. Oral diuretic increased and she was released.   She presents today for a follow-up visit with a chief complaint of moderate fatigue with minimal exertion. Describes this as chronic in nature. She has associated abdominal distention (improving), headaches, difficulty sleeping and chronic pain along with this. She denies any dizziness, palpitations, pedal edema, chest pain, wheezing, shortness of breath, cough or weight gain.   Took 2 days of metolazone 2.5mg  along with extra 29ml potassium each day. Home weight declined 5 pounds and she says that she feels better.   Not adding salt. Drinking anywhere from 120-192 ounces of fluids daily because she says that her mouth is "always dry".   Past Medical History:  Diagnosis Date   (HFpEF) heart failure with preserved ejection fraction (HCC)    a. 04/2017 Echo: EF 55-60%, no rwma, Mild MR, mildly dil LA. Nl RV fxn; b. 06/2019 Echo: EF 60-65%, mod LVH. Gr2 DD. Mildly dil LA.   Arthritis    kness/hands - no meds   CHF (congestive heart failure) (HCC)    Diabetes mellitus without complication (HCC)    Dyspnea on exertion    Headache(784.0)    otc med prn   Heart murmur    Hyperlipidemia    no meds since pregnancy   Hypertension    Morbid obesity [facility])    Past Surgical History:   Procedure Laterality Date   CERVICAL CERCLAGE  06/01/2011   Procedure: CERCLAGE CERVICAL;  Surgeon: Philip Aspen, DO;  Location: [facility];  Service: Gynecology;  Laterality: N/A;  REQUESTING PORTABLE ULTARSOUND AT BEDSIDE PT'S EDC:12/02/2011   CERVICAL CERCLAGE  06/01/2011   Procedure: CERCLAGE CERVICAL;  Surgeon: Philip Aspen, DO;  Location: [facility];  Service: Gynecology;  Laterality: N/A;  REQUESTING PORTABLE ULTARSOUND AT BEDSIDE PT'S EDC:12/02/2011   endosocpy     svd     x 2   tab     x 1   TUBAL LIGATION     Family History  Problem Relation Age of Onset   Diabetes Mother    Hypertension Mother    Hyperlipidemia Mother    Hypertension Father    Breast cancer Maternal Grandmother 19   Social History   Tobacco Use   Smoking status: Never   Smokeless tobacco: Never  Substance Use Topics   Alcohol use: No   Allergies  Allergen Reactions   Penicillins Anaphylaxis    Has patient had a PCN reaction causing immediate rash, facial/tongue/throat swelling, SOB or lightheadedness with hypotension: Yes Has patient had a PCN reaction causing severe rash involving mucus membranes or skin necrosis: No Has patient had a PCN reaction that required hospitalization: No Has patient had a PCN reaction occurring within the last 10 years: No If all of the above answers are "NO", then may proceed with Cephalosporin use.   Prior to  Admission medications   Medication Sig Start Date End Date Taking? Authorizing Provider  albuterol (VENTOLIN HFA) 108 (90 Base) MCG/ACT inhaler Inhale 1-2 puffs into the lungs every 6 (six) hours as needed (cough). 04/20/21  Yes Boddu, Belenda Cruise, FNP  aspirin EC 81 MG tablet Take 81 mg by mouth daily.   Yes [provider]  atorvastatin (LIPITOR) 20 MG tablet TAKE 1 TABLET(20 MG) BY MOUTH DAILY 10/07/21  Yes , Inetta Fermo A, FNP  carvedilol (COREG) 25 MG tablet TAKE 1 TABLET BY MOUTH IN THE MORNING AND 1 AND 1/2 TABLETS IN THE EVENING 03/23/22  Yes Gollan,  Tollie Pizza, MD  ENTRESTO 49-51 MG TAKE 1 TABLET BY MOUTH TWICE DAILY 03/04/22  Yes ,  A, FNP  JARDIANCE 10 MG TABS tablet Take 10 mg by mouth daily. 10/10/19  Yes [provider]  latanoprost (XALATAN) 0.005 % ophthalmic solution Place 1 drop into both eyes at bedtime. 01/17/22  Yes [provider]  loratadine (CLARITIN) 10 MG tablet Take 10 mg by mouth daily. Takes daily   Yes [provider]  oxybutynin (DITROPAN) 5 MG tablet Take 5 mg by mouth 2 (two) times daily. 12/25/17  Yes [provider]  OZEMPIC, 0.25 OR 0.5 MG/DOSE, 2 MG/3ML SOPN Inject 0.5 mg into the skin once a week. 02/06/22  Yes [provider]  pantoprazole (PROTONIX) 40 MG tablet Take 40 mg by mouth daily. 01/27/21  Yes [provider]  potassium chloride 20 MEQ/15ML (10%) SOLN Take 58.5 mLs (78 mEq total) by mouth daily. Take 7ml daily 02/06/22  Yes Gollan, Tollie Pizza, MD  spironolactone (ALDACTONE) 25 MG tablet TAKE 1 TABLET(25 MG) BY MOUTH DAILY 04/13/22  Yes Gollan, Tollie Pizza, MD  torsemide (DEMADEX) 20 MG tablet Take 2 tablets (40 mg total) by mouth 2 (two) times daily. 09/16/21  Yes , Inetta Fermo A, FNP  fluticasone (FLONASE) 50 MCG/ACT nasal spray Place 1 spray into both nostrils daily as needed for allergies or rhinitis. Patient not taking: Reported on 06/09/2022    [provider]  naproxen (NAPROSYN) 500 MG tablet Take 1 tablet by mouth as needed. Patient not taking: Reported on 06/09/2022 11/08/21   [provider]   Review of Systems  Constitutional:  Positive for fatigue (baseline). Negative for appetite change.  HENT:  Negative for congestion, postnasal drip and sore throat.   Eyes: Negative.   Respiratory:  Negative for cough, chest tightness, shortness of breath and wheezing.   Cardiovascular:  Negative for chest pain, palpitations and leg swelling.  Gastrointestinal:  Positive for abdominal distention (feel bloated although better).  Negative for abdominal pain.  Endocrine: Negative.   Genitourinary: Negative.   Musculoskeletal:  Positive for arthralgias (right arm pain at times; left heel/foot), back pain and neck pain.  Skin:  Negative for wound.  Allergic/Immunologic: Negative.   Neurological:  Positive for headaches. Negative for dizziness, syncope, light-headedness and numbness.  Hematological:  Negative for adenopathy. Does not bruise/bleed easily.  Psychiatric/Behavioral:  Positive for sleep disturbance (not sleeping well; sleeping on 2 pillows). Negative for dysphoric mood. The patient is not nervous/anxious.    Vitals:   06/13/22 1337  BP: 125/73  Pulse: 62  Resp: 20  SpO2: 98%  Weight: 296 lb 4 oz (134.4 kg)   Wt Readings from Last 3 Encounters:  06/13/22 296 lb 4 oz (134.4 kg)  06/09/22 298 lb (135.2 kg)  03/14/22 (!) 308 lb 6 oz (139.9 kg)   Lab Results  Component Value Date  CREATININE 1.35 (H) 06/09/2022   CREATININE 1.41 (H) 06/07/2022   CREATININE 1.26 (H) 02/06/2022   Physical Exam Vitals and nursing note reviewed.  Constitutional:      General: She is not in acute distress.    Appearance: She is well-developed. She is obese.  HENT:     Head: Normocephalic and atraumatic.  Neck:     Vascular: No JVD.  Cardiovascular:     Rate and Rhythm: Normal rate and regular rhythm.     Heart sounds: Normal heart sounds. No murmur heard.    No gallop.  Pulmonary:     Effort: Pulmonary effort is normal. No respiratory distress.     Breath sounds: Normal breath sounds. No wheezing or rales.  Abdominal:     Palpations: Abdomen is soft.     Tenderness: There is no abdominal tenderness.  Musculoskeletal:     Cervical back: Normal range of motion and neck supple.     Right lower leg: No tenderness. Edema (1+ pitting) present.     Left lower leg: Edema (1+ pitting) present.  Skin:    General: Skin is warm and dry.  Neurological:     General: No focal deficit present.     Mental Status: She is  alert and oriented to person, place, and time.  Psychiatric:        Mood and Affect: Mood normal.        Behavior: Behavior normal.    Assessment & Plan:   1: Acute on Chronic heart failure with preserved ejection fraction with structural changes (mild LVH)- - NYHA class III - minimally fluid overloaded today although improving - weighing daily; reminded to call for an overnight weight gain of >2 pounds or a weekly weight gain of >5 pounds - weight down 2 pounds since last visit here 4 days ago; home weight down 5 pounds - not adding salt to her food and is trying to follow a low sodium diet - saw cardiology Heather Norman) 02/06/22 - on GDMT of carvedilol, entresto, jardiance and spironolactone.  - REDS clip reading done in the office 4 days ago was 56 - Reds clip reading today was 44 - check BMP today since took recent metolazone; after getting results back, will call patient to discuss using more metolazone - discussed limiting her daily fluid intake to 60-64 ounces total - BNP 06/07/22 was 46.7  2: HTN- - BP looks good (125/73) - sees PCP Heather Norman) - BMP from 06/09/22 reviewed and showed sodium 142, potassium 3.7, creatinine 1.35 and GFR 49  3: PVC's- - saw EP Heather Norman)  04/27/21 - has worn Zio monitor in the past which showed predominantly NSR with frequent PVC's  4: Snoring- - insurance has not approved a sleep lab sleep study - insurance won't approve home sleep study - discussed possible pulmonology referral    Medication list reviewed.    Return in 3 days, sooner if needed.

## 2022-06-16 ENCOUNTER — Encounter: Payer: Self-pay | Admitting: Family

## 2022-06-16 ENCOUNTER — Telehealth: Payer: Self-pay | Admitting: Family

## 2022-06-16 ENCOUNTER — Other Ambulatory Visit
Admission: RE | Admit: 2022-06-16 | Discharge: 2022-06-16 | Disposition: A | Discharge status: Home / Self Care | Payer: Managed Care, Other (non HMO) | Source: Ambulatory Visit | Attending: Family | Admitting: Family

## 2022-06-16 ENCOUNTER — Ambulatory Visit (HOSPITAL_BASED_OUTPATIENT_CLINIC_OR_DEPARTMENT_OTHER): Payer: Managed Care, Other (non HMO) | Admitting: Family

## 2022-06-16 VITALS — BP 127/56 | HR 76 | Resp 20 | Wt 298.0 lb

## 2022-06-16 DIAGNOSIS — E785 Hyperlipidemia, unspecified: Secondary | ICD-10-CM | POA: Insufficient documentation

## 2022-06-16 DIAGNOSIS — Z7984 Long term (current) use of oral hypoglycemic drugs: Secondary | ICD-10-CM | POA: Insufficient documentation

## 2022-06-16 DIAGNOSIS — E119 Type 2 diabetes mellitus without complications: Secondary | ICD-10-CM | POA: Insufficient documentation

## 2022-06-16 DIAGNOSIS — R0683 Snoring: Secondary | ICD-10-CM | POA: Insufficient documentation

## 2022-06-16 DIAGNOSIS — R5383 Other fatigue: Secondary | ICD-10-CM | POA: Insufficient documentation

## 2022-06-16 DIAGNOSIS — R0602 Shortness of breath: Secondary | ICD-10-CM | POA: Insufficient documentation

## 2022-06-16 DIAGNOSIS — I10 Essential (primary) hypertension: Secondary | ICD-10-CM

## 2022-06-16 DIAGNOSIS — G479 Sleep disorder, unspecified: Secondary | ICD-10-CM | POA: Insufficient documentation

## 2022-06-16 DIAGNOSIS — I5033 Acute on chronic diastolic (congestive) heart failure: Secondary | ICD-10-CM

## 2022-06-16 DIAGNOSIS — I493 Ventricular premature depolarization: Secondary | ICD-10-CM

## 2022-06-16 DIAGNOSIS — I110 Hypertensive heart disease with heart failure: Secondary | ICD-10-CM | POA: Insufficient documentation

## 2022-06-16 DIAGNOSIS — Z79899 Other long term (current) drug therapy: Secondary | ICD-10-CM | POA: Insufficient documentation

## 2022-06-16 LAB — BASIC METABOLIC PANEL
Anion gap: 8 (ref 5–15)
BUN: 42 mg/dL — ABNORMAL HIGH (ref 6–20)
CO2: 32 mmol/L (ref 22–32)
Calcium: 9.2 mg/dL (ref 8.9–10.3)
Chloride: 101 mmol/L (ref 98–111)
Creatinine, Ser: 1.44 mg/dL — ABNORMAL HIGH (ref 0.44–1.00)
GFR, Estimated: 45 mL/min — ABNORMAL LOW (ref >60)
Glucose, Bld: 154 mg/dL — ABNORMAL HIGH (ref 70–99)
Potassium: 3.6 mmol/L (ref 3.5–5.1)
Sodium: 141 mmol/L (ref 135–145)

## 2022-06-16 MED ORDER — POTASSIUM CHLORIDE 20 MEQ/15ML (10%) PO SOLN: 120.0000 meq | Freq: Every day | ORAL | 5 refills | Status: DC

## 2022-06-16 MED ORDER — TORSEMIDE 20 MG PO TABS: ORAL_TABLET | ORAL | 3 refills | Status: DC

## 2022-06-16 NOTE — Patient Instructions (Addendum)
Continue weighing daily and call for an overnight weight gain of 3 pounds or more or a weekly weight gain of more than 5 pounds.   If you have voicemail, please make sure your mailbox is cleaned out so that we may leave a message and please make sure to listen to any voicemails.    Decrease daily fluid intake to 60-64 ounces daily

## 2022-06-16 NOTE — Progress Notes (Signed)
ReDS Vest / Clip - 06/16/22 1206       ReDS Vest / Clip   Station Marker B    Ruler Value 34    ReDS Actual Value 48

## 2022-06-16 NOTE — Progress Notes (Signed)
Patient ID: Heather Norman, female    DOB: [birth year], 46 y.o.   MRN: [id number]   Ms Hosterman is a 46 y/o female with a history of arthritis, DM, hyperlipidemia, HTN, morbid obesity and chronic heart failure.  Echo report from 01/26/22 reviewed and showed an EF of 55-60% along with mild LVH and trivial MR. Echo report from 02/10/21 Left ventricular ejection fraction, by estimation, is 50 to 55%. The left ventricle has low normal function. The left ventricle has no regional wall motion abnormalities. Left ventricular diastolic parameters are consistent with Grade I diastolic dysfunction (impaired relaxation).  Was in the ED 06/07/22 due to SOB and abdominal bloating. Oral diuretic increased and she was released.   She presents today for a follow-up visit with a chief complaint of moderate fatigue with minimal exertion. Describes this as chronic in nature. She has associated cough, shortness of breath, abdominal distention (bloating), headaches, difficulty sleeping, chronic pain and slight weight gain along with this.   Took 2 days of metolazone 2.5mg  and additional potassium but continues to feel bloated.   Not adding salt. Drinking anywhere from 120-192 ounces of fluids daily because she says that her mouth is "always dry".   Past Medical History:  Diagnosis Date   (HFpEF) heart failure with preserved ejection fraction (HCC)    a. 04/2017 Echo: EF 55-60%, no rwma, Mild MR, mildly dil LA. Nl RV fxn; b. 06/2019 Echo: EF 60-65%, mod LVH. Gr2 DD. Mildly dil LA.   Arthritis    kness/hands - no meds   CHF (congestive heart failure) (HCC)    Diabetes mellitus without complication (HCC)    Dyspnea on exertion    Headache(784.0)    otc med prn   Heart murmur    Hyperlipidemia    no meds since pregnancy   Hypertension    Morbid obesity [facility])    Past Surgical History:  Procedure Laterality Date   CERVICAL CERCLAGE  06/01/2011   Procedure: CERCLAGE CERVICAL;  Surgeon: Philip Aspen, DO;   Location: [facility];  Service: Gynecology;  Laterality: N/A;  REQUESTING PORTABLE ULTARSOUND AT BEDSIDE PT'S EDC:12/02/2011   CERVICAL CERCLAGE  06/01/2011   Procedure: CERCLAGE CERVICAL;  Surgeon: Philip Aspen, DO;  Location: [facility];  Service: Gynecology;  Laterality: N/A;  REQUESTING PORTABLE ULTARSOUND AT BEDSIDE PT'S EDC:12/02/2011   endosocpy     svd     x 2   tab     x 1   TUBAL LIGATION     Family History  Problem Relation Age of Onset   Diabetes Mother    Hypertension Mother    Hyperlipidemia Mother    Hypertension Father    Breast cancer Maternal Grandmother 73   Social History   Tobacco Use   Smoking status: Never   Smokeless tobacco: Never  Substance Use Topics   Alcohol use: No   Allergies  Allergen Reactions   Penicillins Anaphylaxis    Has patient had a PCN reaction causing immediate rash, facial/tongue/throat swelling, SOB or lightheadedness with hypotension: Yes Has patient had a PCN reaction causing severe rash involving mucus membranes or skin necrosis: No Has patient had a PCN reaction that required hospitalization: No Has patient had a PCN reaction occurring within the last 10 years: No If all of the above answers are "NO", then may proceed with Cephalosporin use.   Prior to Admission medications   Medication Sig Start Date End Date Taking? Authorizing Provider  albuterol (VENTOLIN HFA) 108 (90 Base)  MCG/ACT inhaler Inhale 1-2 puffs into the lungs every 6 (six) hours as needed (cough). 04/20/21  Yes Boddu, Belenda Cruise, FNP  aspirin EC 81 MG tablet Take 81 mg by mouth daily.   Yes [provider]  atorvastatin (LIPITOR) 20 MG tablet TAKE 1 TABLET(20 MG) BY MOUTH DAILY 10/07/21  Yes , Inetta Fermo A, FNP  carvedilol (COREG) 25 MG tablet TAKE 1 TABLET BY MOUTH IN THE MORNING AND 1 AND 1/2 TABLETS IN THE EVENING 03/23/22  Yes Gollan, Tollie Pizza, MD  ENTRESTO 49-51 MG TAKE 1 TABLET BY MOUTH TWICE DAILY 03/04/22  Yes ,  A, FNP  JARDIANCE 10 MG TABS  tablet Take 10 mg by mouth daily. 10/10/19  Yes [provider]  latanoprost (XALATAN) 0.005 % ophthalmic solution Place 1 drop into both eyes at bedtime. 01/17/22  Yes [provider]  loratadine (CLARITIN) 10 MG tablet Take 10 mg by mouth daily. Takes daily   Yes [provider]  oxybutynin (DITROPAN) 5 MG tablet Take 5 mg by mouth 2 (two) times daily. 12/25/17  Yes [provider]  OZEMPIC, 0.25 OR 0.5 MG/DOSE, 2 MG/3ML SOPN Inject 0.5 mg into the skin once a week. 02/06/22  Yes [provider]  pantoprazole (PROTONIX) 40 MG tablet Take 40 mg by mouth daily. 01/27/21  Yes [provider]  spironolactone (ALDACTONE) 25 MG tablet TAKE 1 TABLET(25 MG) BY MOUTH DAILY 04/13/22  Yes Gollan, Tollie Pizza, MD  fluticasone (FLONASE) 50 MCG/ACT nasal spray Place 1 spray into both nostrils daily as needed for allergies or rhinitis. Patient not taking: Reported on 06/16/2022    [provider]  potassium chloride 20 MEQ/15ML (10%) SOLN Take 90 mLs (120 mEq total) by mouth daily. 06/16/22   Delma Freeze, FNP  torsemide (DEMADEX) 20 MG tablet Take 60mg  QAM and 40mg  QPM 06/16/22   , FNP   Review of Systems  Constitutional:  Positive for fatigue (baseline). Negative for appetite change.  HENT:  Negative for congestion, postnasal drip and sore throat.   Eyes: Negative.   Respiratory:  Positive for cough (dry) and shortness of breath. Negative for chest tightness and wheezing.   Cardiovascular:  Negative for chest pain, palpitations and leg swelling.  Gastrointestinal:  Positive for abdominal distention (feel bloated although better). Negative for abdominal pain.  Endocrine: Negative.   Genitourinary: Negative.   Musculoskeletal:  Positive for arthralgias (right arm pain at times; left heel/foot), back pain and neck pain.  Skin:  Negative for wound.  Allergic/Immunologic: Negative.   Neurological:  Positive for headaches. Negative for  dizziness, syncope, light-headedness and numbness.  Hematological:  Negative for adenopathy. Does not bruise/bleed easily.  Psychiatric/Behavioral:  Positive for sleep disturbance (not sleeping well; sleeping on 2 pillows). Negative for dysphoric mood. The patient is not nervous/anxious.    Vitals:   06/16/22 1206  BP: (!) 127/56  Pulse: 76  Resp: 20  SpO2: 98%  Weight: 298 lb (135.2 kg)   Wt Readings from Last 3 Encounters:  06/16/22 298 lb (135.2 kg)  06/13/22 296 lb 4 oz (134.4 kg)  06/09/22 298 lb (135.2 kg)   Lab Results  Component Value Date   CREATININE 1.44 (H) 06/16/2022   CREATININE 1.57 (H) 06/13/2022   CREATININE 1.35 (H) 06/09/2022   Physical Exam Vitals and nursing note reviewed.  Constitutional:      General: She is not in acute distress.    Appearance: She is well-developed. She is obese.  HENT:  Head: Normocephalic and atraumatic.  Neck:     Vascular: No JVD.  Cardiovascular:     Rate and Rhythm: Normal rate and regular rhythm.     Heart sounds: Normal heart sounds. No murmur heard.    No gallop.  Pulmonary:     Effort: Pulmonary effort is normal. No respiratory distress.     Breath sounds: Normal breath sounds. No wheezing or rales.  Abdominal:     Palpations: Abdomen is soft.     Tenderness: There is no abdominal tenderness.  Musculoskeletal:     Cervical back: Normal range of motion and neck supple.     Right lower leg: No tenderness. Edema (1+ pitting) present.     Left lower leg: Edema (1+ pitting) present.  Skin:    General: Skin is warm and dry.  Neurological:     General: No focal deficit present.     Mental Status: She is alert and oriented to person, place, and time.  Psychiatric:        Mood and Affect: Mood normal.        Behavior: Behavior normal.    Assessment & Plan:   1: Acute on Chronic heart failure with preserved ejection fraction with structural changes (mild LVH)- - NYHA class III - minimally fluid overloaded today  although stable - weighing daily; reminded to call for an overnight weight gain of >2 pounds or a weekly weight gain of >5 pounds - weight up 2 pounds since last visit here 3 days ago - not adding salt to her food and is trying to follow a low sodium diet - saw cardiology Mariah Milling) 02/06/22 - on GDMT of carvedilol, entresto, jardiance and spironolactone.  - REDS clip reading done in the office 1 week ago was 56 - Reds clip reading done 3 days ago was 44 - ReDs clip reading today was 48 - check BMP today since took recent metolazone and will assess whether we can increase her torsemide - again emphasized the importance of getting daily fluid intake down to 60-64 ounces; currently she's drinking twice that amount as she says that her mouth stays dry "all the time"; encouraged her to use hard candy to suck on - BNP 06/07/22 was 46.7  2: HTN- - BP looks good (127/56 - sees P[facility]) on 06/19/22 (3 days from now) - BMP from 06/13/22 reviewed and showed sodium 140, potassium 3.6, creatinine 1.57 and GFR 41  3: PVC's- - saw EP Lalla Brothers)  04/27/21 - has worn Zio monitor in the past which showed predominantly NSR with frequent PVC's  4: Snoring- - insurance has not approved a sleep lab sleep study - insurance won't approve home sleep study - discussed possible pulmonology referral    Medication list reviewed.   Return in 3 weeks, sooner if needed.

## 2022-06-16 NOTE — Telephone Encounter (Signed)
Called patient to inform of lab results obtained earlier today. Sodium and potassium levels are normal and kidney function has improved slightly. Based on this, will increase her torsemide to 60mg  QAM/ 40mg  QPM (currently taking 40mg  BID). Will also increase her potassium liquid from 70ml daily to 8ml daily.   Plan to recheck labs at next visit if not done elsewhere. She verbalized understanding.

## 2022-06-20 ENCOUNTER — Other Ambulatory Visit: Payer: Self-pay | Admitting: Family

## 2022-06-20 ENCOUNTER — Encounter: Payer: Self-pay | Admitting: Family

## 2022-06-20 DIAGNOSIS — I5032 Chronic diastolic (congestive) heart failure: Secondary | ICD-10-CM

## 2022-06-20 NOTE — Progress Notes (Signed)
Lab orders placed.  

## 2022-06-26 ENCOUNTER — Other Ambulatory Visit
Admission: RE | Admit: 2022-06-26 | Discharge: 2022-06-26 | Disposition: A | Discharge status: Home / Self Care | Payer: Managed Care, Other (non HMO) | Source: Ambulatory Visit | Attending: Family | Admitting: Family

## 2022-06-26 ENCOUNTER — Encounter
Admission: RE | Admit: 2022-06-26 | Discharge: 2022-06-26 | Disposition: A | Discharge status: Home / Self Care | Payer: Managed Care, Other (non HMO) | Source: Ambulatory Visit | Attending: Family | Admitting: Family

## 2022-06-26 ENCOUNTER — Encounter: Payer: Self-pay | Admitting: Family

## 2022-06-26 DIAGNOSIS — Z01812 Encounter for preprocedural laboratory examination: Secondary | ICD-10-CM | POA: Diagnosis not present

## 2022-06-26 DIAGNOSIS — I5032 Chronic diastolic (congestive) heart failure: Secondary | ICD-10-CM | POA: Insufficient documentation

## 2022-06-26 DIAGNOSIS — I509 Heart failure, unspecified: Secondary | ICD-10-CM | POA: Insufficient documentation

## 2022-06-26 LAB — BASIC METABOLIC PANEL
Anion gap: 10 (ref 5–15)
BUN: 30 mg/dL — ABNORMAL HIGH (ref 6–20)
CO2: 26 mmol/L (ref 22–32)
Calcium: 9 mg/dL (ref 8.9–10.3)
Chloride: 103 mmol/L (ref 98–111)
Creatinine, Ser: 1.55 mg/dL — ABNORMAL HIGH (ref 0.44–1.00)
GFR, Estimated: 41 mL/min — ABNORMAL LOW (ref >60)
Glucose, Bld: 180 mg/dL — ABNORMAL HIGH (ref 70–99)
Potassium: 4.3 mmol/L (ref 3.5–5.1)
Sodium: 139 mmol/L (ref 135–145)

## 2022-07-11 ENCOUNTER — Other Ambulatory Visit
Admission: RE | Admit: 2022-07-11 | Discharge: 2022-07-11 | Disposition: A | Discharge status: Home / Self Care | Payer: Managed Care, Other (non HMO) | Source: Ambulatory Visit | Attending: Family | Admitting: Family

## 2022-07-11 ENCOUNTER — Encounter: Payer: Self-pay | Admitting: Family

## 2022-07-11 ENCOUNTER — Ambulatory Visit (HOSPITAL_BASED_OUTPATIENT_CLINIC_OR_DEPARTMENT_OTHER): Payer: Managed Care, Other (non HMO) | Admitting: Family

## 2022-07-11 VITALS — BP 117/52 | HR 63 | Resp 16 | Ht 65.0 in | Wt 307.5 lb

## 2022-07-11 DIAGNOSIS — I5032 Chronic diastolic (congestive) heart failure: Secondary | ICD-10-CM

## 2022-07-11 DIAGNOSIS — I10 Essential (primary) hypertension: Secondary | ICD-10-CM

## 2022-07-11 DIAGNOSIS — I493 Ventricular premature depolarization: Secondary | ICD-10-CM

## 2022-07-11 DIAGNOSIS — R0683 Snoring: Secondary | ICD-10-CM | POA: Diagnosis not present

## 2022-07-11 LAB — BASIC METABOLIC PANEL
Anion gap: 5 (ref 5–15)
BUN: 15 mg/dL (ref 6–20)
CO2: 27 mmol/L (ref 22–32)
Calcium: 8.4 mg/dL — ABNORMAL LOW (ref 8.9–10.3)
Chloride: 107 mmol/L (ref 98–111)
Creatinine, Ser: 1.05 mg/dL — ABNORMAL HIGH (ref 0.44–1.00)
GFR, Estimated: >60 mL/min (ref >60)
Glucose, Bld: 113 mg/dL — ABNORMAL HIGH (ref 70–99)
Potassium: 3.7 mmol/L (ref 3.5–5.1)
Sodium: 139 mmol/L (ref 135–145)

## 2022-07-11 NOTE — Progress Notes (Signed)
[facility] home sleep study given to patient, all instructions explained, and CLOUDPAT registration complete.  

## 2022-07-11 NOTE — Progress Notes (Signed)
Patient ID: Heather Norman, female    DOB: 1976/03/18, 46 y.o.   MRN: [id number]   Heather Norman is a 46 y/o female with a history of arthritis, DM, hyperlipidemia, HTN, morbid obesity and chronic heart failure.  Echo report from 01/26/22 reviewed and showed an EF of 55-60% along with mild LVH and trivial MR. Echo report from 02/10/21 Left ventricular ejection fraction, by estimation, is 50 to 55%. The left ventricle has low normal function. The left ventricle has no regional wall motion abnormalities. Left ventricular diastolic parameters are consistent with Grade I diastolic dysfunction (impaired relaxation).  Was in the ED 06/07/22 due to SOB and abdominal bloating. Oral diuretic increased and she was released.   She presents today for a follow-up visit with a chief complaint of moderate fatigue with minimal exertion. She describes this as chronic in nature. She has associated cough, shortness of breath, headaches, abdominal bloating, chronic pain and difficulty sleeping along with this. She denies any dizziness, palpitations, pedal edema, chest pain, wheezing  Not adding salt. Drinking anywhere from 120-192 ounces of fluids daily because she says that her mouth is "always dry".   Still has not been able to obtain a sleep study.   Past Medical History:  Diagnosis Date   (HFpEF) heart failure with preserved ejection fraction (HCC)    a. 04/2017 Echo: EF 55-60%, no rwma, Mild MR, mildly dil LA. Nl RV fxn; b. 06/2019 Echo: EF 60-65%, mod LVH. Gr2 DD. Mildly dil LA.   Arthritis    kness/hands - no meds   CHF (congestive heart failure) (HCC)    Diabetes mellitus without complication (HCC)    Dyspnea on exertion    Headache(784.0)    otc med prn   Heart murmur    Hyperlipidemia    no meds since pregnancy   Hypertension    Morbid obesity [facility])    Past Surgical History:  Procedure Laterality Date   CERVICAL CERCLAGE  06/01/2011   Procedure: CERCLAGE CERVICAL;  Surgeon: Philip Aspen, DO;   Location: [facility];  Service: Gynecology;  Laterality: N/A;  REQUESTING PORTABLE ULTARSOUND AT BEDSIDE PT'S EDC:12/02/2011   CERVICAL CERCLAGE  06/01/2011   Procedure: CERCLAGE CERVICAL;  Surgeon: Philip Aspen, DO;  Location: [facility];  Service: Gynecology;  Laterality: N/A;  REQUESTING PORTABLE ULTARSOUND AT BEDSIDE PT'S EDC:12/02/2011   endosocpy     svd     x 2   tab     x 1   TUBAL LIGATION     Family History  Problem Relation Age of Onset   Diabetes Mother    Hypertension Mother    Hyperlipidemia Mother    Hypertension Father    Breast cancer Maternal Grandmother 45   Social History   Tobacco Use   Smoking status: Never   Smokeless tobacco: Never  Substance Use Topics   Alcohol use: No   Allergies  Allergen Reactions   Penicillins Anaphylaxis    Has patient had a PCN reaction causing immediate rash, facial/tongue/throat swelling, SOB or lightheadedness with hypotension: Yes Has patient had a PCN reaction causing severe rash involving mucus membranes or skin necrosis: No Has patient had a PCN reaction that required hospitalization: No Has patient had a PCN reaction occurring within the last 10 years: No If all of the above answers are "NO", then may proceed with Cephalosporin use.   Prior to Admission medications   Medication Sig Start Date End Date Taking? Authorizing Provider  aspirin EC 81 MG  tablet Take 81 mg by mouth daily.   Yes [provider]  atorvastatin (LIPITOR) 20 MG tablet TAKE 1 TABLET(20 MG) BY MOUTH DAILY 10/07/21  Yes , Inetta Fermo A, FNP  carvedilol (COREG) 25 MG tablet TAKE 1 TABLET BY MOUTH IN THE MORNING AND 1 AND 1/2 TABLETS IN THE EVENING 03/23/22  Yes Gollan, Tollie Pizza, MD  ENTRESTO 49-51 MG TAKE 1 TABLET BY MOUTH TWICE DAILY 03/04/22  Yes ,  A, FNP  JARDIANCE 10 MG TABS tablet Take 10 mg by mouth daily. 10/10/19  Yes [provider]  latanoprost (XALATAN) 0.005 % ophthalmic solution Place 1 drop into both eyes at bedtime.  01/17/22  Yes [provider]  loratadine (CLARITIN) 10 MG tablet Take 10 mg by mouth daily. Takes daily   Yes [provider]  oxybutynin (DITROPAN) 5 MG tablet Take 5 mg by mouth 2 (two) times daily. 12/25/17  Yes [provider]  pantoprazole (PROTONIX) 40 MG tablet Take 40 mg by mouth daily. 01/27/21  Yes [provider]  potassium chloride 20 MEQ/15ML (10%) SOLN Take 90 mLs (120 mEq total) by mouth daily. 06/16/22  Yes [name], FNP  spironolactone (ALDACTONE) 25 MG tablet TAKE 1 TABLET(25 MG) BY MOUTH DAILY 04/13/22  Yes [name], MD  torsemide (DEMADEX) 20 MG tablet Take 60mg  QAM and 40mg  QPM 06/16/22  Yes , FNP    Review of Systems  Constitutional:  Positive for fatigue (baseline). Negative for appetite change.  HENT:  Negative for congestion, postnasal drip and sore throat.   Eyes: Negative.   Respiratory:  Positive for cough (dry) and shortness of breath. Negative for chest tightness and wheezing.   Cardiovascular:  Negative for chest pain, palpitations and leg swelling.  Gastrointestinal:  Positive for abdominal distention (feel bloated although better). Negative for abdominal pain.  Endocrine: Negative.   Genitourinary: Negative.   Musculoskeletal:  Positive for arthralgias (right arm pain at times; left heel/foot), back pain and neck pain.  Skin:  Negative for wound.  Allergic/Immunologic: Negative.   Neurological:  Positive for headaches. Negative for dizziness, syncope, light-headedness and numbness.  Hematological:  Negative for adenopathy. Does not bruise/bleed easily.  Psychiatric/Behavioral:  Positive for sleep disturbance (not sleeping well; sleeping on 2 pillows). Negative for dysphoric mood. The patient is not nervous/anxious.    Vitals:   07/11/22 1118  BP: (!) 117/52  Pulse: 63  Resp: 16  SpO2: 97%  Weight: (!) 307 lb 8 oz (139.5 kg)  Height: 5\' 5"  (1.651 m)   Wt Readings from Last 3 Encounters:   07/11/22 (!) 307 lb 8 oz (139.5 kg)  06/16/22 298 lb (135.2 kg)  06/13/22 296 lb 4 oz (134.4 kg)   Lab Results  Component Value Date   CREATININE 1.55 (H) 06/26/2022   CREATININE 1.44 (H) 06/16/2022   CREATININE 1.57 (H) 06/13/2022   Physical Exam Vitals and nursing note reviewed.  Constitutional:      General: She is not in acute distress.    Appearance: She is well-developed. She is obese.  HENT:     Head: Normocephalic and atraumatic.  Neck:     Vascular: No JVD.  Cardiovascular:     Rate and Rhythm: Normal rate and regular rhythm.     Heart sounds: Normal heart sounds. No murmur heard.    No gallop.  Pulmonary:     Effort: Pulmonary effort is normal. No respiratory distress.     Breath sounds: Normal breath sounds.  No wheezing or rales.  Abdominal:     Palpations: Abdomen is soft.     Tenderness: There is no abdominal tenderness.  Musculoskeletal:     Cervical back: Normal range of motion and neck supple.     Right lower leg: No tenderness. Edema (1+ pitting) present.     Left lower leg: Edema (1+ pitting) present.  Skin:    General: Skin is warm and dry.  Neurological:     General: No focal deficit present.     Mental Status: She is alert and oriented to person, place, and time.  Psychiatric:        Mood and Affect: Mood normal.        Behavior: Behavior normal.    Assessment & Plan:   1: Chronic heart failure with preserved ejection fraction with structural changes (mild LVH)- - NYHA class III - euvolemic - weighing daily; reminded to call for an overnight weight gain of >2 pounds or a weekly weight gain of >5 pounds - weight up 9 pounds since last visit here 1 month ago; says it's been gradual - not adding salt to her food and is trying to follow a low sodium diet - saw cardiology Mariah Milling) 02/06/22 - on GDMT of carvedilol, entresto, jardiance and spironolactone.  - check BMP today  - again emphasized the importance of getting daily fluid intake down to  60-64 ounces; currently she's drinking twice that amount as she says that her mouth stays dry "all the time"; encouraged her to use hard candy to suck on - discussed referral to Dr. Gala Romney and this was scheduled for 08/08/22 - BNP 06/07/22 was 46.7  2: HTN- - BP looks good (117/52) - saw PCP [facility]) on 06/19/22  - BMP from 06/26/22 reviewed and showed sodium 139, potassium 4.3, creatinine 1.55 and GFR 41 - recheck BMP today  3: PVC's- - saw EP Lalla Brothers)  04/27/21 - has worn Zio monitor in the past which showed predominantly NSR with frequent PVC's  4: Snoring- - insurance has not approved a sleep lab sleep study - discussed possible pulmonology referral  - will attempt [facility] home sleep study; patient advised to NOT open the box until she hears from our office with insurance approval   Medication list reviewed.   She will see Dr. Gala Romney on 08/08/22 and f/u here afterwards.

## 2022-07-11 NOTE — Progress Notes (Signed)
Height:  5'5"   Weight: 307 lb 8 oz BMI: 51.17  Today's Date: 07/11/22  STOP BANG RISK ASSESSMENT S (snore) Have you been told that you snore? Y    YES/NO   T (tired) Are you often tired, fatigued, or sleepy during the day? Y  YES/NO  O (obstruction) Do you stop breathing, choke, or gasp during sleep? Y YES/NO   P (pressure) Do you have or are you being treated for high blood pressure? Y YES/NO   B (BMI) Is your body index greater than 35 kg/m? Y YES/NO   A (age) Are you 31 years old or older? N YES/NO   N (neck) Do you have a neck circumference greater than 16 inches?  Y  YES/NO   G (gender) Are you a female? N YES/NO   TOTAL STOP/BANG "YES" ANSWERS       6                                                                        For Office Use Only              Procedure Order Form    YES to 3+ Stop Bang questions OR two clinical symptoms - patient qualifies for WatchPAT (CPT 95800)      Clinical Notes: Will consult Sleep Specialist and refer for management of therapy due to patient increased risk of Sleep Apnea. Ordering a sleep study due to the following two clinical symptoms: Loud snoring R06.83 / History of high blood pressure R03.0

## 2022-07-11 NOTE — Patient Instructions (Addendum)
Continue weighing daily and call for an overnight weight gain of 3 pounds or more or a weekly weight gain of more than 5 pounds.   If you have voicemail, please make sure your mailbox is cleaned out so that we may leave a message and please make sure to listen to any voicemails.   Your provider has recommended that you have a home sleep study.  We have provided you with the equipment in our office today. Please go ahead and download the app. DO NOT OPEN THE BOX UNTIL WE ADVISE YOU TO DO SO, once insurance has approved. Once given permission to proceed with the test, open althe app and follow the instructions. YOUR PIN NUMBER IS: [id number]. Once you have completed the test you just dispose of the equipment, the information is automatically uploaded to [place] via blue-tooth technology. If your test is positive for sleep apnea and you need a home CPAP machine you will be contacted by Dr Norris Cross office [facility]) to set this up.   Follow up: Dr. Gala Romney at the [facility] on August 08, 2022 at 10:20

## 2022-07-19 ENCOUNTER — Ambulatory Visit: Payer: Self-pay

## 2022-07-19 IMAGING — CR DG OS CALCIS 2+V*L*
1 series · 2 of 2 positions shown · non-contrast
Comparison: None.

CLINICAL DATA: Left heel pain when bearing weight for a few months.

EXAM:
LEFT OS CALCIS - 2+ VIEW

[Series 1: dg os calcis left · 0.14mm/px · 2 of 2 slices shown]
[im 1/2]
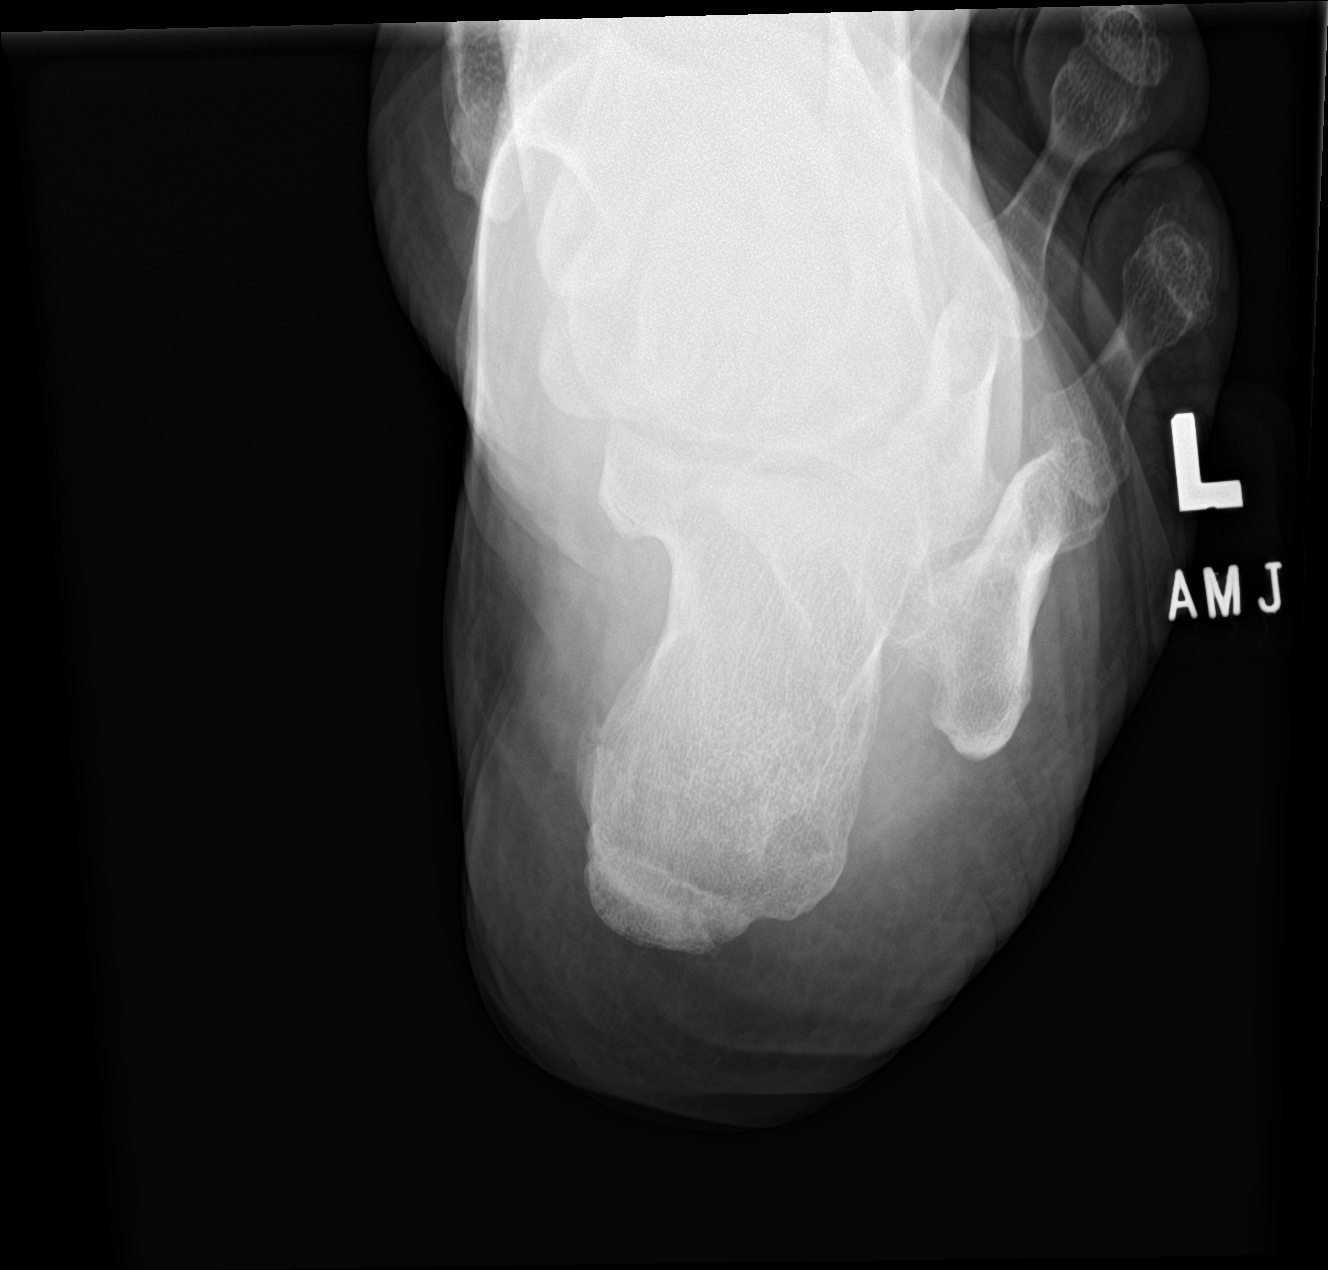
[im 2/2]
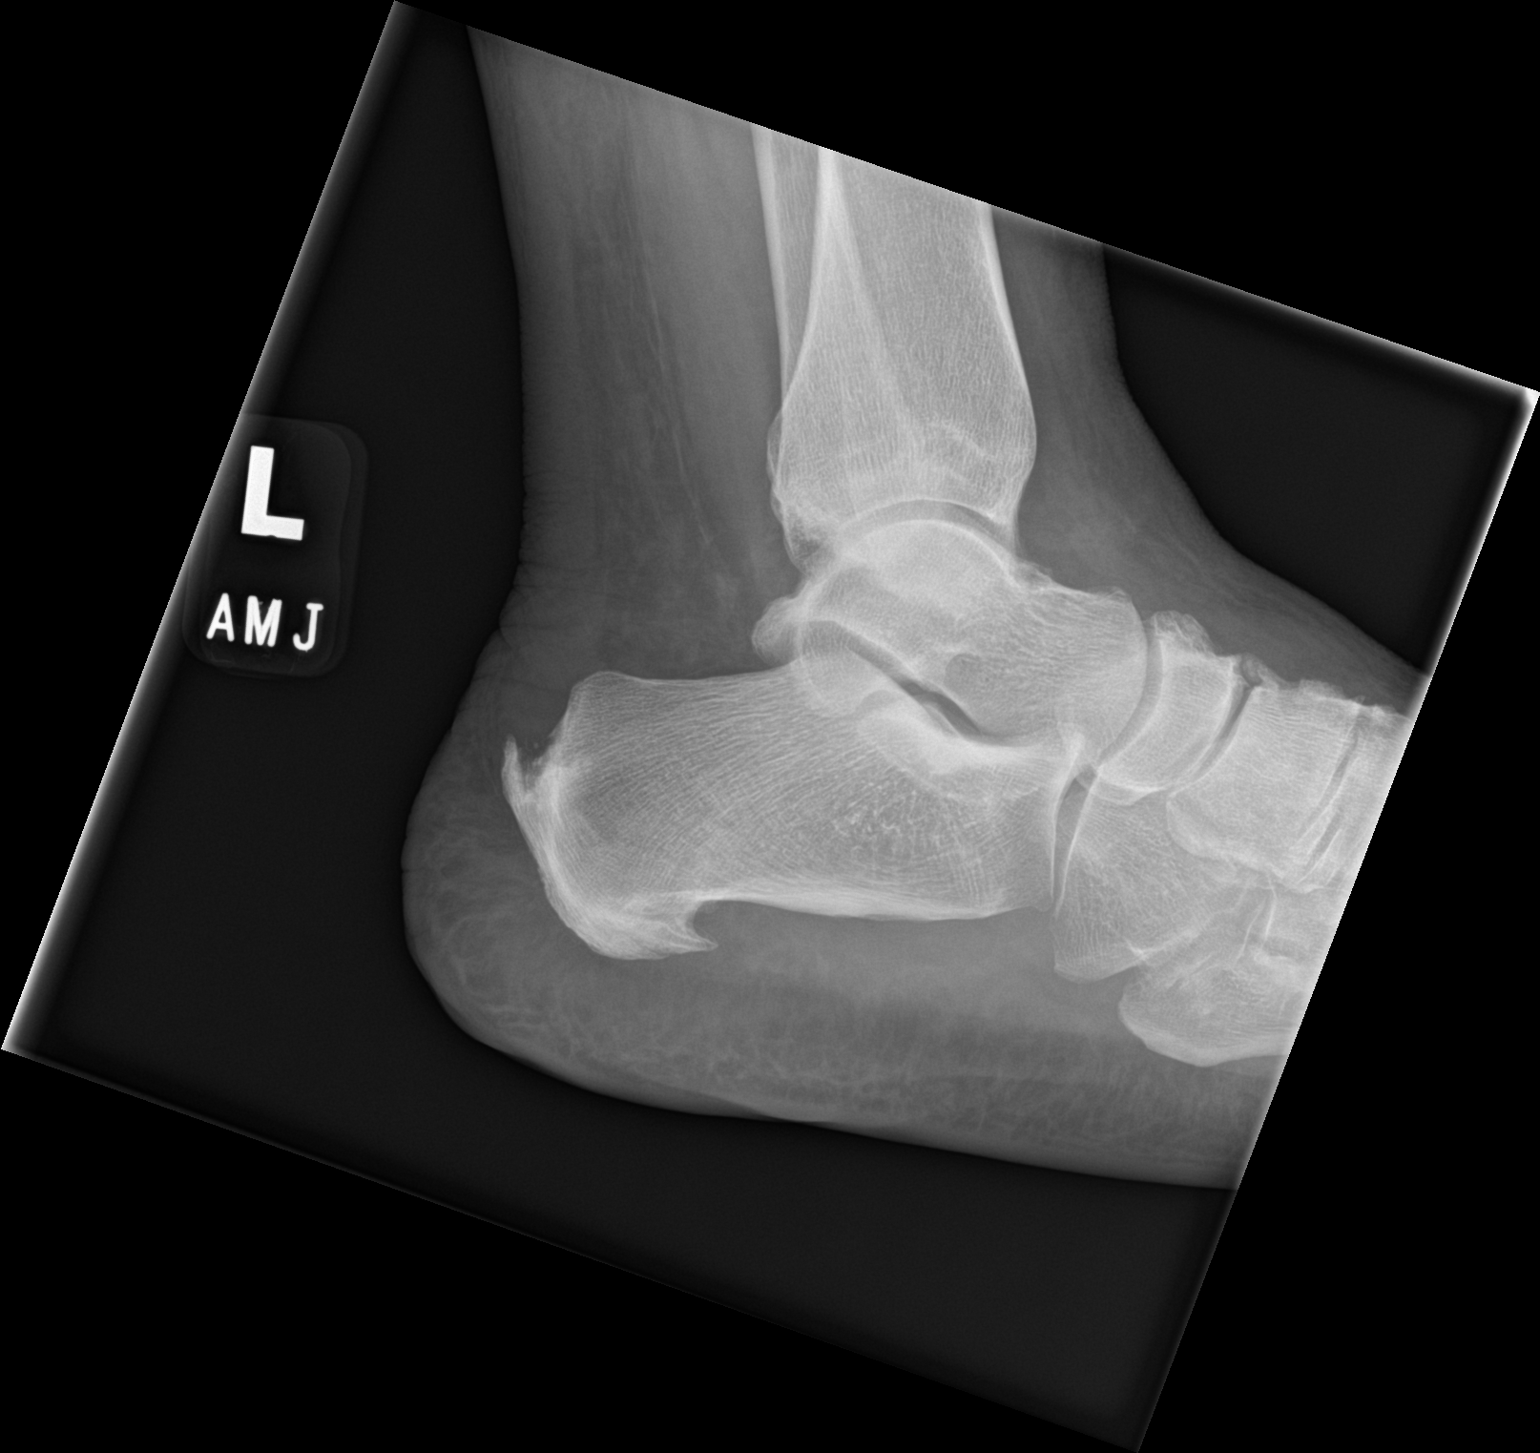

[2 of 2 positions shown; findings below may reference images not displayed]

FINDINGS: Moderate plantar calcaneal spur. Moderate Achilles tendon
enthesophyte with mild thickening of the soft tissues at the
Achilles insertion. No fracture, erosion, periosteal reaction or
bone destruction. Normal subtalar alignment. Unremarkable soft
tissues.
IMPRESSION: 1. Moderate plantar calcaneal spur.
2. Moderate Achilles tendon enthesophyte with mild thickening of the
soft tissues at the Achilles insertion, can be seen with insertional
Achilles tendinopathy.

## 2022-07-20 ENCOUNTER — Ambulatory Visit
Admission: RE | Admit: 2022-07-20 | Discharge: 2022-07-20 | Disposition: A | Discharge status: Home / Self Care | Payer: Managed Care, Other (non HMO) | Source: Ambulatory Visit | Attending: Emergency Medicine | Admitting: Emergency Medicine

## 2022-07-20 VITALS — BP 140/82 | HR 78 | Temp 98.1°F | Resp 18 | Ht 65.0 in | Wt 307.5 lb

## 2022-07-20 DIAGNOSIS — J209 Acute bronchitis, unspecified: Secondary | ICD-10-CM | POA: Diagnosis not present

## 2022-07-20 DIAGNOSIS — J0100 Acute maxillary sinusitis, unspecified: Secondary | ICD-10-CM | POA: Diagnosis not present

## 2022-07-20 MED ORDER — PREDNISONE 10 MG PO TABS: 40.0000 mg | ORAL_TABLET | Freq: Every day | ORAL | 0 refills | Status: AC

## 2022-07-20 MED ORDER — ALBUTEROL SULFATE HFA 108 (90 BASE) MCG/ACT IN AERS: 1.0000 | INHALATION_SPRAY | Freq: Four times a day (QID) | RESPIRATORY_TRACT | 0 refills | Status: AC | PRN

## 2022-07-20 MED ORDER — AZITHROMYCIN 250 MG PO TABS: 250.0000 mg | ORAL_TABLET | Freq: Every day | ORAL | 0 refills | Status: DC

## 2022-07-20 NOTE — ED Triage Notes (Signed)
Patient to Urgent Care with complaints of cough. States that initially she got sick on last Thursday with URI symptoms.   Reports that her cough is wet sounding but dry and she is now hoarse.   Denies any recent fever.  Has been taking coricidin.

## 2022-07-20 NOTE — ED Provider Notes (Signed)
Heather Norman    CSN: [id number] Arrival date & time: 07/20/22  1056      History   Chief Complaint Chief Complaint  Patient presents with   Cough    I have been sick since last Thursday. Start as a sore throat, shortness of breath, body aches, and dry cough. Now it's the same but my cough is a wet cough. I have cough so much my voice is gone. - Entered by patient    HPI Heather Norman is a 46 y.o. female.  Patient presents with 7-day history of congestion and cough.  Her voice is hoarse.  She denies fever, sore throat, chest pain, shortness of breath, vomiting, diarrhea, or other symptoms.  Treatment at home with Coricidin.  Her  medical history includes hypertension, heart failure, diabetes, morbid obesity.  The history is provided by the patient and medical records.    Past Medical History:  Diagnosis Date   (HFpEF) heart failure with preserved ejection fraction (HCC)    a. 04/2017 Echo: EF 55-60%, no rwma, Mild MR, mildly dil LA. Nl RV fxn; b. 06/2019 Echo: EF 60-65%, mod LVH. Gr2 DD. Mildly dil LA.   Arthritis    kness/hands - no meds   CHF (congestive heart failure) (HCC)    Diabetes mellitus without complication (HCC)    Dyspnea on exertion    Headache(784.0)    otc med prn   Heart murmur    Hyperlipidemia    no meds since pregnancy   Hypertension    Morbid obesity (HCC)     Patient Active Problem List   Diagnosis Date Noted   Numbness and tingling 06/20/2021   Difficulty sleeping 06/20/2021   Blurred vision 06/20/2021   Type 2 diabetes mellitus without complication, without long-term current use of insulin (HCC) 05/19/2018   DM (diabetes mellitus) (HCC) 05/11/2017   Snoring 05/11/2017   Morbid obesity (HCC) 08/30/2015   Chronic diastolic CHF (congestive heart failure) (HCC) 08/30/2015   Essential hypertension 02/16/2015   Elderly multigravida with antepartum condition or complication 05/22/2011    Past Surgical History:  Procedure Laterality  Date   CERVICAL CERCLAGE  06/01/2011   Procedure: CERCLAGE CERVICAL;  Surgeon: Philip Aspen, DO;  Location: [facility];  Service: Gynecology;  Laterality: N/A;  REQUESTING PORTABLE ULTARSOUND AT BEDSIDE PT'S EDC:12/02/2011   CERVICAL CERCLAGE  06/01/2011   Procedure: CERCLAGE CERVICAL;  Surgeon: Philip Aspen, DO;  Location: [facility];  Service: Gynecology;  Laterality: N/A;  REQUESTING PORTABLE ULTARSOUND AT BEDSIDE PT'S EDC:12/02/2011   endosocpy     svd     x 2   tab     x 1   TUBAL LIGATION      OB History     Gravida  5   Para  2   Term  1   Preterm  1   AB  2   Living  2      SAB  2   IAB  0   Ectopic  0   Multiple  0   Live Births               Home Medications    Prior to Admission medications   Medication Sig Start Date End Date Taking? Authorizing Provider  albuterol (VENTOLIN HFA) 108 (90 Base) MCG/ACT inhaler Inhale 1-2 puffs into the lungs every 6 (six) hours as needed. 07/20/22  Yes Mickie Bail, NP  azithromycin (ZITHROMAX) 250 MG tablet Take 1 tablet (250 mg total)  by mouth daily. Take first 2 tablets together, then 1 every day until finished. 07/20/22  Yes Mickie Bail, NP  predniSONE (DELTASONE) 10 MG tablet Take 4 tablets (40 mg total) by mouth daily for 3 days. 07/20/22 07/23/22 Yes Mickie Bail, NP  aspirin EC 81 MG tablet Take 81 mg by mouth daily.    [provider]  atorvastatin (LIPITOR) 20 MG tablet TAKE 1 TABLET(20 MG) BY MOUTH DAILY 10/07/21   [name], FNP  carvedilol (COREG) 25 MG tablet TAKE 1 TABLET BY MOUTH IN THE MORNING AND 1 AND 1/2 TABLETS IN THE EVENING 03/23/22   [name], MD  ENTRESTO 49-51 MG TAKE 1 TABLET BY MOUTH TWICE DAILY 03/04/22   [name], FNP  JARDIANCE 10 MG TABS tablet Take 10 mg by mouth daily. 10/10/19   [provider]  latanoprost (XALATAN) 0.005 % ophthalmic solution Place 1 drop into both eyes at bedtime. 01/17/22   [provider]  loratadine (CLARITIN) 10  MG tablet Take 10 mg by mouth daily. Takes daily    [provider]  oxybutynin (DITROPAN) 5 MG tablet Take 5 mg by mouth 2 (two) times daily. 12/25/17   [provider]  pantoprazole (PROTONIX) 40 MG tablet Take 40 mg by mouth daily. 01/27/21   [provider]  potassium chloride 20 MEQ/15ML (10%) SOLN Take 90 mLs (120 mEq total) by mouth daily. 06/16/22   Delma Freeze, FNP  spironolactone (ALDACTONE) 25 MG tablet TAKE 1 TABLET(25 MG) BY MOUTH DAILY 04/13/22   [name], MD  torsemide (DEMADEX) 20 MG tablet Take 60mg  QAM and 40mg  QPM 06/16/22   , FNP    Family History Family History  Problem Relation Age of Onset   Diabetes Mother    Hypertension Mother    Hyperlipidemia Mother    Hypertension Father    Breast cancer Maternal Grandmother 59    Social History Social History   Tobacco Use   Smoking status: Never   Smokeless tobacco: Never  Vaping Use   Vaping Use: Never used  Substance Use Topics   Alcohol use: No   Drug use: No     Allergies   Penicillins   Review of Systems Review of Systems  Constitutional:  Negative for chills and fever.  HENT:  Positive for congestion and voice change. Negative for ear pain and sore throat.   Respiratory:  Positive for cough. Negative for shortness of breath.   Cardiovascular:  Negative for chest pain and palpitations.  Gastrointestinal:  Negative for diarrhea and vomiting.  Skin:  Negative for color change and rash.  All other systems reviewed and are negative.    Physical Exam Triage Vital Signs ED Triage Vitals  Enc Vitals Group     BP 07/20/22 1117 (!) 140/82     Pulse Rate 07/20/22 1114 78     Resp 07/20/22 1114 18     Temp 07/20/22 1114 98.1 F (36.7 C)     Temp src --      SpO2 07/20/22 1114 97 %     Weight 07/20/22 1116 (!) 307 lb 8 oz (139.5 kg)     Height 07/20/22 1116 5\' 5"  (1.651 m)     Head Circumference --      Peak Flow --      Pain Score 07/20/22 1114  3     Pain Loc --      Pain Edu? --  Excl. in GC? --    No data found.  Updated Vital Signs BP (!) 140/82   Pulse 78   Temp 98.1 F (36.7 C)   Resp 18   Ht 5\' 5"  (1.651 m)   Wt (!) 307 lb 8 oz (139.5 kg)   LMP 08/15/2021   SpO2 97%   BMI 51.17 kg/m   Visual Acuity Right Eye Distance:   Left Eye Distance:   Bilateral Distance:    Right Eye Near:   Left Eye Near:    Bilateral Near:     Physical Exam Vitals and nursing note reviewed.  Constitutional:      General: She is not in acute distress.    Appearance: She is well-developed. She is obese. She is not ill-appearing.  HENT:     Right Ear: Tympanic membrane normal.     Left Ear: Tympanic membrane normal.     Nose: Congestion and rhinorrhea present.     Mouth/Throat:     Mouth: Mucous membranes are moist.     Pharynx: Oropharynx is clear.  Cardiovascular:     Rate and Rhythm: Normal rate and regular rhythm.     Heart sounds: Normal heart sounds.  Pulmonary:     Effort: Pulmonary effort is normal. No respiratory distress.     Breath sounds: Normal breath sounds. No wheezing.  Musculoskeletal:     Cervical back: Neck supple.  Skin:    General: Skin is warm and dry.  Neurological:     Mental Status: She is alert.  Psychiatric:        Mood and Affect: Mood normal.        Behavior: Behavior normal.      UC Treatments / Results  Labs (all labs ordered are listed, but only abnormal results are displayed) Labs Reviewed - No data to display  EKG   Radiology No results found.  Procedures Procedures (including critical care time)  Medications Ordered in UC Medications - No data to display  Initial Impression / Assessment and Plan / UC Course  I have reviewed the triage vital signs and the nursing notes.  Pertinent labs & imaging results that were available during my care of the patient were reviewed by me and considered in my medical decision making (see chart for details).    Acute bronchitis  and sinusitis.  Patient has been symptomatic for 7 days.  Treating today with albuterol inhaler, prednisone, and Zithromax.  Instructed patient to follow up with her PCP if her symptoms are not improving.  She agrees to plan of care.    Final Clinical Impressions(s) / UC Diagnoses   Final diagnoses:  Acute bronchitis, unspecified organism  Acute non-recurrent maxillary sinusitis     Discharge Instructions      Take the prednisone and Zithromax as directed.  Use the albuterol inhaler as directed.  Follow up with your primary care provider if your symptoms are not improving.        ED Prescriptions     Medication Sig Dispense Auth. Provider   azithromycin (ZITHROMAX) 250 MG tablet Take 1 tablet (250 mg total) by mouth daily. Take first 2 tablets together, then 1 every day until finished. 6 tablet 08/17/2021, NP   predniSONE (DELTASONE) 10 MG tablet Take 4 tablets (40 mg total) by mouth daily for 3 days. 12 tablet Mickie Bail, NP   albuterol (VENTOLIN HFA) 108 (90 Base) MCG/ACT inhaler Inhale 1-2 puffs into the lungs every 6 (six) hours  as needed. 18 g Mickie Bail, NP      PDMP not reviewed this encounter.   Mickie Bail, NP 07/20/22 [phone]

## 2022-07-20 NOTE — Discharge Instructions (Addendum)
Take the prednisone and Zithromax as directed.  Use the albuterol inhaler as directed.  ° °Follow up with your primary care provider if your symptoms are not improving.   ° ° °

## 2022-07-24 ENCOUNTER — Encounter (HOSPITAL_BASED_OUTPATIENT_CLINIC_OR_DEPARTMENT_OTHER): Payer: Managed Care, Other (non HMO) | Admitting: Cardiology

## 2022-07-24 DIAGNOSIS — G4733 Obstructive sleep apnea (adult) (pediatric): Secondary | ICD-10-CM

## 2022-07-25 ENCOUNTER — Ambulatory Visit: Payer: Managed Care, Other (non HMO) | Attending: Family

## 2022-07-25 DIAGNOSIS — I10 Essential (primary) hypertension: Secondary | ICD-10-CM

## 2022-07-25 DIAGNOSIS — R0683 Snoring: Secondary | ICD-10-CM

## 2022-07-25 NOTE — Procedures (Signed)
SLEEP STUDY REPORT Patient Information Study Date: 07/24/2022 Patient Name: Heather Norman Patient ID: [id number] Birth Date: 08/21/1975 Age: 46 Gender: Female BMI: 51.1 (W=306 lb, H=5' 5'') Stopbang: 6 Referring Physician: Clarisa Kindred FNP  TEST DESCRIPTION: Home sleep apnea testing was completed using the WatchPat, a Type 1 device, utilizing peripheral arterial tonometry (PAT), chest movement, actigraphy, pulse oximetry, pulse rate, body position and snore.  AHI was calculated with apnea and hypopnea using valid sleep time as the denominator. RDI includes apneas, hypopneas, and RERAs.  The data acquired and the scoring of sleep and all associated events were performed in accordance with the recommended standards and specifications as outlined in the AASM Manual for the Scoring of Sleep and Associated Events 2.2.0 (2015).  FINDINGS:  1.  Severe Obstructive Sleep Apnea with AHI 35.4/hr.   2.  No Central Sleep Apnea with pAHIc 0.8/hr.  3.  Oxygen desaturations as low as 75%.  4.  Mild snoring was present. O2 sats were < 88% for 0.1 min.  5.  Total sleep time was 7 hrs and 0 min.  6.  27.2% of total sleep time was spent in REM sleep.   7.  Normal sleep onset latency at 19 min.   8.  Shortened REM sleep onset latency at 81 min.   9.  Total awakenings were 13.  10. Arrhythmia detection:  Suggestive of possible brief atrial fibrillation lasting 4 hrs  34 min and 45 seconds.  This is not diagnostic and further testing with outpatient telemetry monitoring is recommended.   DIAGNOSIS:   Severe Obstructive Sleep Apnea (G47.33) Possible Atrial Fibrillation  RECOMMENDATIONS:   1.  Clinical correlation of these findings is necessary.  The decision to treat obstructive sleep apnea (OSA) is usually based on the presence of apnea symptoms or the presence of associated medical conditions such as Hypertension, Congestive Heart Failure, Atrial Fibrillation or Obesity.  The most common symptoms of  OSA are snoring, gasping for breath while sleeping, daytime sleepiness and fatigue.   2.  Initiating apnea therapy is recommended given the presence of symptoms and/or associated conditions. Recommend proceeding with one of the following:     a.  Auto-CPAP therapy with a pressure range of 5-20cm H2O.     b.  An oral appliance (OA) that can be obtained from certain dentists with expertise in sleep medicine.  These are primarily of use in non-obese patients with mild and moderate disease.     c.  An ENT consultation which may be useful to look for specific causes of obstruction and possible treatment options.     d.  If patient is intolerant to PAP therapy, consider referral to ENT for evaluation for hypoglossal nerve stimulator.   3.  Close follow-up is necessary to ensure success with CPAP or oral appliance therapy for maximum benefit.  4.  A follow-up oximetry study on CPAP is recommended to assess the adequacy of therapy and determine the need for supplemental oxygen or the potential need for Bi-level therapy.  An arterial blood gas to determine the adequacy of baseline ventilation and oxygenation should also be considered.  5.  Healthy sleep recommendations include:  adequate nightly sleep (normal 7-9 hrs/night), avoidance of caffeine after noon and alcohol near bedtime, and maintaining a sleep environment that is cool, dark and quiet.  6.  Weight loss for overweight patients is recommended.  Even modest amounts of weight loss can significantly improve the severity of sleep apnea.  7.  Snoring recommendations  include:  weight loss where appropriate, side sleeping, and avoidance of alcohol before bed.  8.  Operation of motor vehicle should be avoided when sleepy.  9.  Consider outpatient event monitor to assess for silent atrial fibrillation if clinically indicated.  Signature: Armanda Magic, MD; [facility]; Diplomat, American Board of Sleep Medicine Electronically Signed: 07/25/2022

## 2022-08-02 ENCOUNTER — Ambulatory Visit: Payer: Managed Care, Other (non HMO) | Attending: Cardiovascular Disease

## 2022-08-02 ENCOUNTER — Telehealth: Payer: Self-pay

## 2022-08-02 DIAGNOSIS — I4891 Unspecified atrial fibrillation: Secondary | ICD-10-CM

## 2022-08-02 NOTE — Telephone Encounter (Signed)
Per Dr. Rockey Situ, recommending ordering 14 day Zio based on finding from sleep study. Pt made aware and verbalized understanding.

## 2022-08-07 DIAGNOSIS — I4891 Unspecified atrial fibrillation: Secondary | ICD-10-CM

## 2022-08-08 ENCOUNTER — Ambulatory Visit: Payer: Managed Care, Other (non HMO) | Attending: Internal Medicine | Admitting: Internal Medicine

## 2022-08-08 ENCOUNTER — Encounter: Payer: Self-pay | Admitting: Internal Medicine

## 2022-08-08 VITALS — BP 148/78 | HR 75 | Ht 65.0 in | Wt 305.4 lb

## 2022-08-08 DIAGNOSIS — I493 Ventricular premature depolarization: Secondary | ICD-10-CM

## 2022-08-08 DIAGNOSIS — E785 Hyperlipidemia, unspecified: Secondary | ICD-10-CM | POA: Diagnosis not present

## 2022-08-08 DIAGNOSIS — I110 Hypertensive heart disease with heart failure: Secondary | ICD-10-CM | POA: Insufficient documentation

## 2022-08-08 DIAGNOSIS — G4733 Obstructive sleep apnea (adult) (pediatric): Secondary | ICD-10-CM

## 2022-08-08 DIAGNOSIS — E119 Type 2 diabetes mellitus without complications: Secondary | ICD-10-CM | POA: Diagnosis not present

## 2022-08-08 DIAGNOSIS — M1990 Unspecified osteoarthritis, unspecified site: Secondary | ICD-10-CM | POA: Diagnosis not present

## 2022-08-08 DIAGNOSIS — I10 Essential (primary) hypertension: Secondary | ICD-10-CM

## 2022-08-08 DIAGNOSIS — I4891 Unspecified atrial fibrillation: Secondary | ICD-10-CM | POA: Diagnosis not present

## 2022-08-08 DIAGNOSIS — I5032 Chronic diastolic (congestive) heart failure: Secondary | ICD-10-CM

## 2022-08-08 MED ORDER — ENTRESTO 97-103 MG PO TABS: 1.0000 | ORAL_TABLET | Freq: Two times a day (BID) | ORAL | 3 refills | Status: DC

## 2022-08-08 NOTE — Patient Instructions (Signed)
Medication Changes: STOP taking Entresto 49/51 mg twice daily BEGIN taking Entresto 97/103 mg twice daily  Lab Work:  No labs drawn today  Testing/Procedures:  You had an ECG done at your appointment today.   Referrals:  Contacted Dr. Theodosia Blender office today related to sleep study and obtaining CPAP   Special Instructions // Education:  Do the following things EVERYDAY: Weigh yourself in the morning before breakfast. Write it down and keep it in a log. Take your medicines as prescribed Eat low salt foods--Limit salt (sodium) to 2000 mg per day.  Stay as active as you can everyday Limit all fluids for the day to less than 2 liters   Follow-Up in: 2 months with Dr. Haroldine Laws. We will call you closer to the appointment date to schedule    If you have any questions or concerns before your next appointment please send [place] a message through mychart or call our office at [phone] Monday-Friday 8 am-5 pm.   If you have an urgent need after hours on the weekend please call your Primary Cardiologist or the [facility] in [place] at [phone].

## 2022-08-08 NOTE — Progress Notes (Signed)
ADVANCED HF CLINIC CONSULT NOTE  Referring Provider: Darylene Price, NP Primary [facility]: [facility] [place] Primary Cardiologist: Ida Rogue, MD   HPI:   Ms Klare is a 47 y/o female with a history of arthritis, DM, hyperlipidemia, HTN, morbid obesity and chronic heart failure.   Echo report from 01/26/22 reviewed and showed an EF of 55-60% along with mild LVH and trivial MR. Echo report from 02/10/21 Left ventricular ejection fraction, by estimation, is 50 to 55%. The left ventricle has low normal function. The left ventricle has no regional wall motion abnormalities. Left ventricular diastolic parameters are consistent with Grade I diastolic dysfunction (impaired relaxation).  Zio 10/21 12.5% PVCs Saw EP Quentin Ore) and suggested possible AAD (flecainide or propafenone) if developed symptoms or LV dysfunction.    Was in the ED 06/07/22 due to SOB and abdominal bloating. Oral diuretic increased and she was released.    Sleep study 07/24/22: AHI 35.4 Sats down to 75%. Has not started CPAP yet. Possible AF during study lasting 4.5 hours. 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 Can go to the store and do other activities without stopping as long as she takes her time. SOB with hills and steps. No CP, orthopnea or PND. Tried Ozempic but felt it didn't work for her. AppleWatch says 62% AF between [name] 1 and [name] 7   Review of Systems: [y] = yes, [ ]  = no   General: Weight gain [ ] ; Weight loss [ ] ; Anorexia [ ] ; Fatigue Blue.Reese ]; Fever [ ] ; Chills [ ] ; Weakness [ ]   Cardiac: Chest pain/pressure [ ] ; Resting SOB [ ] ; Exertional SOB Blue.Reese ]; Orthopnea [ ] ; Pedal Edema Blue.Reese ]; Palpitations [ ] ; Syncope [ ] ; Presyncope [ ] ; Paroxysmal nocturnal dyspnea[ ]   Pulmonary: Cough [ ] ; Wheezing[ ] ; Hemoptysis[ ] ; Sputum [ ] ; Snoring [ ]   GI: Vomiting[ ] ; Dysphagia[ ] ;  Melena[ ] ; Hematochezia [ ] ; Heartburn[ ] ; Abdominal pain [ ] ; Constipation [ ] ; Diarrhea [ ] ; BRBPR [ ]   GU: Hematuria[ ] ; Dysuria [ ] ; Nocturia[ ]   Vascular: Pain in legs with walking [ ] ; Pain in feet with lying flat [ ] ; Non-healing sores [ ] ; Stroke [ ] ; TIA [ ] ; Slurred speech [ ] ;  Neuro: Headaches[y ]; Vertigo[ ] ; Seizures[ ] ; Paresthesias[ ] ;Blurred vision [ ] ; Diplopia [ ] ; Vision changes [ ]   Ortho/Skin: Arthritis [ ] ; Joint pain [ ] ; Muscle pain [ ] ; Joint swelling [ ] ; Back Pain [ ] ; Rash [ ]   Psych: Depression[ ] ; Anxiety[ ]   Heme: Bleeding problems [ ] ; Clotting disorders [ ] ; Anemia [ ]   Endocrine: Diabetes [ ] ; Thyroid dysfunction[ ]    Past Medical History:  Diagnosis Date   (HFpEF) heart failure with preserved ejection fraction ([place])    a. 04/2017 Echo: EF 55-60%, no rwma, Mild MR, mildly dil LA. Nl RV fxn; b. 06/2019 Echo: EF 60-65%, mod LVH. Gr2 DD. Mildly dil LA.   Arthritis    kness/hands - no meds   CHF (congestive heart failure) (HCC)    Diabetes mellitus without complication (HCC)    Dyspnea on exertion    Headache(784.0)    otc med prn   Heart murmur    Hyperlipidemia    no meds since pregnancy   Hypertension    Morbid obesity ([place])  Current Outpatient Medications  Medication Sig Dispense Refill   albuterol (VENTOLIN HFA) 108 (90 Base) MCG/ACT inhaler Inhale 1-2 puffs into the lungs every 6 (six) hours as needed. 18 g 0   aspirin EC 81 MG tablet Take 81 mg by mouth daily.     atorvastatin (LIPITOR) 20 MG tablet TAKE 1 TABLET(20 MG) BY MOUTH DAILY 90 tablet 3   carvedilol (COREG) 25 MG tablet TAKE 1 TABLET BY MOUTH IN THE MORNING AND 1 AND 1/2 TABLETS IN THE EVENING 225 tablet 3   ENTRESTO 49-51 MG TAKE 1 TABLET BY MOUTH TWICE DAILY 60 tablet 5   JARDIANCE 10 MG TABS tablet Take 10 mg by mouth daily.     latanoprost (XALATAN) 0.005 % ophthalmic solution Place 1 drop into both eyes at bedtime.     loratadine (CLARITIN) 10 MG tablet Take 10 mg by mouth  daily. Takes daily     oxybutynin (DITROPAN) 5 MG tablet Take 5 mg by mouth 2 (two) times daily.     pantoprazole (PROTONIX) 40 MG tablet Take 40 mg by mouth daily.     potassium chloride 20 MEQ/15ML (10%) SOLN Take 90 mLs (120 mEq total) by mouth daily. 1800 mL 5   spironolactone (ALDACTONE) 25 MG tablet TAKE 1 TABLET(25 MG) BY MOUTH DAILY 90 tablet 3   torsemide (DEMADEX) 20 MG tablet Take 60mg  QAM and 40mg  QPM 360 tablet 3   No current facility-administered medications for this visit.    Allergies  Allergen Reactions   Penicillins Anaphylaxis    Has patient had a PCN reaction causing immediate rash, facial/tongue/throat swelling, SOB or lightheadedness with hypotension: Yes Has patient had a PCN reaction causing severe rash involving mucus membranes or skin necrosis: No Has patient had a PCN reaction that required hospitalization: No Has patient had a PCN reaction occurring within the last 10 years: No If all of the above answers are "NO", then may proceed with Cephalosporin use.      Social History   Socioeconomic History   Marital status: Single    Spouse name: Not on file   Number of children: 3   Years of education: 12   Highest education level: 12th grade  Occupational History   Not on file  Tobacco Use   Smoking status: Never   Smokeless tobacco: Never  Vaping Use   Vaping Use: Never used  Substance and Sexual Activity   Alcohol use: No   Drug use: No   Sexual activity: Yes    Birth control/protection: Surgical  Other Topics Concern   Not on file  Social History Narrative   Not on file   Social Determinants of Health   Financial Resource Strain: Medium Risk (08/13/2017)   Overall Financial Resource Strain (CARDIA)    Difficulty of Paying Living Expenses: Somewhat hard  Food Insecurity: Food Insecurity Present (08/13/2017)   Hunger Vital Sign    Worried About Running Out of Food in the Last Year: Often true    Ran Out of Food in the Last Year: Sometimes  true  Transportation Needs: No Transportation Needs (08/13/2017)   PRAPARE - 08/15/2017 (Medical): No    Lack of Transportation (Non-Medical): No  Physical Activity: Sufficiently Active (06/09/2019)   Exercise Vital Sign    Days of Exercise per Week: 5 days    Minutes of Exercise per Session: 150+ min  Stress: Stress Concern Present (08/13/2017)   13/04/2019 of Occupational Health - Occupational Stress  Questionnaire    Feeling of Stress : Rather much  Social Connections: Moderately Isolated (08/13/2017)   Social Connection and Isolation Panel [NHANES]    Frequency of Communication with Friends and Family: More than three times a week    Frequency of Social Gatherings with Friends and Family: Never    Attends Religious Services: Never    Database administrator or Organizations: No    Attends Banker Meetings: Never    Marital Status: Never married  Intimate Partner Violence: Not At Risk (06/09/2019)   Humiliation, Afraid, Rape, and Kick questionnaire    Fear of Current or Ex-Partner: No    Emotionally Abused: No    Physically Abused: No    Sexually Abused: No      Family History  Problem Relation Age of Onset   Diabetes Mother    Hypertension Mother    Hyperlipidemia Mother    Hypertension Father    Breast cancer Maternal Grandmother 50    Vitals:   08/08/22 1028  BP: (!) 148/78  Pulse: 75  SpO2: 99%  Weight: (!) 305 lb 6 oz (138.5 kg)  Height: 5\' 5"  (1.651 m)   Wt Readings from Last 3 Encounters:  08/08/22 (!) 305 lb 6 oz (138.5 kg)  07/20/22 (!) 307 lb 8 oz (139.5 kg)  07/11/22 (!) 307 lb 8 oz (139.5 kg)    PHYSICAL EXAM: General:  Obese woman. Well appearing. No respiratory difficulty HEENT: normal Neck: supple. no JVD. Carotids 2+ bilat; no bruits. No lymphadenopathy or thryomegaly appreciated. Cor: PMI nondisplaced. Mildly irregular rate & rhythm. No rubs, gallops or murmurs. Lungs: clear Abdomen: obese soft,  nontender, nondistended. No hepatosplenomegaly. No bruits or masses. Good bowel sounds. Extremities: no cyanosis, clubbing, rash, edema Neuro: alert & oriented x 3, cranial nerves grossly intact. moves all 4 extremities w/o difficulty. Affect pleasant.  ECG: NSR 90 frequent PVCs QRS 52ms Personally reviewed   ASSESSMENT & PLAN:   1: Chronic heart failure with preserved ejection fraction - NYHA class II-III - Volume status looks good.  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 7/23 for treatment of OSA   3: PVC's, frequent - Zio 10/21 12.5% PVCs Saw EP 11/21) and suggested possible AAD (flecainide or propafenone) if developed symptoms or LV dysfunction.  - suspect driven by OSA - currently has repeat Zio on - If PVCs persist after treatment of OSA and/or EF drops will need AAD for PVC suppression    4: Possible AF  - noted on sleep study and AppleWatch - suspect this is likely PVCs - await current Zio results (followed by Dr. Lalla Brothers)  5. OSA, severe - Sleep study 07/24/22: AHI 35.4 Sats down to 75%. Has not started CPAP yet. - refer to Dr. 07/26/22 ASAP - discussed role of weight loss  6. Morbid obesity - has been on Ozempic in past without success - ? Trial of terzepitide  Mayford Knife, MD  11:10 AM

## 2022-08-10 ENCOUNTER — Telehealth: Payer: Self-pay | Admitting: *Deleted

## 2022-08-10 DIAGNOSIS — G4733 Obstructive sleep apnea (adult) (pediatric): Secondary | ICD-10-CM

## 2022-08-10 DIAGNOSIS — I5032 Chronic diastolic (congestive) heart failure: Secondary | ICD-10-CM

## 2022-08-10 DIAGNOSIS — I10 Essential (primary) hypertension: Secondary | ICD-10-CM

## 2022-08-10 NOTE — Addendum Note (Signed)
Addended by: Freada Bergeron on: 08/10/2022 06:09 PM   Modules accepted: Orders

## 2022-08-10 NOTE — Telephone Encounter (Addendum)
The patient has been notified of the result. Left detailed message on voicemail and informed patient to call back.Marolyn Hammock, CMA  RETURN CALL: The patient has been notified of the result and verbalized understanding.  All questions (if any) were answered. Marolyn Hammock, CMA 08/10/2022 3:61 PM    Will precert CPAP TITRATION

## 2022-08-10 NOTE — Telephone Encounter (Signed)
-----   Message from Lauralee Evener, [place] sent at 08/01/2022 10:47 AM EST -----  ----- Message ----- From: Sueanne Margarita, MD Sent: 07/25/2022   6:00 PM EST To: Cv Div Sleep Studies  Please let patient know that they have sleep apnea.  Recommend therapeutic CPAP titration for treatment of patient's sleep disordered breathing.  If unable to perform an in lab titration then initiate ResMed auto CPAP from 4 to 15cm H2O with heated humidity and mask of choice and overnight pulse ox on CPAP.

## 2022-08-23 ENCOUNTER — Encounter: Payer: Self-pay | Admitting: Emergency Medicine

## 2022-08-23 ENCOUNTER — Emergency Department
Admission: EM | Admit: 2022-08-23 | Discharge: 2022-08-23 | Disposition: A | Discharge status: Home / Self Care | Payer: Managed Care, Other (non HMO) | Attending: Emergency Medicine | Admitting: Emergency Medicine

## 2022-08-23 ENCOUNTER — Other Ambulatory Visit: Payer: Self-pay

## 2022-08-23 DIAGNOSIS — R5383 Other fatigue: Secondary | ICD-10-CM | POA: Insufficient documentation

## 2022-08-23 DIAGNOSIS — E119 Type 2 diabetes mellitus without complications: Secondary | ICD-10-CM | POA: Insufficient documentation

## 2022-08-23 DIAGNOSIS — R110 Nausea: Secondary | ICD-10-CM | POA: Diagnosis not present

## 2022-08-23 DIAGNOSIS — R197 Diarrhea, unspecified: Secondary | ICD-10-CM | POA: Insufficient documentation

## 2022-08-23 DIAGNOSIS — I10 Essential (primary) hypertension: Secondary | ICD-10-CM | POA: Insufficient documentation

## 2022-08-23 DIAGNOSIS — R42 Dizziness and giddiness: Secondary | ICD-10-CM | POA: Diagnosis present

## 2022-08-23 DIAGNOSIS — R630 Anorexia: Secondary | ICD-10-CM | POA: Diagnosis not present

## 2022-08-23 LAB — CBC
HCT: 45.4 % (ref 36.0–46.0)
Hemoglobin: 14.3 g/dL (ref 12.0–15.0)
MCH: 28.5 pg (ref 26.0–34.0)
MCHC: 31.5 g/dL (ref 30.0–36.0)
MCV: 90.6 fL (ref 80.0–100.0)
Platelets: 322 10*3/uL (ref 150–400)
RBC: 5.01 MIL/uL (ref 3.87–5.11)
RDW: 14.5 % (ref 11.5–15.5)
WBC: 7.8 10*3/uL (ref 4.0–10.5)
nRBC: 0 % (ref 0.0–0.2)

## 2022-08-23 LAB — BASIC METABOLIC PANEL
Anion gap: 7 (ref 5–15)
BUN: 13 mg/dL (ref 6–20)
CO2: 27 mmol/L (ref 22–32)
Calcium: 8.3 mg/dL — ABNORMAL LOW (ref 8.9–10.3)
Chloride: 105 mmol/L (ref 98–111)
Creatinine, Ser: 1.13 mg/dL — ABNORMAL HIGH (ref 0.44–1.00)
GFR, Estimated: >60 mL/min (ref >60)
Glucose, Bld: 162 mg/dL — ABNORMAL HIGH (ref 70–99)
Potassium: 4.5 mmol/L (ref 3.5–5.1)
Sodium: 139 mmol/L (ref 135–145)

## 2022-08-23 LAB — CBG MONITORING, ED: Glucose-Capillary: 136 mg/dL — ABNORMAL HIGH (ref 70–99)

## 2022-08-23 MED ORDER — ONDANSETRON 4 MG PO TBDP: 4.0000 mg | ORAL_TABLET | Freq: Three times a day (TID) | ORAL | 0 refills | Status: DC | PRN

## 2022-08-23 MED ORDER — ONDANSETRON 8 MG PO TBDP
8.0000 mg | ORAL_TABLET | Freq: Once | ORAL | Status: AC
  Administered 2022-08-23: 8 mg via ORAL
  Filled 2022-08-23: qty 1

## 2022-08-23 MED ORDER — MECLIZINE HCL 25 MG PO TABS
25.0000 mg | ORAL_TABLET | Freq: Once | ORAL | Status: AC
  Administered 2022-08-23: 25 mg via ORAL
  Filled 2022-08-23: qty 1

## 2022-08-23 MED ORDER — MECLIZINE HCL 25 MG PO TABS: 25.0000 mg | ORAL_TABLET | Freq: Three times a day (TID) | ORAL | 1 refills | Status: DC | PRN

## 2022-08-23 NOTE — ED Triage Notes (Signed)
C/O bilateral hand numbness, nausea this morning.  States did not feel well yesterday and also c/o dizziness x 2-3 days intermittently.  States dizziness occurs when sitting up to fast or changing position too quickly.  AAOx3.  Skin warm and dry. NAD. Ambulates with easy and steady gait.

## 2022-08-23 NOTE — ED Provider Notes (Signed)
[facility] Provider Note    Event Date/Time   First MD Initiated Contact with Patient 08/23/22 1100     (approximate)   History   Chief Complaint: Numbness, Nausea, and Dizziness   HPI  Heather Norman is a 47 y.o. female with a history of hypertension, diabetes who comes ED complaining of dizziness and nausea that started a few days ago.  Worse with changing position and turning her head.  This morning also had some diarrhea and has some decreased appetite and fatigue.  The dizziness is associated with tingling in both hands.  These are both recurrent issues for her.     Physical Exam   Triage Vital Signs: ED Triage Vitals  Enc Vitals Group     BP 08/23/22 0855 (!) 148/121     Pulse Rate 08/23/22 0855 73     Resp 08/23/22 0855 16     Temp 08/23/22 0853 97.6 F (36.4 C)     Temp Source 08/23/22 0853 Oral     SpO2 08/23/22 0855 98 %     Weight 08/23/22 0853 297 lb 9.9 oz (135 kg)     Height 08/23/22 0853 5\' 5"  (1.651 m)     Head Circumference --      Peak Flow --      Pain Score 08/23/22 0853 0     Pain Loc --      Pain Edu? --      Excl. in [place]? --     Most recent vital signs: Vitals:   08/23/22 0855 08/23/22 1248  BP: (!) 148/121 94/62  Pulse: 73 70  Resp: 16 16  Temp:  97.7 F (36.5 C)  SpO2: 98% 97%    General: Awake, no distress.  CV:  Good peripheral perfusion.  Regular rate and rhythm Resp:  Normal effort.  Clear to auscultation Abd:  No distention.  Soft nontender Other:  Cranial nerves III through XII intact.  No nystagmus.  Moist oral mucosa   ED Results / Procedures / Treatments   Labs (all labs ordered are listed, but only abnormal results are displayed) Labs Reviewed  BASIC METABOLIC PANEL - Abnormal; Notable for the following components:      Result Value   Glucose, Bld 162 (*)    Creatinine, Ser 1.13 (*)    Calcium 8.3 (*)    All other components within normal limits  CBG MONITORING, ED - Abnormal;  Notable for the following components:   Glucose-Capillary 136 (*)    All other components within normal limits  CBC     EKG Interpreted by me Normal sinus rhythm rate of 71.  Normal axis and intervals.  Poor R wave progression.  Normal ST segments and T waves.   RADIOLOGY    PROCEDURES:  Procedures   MEDICATIONS ORDERED IN ED: Medications  ondansetron (ZOFRAN-ODT) disintegrating tablet 8 mg (8 mg Oral Given 08/23/22 1134)  meclizine (ANTIVERT) tablet 25 mg (25 mg Oral Given 08/23/22 1134)     IMPRESSION / MDM / ASSESSMENT AND PLAN / ED COURSE  I reviewed the triage vital signs and the nursing notes.                              Differential diagnosis includes, but is not limited to, viral syndrome, peripheral vertigo, migraine, dehydration, electrolyte abnormality, anemia  Patient's presentation is most consistent with acute presentation with potential threat to life or  bodily function.  Patient presents with head tingling and vertigo sensation which has been intermittent for the past few days.  Today she is also having additional symptoms which are suggestive of a viral illness.  Vital signs are unremarkable, clinically she appears well-hydrated, with no pain or evidence of an infectious source.  Patient given meclizine and Zofran, feeling better and suitable for discharge home.       FINAL CLINICAL IMPRESSION(S) / ED DIAGNOSES   Final diagnoses:  Vertigo     Rx / DC Orders   ED Discharge Orders          Ordered    meclizine (ANTIVERT) 25 MG tablet  3 times daily PRN        08/23/22 1316    ondansetron (ZOFRAN-ODT) 4 MG disintegrating tablet  Every 8 hours PRN        08/23/22 1316             Note:  This document was prepared using Dragon voice recognition software and may include unintentional dictation errors.   Carrie Mew, MD 08/23/22 [phone]

## 2022-10-11 ENCOUNTER — Ambulatory Visit: Payer: Managed Care, Other (non HMO) | Attending: Internal Medicine | Admitting: Internal Medicine

## 2022-10-11 ENCOUNTER — Encounter: Payer: Self-pay | Admitting: Internal Medicine

## 2022-10-11 VITALS — BP 100/60 | HR 69 | Wt 305.6 lb

## 2022-10-11 DIAGNOSIS — G4733 Obstructive sleep apnea (adult) (pediatric): Secondary | ICD-10-CM | POA: Diagnosis not present

## 2022-10-11 DIAGNOSIS — I493 Ventricular premature depolarization: Secondary | ICD-10-CM | POA: Diagnosis not present

## 2022-10-11 DIAGNOSIS — I5032 Chronic diastolic (congestive) heart failure: Secondary | ICD-10-CM

## 2022-10-11 NOTE — Patient Instructions (Signed)
TAKE Torsemide '80mg'$  in the morning and '60mg'$  in the evening for 2 days then decrease back to '60mg'$  in the morning and '40mg'$  in the evening  Your provider requests you have a cardiac MRI  (We will call you to schedule your appointment)  Follow up in 3 months  Do the following things EVERYDAY: Weigh yourself in the morning before breakfast. Write it down and keep it in a log. Take your medicines as prescribed Eat low salt foods--Limit salt (sodium) to 2000 mg per day.  Stay as active as you can everyday Limit all fluids for the day to less than 2 liters

## 2022-10-11 NOTE — Progress Notes (Addendum)
ADVANCED HF CLINIC CONSULT NOTE  Referring Provider: Darylene Price, NP Primary Care: [facility] [place] Primary Cardiologist: Ida Rogue, MD   HPI:   Ms Standefer is a 47 y/o female with a history of arthritis, DM, hyperlipidemia, HTN, morbid obesity and chronic heart failure.   Echo report from 01/26/22 reviewed and showed an EF of 55-60% along with mild LVH and trivial MR. Echo report from 02/10/21 Left ventricular ejection fraction, by estimation, is 50 to 55%. The left ventricle has low normal function. The left ventricle has no regional wall motion abnormalities. Left ventricular diastolic parameters are consistent with Grade I diastolic dysfunction (impaired relaxation).  Zio 10/21 12.5% PVCs Saw EP Quentin Ore) and suggested possible AAD (flecainide or propafenone) if developed symptoms or LV dysfunction.    Was in the ED 06/07/22 due to SOB and abdominal bloating. Oral diuretic increased and she was released.    Sleep study 07/24/22: AHI 35.4 Sats down to 75%. Has not started CPAP yet. Possible AF during study lasting 4.5 hours. Zio placed    I saw her in 1/24 for first time. Entresto increased due to elevated BP. Referred to Dr. Radford Pax for OSA.   Zio patch 1/24  - Sinus rhythm. 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 Easily SOB. Using inhaler more. Chest flutterings improved. Has not heard from sleep clinic yet.    Past Medical History:  Diagnosis Date   (HFpEF) heart failure with preserved ejection fraction ([place])    a. 04/2017 Echo: EF 55-60%, no rwma, Mild MR, mildly dil LA. Nl RV fxn; b. 06/2019 Echo: EF 60-65%, mod LVH. Gr2 DD. Mildly dil LA.   Arthritis    kness/hands - no meds   CHF (congestive heart failure) (HCC)    Diabetes mellitus without complication (HCC)    DM (diabetes mellitus) ([place]) 05/11/2017   Dyspnea on exertion    Headache(784.0)     otc med prn   Heart murmur    Hyperlipidemia    no meds since pregnancy   Hypertension    Morbid obesity (HCC)     Current Outpatient Medications  Medication Sig Dispense Refill   albuterol (VENTOLIN HFA) 108 (90 Base) MCG/ACT inhaler Inhale 1-2 puffs into the lungs every 6 (six) hours as needed. 18 g 0   aspirin EC 81 MG tablet Take 81 mg by mouth daily.     atorvastatin (LIPITOR) 20 MG tablet TAKE 1 TABLET(20 MG) BY MOUTH DAILY 90 tablet 3   carvedilol (COREG) 25 MG tablet TAKE 1 TABLET BY MOUTH IN THE MORNING AND 1 AND 1/2 TABLETS IN THE EVENING 225 tablet 3   JARDIANCE 10 MG TABS tablet Take 10 mg by mouth daily.     latanoprost (XALATAN) 0.005 % ophthalmic solution Place 1 drop into both eyes at bedtime.     loratadine (CLARITIN) 10 MG tablet Take 10 mg by mouth daily. Takes daily     meclizine (ANTIVERT) 25 MG tablet Take 1 tablet (25 mg total) by mouth 3 (three) times daily as needed for dizziness or nausea. 30 tablet 1   ondansetron (ZOFRAN-ODT) 4 MG disintegrating tablet Take 1 tablet (4 mg total) by mouth every 8 (eight) hours as needed for nausea or vomiting. 20 tablet 0   oxybutynin (DITROPAN) 5 MG tablet Take 5 mg by mouth 2 (two) times daily.  pantoprazole (PROTONIX) 40 MG tablet Take 40 mg by mouth daily.     potassium chloride 20 MEQ/15ML (10%) SOLN Take 90 mLs (120 mEq total) by mouth daily. 1800 mL 5   sacubitril-valsartan (ENTRESTO) 97-103 MG Take 1 tablet by mouth 2 (two) times daily. 60 tablet 3   spironolactone (ALDACTONE) 25 MG tablet TAKE 1 TABLET(25 MG) BY MOUTH DAILY 90 tablet 3   torsemide (DEMADEX) 20 MG tablet Take '60mg'$  QAM and '40mg'$  QPM 360 tablet 3   No current facility-administered medications for this visit.    Allergies  Allergen Reactions   Penicillins Anaphylaxis    Has patient had a PCN reaction causing immediate rash, facial/tongue/throat swelling, SOB or lightheadedness with hypotension: Yes Has patient had a PCN reaction causing severe  rash involving mucus membranes or skin necrosis: No Has patient had a PCN reaction that required hospitalization: No Has patient had a PCN reaction occurring within the last 10 years: No If all of the above answers are "NO", then may proceed with Cephalosporin use.      Social History   Socioeconomic History   Marital status: Single    Spouse name: Not on file   Number of children: 3   Years of education: 12   Highest education level: 12th grade  Occupational History   Not on file  Tobacco Use   Smoking status: Never   Smokeless tobacco: Never  Vaping Use   Vaping Use: Never used  Substance and Sexual Activity   Alcohol use: No   Drug use: No   Sexual activity: Yes    Birth control/protection: Surgical  Other Topics Concern   Not on file  Social History Narrative   Not on file   Social Determinants of Health   Financial Resource Strain: Medium Risk (08/13/2017)   Overall Financial Resource Strain (CARDIA)    Difficulty of Paying Living Expenses: Somewhat hard  Food Insecurity: Food Insecurity Present (08/13/2017)   Hunger Vital Sign    Worried About Running Out of Food in the Last Year: Often true    Ran Out of Food in the Last Year: Sometimes true  Transportation Needs: No Transportation Needs (08/13/2017)   PRAPARE - Hydrologist (Medical): No    Lack of Transportation (Non-Medical): No  Physical Activity: Sufficiently Active (06/09/2019)   Exercise Vital Sign    Days of Exercise per Week: 5 days    Minutes of Exercise per Session: 150+ min  Stress: Stress Concern Present (08/13/2017)   [place]    Feeling of Stress : Rather much  Social Connections: Moderately Isolated (08/13/2017)   Social Connection and Isolation Panel [NHANES]    Frequency of Communication with Friends and Family: More than three times a week    Frequency of Social Gatherings with Friends and Family:  Never    Attends Religious Services: Never    Marine scientist or Organizations: No    Attends Archivist Meetings: Never    Marital Status: Never married  Intimate Partner Violence: Not At Risk (06/09/2019)   Humiliation, Afraid, Rape, and Kick questionnaire    Fear of Current or Ex-Partner: No    Emotionally Abused: No    Physically Abused: No    Sexually Abused: No      Family History  Problem Relation Age of Onset   Diabetes Mother    Hypertension Mother    Hyperlipidemia Mother  Hypertension Father    Breast cancer Maternal Grandmother 50    Vitals:   10/11/22 1151  BP: 100/60  Pulse: 69  SpO2: 96%  Weight: (!) 305 lb 9.6 oz (138.6 kg)    Wt Readings from Last 3 Encounters:  10/11/22 (!) 305 lb 9.6 oz (138.6 kg)  08/23/22 297 lb 9.9 oz (135 kg)  08/08/22 (!) 305 lb 6 oz (138.5 kg)    PHYSICAL EXAM: General:  Sitting in chair No resp difficulty HEENT: normal Neck: supple. JVP hard to see. Carotids 2+ bilat; no bruits. No lymphadenopathy or thryomegaly appreciated. Cor: PMI nondisplaced. Regular rate & rhythm. Occasional ectopy No rubs, gallops or murmurs. Lungs: clear Abdomen: soft, nontender, nondistended. No hepatosplenomegaly. No bruits or masses. Good bowel sounds. Extremities: no cyanosis, clubbing, rash, tr-1+ edema Neuro: alert & orientedx3, cranial nerves grossly intact. moves all 4 extremities w/o difficulty. 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  - Zio patch 1/24. Sinus rhythm. No AF. 44 runs NSVT (longest 8 beats). 21.3% PVCs (3 morphologies 9.7%, 7.3%, 4.3%) - suspect driven by OSA but could aslo be infiltrative process (? Sarcoid) -> Check cMRI - Has f/u with Dr. Quentin Ore this month will need AAD (I was thinking mexilitene but I think he favors flecainide/propfenone). I will defer to him    4: Possible AF  - noted on sleep study and AppleWatch - No AF on Zio -> suspect this is likely PVCs  5. OSA, severe - Sleep study 07/24/22: AHI 35.4 Sats down to 75%. Has not started CPAP yet. - discussed role of weight loss - I again repeated the referral to Dr. Theodosia Blender office  6. Morbid obesity - has been on Ozempic in past without success - ? Trial of terzepitide  Glori Bickers, MD  12:11 PM

## 2022-10-12 ENCOUNTER — Telehealth: Payer: Self-pay | Admitting: *Deleted

## 2022-10-12 NOTE — Telephone Encounter (Signed)
Patient is scheduled 10/25/22.

## 2022-10-12 NOTE — Telephone Encounter (Signed)
-----   Message from Sueanne Margarita, MD sent at 10/11/2022  9:33 PM EDT ----- Gae Bon I ordered an ASAP CPAP titration on 07/25/22 and it is still sitting in the active order que - this is absolutely unacceptable. Please call sleep lab tomorrow to get her in for CPAP titration in the next week.   [place] this is happening much more frequently over the past year.  I usually read the sleep studies within 24 to 48 hours and put the orders in at that time.  I am finding more and more patients where I have placed an order for a CPAP titration or have have placed an order for auto CPAP and months later they still have not gotten their sleep study or PAP device and the order is still sitting in active order queue.  The above patient to Dr. Haroldine Laws send I went into encounters and left and I did read on 26 December and placed a order to the sleep team to send patient for in lab study and this has not left the active queue yet.  We need to find out what is going on with this.  I cannot believe that this is all related to waiting on insurance approval.  Can you please put this at the top of her list to look into thank you ----- Message ----- From: Jolaine Artist, MD Sent: 10/11/2022  12:17 PM EDT To: Sueanne Margarita, MD; Freada Bergeron, [place] team. Can you please get her in for CPAP initiation?  Thanks -dan

## 2022-10-22 NOTE — Progress Notes (Unsigned)
  Electrophysiology Office Follow up Visit Note:    Date:  10/25/2022   ID:  JERICKA SYSLO, DOB 1975-08-09, MRN [id number]  PCP:  [facility] [place] Cardiologist:  Ida Rogue, MD  [facility] Electrophysiologist:  Vickie Epley, MD    Interval History:    Heather Norman is a 47 y.o. female who presents for a follow up visit.   I last saw the patient April 27, 2021 for frequent PVCs. She tells me that she is felt about the same since I last saw her.  Intermittently uses an inhaler for bronchial spasm.      Past medical, surgical, social and family history were reviewed.  ROS:   Please see the history of present illness.    All other systems reviewed and are negative.  EKGs/Labs/Other Studies Reviewed:    The following studies were reviewed today:  January 26, 2022 echo showed EF 55, RV normal  September 17, 2022 ZIO monitor with 21% PVC burden  EKG:  The ekg ordered today demonstrates sinus rhythm with multifocal PVCs.  There is a fairly narrow QRS PVCs with a left superior axis and then a steeply inferior directed PVC that is much wider.  Otherwise sinus rhythm.   Physical Exam:    VS:  BP 128/86   Pulse 90   Ht 5\' 5"  (1.651 m)   Wt (!) 309 lb (140.2 kg)   LMP 08/15/2021   SpO2 96%   BMI 51.42 kg/m     Wt Readings from Last 3 Encounters:  10/25/22 (!) 309 lb (140.2 kg)  10/11/22 (!) 305 lb 9.6 oz (138.6 kg)  08/23/22 297 lb 9.9 oz (135 kg)     GEN:  Well nourished, well developed in no acute distress CARDIAC: Irregular rhythm, no murmurs, rubs, gallops RESPIRATORY:  Clear to auscultation without rales, wheezing or rhonchi       ASSESSMENT:    1. Frequent PVCs   2. Chronic diastolic CHF (congestive heart failure) ([place])   3. Morbid obesity ([place])    PLAN:    In order of problems listed above:   #Frequent PVCs Thankfully not leading to reduced LV function.  Her morbid obesity prevents ablation  candidacy.  Recommend continuing Coreg 25 mg by mouth twice daily.  #Chronic diastolic heart failure NYHA class II.  Warm and dry on exam.  Continue medical therapy and follow-up with Dr. Rockey Situ.  #Morbid obesity Weight loss encouraged.  Follow-up with EP APP in 1 year.  Echo prior to that appointment.      Signed, Lars Mage, MD, [facility], [facility] 10/25/2022 9:54 AM    Electrophysiology [facility]

## 2022-10-25 ENCOUNTER — Encounter: Payer: Self-pay | Admitting: Cardiology

## 2022-10-25 ENCOUNTER — Ambulatory Visit (HOSPITAL_BASED_OUTPATIENT_CLINIC_OR_DEPARTMENT_OTHER): Payer: Managed Care, Other (non HMO) | Attending: Cardiology | Admitting: Cardiovascular Disease

## 2022-10-25 ENCOUNTER — Ambulatory Visit: Payer: Managed Care, Other (non HMO) | Attending: Cardiology | Admitting: Cardiology

## 2022-10-25 VITALS — Ht 65.0 in | Wt 309.0 lb

## 2022-10-25 VITALS — BP 128/86 | HR 90 | Ht 65.0 in | Wt 309.0 lb

## 2022-10-25 DIAGNOSIS — I10 Essential (primary) hypertension: Secondary | ICD-10-CM | POA: Diagnosis not present

## 2022-10-25 DIAGNOSIS — G4733 Obstructive sleep apnea (adult) (pediatric): Secondary | ICD-10-CM | POA: Diagnosis not present

## 2022-10-25 DIAGNOSIS — I5032 Chronic diastolic (congestive) heart failure: Secondary | ICD-10-CM | POA: Insufficient documentation

## 2022-10-25 DIAGNOSIS — I493 Ventricular premature depolarization: Secondary | ICD-10-CM | POA: Diagnosis not present

## 2022-10-25 NOTE — Patient Instructions (Signed)
Medication Instructions:  Your physician recommends that you continue on your current medications as directed. Please refer to the Current Medication list given to you today.  *If you need a refill on your cardiac medications before your next appointment, please call your pharmacy*  Follow-Up: At [facility], you and your health needs are our priority.  As part of our continuing mission to provide you with exceptional heart care, we have created designated Provider Care Teams.  These Care Teams include your primary Cardiologist (physician) and Advanced Practice Providers (APPs -  Physician Assistants and Nurse Practitioners) who all work together to provide you with the care you need, when you need it.  Your next appointment:   1 year(s)  Provider:   Mamie Levers, NP

## 2022-10-30 ENCOUNTER — Encounter: Payer: Self-pay | Admitting: Intensive Care

## 2022-10-30 ENCOUNTER — Emergency Department
Admission: EM | Admit: 2022-10-30 | Discharge: 2022-10-30 | Disposition: A | Discharge status: Home / Self Care | Payer: Managed Care, Other (non HMO) | Attending: Emergency Medicine | Admitting: Emergency Medicine

## 2022-10-30 ENCOUNTER — Other Ambulatory Visit: Payer: Self-pay

## 2022-10-30 ENCOUNTER — Emergency Department: Payer: Managed Care, Other (non HMO)

## 2022-10-30 DIAGNOSIS — I509 Heart failure, unspecified: Secondary | ICD-10-CM | POA: Diagnosis not present

## 2022-10-30 DIAGNOSIS — Z1152 Encounter for screening for COVID-19: Secondary | ICD-10-CM | POA: Diagnosis not present

## 2022-10-30 DIAGNOSIS — R0602 Shortness of breath: Secondary | ICD-10-CM | POA: Diagnosis not present

## 2022-10-30 DIAGNOSIS — I110 Hypertensive heart disease with heart failure: Secondary | ICD-10-CM | POA: Insufficient documentation

## 2022-10-30 DIAGNOSIS — R079 Chest pain, unspecified: Secondary | ICD-10-CM | POA: Diagnosis present

## 2022-10-30 LAB — CBC WITH DIFFERENTIAL/PLATELET
Abs Immature Granulocytes: 0.03 10*3/uL (ref 0.00–0.07)
Basophils Absolute: 0.1 10*3/uL (ref 0.0–0.1)
Basophils Relative: 1 %
Eosinophils Absolute: 0.2 10*3/uL (ref 0.0–0.5)
Eosinophils Relative: 2 %
HCT: 43.3 % (ref 36.0–46.0)
Hemoglobin: 13.7 g/dL (ref 12.0–15.0)
Immature Granulocytes: 0 %
Lymphocytes Relative: 24 %
Lymphs Abs: 2.3 10*3/uL (ref 0.7–4.0)
MCH: 28.4 pg (ref 26.0–34.0)
MCHC: 31.6 g/dL (ref 30.0–36.0)
MCV: 89.6 fL (ref 80.0–100.0)
Monocytes Absolute: 0.5 10*3/uL (ref 0.1–1.0)
Monocytes Relative: 6 %
Neutro Abs: 6.6 10*3/uL (ref 1.7–7.7)
Neutrophils Relative %: 67 %
Platelets: 290 10*3/uL (ref 150–400)
RBC: 4.83 MIL/uL (ref 3.87–5.11)
RDW: 14.3 % (ref 11.5–15.5)
WBC: 9.6 10*3/uL (ref 4.0–10.5)
nRBC: 0 % (ref 0.0–0.2)

## 2022-10-30 LAB — COMPREHENSIVE METABOLIC PANEL
ALT: 21 U/L (ref 0–44)
AST: 22 U/L (ref 15–41)
Albumin: 3.4 g/dL — ABNORMAL LOW (ref 3.5–5.0)
Alkaline Phosphatase: 111 U/L (ref 38–126)
Anion gap: 9 (ref 5–15)
BUN: 13 mg/dL (ref 6–20)
CO2: 26 mmol/L (ref 22–32)
Calcium: 8.6 mg/dL — ABNORMAL LOW (ref 8.9–10.3)
Chloride: 103 mmol/L (ref 98–111)
Creatinine, Ser: 0.95 mg/dL (ref 0.44–1.00)
GFR, Estimated: >60 mL/min (ref >60)
Glucose, Bld: 144 mg/dL — ABNORMAL HIGH (ref 70–99)
Potassium: 3.5 mmol/L (ref 3.5–5.1)
Sodium: 138 mmol/L (ref 135–145)
Total Bilirubin: 0.7 mg/dL (ref 0.3–1.2)
Total Protein: 7.4 g/dL (ref 6.5–8.1)

## 2022-10-30 LAB — BRAIN NATRIURETIC PEPTIDE: B Natriuretic Peptide: 580 pg/mL — ABNORMAL HIGH (ref 0.0–100.0)

## 2022-10-30 LAB — TROPONIN I (HIGH SENSITIVITY)
Troponin I (High Sensitivity): 7 ng/L (ref <18)
Troponin I (High Sensitivity): 7 ng/L (ref <18)

## 2022-10-30 LAB — SARS CORONAVIRUS 2 BY RT PCR: SARS Coronavirus 2 by RT PCR: NEGATIVE

## 2022-10-30 LAB — D-DIMER, QUANTITATIVE: D-Dimer, Quant: 0.5 ug/mL-FEU (ref 0.00–0.50)

## 2022-10-30 LAB — MAGNESIUM: Magnesium: 2 mg/dL (ref 1.7–2.4)

## 2022-10-30 MED ORDER — ACETAMINOPHEN 500 MG PO TABS
1000.0000 mg | ORAL_TABLET | Freq: Once | ORAL | Status: AC
  Administered 2022-10-30: 1000 mg via ORAL
  Filled 2022-10-30: qty 2

## 2022-10-30 NOTE — ED Notes (Signed)
U/s at bedside

## 2022-10-30 NOTE — Discharge Instructions (Addendum)
Your marker of fluid was elevated and so I wonder if this could be related to your CHF.  Your CT scan was reassuring as below.   you should use the extra dose of Lasix tomorrow if you continue to feel short of breath and then hold off and follow-up with the heart failure clinic and return to the ER if you develop worsening symptoms fevers or any other concerns  1. No acute findings. Lungs essentially clear with no evidence of  interstitial lung disease.  2. 1.6 cm right thyroid nodule. Recommend thyroid US (Ref: J Am Coll  Radiol. 2015 Feb;12(2): 143-50).

## 2022-10-30 NOTE — ED Triage Notes (Signed)
Arrived by EMS from home. C/o chest pain that started around 6:15am. Increased over time and experiencing sob.   EMS administered 3, 81mg  aspirins and pt reports taking 81mg  before EMS arrival  Hx hypertension and diabetic  Diagnosed with slight onset CHF recently  EMS vitals:  160/90 b/p 108HR 99% RA 152 CBG

## 2022-10-30 NOTE — ED Provider Notes (Addendum)
[facility] Provider Note    Event Date/Time   First MD Initiated Contact with Patient 10/30/22 [phone]     (approximate)   History   Chest Pain   HPI  Heather Norman is a 47 y.o. female with history of HFpEF, diabetes, hypertension, hyperlipidemia who comes in with chest pain and shortness of breath.  Patient reports some increasing swelling in her bilateral legs but more on the left thigh than the right.  She reports some increasing shortness of breath with intermittent chest pain that started at 615 this morning.  She denies any history of stents.  She reports being compliant with her Lasix and she actually took a additional dose of Lasix this morning of 80 mg.  However she typically takes 60 in the morning and 40 at nighttime.  She denies any falls, hitting her head, any abdominal pain.  Patient was given full dose aspirin  I reviewed patient's visit from 10/25/2022 where she was seen by Dr. Quentin Ore and was noted to have multiple PVCs-patient is currently on Coreg 25 mg twice daily.  Patient was seen on 3/27 and thought to look euvolemic on examination.   Physical Exam   Triage Vital Signs: ED Triage Vitals  Enc Vitals Group     BP --      Pulse Rate 10/30/22 0815 76     Resp 10/30/22 0815 (!) 24     Temp --      Temp src --      SpO2 10/30/22 0815 99 %     Weight 10/30/22 0816 (!) 308 lb 10.3 oz (140 kg)     Height 10/30/22 0816 5\' 5"  (1.651 m)     Head Circumference --      Peak Flow --      Pain Score 10/30/22 0816 6     Pain Loc --      Pain Edu? --      Excl. in [place]? --     Most recent vital signs: Vitals:   10/30/22 0815  Pulse: 76  Resp: (!) 24  SpO2: 99%     General: Awake, no distress.  CV:  Good peripheral perfusion.  Resp:  Normal effort.  Abd:  No distention.  Other:  Trace edema noted bilaterally   ED Results / Procedures / Treatments   Labs (all labs ordered are listed, but only abnormal results are displayed) Labs  Reviewed  CBC WITH DIFFERENTIAL/PLATELET  COMPREHENSIVE METABOLIC PANEL  D-DIMER, QUANTITATIVE  MAGNESIUM  TROPONIN I (HIGH SENSITIVITY)     EKG  My interpretation of EKG:  Sinus tachycardia rate of 100 without any ST elevation or T wave inversions but does have an occasional PVC  RADIOLOGY I have reviewed the xray personally and interpreted and there is some nonspecific interstitial prominence bilaterally  PROCEDURES:  Critical Care performed: No  .1-3 Lead EKG Interpretation  Performed by: Vanessa Burleigh, MD Authorized by: Vanessa Dunlap, MD     Interpretation: normal     ECG rate:  70   ECG rate assessment: normal     Rhythm: sinus rhythm     Ectopy: PVCs     Conduction: normal   Ultrasound ED Peripheral IV (Provider)  Date/Time: 10/30/2022 3:11 PM  Performed by: Vanessa [place], MD Authorized by: Vanessa [place], MD   Procedure details:    Indications: hydration     Skin Prep: chlorhexidine gluconate     Location:  Right forearm  Angiocath:  20 G   Bedside Ultrasound Guided: Yes     Images: not archived     Patient tolerated procedure without complications: Yes     Dressing applied: Yes      MEDICATIONS ORDERED IN ED: Medications  acetaminophen (TYLENOL) tablet 1,000 mg (1,000 mg Oral Given 10/30/22 1205)     IMPRESSION / MDM / ASSESSMENT AND PLAN / ED COURSE  I reviewed the triage vital signs and the nursing notes.   Patient's presentation is most consistent with acute presentation with potential threat to life or bodily function.   Patient comes in with shortness of breath concern for left leg greater than right leg although on examination I do not see any obvious difference I will order ultrasound to rule out DVT blood work to order to evaluate for CHF, COVID, ACS.  COVID test was negative.  CBC reassuring CMP reassuring.  Initial troponin negative D-dimer negative  Discussed with patient her x-ray results about possible interstitial fibrosis given that  she does report a history of being seen recently by cardiology being told that her for heart function is actually pretty reassuring and there was some concern that this may not actually be a CHF exacerbation we discussed doing a CT scan with or without contrast given negative D-dimer low Wells scores patient would like to avoid contrast and will do a CT without contrast  And without contrast is reassuring no significant edema patient does report that she is urinated multiple times since being here after the increased dose of Lasix so I suspect that this could have been some edema that has been resolving.  Her BNP did just result so that showed significant elevation from her baseline so we discussed that this is probably related to CHF.  Discussed doing the higher Lasix for 1 day and following up with the heart failure clinic and she feels comfortable with this plan.  Considered admission but given no hypoxia patient feels comfortable with discharge home after repeat troponin is negative-  Her incidental findings on CT of thyroid nodule were discussed with patient  Her repeat troponin was negative.  Considered admission but given negative workup with negative troponins patient feels comfortable with discharge home will follow-up with the heart failure clinic  The patient is on the cardiac monitor to evaluate for evidence of arrhythmia and/or significant heart rate changes.      FINAL CLINICAL IMPRESSION(S) / ED DIAGNOSES   Final diagnoses:  Acute on chronic congestive heart failure, unspecified heart failure type     Rx / DC Orders   ED Discharge Orders          Ordered    AMB referral to CHF clinic        10/30/22 1205             Note:  This document was prepared using Dragon voice recognition software and may include unintentional dictation errors.   Vanessa [place], MD 10/30/22 1256    Vanessa [place], MD 10/30/22 [phone]

## 2022-11-05 NOTE — Progress Notes (Unsigned)
Patient ID: Heather Norman, female    DOB: 06/09/1976, 47 y.o.   MRN: [id number]  Primary cardiologist: PCP: HF provider:  HPI:  Heather Norman is a 47 y/o female with a history of arthritis, DM, hyperlipidemia, HTN, morbid obesity and chronic heart failure.  Echo 01/26/22: EF of 55-60% along with mild LVH and trivial MR. Echo 02/10/21: EF 50 to 55%. The left ventricle has low normal function. The left ventricle has no regional wall motion abnormalities. Left ventricular diastolic parameters are consistent with Grade I diastolic dysfunction (impaired relaxation).  Was in the ED 10/30/22 due to CP & SOB. Lasix increased. Was in the ED 08/23/22 due to vertigo. Was in the ED 06/07/22 due to SOB and abdominal bloating. Oral diuretic increased and she was released.   She presents today for a post ED visit with a chief complaint of   Past Medical History:  Diagnosis Date   (HFpEF) heart failure with preserved ejection fraction    a. 04/2017 Echo: EF 55-60%, no rwma, Mild MR, mildly dil LA. Nl RV fxn; b. 06/2019 Echo: EF 60-65%, mod LVH. Gr2 DD. Mildly dil LA.   Arthritis    kness/hands - no meds   CHF (congestive heart failure)    Diabetes mellitus without complication    DM (diabetes mellitus) 05/11/2017   Dyspnea on exertion    Headache(784.0)    otc med prn   Heart murmur    Hyperlipidemia    no meds since pregnancy   Hypertension    Morbid obesity    Past Surgical History:  Procedure Laterality Date   CERVICAL CERCLAGE  06/01/2011   Procedure: CERCLAGE CERVICAL;  Surgeon: Philip Aspen, DO;  Location: [facility];  Service: Gynecology;  Laterality: N/A;  REQUESTING PORTABLE ULTARSOUND AT BEDSIDE PT'S EDC:12/02/2011   CERVICAL CERCLAGE  06/01/2011   Procedure: CERCLAGE CERVICAL;  Surgeon: Philip Aspen, DO;  Location: [facility];  Service: Gynecology;  Laterality: N/A;  REQUESTING PORTABLE ULTARSOUND AT BEDSIDE PT'S EDC:12/02/2011   endosocpy     svd     x 2   tab     x 1   TUBAL LIGATION      Family History  Problem Relation Age of Onset   Diabetes Mother    Hypertension Mother    Hyperlipidemia Mother    Hypertension Father    Breast cancer Maternal Grandmother 18   Social History   Tobacco Use   Smoking status: Never   Smokeless tobacco: Never  Substance Use Topics   Alcohol use: No   Allergies  Allergen Reactions   Penicillins Anaphylaxis    Has patient had a PCN reaction causing immediate rash, facial/tongue/throat swelling, SOB or lightheadedness with hypotension: Yes Has patient had a PCN reaction causing severe rash involving mucus membranes or skin necrosis: No Has patient had a PCN reaction that required hospitalization: No Has patient had a PCN reaction occurring within the last 10 years: No If all of the above answers are "NO", then may proceed with Cephalosporin use.    Review of Systems  Constitutional:  Positive for fatigue (baseline). Negative for appetite change.  HENT:  Negative for congestion, postnasal drip and sore throat.   Eyes: Negative.   Respiratory:  Positive for cough (dry) and shortness of breath. Negative for chest tightness and wheezing.   Cardiovascular:  Negative for chest pain, palpitations and leg swelling.  Gastrointestinal:  Positive for abdominal distention (feel bloated although better). Negative for abdominal pain.  Endocrine: Negative.  Genitourinary: Negative.   Musculoskeletal:  Positive for arthralgias (right arm pain at times; left heel/foot), back pain and neck pain.  Skin:  Negative for wound.  Allergic/Immunologic: Negative.   Neurological:  Positive for headaches. Negative for dizziness, syncope, light-headedness and numbness.  Hematological:  Negative for adenopathy. Does not bruise/bleed easily.  Psychiatric/Behavioral:  Positive for sleep disturbance (not sleeping well; sleeping on 2 pillows). Negative for dysphoric mood. The patient is not nervous/anxious.      Physical Exam Vitals and nursing note  reviewed.  Constitutional:      General: She is not in acute distress.    Appearance: She is well-developed. She is obese.  HENT:     Head: Normocephalic and atraumatic.  Neck:     Vascular: No JVD.  Cardiovascular:     Rate and Rhythm: Normal rate and regular rhythm.     Heart sounds: Normal heart sounds. No murmur heard.    No gallop.  Pulmonary:     Effort: Pulmonary effort is normal. No respiratory distress.     Breath sounds: Normal breath sounds. No wheezing or rales.  Abdominal:     Palpations: Abdomen is soft.     Tenderness: There is no abdominal tenderness.  Musculoskeletal:     Cervical back: Normal range of motion and neck supple.     Right lower leg: No tenderness. Edema (1+ pitting) present.     Left lower leg: Edema (1+ pitting) present.  Skin:    General: Skin is warm and dry.  Neurological:     General: No focal deficit present.     Mental Status: She is alert and oriented to person, place, and time.  Psychiatric:        Mood and Affect: Mood normal.        Behavior: Behavior normal.    Assessment & Plan:   1: Acute on Chronic heart failure with preserved ejection fraction with structural changes (mild LVH)- - NYHA class III - minimally fluid overloaded today although stable - weighing daily; reminded to call for an overnight weight gain of >2 pounds or a weekly weight gain of >5 pounds - weight 305.6 pounds since last visit here 1 month ago  - not adding salt to her food and is trying to follow a low sodium diet - saw [facility] provider (Bensimhon) 10/11/22 - saw cardiology Mariah Milling) 02/06/22 - on GDMT of carvedilol, entresto, jardiance and spironolactone.  - again emphasized the importance of getting daily fluid intake down to 60-64 ounces; currently she's drinking twice that amount as she says that her mouth stays dry "all the time"; encouraged her to use hard candy to suck on - BNP 10/30/22 was 580.0  2: HTN- - BP  - sees PCP [facility])   - BMP from 10/30/22 reviewed and showed sodium 138, potassium 3.5, creatinine 0.95 and GFR >60  3: PVC's- - saw EP Lalla Brothers)  10/25/22 - has worn Zio monitor in the past which showed predominantly NSR with frequent PVC's  4: Snoring- - sleep study 10/25/22

## 2022-11-06 ENCOUNTER — Ambulatory Visit: Payer: Managed Care, Other (non HMO) | Attending: Family | Admitting: Family

## 2022-11-06 ENCOUNTER — Encounter: Payer: Self-pay | Admitting: Family

## 2022-11-06 VITALS — BP 128/69 | HR 67 | Wt 308.2 lb

## 2022-11-06 DIAGNOSIS — I110 Hypertensive heart disease with heart failure: Secondary | ICD-10-CM | POA: Insufficient documentation

## 2022-11-06 DIAGNOSIS — I428 Other cardiomyopathies: Secondary | ICD-10-CM | POA: Diagnosis not present

## 2022-11-06 DIAGNOSIS — M1990 Unspecified osteoarthritis, unspecified site: Secondary | ICD-10-CM | POA: Insufficient documentation

## 2022-11-06 DIAGNOSIS — R0683 Snoring: Secondary | ICD-10-CM | POA: Diagnosis not present

## 2022-11-06 DIAGNOSIS — I10 Essential (primary) hypertension: Secondary | ICD-10-CM | POA: Diagnosis not present

## 2022-11-06 DIAGNOSIS — Z79899 Other long term (current) drug therapy: Secondary | ICD-10-CM | POA: Insufficient documentation

## 2022-11-06 DIAGNOSIS — E119 Type 2 diabetes mellitus without complications: Secondary | ICD-10-CM | POA: Diagnosis not present

## 2022-11-06 DIAGNOSIS — I5032 Chronic diastolic (congestive) heart failure: Secondary | ICD-10-CM | POA: Diagnosis not present

## 2022-11-06 DIAGNOSIS — I493 Ventricular premature depolarization: Secondary | ICD-10-CM | POA: Diagnosis not present

## 2022-11-06 DIAGNOSIS — E785 Hyperlipidemia, unspecified: Secondary | ICD-10-CM | POA: Diagnosis not present

## 2022-11-06 DIAGNOSIS — Z833 Family history of diabetes mellitus: Secondary | ICD-10-CM | POA: Diagnosis not present

## 2022-11-06 MED ORDER — SEMAGLUTIDE(0.25 OR 0.5MG/DOS) 2 MG/3ML ~~LOC~~ SOPN: 0.2500 mg | PEN_INJECTOR | SUBCUTANEOUS | 5 refills | Status: DC

## 2022-11-06 NOTE — Patient Instructions (Addendum)
Start ozempic weekly   Call Dr. Windell Hummingbird office to schedule a follow-up appt.

## 2022-11-10 ENCOUNTER — Encounter (HOSPITAL_BASED_OUTPATIENT_CLINIC_OR_DEPARTMENT_OTHER): Payer: Self-pay | Admitting: Cardiovascular Disease

## 2022-11-10 NOTE — Procedures (Signed)
Patient Name: Heather Norman, Heather Norman Date: 10/25/2022 Gender: Female D.O.B: 1975/12/08 Age (years): 56 Referring Provider: Armanda Magic MD, ABSM Height (inches): 65 Interpreting Physician: Nicki Guadalajara MD, ABSM Weight (lbs): 309 RPSGT: Shelah Lewandowsky BMI: 51 MRN: [id number] Neck Size: 16.50  CLINICAL INFORMATION The patient is referred for a CPAP titration to treat sleep apnea.  Date of WatchPat HST: 07/24/2022: Severe OSA with AHI 35.4/h; O2 nadir 75%; possible atrial fibrillation  SLEEP STUDY TECHNIQUE As per the AASM Manual for the Scoring of Sleep and Associated Events v2.3 (April 2016) with a hypopnea requiring 4% desaturations.  The channels recorded and monitored were frontal, central and occipital EEG, electrooculogram (EOG), submentalis EMG (chin), nasal and oral airflow, thoracic and abdominal wall motion, anterior tibialis EMG, snore microphone, electrocardiogram, and pulse oximetry. Continuous positive airway pressure (CPAP) was initiated at the beginning of the study and titrated to treat sleep-disordered breathing.  MEDICATIONS albuterol (VENTOLIN HFA) 108 (90 Base) MCG/ACT inhaler aspirin EC 81 MG tablet atorvastatin (LIPITOR) 20 MG tablet carvedilol (COREG) 25 MG tablet JARDIANCE 10 MG TABS tablet latanoprost (XALATAN) 0.005 % ophthalmic solution loratadine (CLARITIN) 10 MG tablet meclizine (ANTIVERT) 25 MG tablet ondansetron (ZOFRAN-ODT) 4 MG disintegrating tablet oxybutynin (DITROPAN) 5 MG tablet pantoprazole (PROTONIX) 40 MG tablet potassium chloride 20 MEQ/15ML (10%) SOLN sacubitril-valsartan (ENTRESTO) 97-103 MG Semaglutide,0.25 or 0.5MG /DOS, 2 MG/3ML SOPN spironolactone (ALDACTONE) 25 MG tablet torsemide (DEMADEX) 20 MG tablet Medications self-administered by patient taken the night of the study : ATORVASTATIN, CARVEDILOL, Entresto, Jardiance, OXYBUTYNIN CHLORIDE, TORSEMIDE  TECHNICIAN COMMENTS Comments added by technician: NONE Comments  added by scorer: N/A  RESPIRATORY PARAMETERS Optimal PAP Pressure (cm): 15 AHI at Optimal Pressure (/hr): 0.9 Overall Minimal O2 (%): 89.0 Supine % at Optimal Pressure (%): 41 Minimal O2 at Optimal Pressure (%): 89.0   SLEEP ARCHITECTURE The study was initiated at 11:11:05 PM and ended at 5:30:22 AM.  Sleep onset time was 1.7 minutes and the sleep efficiency was 85.0%. The total sleep time was 322.5 minutes.  The patient spent 7.1% of the night in stage N1 sleep, 73.3% in stage N2 sleep, 0.0% in stage N3 and 19.5% in REM.Stage REM latency was 126.5 minutes  Wake after sleep onset was 55.1. Alpha intrusion was absent. Supine sleep was 28.84%.  CARDIAC DATA The 2 lead EKG demonstrated sinus rhythm. The mean heart rate was 78.6 beats per minute. Other EKG findings include: PVCs.  LEG MOVEMENT DATA The total Periodic Limb Movements of Sleep (PLMS) were 0. The PLMS index was 0.0. A PLMS index of <15 is considered normal in adults.  IMPRESSIONS - CPAP was initiated at 5 cm and was titrated to 15 cm of water ( AHI 0.9/h, O2 nadir 89%). - Mild oxygen desaturations to a nadir of 89%. - The patient snored with moderate snoring volume during this titration study. - 2-lead EKG demonstrated: frequent PVCs - Snoring resolved at 15 cm of water. - Clinically significant periodic limb movements were not noted during this study. Arousals associated with PLMs were rare.  DIAGNOSIS - Obstructive Sleep Apnea (G47.33)  RECOMMENDATIONS - Recommend an initial trial of CPAP Auto therapy with EPR of 3 at 14 - 20 cm H2O with a Large size Fisher&Paykel Full Face Evora Full mask and heated humidification. - Effort should be made to optimize nasal and oropharyngeal patency. - Avoid alcohol, sedatives and other CNS depressants that may worsen sleep apnea and disrupt normal sleep architecture. - Sleep hygiene should be reviewed to assess factors that  may improve sleep quality. - Weight management (BMI 51) and  regular exercise should be initiated or continued. - Recommend a download and sleep clinic evaluation after 4 -6 weeks of therapy  [Electronically signed] 11/10/2022 04:58 PM  Nicki Guadalajara MD, [facility], ABSM Diplomate, American Board of Sleep Medicine  NPI: [id number]  [facility] PH: [phone]   FX: [phone] ACCREDITED BY THE AMERICAN ACADEMY OF SLEEP MEDICINE

## 2022-11-20 ENCOUNTER — Telehealth: Payer: Self-pay | Admitting: *Deleted

## 2022-11-20 NOTE — Telephone Encounter (Signed)
Message left sleep study has been completed. CPAP device will be ordered. Return a call if questions. Otherwise no return call needed. Order for CPAP device has been sent to [facility] via [employer] portal.

## 2022-11-27 ENCOUNTER — Other Ambulatory Visit: Payer: Self-pay | Admitting: Nurse Practitioner

## 2022-11-27 DIAGNOSIS — Z1231 Encounter for screening mammogram for malignant neoplasm of breast: Secondary | ICD-10-CM

## 2022-12-18 ENCOUNTER — Other Ambulatory Visit: Payer: Self-pay | Admitting: Family

## 2022-12-19 ENCOUNTER — Other Ambulatory Visit: Payer: Self-pay

## 2022-12-19 MED ORDER — ENTRESTO 97-103 MG PO TABS: 1.0000 | ORAL_TABLET | Freq: Two times a day (BID) | ORAL | 3 refills | Status: DC

## 2022-12-19 NOTE — Telephone Encounter (Signed)
Patient called to get [facility] refilled. Patient stated that insurance would only cover 90 day supplies as opposed to the 30 day

## 2022-12-21 LAB — COLOGUARD

## 2022-12-26 ENCOUNTER — Other Ambulatory Visit: Payer: Self-pay

## 2022-12-26 DIAGNOSIS — I5032 Chronic diastolic (congestive) heart failure: Secondary | ICD-10-CM

## 2022-12-26 MED ORDER — ATORVASTATIN CALCIUM 20 MG PO TABS
ORAL_TABLET | ORAL | 3 refills | Status: AC
Start: 2022-12-26 — End: ?

## 2022-12-26 NOTE — Telephone Encounter (Signed)
Patient called to refill Atorvastatin. Refill was placed.

## 2022-12-31 NOTE — Progress Notes (Signed)
ADVANCED HF CLINIC NOTE  Referring Provider: Clarisa Kindred, NP Primary Care: Center, [facility] Primary Cardiologist: Julien Nordmann, MD   HPI:  [name] is a 47 y/o female with a history of arthritis, DM, hyperlipidemia, HTN, morbid obesity and chronic heart failure.   Echo report from 01/26/22 reviewed and showed an EF of 55-60% along with mild LVH and trivial MR. Echo report from 02/10/21 Left ventricular ejection fraction, by estimation, is 50 to 55%. The left ventricle has low normal function. The left ventricle has no regional wall motion abnormalities. Left ventricular diastolic parameters are consistent with Grade I diastolic dysfunction (impaired relaxation).  Zio 10/21 12.5% PVCs Saw EP Lalla Brothers) and suggested possible AAD (flecainide or propafenone) if developed symptoms or LV dysfunction.    Was in the ED 06/07/22 due to SOB and abdominal bloating. Oral diuretic increased and she was released.   Sleep study 07/24/22: AHI 35.4 Sats down to 75%. Has not started CPAP yet. Possible AF during study lasting 4.5 hours. Zio placed    I saw her in 1/24 for first time. Entresto increased due to elevated BP.  Zio patch 1/24  - Sinus rhythm. No AF - 44 runs NSVT (longest 8 beats)  - 21.3% PVCs (3 morphologies 9.7%, 7.3%, 4.3%)  Saw Dr. Lalla Brothers in 3/24. felt not to be ablation candidate due to obesity. Recommended continue carvedilol as EF stable  Saw Dr. Tresa Endo for CPAP titration 4/24. CPAP report shows well controlled OSA  Seen in ER 10/30/22 for CP. Hstrop, ECG and d-Dimer all normal. BNP elevated 47 -> 480 so torsemide increased. .    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 Feels more SOB and bloated. Weight unchanged. LE edema under good control. Taking torsemide 60/40. Occasionally takes 80/40. Applewatch says consistently ~ 50% AF    Past Medical History:  Diagnosis Date   (HFpEF) heart failure with preserved ejection fraction (HCC)    a. 04/2017 Echo:  EF 55-60%, no rwma, Mild MR, mildly dil LA. Nl RV fxn; b. 06/2019 Echo: EF 60-65%, mod LVH. Gr2 DD. Mildly dil LA.   Arthritis    kness/hands - no meds   CHF (congestive heart failure) (HCC)    Diabetes mellitus without complication (HCC)    DM (diabetes mellitus) (HCC) 05/11/2017   Dyspnea on exertion    Headache(784.0)    otc med prn   Heart murmur    Hyperlipidemia    no meds since pregnancy   Hypertension    Morbid obesity (HCC)     Current Outpatient Medications  Medication Sig Dispense Refill   albuterol (VENTOLIN HFA) 108 (90 Base) MCG/ACT inhaler Inhale 1-2 puffs into the lungs every 6 (six) hours as needed. 18 g 0   aspirin EC 81 MG tablet Take 81 mg by mouth daily.     atorvastatin (LIPITOR) 20 MG tablet TAKE 1 TABLET(20 MG) BY MOUTH DAILY 90 tablet 3   carvedilol (COREG) 25 MG tablet TAKE 1 TABLET BY MOUTH IN THE MORNING AND 1 AND 1/2 TABLETS IN THE EVENING 225 tablet 3   Cholecalciferol (VITAMIN D3) 1.25 MG (50000 UT) CAPS Take by mouth daily.     JARDIANCE 10 MG TABS tablet Take 10 mg by mouth daily. Pt taking 25 MG     latanoprost (XALATAN) 0.005 % ophthalmic solution Place 1 drop into both eyes at bedtime.     loratadine (CLARITIN) 10 MG tablet Take 10 mg by mouth daily. Takes daily  oxybutynin (DITROPAN) 5 MG tablet Take 5 mg by mouth 2 (two) times daily.     pantoprazole (PROTONIX) 40 MG tablet Take 40 mg by mouth daily.     potassium chloride 20 MEQ/15ML (10%) SOLN Take 90 mLs (120 mEq total) by mouth daily. 1800 mL 5   sacubitril-valsartan (ENTRESTO) 97-103 MG Take 1 tablet by mouth 2 (two) times daily. 90 tablet 3   spironolactone (ALDACTONE) 25 MG tablet TAKE 1 TABLET(25 MG) BY MOUTH DAILY 90 tablet 3   torsemide (DEMADEX) 20 MG tablet TAKE 3 TABLETS(60 MG) BY MOUTH QAM and 40mg  QPM 450 tablet 3   meclizine (ANTIVERT) 25 MG tablet Take 1 tablet (25 mg total) by mouth 3 (three) times daily as needed for dizziness or nausea. (Patient not taking: Reported on  01/01/2023) 30 tablet 1   ondansetron (ZOFRAN-ODT) 4 MG disintegrating tablet Take 1 tablet (4 mg total) by mouth every 8 (eight) hours as needed for nausea or vomiting. (Patient not taking: Reported on 01/01/2023) 20 tablet 0   Semaglutide,0.25 or 0.5MG /DOS, 2 MG/3ML SOPN Inject 0.25 mg into the skin once a week. (Patient not taking: Reported on 01/01/2023) 3 mL 5   No current facility-administered medications for this visit.    Allergies  Allergen Reactions   Penicillins Anaphylaxis    Has patient had a PCN reaction causing immediate rash, facial/tongue/throat swelling, SOB or lightheadedness with hypotension: Yes Has patient had a PCN reaction causing severe rash involving mucus membranes or skin necrosis: No Has patient had a PCN reaction that required hospitalization: No Has patient had a PCN reaction occurring within the last 10 years: No If all of the above answers are "NO", then may proceed with Cephalosporin use.      Social History   Socioeconomic History   Marital status: Single    Spouse name: Not on file   Number of children: 3   Years of education: 12   Highest education level: 12th grade  Occupational History   Not on file  Tobacco Use   Smoking status: Never   Smokeless tobacco: Never  Vaping Use   Vaping Use: Never used  Substance and Sexual Activity   Alcohol use: No   Drug use: No   Sexual activity: Yes    Birth control/protection: Surgical  Other Topics Concern   Not on file  Social History Narrative   Not on file   Social Determinants of Health   Financial Resource Strain: Medium Risk (08/13/2017)   Overall Financial Resource Strain (CARDIA)    Difficulty of Paying Living Expenses: Somewhat hard  Food Insecurity: Food Insecurity Present (08/13/2017)   Hunger Vital Sign    Worried About Running Out of Food in the Last Year: Often true    Ran Out of Food in the Last Year: Sometimes true  Transportation Needs: No Transportation Needs (08/13/2017)    PRAPARE - Administrator, Civil Service (Medical): No    Lack of Transportation (Non-Medical): No  Physical Activity: Sufficiently Active (06/09/2019)   Exercise Vital Sign    Days of Exercise per Week: 5 days    Minutes of Exercise per Session: 150+ min  Stress: Stress Concern Present (08/13/2017)   [employer] of Occupational Health - Occupational Stress Questionnaire    Feeling of Stress : Rather much  Social Connections: Moderately Isolated (08/13/2017)   Social Connection and Isolation Panel [NHANES]    Frequency of Communication with Friends and Family: More than three times a week  Frequency of Social Gatherings with Friends and Family: Never    Attends Religious Services: Never    Database administrator or Organizations: No    Attends Banker Meetings: Never    Marital Status: Never married  Intimate Partner Violence: Not At Risk (06/09/2019)   Humiliation, Afraid, Rape, and Kick questionnaire    Fear of Current or Ex-Partner: No    Emotionally Abused: No    Physically Abused: No    Sexually Abused: No      Family History  Problem Relation Age of Onset   Diabetes Mother    Hypertension Mother    Hyperlipidemia Mother    Hypertension Father    Breast cancer Maternal Grandmother 56    Vitals:   01/01/23 1111  BP: (!) 109/54  Pulse: (!) 57  SpO2: 96%  Weight: (!) 309 lb (140.2 kg)  Height: 5\' 5"  (1.651 m)     Wt Readings from Last 3 Encounters:  01/01/23 (!) 309 lb (140.2 kg)  11/06/22 (!) 308 lb 3.2 oz (139.8 kg)  10/30/22 (!) 308 lb 10.3 oz (140 kg)    PHYSICAL EXAM: General:  Obese. Well appearing. No resp difficulty HEENT: normal Neck: supple. JVP hard to see. Carotids 2+ bilat; no bruits. No lymphadenopathy or thryomegaly appreciated. Cor: PMI nondisplaced. Irregular rate & rhythm. No rubs, gallops or murmurs. Lungs: clear Abdomen: obese soft, nontender, nondistended. No bruits or masses. Good bowel sounds. Extremities:  no cyanosis, clubbing, rash, edema Neuro: alert & orientedx3, cranial nerves grossly intact. moves all 4 extremities w/o difficulty. 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 BNP 47 -> 580 - NYHA II-III - Volume status hard to assess on exam but markedly elevated by ReDS and recent BNP high.  - Continue Jardiance 10  - Increase torsemide 80/40  - Continue spiro 25 daily - Continue Entresto 97/103 bid - Continues to have frequent PVCs. I worry EF may be dropping - Will start mexilitene for PVC suppression  - Will need close f/u In HF Clinic with Ms. Hackney. If ReDS not coming down would suggest weekly metolazone 2.5 with Kcl 40  - Check labs today - Will repeat echo    2: HTN - Blood pressure well controlled. Continue current regimen.   3: PVC's, frequent - Zio 10/21 12.5% PVCs Saw EP Lalla Brothers) and suggested possible AAD (flecainide or propafenone) if developed symptoms or LV dysfunction.  - Zio patch 1/24. Sinus rhythm. No AF. 44 runs NSVT (longest 8 beats). 21.3% PVCs (3 morphologies 9.7%, 7.3%, 4.3%) - suspect driven by OSA but could aslo be infiltrative process (? Sarcoid) -> Check cMRI - Saw Dr. Lalla Brothers 3/24 felt not to be ablation candidate due to obesity. Recommended continue carvedilol as EF stable - PVCs not improving with CPAP yet - Start mexilitene 200 bid   4: Possible AF  - noted on sleep study and AppleWatch - No AF on Zio -> suspect this is likely PVCs  5. OSA, severe - Sleep study 07/24/22: AHI 35.4 Sats down to 75%. Has not started CPAP yet. - discussed role of weight loss - She has seen Dr. Tresa Endo for CPAP optimization. Download reviewed with her today. AHI now < 5  6.  Morbid obesity - has been on  Ozempic in past without success - ? Trial of terzepitide  Arvilla Meres, MD  11:51 AM

## 2023-01-01 ENCOUNTER — Ambulatory Visit (HOSPITAL_BASED_OUTPATIENT_CLINIC_OR_DEPARTMENT_OTHER): Payer: Managed Care, Other (non HMO) | Admitting: Internal Medicine

## 2023-01-01 ENCOUNTER — Other Ambulatory Visit
Admission: RE | Admit: 2023-01-01 | Discharge: 2023-01-01 | Disposition: A | Discharge status: Home / Self Care | Payer: Managed Care, Other (non HMO) | Source: Ambulatory Visit | Attending: Internal Medicine | Admitting: Internal Medicine

## 2023-01-01 VITALS — BP 109/54 | HR 57 | Ht 65.0 in | Wt 309.0 lb

## 2023-01-01 DIAGNOSIS — I5032 Chronic diastolic (congestive) heart failure: Secondary | ICD-10-CM

## 2023-01-01 DIAGNOSIS — I493 Ventricular premature depolarization: Secondary | ICD-10-CM | POA: Diagnosis not present

## 2023-01-01 LAB — BASIC METABOLIC PANEL
Anion gap: 10 (ref 5–15)
BUN: 16 mg/dL (ref 6–20)
CO2: 25 mmol/L (ref 22–32)
Calcium: 8.6 mg/dL — ABNORMAL LOW (ref 8.9–10.3)
Chloride: 104 mmol/L (ref 98–111)
Creatinine, Ser: 1.13 mg/dL — ABNORMAL HIGH (ref 0.44–1.00)
GFR, Estimated: >60 mL/min (ref >60)
Glucose, Bld: 121 mg/dL — ABNORMAL HIGH (ref 70–99)
Potassium: 3.6 mmol/L (ref 3.5–5.1)
Sodium: 139 mmol/L (ref 135–145)

## 2023-01-01 LAB — BRAIN NATRIURETIC PEPTIDE: B Natriuretic Peptide: 291.5 pg/mL — ABNORMAL HIGH (ref 0.0–100.0)

## 2023-01-01 LAB — MAGNESIUM: Magnesium: 2.2 mg/dL (ref 1.7–2.4)

## 2023-01-01 MED ORDER — TORSEMIDE 20 MG PO TABS: ORAL_TABLET | ORAL | 6 refills | Status: DC

## 2023-01-01 MED ORDER — MEXILETINE HCL 200 MG PO CAPS: 200.0000 mg | ORAL_CAPSULE | Freq: Two times a day (BID) | ORAL | 3 refills | Status: DC

## 2023-01-01 NOTE — Patient Instructions (Signed)
Medication Changes:  Start taking Mexiletine 200 mg (1 tablet) two times a day.  Increase your Torsemide to 80 mg (4 tablets) in the am and 40 mg  (2 tablets) at night.  Lab Work:  Labs done today, your results will be available in MyChart, we will contact you for abnormal readings.   Testing/Procedures:  Do the following things EVERYDAY: Weigh yourself in the morning before breakfast. Write it down and keep it in a log. Take your medicines as prescribed Eat low salt foods--Limit salt (sodium) to 2000 mg per day.  Stay as active as you can everyday Limit all fluids for the day to less than 2 liters    Special Instructions // Education:  Do the following things EVERYDAY: Weigh yourself in the morning before breakfast. Write it down and keep it in a log. Take your medicines as prescribed Eat low salt foods--Limit salt (sodium) to 2000 mg per day.  Stay as active as you can everyday Limit all fluids for the day to less than 2 liters   Follow-Up in: follow up in 2 weeks with Clarisa Kindred , FNP. And follow up in 2 months with Dr. Gala Romney.    If you have any questions or concerns before your next appointment please send [place] a message through [place] or call our office at [phone] Monday-Friday 8 am-5 pm.   If you have an urgent need after hours on the weekend please call your Primary Cardiologist or the Advanced [facility] in [place] at [phone].

## 2023-01-01 NOTE — Progress Notes (Signed)
REDS VEST READING= 53 CHEST RULER= 35  VEST FITTING TASKS: HEIGHT MARKER=B  COMMENTS:

## 2023-01-05 ENCOUNTER — Encounter: Payer: Self-pay | Admitting: Emergency Medicine

## 2023-01-05 ENCOUNTER — Emergency Department
Admission: EM | Admit: 2023-01-05 | Discharge: 2023-01-05 | Disposition: A | Discharge status: Home / Self Care | Payer: Managed Care, Other (non HMO) | Attending: Emergency Medicine | Admitting: Emergency Medicine

## 2023-01-05 ENCOUNTER — Other Ambulatory Visit: Payer: Self-pay

## 2023-01-05 ENCOUNTER — Emergency Department: Payer: Managed Care, Other (non HMO)

## 2023-01-05 DIAGNOSIS — I5032 Chronic diastolic (congestive) heart failure: Secondary | ICD-10-CM | POA: Insufficient documentation

## 2023-01-05 DIAGNOSIS — I509 Heart failure, unspecified: Secondary | ICD-10-CM | POA: Insufficient documentation

## 2023-01-05 DIAGNOSIS — K219 Gastro-esophageal reflux disease without esophagitis: Secondary | ICD-10-CM | POA: Diagnosis not present

## 2023-01-05 DIAGNOSIS — R0789 Other chest pain: Secondary | ICD-10-CM | POA: Diagnosis present

## 2023-01-05 DIAGNOSIS — E119 Type 2 diabetes mellitus without complications: Secondary | ICD-10-CM | POA: Diagnosis not present

## 2023-01-05 DIAGNOSIS — D72829 Elevated white blood cell count, unspecified: Secondary | ICD-10-CM | POA: Insufficient documentation

## 2023-01-05 DIAGNOSIS — I110 Hypertensive heart disease with heart failure: Secondary | ICD-10-CM | POA: Insufficient documentation

## 2023-01-05 DIAGNOSIS — R079 Chest pain, unspecified: Secondary | ICD-10-CM

## 2023-01-05 DIAGNOSIS — Z79899 Other long term (current) drug therapy: Secondary | ICD-10-CM | POA: Diagnosis not present

## 2023-01-05 DIAGNOSIS — Z7984 Long term (current) use of oral hypoglycemic drugs: Secondary | ICD-10-CM | POA: Diagnosis not present

## 2023-01-05 DIAGNOSIS — Z7982 Long term (current) use of aspirin: Secondary | ICD-10-CM | POA: Diagnosis not present

## 2023-01-05 DIAGNOSIS — N179 Acute kidney failure, unspecified: Secondary | ICD-10-CM | POA: Insufficient documentation

## 2023-01-05 LAB — CBC WITH DIFFERENTIAL/PLATELET
Abs Immature Granulocytes: 0.05 10*3/uL (ref 0.00–0.07)
Basophils Absolute: 0.1 10*3/uL (ref 0.0–0.1)
Basophils Relative: 1 %
Eosinophils Absolute: 0.1 10*3/uL (ref 0.0–0.5)
Eosinophils Relative: 1 %
HCT: 44.4 % (ref 36.0–46.0)
Hemoglobin: 14.3 g/dL (ref 12.0–15.0)
Immature Granulocytes: 0 %
Lymphocytes Relative: 32 %
Lymphs Abs: 4.1 10*3/uL — ABNORMAL HIGH (ref 0.7–4.0)
MCH: 28.7 pg (ref 26.0–34.0)
MCHC: 32.2 g/dL (ref 30.0–36.0)
MCV: 89.2 fL (ref 80.0–100.0)
Monocytes Absolute: 0.9 10*3/uL (ref 0.1–1.0)
Monocytes Relative: 7 %
Neutro Abs: 7.5 10*3/uL (ref 1.7–7.7)
Neutrophils Relative %: 59 %
Platelets: 297 10*3/uL (ref 150–400)
RBC: 4.98 MIL/uL (ref 3.87–5.11)
RDW: 14.6 % (ref 11.5–15.5)
WBC: 12.7 10*3/uL — ABNORMAL HIGH (ref 4.0–10.5)
nRBC: 0 % (ref 0.0–0.2)

## 2023-01-05 LAB — COMPREHENSIVE METABOLIC PANEL
ALT: 23 U/L (ref 0–44)
AST: 22 U/L (ref 15–41)
Albumin: 3.4 g/dL — ABNORMAL LOW (ref 3.5–5.0)
Alkaline Phosphatase: 112 U/L (ref 38–126)
Anion gap: 10 (ref 5–15)
BUN: 18 mg/dL (ref 6–20)
CO2: 24 mmol/L (ref 22–32)
Calcium: 8.6 mg/dL — ABNORMAL LOW (ref 8.9–10.3)
Chloride: 102 mmol/L (ref 98–111)
Creatinine, Ser: 1.16 mg/dL — ABNORMAL HIGH (ref 0.44–1.00)
GFR, Estimated: 58 mL/min — ABNORMAL LOW (ref >60)
Glucose, Bld: 141 mg/dL — ABNORMAL HIGH (ref 70–99)
Potassium: 3.8 mmol/L (ref 3.5–5.1)
Sodium: 136 mmol/L (ref 135–145)
Total Bilirubin: 0.9 mg/dL (ref 0.3–1.2)
Total Protein: 7.3 g/dL (ref 6.5–8.1)

## 2023-01-05 LAB — TROPONIN I (HIGH SENSITIVITY)
Troponin I (High Sensitivity): 5 ng/L (ref <18)
Troponin I (High Sensitivity): 5 ng/L (ref <18)

## 2023-01-05 LAB — LIPASE, BLOOD: Lipase: 38 U/L (ref 11–51)

## 2023-01-05 LAB — BRAIN NATRIURETIC PEPTIDE: B Natriuretic Peptide: 248.6 pg/mL — ABNORMAL HIGH (ref 0.0–100.0)

## 2023-01-05 MED ORDER — ALUM & MAG HYDROXIDE-SIMETH 200-200-20 MG/5ML PO SUSP
30.0000 mL | Freq: Once | ORAL | Status: AC
  Administered 2023-01-05: 30 mL via ORAL
  Filled 2023-01-05: qty 30

## 2023-01-05 MED ORDER — FAMOTIDINE IN NACL 20-0.9 MG/50ML-% IV SOLN
20.0000 mg | Freq: Once | INTRAVENOUS | Status: AC
  Administered 2023-01-05: 20 mg via INTRAVENOUS
  Filled 2023-01-05: qty 50

## 2023-01-05 MED ORDER — SUCRALFATE 1 GM/10ML PO SUSP: 1.0000 g | Freq: Four times a day (QID) | ORAL | 1 refills | Status: DC

## 2023-01-05 MED ORDER — LIDOCAINE VISCOUS HCL 2 % MT SOLN
15.0000 mL | Freq: Once | OROMUCOSAL | Status: AC
  Administered 2023-01-05: 15 mL via OROMUCOSAL
  Filled 2023-01-05: qty 15

## 2023-01-05 NOTE — ED Provider Notes (Signed)
[facility] Provider Note    Event Date/Time   First MD Initiated Contact with Patient 01/05/23 [phone]     (approximate)   History   Chest Pain   HPI  Heather Norman is a 47 y.o. female who presents to the ED from home with a chief complaint of chest burning, gassiness and burping while she was going to sleep tonight.  Patient with a history of CHF who recently had an increase of her torsemide and started a new medicine for PVCs which she took her first dose yesterday.  States she is normally [place] but tonight she is gassier than usual.  Economist for dinner.  Denies fever/chills, cough, shortness of breath, abdominal pain, nausea, vomiting or dizziness.     Past Medical History   Past Medical History:  Diagnosis Date   (HFpEF) heart failure with preserved ejection fraction (HCC)    a. 04/2017 Echo: EF 55-60%, no rwma, Mild MR, mildly dil LA. Nl RV fxn; b. 06/2019 Echo: EF 60-65%, mod LVH. Gr2 DD. Mildly dil LA.   Arthritis    kness/hands - no meds   CHF (congestive heart failure) (HCC)    Diabetes mellitus without complication (HCC)    DM (diabetes mellitus) (HCC) 05/11/2017   Dyspnea on exertion    Headache(784.0)    otc med prn   Heart murmur    Hyperlipidemia    no meds since pregnancy   Hypertension    Morbid obesity [facility])      Active Problem List   Patient Active Problem List   Diagnosis Date Noted   OSA (obstructive sleep apnea) 10/25/2022   Numbness and tingling 06/20/2021   Difficulty sleeping 06/20/2021   Blurred vision 06/20/2021   Type 2 diabetes mellitus without complication, without long-term current use of insulin (HCC) 05/19/2018   DM (diabetes mellitus) (HCC) 05/11/2017   Snoring 05/11/2017   Morbid obesity (HCC) 08/30/2015   Chronic diastolic CHF (congestive heart failure) (HCC) 08/30/2015   Essential hypertension 02/16/2015   Elderly multigravida with antepartum condition or complication 05/22/2011     Past  Surgical History   Past Surgical History:  Procedure Laterality Date   CERVICAL CERCLAGE  06/01/2011   Procedure: CERCLAGE CERVICAL;  Surgeon: Philip Aspen, DO;  Location: [facility];  Service: Gynecology;  Laterality: N/A;  REQUESTING PORTABLE ULTARSOUND AT BEDSIDE PT'S EDC:12/02/2011   CERVICAL CERCLAGE  06/01/2011   Procedure: CERCLAGE CERVICAL;  Surgeon: Philip Aspen, DO;  Location: [facility];  Service: Gynecology;  Laterality: N/A;  REQUESTING PORTABLE ULTARSOUND AT BEDSIDE PT'S EDC:12/02/2011   endosocpy     svd     x 2   tab     x 1   TUBAL LIGATION       Home Medications   Prior to Admission medications   Medication Sig Start Date End Date Taking? Authorizing Provider  sucralfate (CARAFATE) 1 GM/10ML suspension Take 10 mLs (1 g total) by mouth 4 (four) times daily. 01/05/23  Yes Irean Hong, MD  albuterol (VENTOLIN HFA) 108 (90 Base) MCG/ACT inhaler Inhale 1-2 puffs into the lungs every 6 (six) hours as needed. 07/20/22   Mickie Bail, NP  aspirin EC 81 MG tablet Take 81 mg by mouth daily.    [provider]  atorvastatin (LIPITOR) 20 MG tablet TAKE 1 TABLET(20 MG) BY MOUTH DAILY 12/26/22   [name], FNP  carvedilol (COREG) 25 MG tablet TAKE 1 TABLET BY MOUTH IN THE MORNING AND  1 AND 1/2 TABLETS IN THE EVENING 03/23/22   [name], MD  Cholecalciferol (VITAMIN D3) 1.25 MG (50000 UT) CAPS Take by mouth daily.    [provider]  JARDIANCE 10 MG TABS tablet Take 10 mg by mouth daily. Pt taking 25 MG 10/10/19   [provider]  latanoprost (XALATAN) 0.005 % ophthalmic solution Place 1 drop into both eyes at bedtime. 01/17/22   [provider]  loratadine (CLARITIN) 10 MG tablet Take 10 mg by mouth daily. Takes daily    [provider]  meclizine (ANTIVERT) 25 MG tablet Take 1 tablet (25 mg total) by mouth 3 (three) times daily as needed for dizziness or nausea. Patient not taking: Reported on 01/01/2023 08/23/22   Sharman Cheek, MD  mexiletine (MEXITIL) 200 MG capsule Take 1 capsule (200 mg total) by mouth 2 (two) times daily. 01/01/23   Bensimhon, Bevelyn Buckles, MD  ondansetron (ZOFRAN-ODT) 4 MG disintegrating tablet Take 1 tablet (4 mg total) by mouth every 8 (eight) hours as needed for nausea or vomiting. Patient not taking: Reported on 01/01/2023 08/23/22   Sharman Cheek, MD  oxybutynin (DITROPAN) 5 MG tablet Take 5 mg by mouth 2 (two) times daily. 12/25/17   [provider]  pantoprazole (PROTONIX) 40 MG tablet Take 40 mg by mouth daily. 01/27/21   [provider]  potassium chloride 20 MEQ/15ML (10%) SOLN Take 90 mLs (120 mEq total) by mouth daily. 06/16/22   Delma Freeze, FNP  sacubitril-valsartan (ENTRESTO) 97-103 MG Take 1 tablet by mouth 2 (two) times daily. 12/19/22   Delma Freeze, FNP  Semaglutide,0.25 or 0.5MG /DOS, 2 MG/3ML SOPN Inject 0.25 mg into the skin once a week. Patient not taking: Reported on 01/01/2023 11/06/22   Delma Freeze, FNP  spironolactone (ALDACTONE) 25 MG tablet TAKE 1 TABLET(25 MG) BY MOUTH DAILY 04/13/22   [name], MD  torsemide (DEMADEX) 20 MG tablet TAKE 4 TABLETS(80 MG) BY MOUTH QAM and 40mg  QPM 01/01/23   Bensimhon, Bevelyn Buckles, MD     Allergies  Penicillins   Family History   Family History  Problem Relation Age of Onset   Diabetes Mother    Hypertension Mother    Hyperlipidemia Mother    Hypertension Father    Breast cancer Maternal Grandmother 81     Physical Exam  Triage Vital Signs: ED Triage Vitals  Enc Vitals Group     BP 01/05/23 0229 124/67     Pulse Rate 01/05/23 0229 98     Resp 01/05/23 0229 18     Temp 01/05/23 0229 98.6 F (37 C)     Temp src --      SpO2 01/05/23 0229 98 %     Weight 01/05/23 0225 (!) 309 lb (140.2 kg)     Height 01/05/23 0225 5\' 5"  (1.651 m)     Head Circumference --      Peak Flow --      Pain Score 01/05/23 0225 8     Pain Loc --      Pain Edu? --      Excl. in GC? --     Updated Vital  Signs: BP 108/66   Pulse 77   Temp 98.6 F (37 C)   Resp (!) 21   Ht 5\' 5"  (1.651 m)   Wt (!) 140.2 kg   LMP 08/15/2021   SpO2 99%   BMI 51.42 kg/m    General: Awake, no distress.  CV:  RRR.  Good peripheral perfusion.  Resp:  Normal effort.  CTAB.  Anterior chest wall tender to palpation. Abd:  Nontender.  No distention.  Other:  No pedal edema.   ED Results / Procedures / Treatments  Labs (all labs ordered are listed, but only abnormal results are displayed) Labs Reviewed  COMPREHENSIVE METABOLIC PANEL - Abnormal; Notable for the following components:      Result Value   Glucose, Bld 141 (*)    Creatinine, Ser 1.16 (*)    Calcium 8.6 (*)    Albumin 3.4 (*)    GFR, Estimated 58 (*)    All other components within normal limits  CBC WITH DIFFERENTIAL/PLATELET - Abnormal; Notable for the following components:   WBC 12.7 (*)    Lymphs Abs 4.1 (*)    All other components within normal limits  BRAIN NATRIURETIC PEPTIDE - Abnormal; Notable for the following components:   B Natriuretic Peptide 248.6 (*)    All other components within normal limits  LIPASE, BLOOD  TROPONIN I (HIGH SENSITIVITY)  TROPONIN I (HIGH SENSITIVITY)     EKG  ED ECG REPORT I, , J, the attending physician, personally viewed and interpreted this ECG.   Date: 01/05/2023  EKG Time: 0224  Rate: 95  Rhythm: normal sinus rhythm  Axis: Normal  Intervals: PVCs  ST&T Change: Nonspecific    RADIOLOGY I have independently visualized and interpreted patient's x-ray as well as noted the radiology interpretation:  X-ray: Vascular congestion without overt edema  Official radiology report(s): DG Chest 2 View  Result Date: 01/05/2023 CLINICAL DATA:  Chest pain EXAM: CHEST - 2 VIEW COMPARISON:  10/30/2022 FINDINGS: Cardiac shadow is stable. Vascular congestion is again seen. No significant edema is noted. No focal infiltrate is seen. No bony abnormality is noted. IMPRESSION: Vascular congestion  without significant edema. Electronically Signed   By: Alcide Clever M.D.   On: 01/05/2023 02:57     PROCEDURES:  Critical Care performed: No  .1-3 Lead EKG Interpretation  Performed by: Irean Hong, MD Authorized by: Irean Hong, MD     Interpretation: normal     ECG rate:  90   ECG rate assessment: normal     Rhythm: sinus rhythm     Ectopy: none     Conduction: normal   Comments:     Patient placed on cardiac monitor to evaluate for arrhythmias    MEDICATIONS ORDERED IN ED: Medications  alum & mag hydroxide-simeth (MAALOX/MYLANTA) 200-200-20 MG/5ML suspension 30 mL (30 mLs Oral Given 01/05/23 0507)  lidocaine (XYLOCAINE) 2 % viscous mouth solution 15 mL (15 mLs Mouth/Throat Given 01/05/23 0506)  famotidine (PEPCID) IVPB 20 mg premix (20 mg Intravenous New Bag/Given 01/05/23 0516)     IMPRESSION / MDM / ASSESSMENT AND PLAN / ED COURSE  I reviewed the triage vital signs and the nursing notes.                             47 year old female presenting with chest burning. Differential diagnosis includes, but is not limited to, ACS, aortic dissection, pulmonary embolism, cardiac tamponade, pneumothorax, pneumonia, pericarditis, myocarditis, GI-related causes including esophagitis/gastritis, and musculoskeletal chest wall pain.   I personally reviewed patient's records and note her cardiology office visit on 01/01/2023.  It is noted that her torsemide was increased to 4 tablets every morning and 2 tablets every evening.  She was started on Mexiletine for PVCs.  Patient's  presentation is most consistent with acute presentation with potential threat to life or bodily function.  The patient is on the cardiac monitor to evaluate for evidence of arrhythmia and/or significant heart rate changes.  Laboratory results demonstrate mild leukocytosis WBC 12.7, mild AKI creatinine 1.16, mild elevation of BNP at 248, troponin negative.  Chest x-ray demonstrates pulmonary vascular congestion without  overt edema.  Will check lipase, repeat troponin.  Administer GI cocktail, IV Pepcid and reassess.  Clinical Course as of 01/05/23 0627  Fri [name] 05, 2023  1610 Patient feeling better.  Updated her on negative troponin and lipase.  She is taking Protonix daily.  Will add Carafate and patient will follow-up closely with her PCP.  Strict return precautions given.  Patient verbalizes understanding and agrees with plan of care. [JS]    Clinical Course User Index [JS] Irean Hong, MD     FINAL CLINICAL IMPRESSION(S) / ED DIAGNOSES   Final diagnoses:  Nonspecific chest pain  Gastroesophageal reflux disease, unspecified whether esophagitis present     Rx / DC Orders   ED Discharge Orders          Ordered    sucralfate (CARAFATE) 1 GM/10ML suspension  4 times daily        01/05/23 9604             Note:  This document was prepared using Dragon voice recognition software and may include unintentional dictation errors.   Irean Hong, MD 01/05/23 [phone]

## 2023-01-05 NOTE — ED Triage Notes (Signed)
Pt presents ambulatory to triage via POV with complaints of central CP that started when the patient was going to sleep tonight. She notes laying down when the "sharp pain" began - rating it 8/10. No pain meds taken PTA. A&Ox4 at this time. Denies SOB.

## 2023-01-05 NOTE — Discharge Instructions (Addendum)
Add Carafate 3 times daily with meals and at bedtime to the Protonix you are already taking.  Return to the ER for worsening symptoms, persistent vomiting, difficulty breathing or other concerns.

## 2023-01-22 ENCOUNTER — Ambulatory Visit
Admission: RE | Admit: 2023-01-22 | Discharge: 2023-01-22 | Disposition: A | Discharge status: Home / Self Care | Payer: Managed Care, Other (non HMO) | Source: Ambulatory Visit | Attending: Nurse Practitioner | Admitting: Nurse Practitioner

## 2023-01-22 DIAGNOSIS — Z1231 Encounter for screening mammogram for malignant neoplasm of breast: Secondary | ICD-10-CM

## 2023-01-25 ENCOUNTER — Telehealth: Payer: Self-pay

## 2023-01-25 ENCOUNTER — Encounter: Payer: Self-pay | Admitting: Family

## 2023-01-25 ENCOUNTER — Ambulatory Visit: Payer: Managed Care, Other (non HMO) | Admitting: Family

## 2023-01-25 ENCOUNTER — Other Ambulatory Visit
Admission: RE | Admit: 2023-01-25 | Discharge: 2023-01-25 | Disposition: A | Discharge status: Home / Self Care | Payer: Managed Care, Other (non HMO) | Source: Ambulatory Visit | Attending: Family | Admitting: Family

## 2023-01-25 VITALS — BP 124/71 | HR 75 | Wt 307.0 lb

## 2023-01-25 DIAGNOSIS — I10 Essential (primary) hypertension: Secondary | ICD-10-CM | POA: Diagnosis not present

## 2023-01-25 DIAGNOSIS — G4733 Obstructive sleep apnea (adult) (pediatric): Secondary | ICD-10-CM | POA: Diagnosis not present

## 2023-01-25 DIAGNOSIS — I493 Ventricular premature depolarization: Secondary | ICD-10-CM

## 2023-01-25 DIAGNOSIS — E119 Type 2 diabetes mellitus without complications: Secondary | ICD-10-CM

## 2023-01-25 DIAGNOSIS — I5032 Chronic diastolic (congestive) heart failure: Secondary | ICD-10-CM | POA: Diagnosis present

## 2023-01-25 LAB — BASIC METABOLIC PANEL
Anion gap: 7 (ref 5–15)
BUN: 18 mg/dL (ref 6–20)
CO2: 28 mmol/L (ref 22–32)
Calcium: 8.3 mg/dL — ABNORMAL LOW (ref 8.9–10.3)
Chloride: 104 mmol/L (ref 98–111)
Creatinine, Ser: 1.28 mg/dL — ABNORMAL HIGH (ref 0.44–1.00)
GFR, Estimated: 52 mL/min — ABNORMAL LOW (ref >60)
Glucose, Bld: 140 mg/dL — ABNORMAL HIGH (ref 70–99)
Potassium: 3.4 mmol/L — ABNORMAL LOW (ref 3.5–5.1)
Sodium: 139 mmol/L (ref 135–145)

## 2023-01-25 MED ORDER — EMPAGLIFLOZIN 25 MG PO TABS
25.0000 mg | ORAL_TABLET | Freq: Every day | ORAL | 6 refills | Status: AC
Start: 2023-01-25 — End: ?

## 2023-01-25 MED ORDER — POTASSIUM CHLORIDE 20 MEQ/15ML (10%) PO SOLN: 60.0000 meq | Freq: Every day | ORAL | 5 refills | Status: DC

## 2023-01-25 MED ORDER — METOLAZONE 2.5 MG PO TABS: 2.5000 mg | ORAL_TABLET | ORAL | 5 refills | Status: DC

## 2023-01-25 NOTE — Progress Notes (Signed)
   01/25/23 1034  ReDS Vest / Clip  BMI (Calculated) 51.09  Station Marker B  Ruler Value 35  ReDS Value Range (!) > 40  ReDS Actual Value 45

## 2023-01-25 NOTE — Progress Notes (Signed)
PCP: [facility] (last seen 04/24) Primary Cardiologist:Timothy Mariah Milling, MD (last seen 07/23; returns 07/24) EP: Azalia Bilis, MD (last seen 03/24) HF provider: Arvilla Meres, MD (last seen 06/24)   HPI:  Heather Norman is a 47 y/o female with a history of arthritis, DM, hyperlipidemia, HTN, morbid obesity, sleep apnea, PVC's and chronic heart failure.  Echo 01/26/22: EF of 55-60% along with mild LVH and trivial MR. Echo 02/10/21: EF 50 to 55%. The left ventricle has low normal function. The left ventricle has no regional wall motion abnormalities. Left ventricular diastolic parameters are consistent with Grade I diastolic dysfunction (impaired relaxation).  Zio 10/21 12.5% PVCs Saw EP Lalla Brothers) and suggested possible AAD (flecainide or propafenone) if developed symptoms or LV dysfunction.   Zio patch 1/24  - Sinus rhythm. No AF - 44 runs NSVT (longest 8 beats)  - 21.3% PVCs (3 morphologies 9.7%, 7.3%, 4.3%)  Sleep study 07/24/22: AHI 35.4 Sats down to 75%. Has not started CPAP yet. Possible AF during study lasting 4.5 hours  Was in the ED 01/05/23 due to chest burning, gassiness and burping while she was going to sleep tonight. Recently started on mexiletine for PVCs. Given GI cocktail with improvement of sympotsm  Was in the ED 10/30/22 due to CP & SOB. Lasix increased for a couple of days. Was in the ED 08/23/22 due to vertigo. Was in the ED 06/07/22 due to SOB and abdominal bloating. Oral diuretic increased and she was released.   She presents today for a HF f/u visit with a chief complaint of moderate SOB with minimal exertion. Chronic in nature and feels like it might be a little better since torsemide was increased. Has associated fatigue, palpitations with acid reflux, dizziness and left foot pain along with this. Says that she notices the palpitations when her acid reflux is bad. Denies cough, chest pain, abdominal distention or difficulty sleeping  Wearing CPAP nightly for  at least 5 hours with event rate of 0.5. Says that sleeping with her CPAP is the "best sleep every".    At last visit, AM torsemide was increased to 80mg . PM dose continued at 40mg  and feels like her SOB has improved "a little". Has upcoming cMRI.   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   ROS: All systems negative except as listed in HPI, PMH and Problem List.  SH:  Social History   Socioeconomic History   Marital status: Single    Spouse name: Not on file   Number of children: 3   Years of education: 12   Highest education level: 12th grade  Occupational History   Not on file  Tobacco Use   Smoking status: Never   Smokeless tobacco: Never  Vaping Use   Vaping Use: Never used  Substance and Sexual Activity   Alcohol use: No   Drug use: No   Sexual activity: Yes    Birth control/protection: Surgical  Other Topics Concern   Not on file  Social History Narrative   Not on file   Social Determinants of Health   Financial Resource Strain: Medium Risk (08/13/2017)   Overall Financial Resource Strain (CARDIA)    Difficulty of Paying Living Expenses: Somewhat hard  Food Insecurity: Food Insecurity Present (08/13/2017)   Hunger Vital Sign    Worried About Running Out of Food in the Last Year: Often true    Ran Out of Food in the Last Year: Sometimes true  Transportation Needs: No Transportation Needs (08/13/2017)  PRAPARE - Administrator, Civil Service (Medical): No    Lack of Transportation (Non-Medical): No  Physical Activity: Sufficiently Active (06/09/2019)   Exercise Vital Sign    Days of Exercise per Week: 5 days    Minutes of Exercise per Session: 150+ min  Stress: Stress Concern Present (08/13/2017)   Heather Norman of Occupational Health - Occupational Stress Questionnaire    Feeling of Stress : Rather much  Social Connections: Moderately Isolated (08/13/2017)   Social Connection and Isolation Panel [NHANES]    Frequency of Communication with  Friends and Family: More than three times a week    Frequency of Social Gatherings with Friends and Family: Never    Attends Religious Services: Never    Database administrator or Organizations: No    Attends Banker Meetings: Never    Marital Status: Never married  Intimate Partner Violence: Not At Risk (06/09/2019)   Humiliation, Afraid, Rape, and Kick questionnaire    Fear of Current or Ex-Partner: No    Emotionally Abused: No    Physically Abused: No    Sexually Abused: No    FH:  Family History  Problem Relation Age of Onset   Diabetes Mother    Hypertension Mother    Hyperlipidemia Mother    Hypertension Father    Breast cancer Maternal Grandmother 55    Past Medical History:  Diagnosis Date   (HFpEF) heart failure with preserved ejection fraction (HCC)    a. 04/2017 Echo: EF 55-60%, no rwma, Mild MR, mildly dil LA. Nl RV fxn; b. 06/2019 Echo: EF 60-65%, mod LVH. Gr2 DD. Mildly dil LA.   Arthritis    kness/hands - no meds   CHF (congestive heart failure) (HCC)    Diabetes mellitus without complication (HCC)    DM (diabetes mellitus) (HCC) 05/11/2017   Dyspnea on exertion    Headache(784.0)    otc med prn   Heart murmur    Hyperlipidemia    no meds since pregnancy   Hypertension    Morbid obesity (HCC)     Current Outpatient Medications  Medication Sig Dispense Refill   albuterol (VENTOLIN HFA) 108 (90 Base) MCG/ACT inhaler Inhale 1-2 puffs into the lungs every 6 (six) hours as needed. 18 g 0   aspirin EC 81 MG tablet Take 81 mg by mouth daily.     atorvastatin (LIPITOR) 20 MG tablet TAKE 1 TABLET(20 MG) BY MOUTH DAILY 90 tablet 3   carvedilol (COREG) 25 MG tablet TAKE 1 TABLET BY MOUTH IN THE MORNING AND 1 AND 1/2 TABLETS IN THE EVENING 225 tablet 3   Cholecalciferol (VITAMIN D3) 1.25 MG (50000 UT) CAPS Take by mouth daily.     JARDIANCE 10 MG TABS tablet Take 10 mg by mouth daily. Pt taking 25 MG     latanoprost (XALATAN) 0.005 % ophthalmic  solution Place 1 drop into both eyes at bedtime.     loratadine (CLARITIN) 10 MG tablet Take 10 mg by mouth daily. Takes daily     meclizine (ANTIVERT) 25 MG tablet Take 1 tablet (25 mg total) by mouth 3 (three) times daily as needed for dizziness or nausea. (Patient not taking: Reported on 01/01/2023) 30 tablet 1   mexiletine (MEXITIL) 200 MG capsule Take 1 capsule (200 mg total) by mouth 2 (two) times daily. 180 capsule 3   ondansetron (ZOFRAN-ODT) 4 MG disintegrating tablet Take 1 tablet (4 mg total) by mouth every 8 (eight) hours as needed  for nausea or vomiting. (Patient not taking: Reported on 01/01/2023) 20 tablet 0   oxybutynin (DITROPAN) 5 MG tablet Take 5 mg by mouth 2 (two) times daily.     pantoprazole (PROTONIX) 40 MG tablet Take 40 mg by mouth daily.     potassium chloride 20 MEQ/15ML (10%) SOLN Take 90 mLs (120 mEq total) by mouth daily. 1800 mL 5   sacubitril-valsartan (ENTRESTO) 97-103 MG Take 1 tablet by mouth 2 (two) times daily. 90 tablet 3   Semaglutide,0.25 or 0.5MG /DOS, 2 MG/3ML SOPN Inject 0.25 mg into the skin once a week. (Patient not taking: Reported on 01/01/2023) 3 mL 5   spironolactone (ALDACTONE) 25 MG tablet TAKE 1 TABLET(25 MG) BY MOUTH DAILY 90 tablet 3   sucralfate (CARAFATE) 1 GM/10ML suspension Take 10 mLs (1 g total) by mouth 4 (four) times daily. 420 mL 1   torsemide (DEMADEX) 20 MG tablet TAKE 4 TABLETS(80 MG) BY MOUTH QAM and 40mg  QPM 180 tablet 6   No current facility-administered medications for this visit.   Vitals:   01/25/23 1034  BP: 124/71  Pulse: 75  SpO2: 98%  Weight: (!) 307 lb (139.3 kg)   Wt Readings from Last 3 Encounters:  01/25/23 (!) 307 lb (139.3 kg)  01/05/23 (!) 309 lb (140.2 kg)  01/01/23 (!) 309 lb (140.2 kg)   Lab Results  Component Value Date   CREATININE 1.16 (H) 01/05/2023   CREATININE 1.13 (H) 01/01/2023   CREATININE 0.95 10/30/2022    PHYSICAL EXAM:  General:  Well appearing. No resp difficulty HEENT: normal Neck:  supple. JVP flat. No lymphadenopathy or thryomegaly appreciated. Cor: PMI normal. Regular rate & rhythm. No rubs, gallops or murmurs. Lungs: clear Abdomen: soft, nontender, nondistended. No hepatosplenomegaly. No bruits or masses.  Extremities: no cyanosis, clubbing, rash, edema Neuro: alert & oriented x3, cranial nerves grossly intact. Moves all 4 extremities w/o difficulty. Affect pleasant.   ECG: not done  Reds: 45% (previous reading was 53%)   ASSESSMENT & PLAN:  1: NICM with preserved ejection fraction- - suspect due to severe OSA/ HTN - NYHA class III - euvolemic today - weighing daily; reminded to call for an overnight weight gain of >2 pounds or a weekly weight gain of >5 pounds - weight down 2 pounds since last visit here 3 weeks ago - echo 01/26/22: EF of 55-60% along with mild LVH and trivial MR. Echo 02/10/21: EF 50 to 55%. The left ventricle has low normal function. The left ventricle has no regional wall motion abnormalities. Left ventricular diastolic parameters are consistent with Grade I diastolic dysfunction (impaired relaxation). - cMRI scheduled for 02/07/23 - not adding salt to her food  - saw [facility] provider (Bensimhon) 06/24 - ReDs today is 45%; 3 weeks ago it was 53% - saw cardiology Mariah Milling) 07/23; returns 07/24 - continue carvedilol 25mg  BID - continue entresto 97/103mg  BID - continue jardiance 25mg  daily (diabetes dose) - continue spironolactone 25mg  daily - continue torsemide 80mg  QM/ 40mg  PM - continue potassium (45ml) daily - begin metolazone 2.5mg  twice weekly with additional potassium on metolazone days; will contact patient after lab results if more potassium will be needed - BMP today and then again on 02/07/23 when she comes for cMRI - trying to limit her fluid intake to closer to 64 ounces - BNP 01/05/23 was 248.6  2: HTN- - BP 124/71 - saw PCP [facility]) 04/24; returns 07/24 - BMP from 01/05/23 reviewed and showed  sodium 136,  potassium 3.8, creatinine 1.16 and GFR 58  3: PVC's- - saw EP Lalla Brothers)  10/25/22 - Zio 10/21 12.5% PVCs Saw EP Lalla Brothers) and suggested possible AAD (flecainide or propafenone) if developed symptoms or LV dysfunction.  Zio patch 1/24    Sinus rhythm. No AF   44 runs NSVT (longest 8 beats)    21.3% PVCs (3 morphologies 9.7%, 7.3%, 4.3%) - morbid obesity prevents ablation - continue mexilitene 200mg  BID  4: Severe OSA- - sleep study done 10/25/22 - has seen Dr Tresa Endo for CPAP optimization - wearing CPAP nightly and reports sleeping "great"; wears it anywhere from 5+ hours up to 12 hours  5: Obesity- - using ozempic 0.25mg  weekly - ?  Terzepitide or increase ozempic  6: DM- - continue jardiance 25mg  daily - continue ozempic right now  Return in 2 months, sooner if needed.

## 2023-01-25 NOTE — Telephone Encounter (Signed)
-----   Message from Electa Sniff, RN sent at 01/25/2023  1:29 PM EDT -----  ----- Message ----- From: Delma Freeze, FNP Sent: 01/25/2023  12:43 PM EDT To: [facility] Triage  Potassium a little low so on the days you take the metolazone (booster fluid pill), you should take an additional potassium instead of the I had told you earlier. BMET on 02/07/23 when you come for you MRI.

## 2023-01-25 NOTE — Patient Instructions (Addendum)
Start metolazone as 1 tablet twice a week. On these 2 days, take an additional of potassium.   On 7/10 when you come for your MRI, also get your lab work done.

## 2023-01-26 ENCOUNTER — Other Ambulatory Visit: Payer: Self-pay | Admitting: Nurse Practitioner

## 2023-01-26 DIAGNOSIS — N6489 Other specified disorders of breast: Secondary | ICD-10-CM

## 2023-01-26 DIAGNOSIS — R928 Other abnormal and inconclusive findings on diagnostic imaging of breast: Secondary | ICD-10-CM

## 2023-01-31 ENCOUNTER — Ambulatory Visit
Admission: RE | Admit: 2023-01-31 | Discharge: 2023-01-31 | Disposition: A | Discharge status: Home / Self Care | Payer: Managed Care, Other (non HMO) | Source: Ambulatory Visit | Attending: Nurse Practitioner | Admitting: Nurse Practitioner

## 2023-01-31 ENCOUNTER — Ambulatory Visit
Admission: RE | Admit: 2023-01-31 | Discharge: 2023-01-31 | Discharge status: Home / Self Care | Payer: Managed Care, Other (non HMO) | Source: Ambulatory Visit | Attending: Nurse Practitioner | Admitting: Nurse Practitioner

## 2023-01-31 DIAGNOSIS — N6489 Other specified disorders of breast: Secondary | ICD-10-CM

## 2023-01-31 DIAGNOSIS — R928 Other abnormal and inconclusive findings on diagnostic imaging of breast: Secondary | ICD-10-CM

## 2023-02-06 ENCOUNTER — Telehealth (HOSPITAL_COMMUNITY): Payer: Self-pay | Admitting: *Deleted

## 2023-02-06 ENCOUNTER — Encounter (HOSPITAL_COMMUNITY): Payer: Self-pay

## 2023-02-06 NOTE — Telephone Encounter (Signed)
Patient calling about upcoming cardiac imaging study; pt verbalizes understanding of appt date/time, parking situation and where to check in, ordered, and verified current allergies; name and call back number provided for further questions should they arise  Larey Brick RN Navigator Cardiac Imaging [facility] [phone] office [phone] cell  Patient reports having a MRI in the past without incident.

## 2023-02-07 ENCOUNTER — Ambulatory Visit: Admission: RE | Admit: 2023-02-07 | Payer: Managed Care, Other (non HMO) | Source: Ambulatory Visit

## 2023-02-07 ENCOUNTER — Encounter: Payer: Self-pay | Admitting: Family

## 2023-02-12 NOTE — Progress Notes (Signed)
Date:  02/19/2023   ID:  Heather Norman, DOB [birth year], MRN [id number]  Patient Location:  [address] [place] [address]   Provider location:   [facility], [employer] office  PCP:  [facility]  Cardiologist:  Mariah Milling, [facility]  Chief Complaint  Patient presents with   Follow-up    12 month follow up.  Patient denies new or acute cardiac problems/concerns today.      History of Present Illness:    Heather Norman is a 47 y.o. 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  last seen by myself in clinic July 2023  Followed by CHF clinic Seen by EP for frequent PVCs, last seen March 2024 Asymptomatic PVCs, not a candidate for catheter ablation  Echocardiogram January 26, 2022 Normal ejection fraction ,greater than 55%  Seen in ER 10/30/22 for CP. Hstrop, ECG and d-Dimer all normal. BNP elevated 47 -> 480 so torsemide increased. .    In the ER 6/24 with chest pain, gassy, burping, Workup negative Has been in by primary care, referred to GI, she has yet to schedule appointment  Feels her symptoms are secondary to GI etiology, worsening GERD  Taking Protonix 40 mg daily Supplementing with lots of Tums  Dry mouth, may be worse since she increased her torsemide and metolazone as below  Completed sleep study, on CPAP Was taking torsemide 60/40 After seeing CHF clinic, was changed to 80/40 and started on metolazone  CR up to 1.8 on higher dose through PMD Up from CR 1.2  Weight 304 At home 306-307  EKG personally reviewed by myself on todays visit EKG Interpretation Date/Time:  Monday February 19 2023 08:16:20 EDT Ventricular Rate:  84 PR Interval:  166 QRS Duration:  90 QT Interval:  384 QTC Calculation: 453 R Axis:   -2  Text  Interpretation: Sinus rhythm with frequent Premature ventricular complexes Nonspecific T wave abnormality When compared with ECG of 05-[name]-2023 02:24, No significant change was found Confirmed by Julien Nordmann [phone]) on 02/19/2023 9:01:23 AM    Other past medical history reviewed event monitor, sum total 15% burden PVCs I sent medic from PVCs  11/2017 ER for hypertensive urgency and volume overload.  This occurred in the setting of dietary indiscretion.  Her weight was 278 pounds that day and at that time she was on Lasix 40 mg twice daily.   Given IV lasix and released   02/2018:ER visit  leg swelling, hypertensive at 175/112.   dietary indiscretions, frequently eating [employer] sandwiches for lunch Given IV lasix and released    Past Medical History:  Diagnosis Date   (HFpEF) heart failure with preserved ejection fraction (HCC)    a. 04/2017 Echo: EF 55-60%, no rwma, Mild MR, mildly dil LA. Nl RV fxn; b. 06/2019 Echo: EF 60-65%, mod LVH. Gr2 DD. Mildly dil LA.   Arthritis    kness/hands - no meds   CHF (congestive heart failure) (HCC)    Diabetes mellitus without complication (HCC)    DM (diabetes mellitus) (HCC) 05/11/2017   Dyspnea on exertion    Headache(784.0)    otc med prn   Heart murmur    Hyperlipidemia    no meds since pregnancy   Hypertension    Morbid obesity (HCC)  Past Surgical History:  Procedure Laterality Date   CERVICAL CERCLAGE  06/01/2011   Procedure: CERCLAGE CERVICAL;  Surgeon: Philip Aspen, DO;  Location: [facility];  Service: Gynecology;  Laterality: N/A;  REQUESTING PORTABLE ULTARSOUND AT BEDSIDE PT'S EDC:12/02/2011   CERVICAL CERCLAGE  06/01/2011   Procedure: CERCLAGE CERVICAL;  Surgeon: Philip Aspen, DO;  Location: [facility];  Service: Gynecology;  Laterality: N/A;  REQUESTING PORTABLE ULTARSOUND AT BEDSIDE PT'S EDC:12/02/2011   endosocpy     svd     x 2   tab     x 1   TUBAL LIGATION       Current Outpatient Medications on File Prior to  Visit  Medication Sig Dispense Refill   albuterol (VENTOLIN HFA) 108 (90 Base) MCG/ACT inhaler Inhale 1-2 puffs into the lungs every 6 (six) hours as needed. 18 g 0   aspirin EC 81 MG tablet Take 81 mg by mouth daily.     atorvastatin (LIPITOR) 20 MG tablet TAKE 1 TABLET(20 MG) BY MOUTH DAILY 90 tablet 3   carvedilol (COREG) 25 MG tablet TAKE 1 TABLET BY MOUTH IN THE MORNING AND 1 AND 1/2 TABLETS IN THE EVENING 225 tablet 3   Cholecalciferol (VITAMIN D3) 1.25 MG (50000 UT) CAPS Take 1 capsule by mouth once a week.     empagliflozin (JARDIANCE) 25 MG TABS tablet Take 1 tablet (25 mg total) by mouth daily before breakfast. 30 tablet 6   latanoprost (XALATAN) 0.005 % ophthalmic solution Place 1 drop into both eyes at bedtime.     loratadine (CLARITIN) 10 MG tablet Take 10 mg by mouth daily. Takes daily     meclizine (ANTIVERT) 25 MG tablet Take 1 tablet (25 mg total) by mouth 3 (three) times daily as needed for dizziness or nausea. 30 tablet 1   metolazone (ZAROXOLYN) 2.5 MG tablet Take 1 tablet (2.5 mg total) by mouth 2 (two) times a week. 8 tablet 5   mexiletine (MEXITIL) 200 MG capsule Take 1 capsule (200 mg total) by mouth 2 (two) times daily. 180 capsule 3   ondansetron (ZOFRAN-ODT) 4 MG disintegrating tablet Take 1 tablet (4 mg total) by mouth every 8 (eight) hours as needed for nausea or vomiting. 20 tablet 0   oxybutynin (DITROPAN) 5 MG tablet Take 5 mg by mouth 2 (two) times daily.     pantoprazole (PROTONIX) 40 MG tablet Take 40 mg by mouth daily.     potassium chloride 20 MEQ/15ML (10%) SOLN Take 45 mLs (60 mEq total) by mouth daily. And additional on the days you take the metolazone (Patient taking differently: Take 60 mEq by mouth daily. And additional on the days you take the metolazone) 1800 mL 5   sacubitril-valsartan (ENTRESTO) 97-103 MG Take 1 tablet by mouth 2 (two) times daily. 90 tablet 3   Semaglutide,0.25 or 0.5MG /DOS, 2 MG/3ML SOPN Inject 0.25 mg into the skin once  a week. 3 mL 5   spironolactone (ALDACTONE) 25 MG tablet TAKE 1 TABLET(25 MG) BY MOUTH DAILY 90 tablet 3   torsemide (DEMADEX) 20 MG tablet TAKE 4 TABLETS(80 MG) BY MOUTH QAM and 40mg  QPM 180 tablet 6   No current facility-administered medications on file prior to visit.    Allergies:   Penicillins   Social History   Tobacco Use   Smoking status: Never   Smokeless tobacco: Never  Vaping Use   Vaping status: Never Used  Substance Use Topics   Alcohol use: No   Drug use:  No      Family Hx: The patient's family history includes Breast cancer (age of onset: 52) in her maternal grandmother; Diabetes in her mother; Hyperlipidemia in her mother; Hypertension in her father and mother.  ROS:   Please see the history of present illness.    Review of Systems  Constitutional: Negative.        Dry mouth  HENT: Negative.    Respiratory: Negative.    Cardiovascular: Negative.   Gastrointestinal: Negative.   Musculoskeletal: Negative.   Neurological: Negative.   Psychiatric/Behavioral: Negative.    All other systems reviewed and are negative.    Labs/Other Tests and Data Reviewed:    Recent Labs: 01/01/2023: Magnesium 2.2 01/05/2023: ALT 23; B Natriuretic Peptide 248.6; Hemoglobin 14.3; Platelets 297 01/25/2023: BUN 18; Creatinine, Ser 1.28; Potassium 3.4; Sodium 139   Recent Lipid Panel Lab Results  Component Value Date/Time   CHOL 219 (H) 06/05/2020 09:14 AM   CHOL 168 01/14/2015 10:51 AM   CHOL 199 12/13/2012 01:06 AM   TRIG 133 06/05/2020 09:14 AM   TRIG 84 12/13/2012 01:06 AM   HDL 48 06/05/2020 09:14 AM   HDL 42 01/14/2015 10:51 AM   HDL 52 12/13/2012 01:06 AM   CHOLHDL 4.6 06/05/2020 09:14 AM   LDLCALC 144 (H) 06/05/2020 09:14 AM   LDLCALC 106 (H) 01/14/2015 10:51 AM   LDLCALC 130 (H) 12/13/2012 01:06 AM    Wt Readings from Last 3 Encounters:  02/19/23 (!) 304 lb 9.6 oz (138.2 kg)  01/25/23 (!) 307 lb (139.3 kg)  01/05/23 (!) 309 lb (140.2 kg)     Exam:     Vital Signs:  BP 98/64 (BP Location: Left Arm, Patient Position: Sitting, Cuff Size: Large)   Pulse 84   Ht 5\' 5"  (1.651 m)   Wt (!) 304 lb 9.6 oz (138.2 kg)   LMP 08/15/2021   SpO2 96%   BMI 50.69 kg/m  Constitutional:  oriented to person, place, and time. No distress.  HENT:  Head: Grossly normal Eyes:  no discharge. No scleral icterus.  Neck: No JVD, no carotid bruits  Cardiovascular: Regular rate and rhythm, no murmurs appreciated Pulmonary/Chest: Clear to auscultation bilaterally, no wheezes or rails Abdominal: Soft.  no distension.  no tenderness.  Musculoskeletal: Normal range of motion Neurological:  normal muscle tone. Coordination normal. No atrophy Skin: Skin warm and dry Psychiatric: normal affect, pleasant   ASSESSMENT & PLAN:    Chronic diastolic CHF (congestive heart failure) (HCC) Currently taking torsemide 80 in the morning 40 in the afternoon with metolazone 2 days a week Recent creatinine reviewed from primary care, up to 1.8 from 1.2, mouth is dry, blood pressure lower, reports having orthostasis symptoms all day Continue Entresto, spironolactone, carvedilol at current doses Recommend she decrease metolazone down to once a week Only take second metolazone for weight 310 or higher, current weight 306  Frequent PVCs Noted on monitor, 15% burden Ejection fraction has normalized on echo Reports she was not cleared for cardiac MRI through insurance Not a candidate for ablation through EP Was started on mexiletine by CHF clinic, PVCs in bigeminal pattern noted on EKG today, she is asymptomatic  Type 2 diabetes mellitus without complication, without long-term current use of insulin (HCC) Currently on Ozempic Reports A1c trending higher, 8  Morbid obesity (HCC) Recommended low carbohydrate diet  Essential hypertension Blood pressure lower, having orthostasis symptoms, we will decrease metolazone down to once a week   Total encounter time more than 30  minutes  Greater than 50% was spent in counseling and coordination of care with the patient    Signed, Julien Nordmann, MD  02/19/2023 8:22 AM    [facility] [address], [place], [place] [address]

## 2023-02-14 ENCOUNTER — Ambulatory Visit: Admission: RE | Admit: 2023-02-14 | Payer: Managed Care, Other (non HMO) | Source: Ambulatory Visit

## 2023-02-14 LAB — COLOGUARD

## 2023-02-19 ENCOUNTER — Encounter: Payer: Self-pay | Admitting: Cardiovascular Disease

## 2023-02-19 ENCOUNTER — Ambulatory Visit: Payer: Managed Care, Other (non HMO) | Attending: Cardiovascular Disease | Admitting: Cardiovascular Disease

## 2023-02-19 VITALS — BP 98/64 | HR 84 | Ht 65.0 in | Wt 304.6 lb

## 2023-02-19 DIAGNOSIS — I5032 Chronic diastolic (congestive) heart failure: Secondary | ICD-10-CM

## 2023-02-19 DIAGNOSIS — G4733 Obstructive sleep apnea (adult) (pediatric): Secondary | ICD-10-CM | POA: Diagnosis not present

## 2023-02-19 DIAGNOSIS — I493 Ventricular premature depolarization: Secondary | ICD-10-CM

## 2023-02-19 DIAGNOSIS — I10 Essential (primary) hypertension: Secondary | ICD-10-CM | POA: Diagnosis not present

## 2023-02-19 DIAGNOSIS — I4891 Unspecified atrial fibrillation: Secondary | ICD-10-CM

## 2023-02-19 DIAGNOSIS — E119 Type 2 diabetes mellitus without complications: Secondary | ICD-10-CM

## 2023-02-19 DIAGNOSIS — Z7984 Long term (current) use of oral hypoglycemic drugs: Secondary | ICD-10-CM

## 2023-02-19 MED ORDER — CARVEDILOL 25 MG PO TABS: ORAL_TABLET | ORAL | 3 refills | Status: DC

## 2023-02-19 NOTE — Patient Instructions (Addendum)
Medication Instructions:  Metolazone down to once a week Extra metolazone for weight at home >310  If you need a refill on your cardiac medications before your next appointment, please call your pharmacy.   Lab work: BMP in 3 weeks  Testing/Procedures: No new testing needed  Follow-Up: At [facility], you and your health needs are our priority.  As part of our continuing mission to provide you with exceptional heart care, we have created designated Provider Care Teams.  These Care Teams include your primary Cardiologist (physician) and Advanced Practice Providers (APPs -  Physician Assistants and Nurse Practitioners) who all work together to provide you with the care you need, when you need it.  You will need a follow up appointment in 6 months  Providers on your designated Care Team:   Nicolasa Ducking, NP Eula Listen, PA-C Cadence Fransico Michael, [place]  COVID-19 Vaccine Information can be found at: [email or web address] For questions related to vaccine distribution or appointments, please email [email or web address] or call [phone].

## 2023-02-20 ENCOUNTER — Ambulatory Visit (INDEPENDENT_AMBULATORY_CARE_PROVIDER_SITE_OTHER): Payer: Managed Care, Other (non HMO) | Admitting: Physician Assistant

## 2023-02-20 ENCOUNTER — Encounter: Payer: Self-pay | Admitting: Physician Assistant

## 2023-02-20 VITALS — BP 132/75 | HR 71 | Temp 97.8°F | Ht 65.0 in | Wt 307.4 lb

## 2023-02-20 DIAGNOSIS — Z1211 Encounter for screening for malignant neoplasm of colon: Secondary | ICD-10-CM

## 2023-02-20 DIAGNOSIS — I5032 Chronic diastolic (congestive) heart failure: Secondary | ICD-10-CM | POA: Diagnosis not present

## 2023-02-20 DIAGNOSIS — K219 Gastro-esophageal reflux disease without esophagitis: Secondary | ICD-10-CM | POA: Diagnosis not present

## 2023-02-20 MED ORDER — FAMOTIDINE 20 MG PO TABS
20.0000 mg | ORAL_TABLET | Freq: Two times a day (BID) | ORAL | Status: AC
Start: 2023-02-20 — End: ?

## 2023-02-20 MED ORDER — PANTOPRAZOLE SODIUM 40 MG PO TBEC: 40.0000 mg | DELAYED_RELEASE_TABLET | Freq: Every day | ORAL | 3 refills | Status: AC

## 2023-02-20 NOTE — Progress Notes (Signed)
Celso Amy, PA-C [address]  [address]  [place], [place] [address]  Main: [phone]  Fax: [phone]   Gastroenterology Consultation  Referring Provider:     Center, [place]* Primary Care Physician:  Center, [facility] Primary Gastroenterologist:  Celso Amy, PA-C / Dr. Lannette Donath   Reason for Consultation:     GERD, Belching        HPI:   Heather Norman is a 47 y.o. y/o female referred for consultation & management  by Center, [employer].  She has history of acid reflux for many years.  Reports increased burning in her chest and belching, worse after eating.  Has been taking pantoprazole 40 mg daily which was not controlling her reflux.  Also taking a lot of Tums.  She was told by her cardiologist yesterday to start OTC Pepcid 20 Mg twice daily.  She denies dysphagia.  She has some nausea but no vomiting.  Admits to bowel irregularities.  Denies rectal bleeding.  Patient went to [facility] ED 01/05/2023 to evaluate chest burning, gas, and belching.  Previous history of CHF.  Currently on torsemide and medicine for PVCs.  Last Echo 12/2021 LVEF 55-60%.  Hx OSA on CPAP.  She had f/u OV with her cardiologist Dr. Mariah Milling yesterday 7/22.  Was told to f/u with with GI for GERD.  Takes pantoprazole 40 mg daily and recently started on Pepcid 20mg  BID.  Carafate was cost-prohibitive.  She admits to drinking moderate sodas.  Currently on Ozempic for Diabetes.  Has morbid obesity with BMI 51.4.  Weight 309.  Previous GI: She saw Dr. Marva Panda at [facility] clinic 09/2017 to evaluate loose stools and GERD.  She has family history of colon polyps in her mother.  Colon cancer in maternal grandmother.  Patient was scheduled for a colonoscopy in 2019, however she did not complete the procedure.  She reports having previous EGD many years ago, no results.  Patient states she completed a Cologuard test through her PCP recently which was negative.   She declines to schedule a colonoscopy.  Past Medical History:  Diagnosis Date   (HFpEF) heart failure with preserved ejection fraction (HCC)    a. 04/2017 Echo: EF 55-60%, no rwma, Mild MR, mildly dil LA. Nl RV fxn; b. 06/2019 Echo: EF 60-65%, mod LVH. Gr2 DD. Mildly dil LA.   Arthritis    kness/hands - no meds   CHF (congestive heart failure) (HCC)    Diabetes mellitus without complication (HCC)    DM (diabetes mellitus) (HCC) 05/11/2017   Dyspnea on exertion    Headache(784.0)    otc med prn   Heart murmur    Hyperlipidemia    no meds since pregnancy   Hypertension    Morbid obesity [facility])     Past Surgical History:  Procedure Laterality Date   CERVICAL CERCLAGE  06/01/2011   Procedure: CERCLAGE CERVICAL;  Surgeon: Philip Aspen, DO;  Location: [facility] ORS;  Service: Gynecology;  Laterality: N/A;  REQUESTING PORTABLE ULTARSOUND AT BEDSIDE PT'S EDC:12/02/2011   CERVICAL CERCLAGE  06/01/2011   Procedure: CERCLAGE CERVICAL;  Surgeon: Philip Aspen, DO;  Location: [facility] ORS;  Service: Gynecology;  Laterality: N/A;  REQUESTING PORTABLE ULTARSOUND AT BEDSIDE PT'S EDC:12/02/2011   endosocpy     svd     x 2   tab     x 1   TUBAL LIGATION      Prior to Admission medications   Medication Sig  Start Date End Date Taking? Authorizing Provider  albuterol (VENTOLIN HFA) 108 (90 Base) MCG/ACT inhaler Inhale 1-2 puffs into the lungs every 6 (six) hours as needed. 07/20/22   Mickie Bail, NP  aspirin EC 81 MG tablet Take 81 mg by mouth daily.    [provider]  atorvastatin (LIPITOR) 20 MG tablet TAKE 1 TABLET(20 MG) BY MOUTH DAILY 12/26/22   [name], FNP  carvedilol (COREG) 25 MG tablet TAKE 1 TABLET BY MOUTH IN THE MORNING AND 1 AND 1/2 TABLETS IN THE EVENING 02/19/23   [name], MD  Cholecalciferol (VITAMIN D3) 1.25 MG (50000 UT) CAPS Take 1 capsule by mouth once a week.    [provider]  empagliflozin (JARDIANCE) 25 MG TABS tablet Take 1 tablet (25 mg  total) by mouth daily before breakfast. 01/25/23   Delma Freeze, FNP  latanoprost (XALATAN) 0.005 % ophthalmic solution Place 1 drop into both eyes at bedtime. 01/17/22   [provider]  loratadine (CLARITIN) 10 MG tablet Take 10 mg by mouth daily. Takes daily    [provider]  meclizine (ANTIVERT) 25 MG tablet Take 1 tablet (25 mg total) by mouth 3 (three) times daily as needed for dizziness or nausea. 08/23/22   Sharman Cheek, MD  metolazone (ZAROXOLYN) 2.5 MG tablet Take 1 tablet (2.5 mg total) by mouth 2 (two) times a week. 01/25/23   Delma Freeze, FNP  mexiletine (MEXITIL) 200 MG capsule Take 1 capsule (200 mg total) by mouth 2 (two) times daily. 01/01/23   Bensimhon, Bevelyn Buckles, MD  ondansetron (ZOFRAN-ODT) 4 MG disintegrating tablet Take 1 tablet (4 mg total) by mouth every 8 (eight) hours as needed for nausea or vomiting. 08/23/22   Sharman Cheek, MD  oxybutynin (DITROPAN) 5 MG tablet Take 5 mg by mouth 2 (two) times daily. 12/25/17   [provider]  pantoprazole (PROTONIX) 40 MG tablet Take 40 mg by mouth daily. 01/27/21   [provider]  potassium chloride 20 MEQ/15ML (10%) SOLN Take 45 mLs (60 mEq total) by mouth daily. And additional on the days you take the metolazone Patient taking differently: Take 60 mEq by mouth daily. And additional on the days you take the metolazone 01/25/23   [name], FNP  sacubitril-valsartan (ENTRESTO) 97-103 MG Take 1 tablet by mouth 2 (two) times daily. 12/19/22   Delma Freeze, FNP  Semaglutide,0.25 or 0.5MG /DOS, 2 MG/3ML SOPN Inject 0.25 mg into the skin once a week. 11/06/22   Delma Freeze, FNP  spironolactone (ALDACTONE) 25 MG tablet TAKE 1 TABLET(25 MG) BY MOUTH DAILY 04/13/22   [name], MD  torsemide (DEMADEX) 20 MG tablet TAKE 4 TABLETS(80 MG) BY MOUTH QAM and 40mg  QPM 01/01/23   Bensimhon, Bevelyn Buckles, MD    Family History  Problem Relation Age of Onset   Diabetes Mother     Hypertension Mother    Hyperlipidemia Mother    Hypertension Father    Breast cancer Maternal Grandmother 74     Social History   Tobacco Use   Smoking status: Never   Smokeless tobacco: Never  Vaping Use   Vaping status: Never Used  Substance Use Topics   Alcohol use: No   Drug use: No    Allergies as of 02/20/2023 - Review Complete 02/20/2023  Allergen Reaction Noted   Penicillins Anaphylaxis 05/18/2011    Review of Systems:    All systems reviewed and negative except where  noted in HPI.   Physical Exam:  BP 132/75   Pulse 71   Temp 97.8 F (36.6 C)   Ht 5\' 5"  (1.651 m)   Wt (!) 307 lb 6.4 oz (139.4 kg)   LMP 08/15/2021 Comment: irregular  BMI 51.15 kg/m  Patient's last menstrual period was 08/15/2021. Psych:  Alert and cooperative. Normal mood and affect. General:   Alert,  Well-developed, obese, pleasant and cooperative in NAD Head:  Normocephalic and atraumatic. Eyes:  Sclera clear, no icterus.   Conjunctiva pink. Neck:  Supple; no masses or thyromegaly. Lungs:  Respirations even and unlabored.  Clear throughout to auscultation.   No wheezes, crackles, or rhonchi. No acute distress. Heart:  Regular rate and rhythm; no murmurs, clicks, rubs, or gallops. Abdomen:  Normal bowel sounds.  No bruits.  Soft, and obese without masses, hepatosplenomegaly or hernias noted.  No Tenderness.  No guarding or rebound tenderness.    Neurologic:  Alert and oriented x3;  grossly normal neurologically. Psych:  Alert and cooperative. Normal mood and affect.  Imaging Studies: MM 3D DIAGNOSTIC MAMMOGRAM UNILATERAL RIGHT BREAST  Result Date: 01/31/2023 CLINICAL DATA:  Recall from screening mammography, possible mass or developing asymmetry in the outer RIGHT breast at anterior middle depth. EXAM: DIGITAL DIAGNOSTIC UNILATERAL RIGHT MAMMOGRAM WITH TOMOSYNTHESIS AND CAD; ULTRASOUND RIGHT BREAST LIMITED TECHNIQUE: Right digital diagnostic mammography and breast tomosynthesis was  performed. The images were evaluated with computer-aided detection. ; Targeted ultrasound examination of the right breast was performed COMPARISON:  Previous exam(s). ACR Breast Density Category b: There are scattered areas of fibroglandular density. FINDINGS: Spot compression CC and MLO views of the area of concern were obtained. Circumscribed isodense mass measuring on the order of 1 cm in size in the outer breast at anterior to middle depth. No associated architectural distortion or suspicious calcifications. Targeted ultrasound is performed, demonstrating a focally dilated duct at the 10 o'clock position 8 cm from the nipple at middle depth, extending over an approximate 0.9 cm length, with a diameter of approximately 0.2-0.3 cm. Within the focally dilated duct is echogenic material measuring 0.4 x 0.2 x 0.3 cm; there is no internal power Doppler flow within the duct or the echogenic focus. Sonographic evaluation of the RIGHT axilla demonstrates no pathologic lymphadenopathy. IMPRESSION: 1. 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 2. No pathologic RIGHT axillary lymphadenopathy. RECOMMENDATION: RIGHT breast ultrasound in 6 months to ensure that the echogenic material in the duct is indeed debris. I have discussed the findings and recommendations with the patient. If applicable, a reminder letter will be sent to the patient regarding the next appointment. BI-RADS CATEGORY  3: Probably benign. Electronically Signed   By: Hulan Saas M.D.   On: 01/31/2023 14:42  [place] LIMITED ULTRASOUND INCLUDING AXILLA RIGHT BREAST  Result Date: 01/31/2023 CLINICAL DATA:  Recall from screening mammography, possible mass or developing asymmetry in the outer RIGHT breast at anterior middle depth. EXAM: DIGITAL DIAGNOSTIC UNILATERAL RIGHT MAMMOGRAM WITH TOMOSYNTHESIS AND CAD; ULTRASOUND  RIGHT BREAST LIMITED TECHNIQUE: Right digital diagnostic mammography and breast tomosynthesis was performed. The images were evaluated with computer-aided detection. ; Targeted ultrasound examination of the right breast was performed COMPARISON:  Previous exam(s). ACR Breast Density Category b: There are scattered areas of fibroglandular density. FINDINGS: Spot compression CC and MLO views of the area of concern were obtained. Circumscribed isodense mass measuring  on the order of 1 cm in size in the outer breast at anterior to middle depth. No associated architectural distortion or suspicious calcifications. Targeted ultrasound is performed, demonstrating a focally dilated duct at the 10 o'clock position 8 cm from the nipple at middle depth, extending over an approximate 0.9 cm length, with a diameter of approximately 0.2-0.3 cm. Within the focally dilated duct is echogenic material measuring 0.4 x 0.2 x 0.3 cm; there is no internal power Doppler flow within the duct or the echogenic focus. Sonographic evaluation of the RIGHT axilla demonstrates no pathologic lymphadenopathy. IMPRESSION: 1. 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 2. No pathologic RIGHT axillary lymphadenopathy. RECOMMENDATION: RIGHT breast ultrasound in 6 months to ensure that the echogenic material in the duct is indeed debris. I have discussed the findings and recommendations with the patient. If applicable, a reminder letter will be sent to the patient regarding the next appointment. BI-RADS CATEGORY  3: Probably benign. Electronically Signed   By: Hulan Saas M.D.   On: 01/31/2023 14:42  MM 3D SCREENING MAMMOGRAM BILATERAL BREAST  Result Date: 01/24/2023 CLINICAL DATA:  Screening. EXAM: DIGITAL SCREENING BILATERAL MAMMOGRAM WITH TOMOSYNTHESIS AND CAD TECHNIQUE: Bilateral screening digital  craniocaudal and mediolateral oblique mammograms were obtained. Bilateral screening digital breast tomosynthesis was performed. The images were evaluated with computer-aided detection. COMPARISON:  Previous exam(s). ACR Breast Density Category b: There are scattered areas of fibroglandular density. FINDINGS: In the right breast, a possible asymmetry warrants further evaluation. In the left breast, no findings suspicious for malignancy. IMPRESSION: Further evaluation is suggested for a possible asymmetry in the right breast. RECOMMENDATION: Diagnostic mammogram and possibly ultrasound of the right breast. (Code:[id number]) The patient will be contacted regarding the findings, and additional imaging will be scheduled. BI-RADS CATEGORY  0: Incomplete: Need additional imaging evaluation. Electronically Signed   By: Hulan Saas M.D.   On: 01/24/2023 13:40    Assessment and Plan:   Heather Norman is a 47 y.o. y/o female has been referred for   1.  GERD for many years, Not controlled on PPI  Scheduling EGD to Screen for Barrett's I discussed risks of EGD with patient to include risk of bleeding, perforation, and risk of sedation.  Patient expressed understanding and agrees to proceed with EGD.   Recommend Lifestyle Modifications to prevent Acid Reflux.  Rec. Avoid coffee, sodas, peppermint, citrus fruits, and spicey foods.  Avoid eating 2-3 hours before bedtime.   We discussed adverse side effects of PPIs to include vitamin deficiencies, osteoporosis, renal insufficiency, dementia and increased risk of C. Difficile.  Recommend take lowest effective dose of PPI necessary to control acid reflux.  OK to add H2RB (Pepcid 20mg  daily) or antiacid if needed for breakthrough acid reflux.   2.  Colon Cancer Screening - Guidelines Discussed. She has Family history of colon cancer (mGma) and colon polyps (Mom). She had recent Negative Cologuard test through her PCP. Pt. Declined to schedule Colonoscopy.  She  prefers to do Cologuard Q 3 years.  3.  Morbid obesity, BMI 51  Encouraged Weight loss to help GERD.  4.  History of CHF  Get Cardiac Clearance for EGD with sedation.  Follow up PRN based on GI symptoms and EGD results.  Celso Amy, PA-C

## 2023-02-21 ENCOUNTER — Telehealth: Payer: Self-pay | Admitting: Cardiovascular Disease

## 2023-02-21 ENCOUNTER — Encounter: Payer: Self-pay | Admitting: Physician Assistant

## 2023-02-21 ENCOUNTER — Telehealth: Payer: Self-pay

## 2023-02-21 NOTE — Telephone Encounter (Signed)
Dr. Mariah Milling, patient's chart reviewed for preoperative cardiac evaluation.  Patient was seen by you on 02/19/23 and according to protocol we request that you comment on clearance to undergo procedure since office visit was less than 2 months ago. She is seeking clearance for colonoscopy, date TBD.    Please route your response to p cv div preop.  Thank you, Marcelino Duster

## 2023-02-21 NOTE — Telephone Encounter (Signed)
Patient stated she had yogurt for breakfast. She stated this may have aggravated her esophagus. She stated she would get some Mylanta or Maalox after work today. I let her know that her procedures couldn't be moved  due to not having any availability.patient's pain seemed to have settled down some.     She is currently taking pantoprazole 40 Mg once daily and Pepcid 20 Mg twice daily for GERD.  She is scheduled for EGD with Dr. Allegra Lai 04/03/2023.  See if her EGD can be moved up to sooner to evaluate odynophagia (painful swallowing).  Also, I recommend patient take OTC Mylanta or Maalox liquid to help with esophageal pain.  Avoid citrus fruits, juices, spicy, and acidic foods.  Avoid sodas.

## 2023-02-21 NOTE — Telephone Encounter (Signed)
   Pre-operative Risk Assessment    Patient Name: Heather Norman  DOB: 1976-01-05 MRN: [id number]      Request for Surgical Clearance    Procedure:  Colonoscopy  Date of Surgery:  Clearance TBD                                 Surgeon:  not listed  Surgeon's Group or Practice Name:  [facility] Phone number:  [phone] Fax number:  [phone]   Type of Clearance Requested:   - Medical    Type of Anesthesia:  General    Additional requests/questions:    SignedNarda Amber   02/21/2023, 9:47 AM

## 2023-02-22 ENCOUNTER — Telehealth: Payer: Self-pay

## 2023-02-22 NOTE — Telephone Encounter (Signed)
Per fax and phone call Heather Norman has denied EGD.  I sent notes but still denied it. They are asking for a peer to peer at [phone]. I  will fax letter to .

## 2023-02-23 NOTE — Telephone Encounter (Signed)
   Patient Name: Heather Norman  DOB: Jul 01, 1976 MRN: [id number]  Primary Cardiologist: Julien Nordmann, MD  Chart reviewed as part of pre-operative protocol coverage. Given past medical history and time since last visit, based on ACC/AHA guidelines, Heather Norman is at acceptable risk for the planned procedure without further cardiovascular testing.   The patient was advised that if she develops new symptoms prior to surgery to contact our office to arrange for a follow-up visit, and she verbalized understanding.  Per Dr. Mariah Milling:  Molli Knock to proceed without further cardiac testing.   Per office protocol, if patient is without any new symptoms or concerns at the time of their virtual visit, he/she may hold Jardiance for 3 days prior to procedure. Please resume Jardiance as soon as possible postprocedure, at the discretion of the surgeon.    I will route this recommendation to the requesting party via Epic fax function and remove from pre-op pool.  Please call with questions.  Joni Reining, NP 02/23/2023, 11:15 AM

## 2023-02-26 ENCOUNTER — Telehealth: Payer: Self-pay

## 2023-02-26 ENCOUNTER — Encounter: Payer: Self-pay | Admitting: Family

## 2023-02-26 NOTE — Telephone Encounter (Signed)
Received Cardiac Clearance from Dr.Timothy Gollan-Acceptable risk for colonoscopy with general anesthesia  No further cardiac testing needed  Thx   Dossie Arbour, MD, Ph.D  [facility]

## 2023-02-27 ENCOUNTER — Other Ambulatory Visit: Payer: Self-pay

## 2023-02-27 DIAGNOSIS — I5032 Chronic diastolic (congestive) heart failure: Secondary | ICD-10-CM

## 2023-03-05 ENCOUNTER — Telehealth (HOSPITAL_COMMUNITY): Payer: Self-pay

## 2023-03-05 ENCOUNTER — Other Ambulatory Visit
Admission: RE | Admit: 2023-03-05 | Discharge: 2023-03-05 | Disposition: A | Discharge status: Home / Self Care | Payer: Managed Care, Other (non HMO) | Attending: Family Medicine | Admitting: Family Medicine

## 2023-03-05 DIAGNOSIS — I5032 Chronic diastolic (congestive) heart failure: Secondary | ICD-10-CM

## 2023-03-05 LAB — BASIC METABOLIC PANEL
Anion gap: 11 (ref 5–15)
BUN: 36 mg/dL — ABNORMAL HIGH (ref 6–20)
CO2: 26 mmol/L (ref 22–32)
Calcium: 9 mg/dL (ref 8.9–10.3)
Chloride: 98 mmol/L (ref 98–111)
Creatinine, Ser: 1.74 mg/dL — ABNORMAL HIGH (ref 0.44–1.00)
GFR, Estimated: 36 mL/min — ABNORMAL LOW (ref >60)
Glucose, Bld: 201 mg/dL — ABNORMAL HIGH (ref 70–99)
Potassium: 4 mmol/L (ref 3.5–5.1)
Sodium: 135 mmol/L (ref 135–145)

## 2023-03-05 LAB — BRAIN NATRIURETIC PEPTIDE: B Natriuretic Peptide: 56.5 pg/mL (ref 0.0–100.0)

## 2023-03-05 MED ORDER — TORSEMIDE 20 MG PO TABS: 80.0000 mg | ORAL_TABLET | Freq: Every day | ORAL | 6 refills | Status: DC

## 2023-03-05 NOTE — Telephone Encounter (Signed)
  Pt aware, agreeable, and verbalized understanding Labs will be done at [facility], [place] 03/05/2023 10:50 AM EDT     Kidney function elevated, please decrease torsemide to 80 mg daily. Repeat BMET in 10 days

## 2023-03-19 ENCOUNTER — Encounter (HOSPITAL_COMMUNITY): Payer: Self-pay

## 2023-03-19 ENCOUNTER — Other Ambulatory Visit
Admission: RE | Admit: 2023-03-19 | Discharge: 2023-03-19 | Disposition: A | Discharge status: Home / Self Care | Payer: Managed Care, Other (non HMO) | Source: Ambulatory Visit | Attending: Family Medicine | Admitting: Family Medicine

## 2023-03-19 DIAGNOSIS — I5032 Chronic diastolic (congestive) heart failure: Secondary | ICD-10-CM | POA: Insufficient documentation

## 2023-03-19 LAB — BASIC METABOLIC PANEL
Anion gap: 8 (ref 5–15)
BUN: 20 mg/dL (ref 6–20)
CO2: 27 mmol/L (ref 22–32)
Calcium: 8.4 mg/dL — ABNORMAL LOW (ref 8.9–10.3)
Chloride: 104 mmol/L (ref 98–111)
Creatinine, Ser: 1.43 mg/dL — ABNORMAL HIGH (ref 0.44–1.00)
GFR, Estimated: 45 mL/min — ABNORMAL LOW (ref >60)
Glucose, Bld: 167 mg/dL — ABNORMAL HIGH (ref 70–99)
Potassium: 3.3 mmol/L — ABNORMAL LOW (ref 3.5–5.1)
Sodium: 139 mmol/L (ref 135–145)

## 2023-03-21 ENCOUNTER — Ambulatory Visit: Admission: RE | Admit: 2023-03-21 | Payer: Managed Care, Other (non HMO) | Source: Ambulatory Visit

## 2023-03-26 ENCOUNTER — Other Ambulatory Visit: Payer: Self-pay

## 2023-03-26 ENCOUNTER — Other Ambulatory Visit
Admission: RE | Admit: 2023-03-26 | Discharge: 2023-03-26 | Disposition: A | Discharge status: Home / Self Care | Payer: Managed Care, Other (non HMO) | Source: Ambulatory Visit | Attending: Internal Medicine | Admitting: Internal Medicine

## 2023-03-26 ENCOUNTER — Emergency Department: Payer: Managed Care, Other (non HMO)

## 2023-03-26 ENCOUNTER — Encounter: Payer: Self-pay | Admitting: Emergency Medicine

## 2023-03-26 ENCOUNTER — Ambulatory Visit (HOSPITAL_BASED_OUTPATIENT_CLINIC_OR_DEPARTMENT_OTHER): Payer: Managed Care, Other (non HMO) | Admitting: Internal Medicine

## 2023-03-26 ENCOUNTER — Telehealth: Payer: Self-pay

## 2023-03-26 VITALS — BP 102/72 | HR 60 | Ht 65.0 in | Wt 307.0 lb

## 2023-03-26 DIAGNOSIS — G4733 Obstructive sleep apnea (adult) (pediatric): Secondary | ICD-10-CM | POA: Diagnosis not present

## 2023-03-26 DIAGNOSIS — Z833 Family history of diabetes mellitus: Secondary | ICD-10-CM | POA: Diagnosis not present

## 2023-03-26 DIAGNOSIS — N189 Chronic kidney disease, unspecified: Secondary | ICD-10-CM | POA: Diagnosis not present

## 2023-03-26 DIAGNOSIS — E785 Hyperlipidemia, unspecified: Secondary | ICD-10-CM | POA: Insufficient documentation

## 2023-03-26 DIAGNOSIS — I493 Ventricular premature depolarization: Secondary | ICD-10-CM

## 2023-03-26 DIAGNOSIS — I5032 Chronic diastolic (congestive) heart failure: Secondary | ICD-10-CM | POA: Insufficient documentation

## 2023-03-26 DIAGNOSIS — Z8249 Family history of ischemic heart disease and other diseases of the circulatory system: Secondary | ICD-10-CM | POA: Diagnosis not present

## 2023-03-26 DIAGNOSIS — R0789 Other chest pain: Secondary | ICD-10-CM | POA: Insufficient documentation

## 2023-03-26 DIAGNOSIS — Z7984 Long term (current) use of oral hypoglycemic drugs: Secondary | ICD-10-CM | POA: Diagnosis not present

## 2023-03-26 DIAGNOSIS — I130 Hypertensive heart and chronic kidney disease with heart failure and stage 1 through stage 4 chronic kidney disease, or unspecified chronic kidney disease: Secondary | ICD-10-CM | POA: Insufficient documentation

## 2023-03-26 DIAGNOSIS — I10 Essential (primary) hypertension: Secondary | ICD-10-CM | POA: Diagnosis not present

## 2023-03-26 DIAGNOSIS — E876 Hypokalemia: Secondary | ICD-10-CM | POA: Insufficient documentation

## 2023-03-26 DIAGNOSIS — E119 Type 2 diabetes mellitus without complications: Secondary | ICD-10-CM | POA: Insufficient documentation

## 2023-03-26 DIAGNOSIS — R079 Chest pain, unspecified: Secondary | ICD-10-CM | POA: Diagnosis present

## 2023-03-26 DIAGNOSIS — E1122 Type 2 diabetes mellitus with diabetic chronic kidney disease: Secondary | ICD-10-CM | POA: Insufficient documentation

## 2023-03-26 LAB — LIPASE, BLOOD: Lipase: 55 U/L — ABNORMAL HIGH (ref 11–51)

## 2023-03-26 LAB — COMPREHENSIVE METABOLIC PANEL
ALT: 29 U/L (ref 0–44)
AST: 30 U/L (ref 15–41)
Albumin: 3.5 g/dL (ref 3.5–5.0)
Alkaline Phosphatase: 72 U/L (ref 38–126)
Anion gap: 11 (ref 5–15)
BUN: 24 mg/dL — ABNORMAL HIGH (ref 6–20)
CO2: 27 mmol/L (ref 22–32)
Calcium: 8.7 mg/dL — ABNORMAL LOW (ref 8.9–10.3)
Chloride: 99 mmol/L (ref 98–111)
Creatinine, Ser: 1.53 mg/dL — ABNORMAL HIGH (ref 0.44–1.00)
GFR, Estimated: 42 mL/min — ABNORMAL LOW (ref >60)
Glucose, Bld: 211 mg/dL — ABNORMAL HIGH (ref 70–99)
Potassium: 2.9 mmol/L — ABNORMAL LOW (ref 3.5–5.1)
Sodium: 137 mmol/L (ref 135–145)
Total Bilirubin: 0.8 mg/dL (ref 0.3–1.2)
Total Protein: 7.2 g/dL (ref 6.5–8.1)

## 2023-03-26 LAB — CBC
HCT: 38.7 % (ref 36.0–46.0)
Hemoglobin: 13 g/dL (ref 12.0–15.0)
MCH: 29.7 pg (ref 26.0–34.0)
MCHC: 33.6 g/dL (ref 30.0–36.0)
MCV: 88.4 fL (ref 80.0–100.0)
Platelets: 280 10*3/uL (ref 150–400)
RBC: 4.38 MIL/uL (ref 3.87–5.11)
RDW: 15.3 % (ref 11.5–15.5)
WBC: 6.3 10*3/uL (ref 4.0–10.5)
nRBC: 0 % (ref 0.0–0.2)

## 2023-03-26 LAB — CBC WITH DIFFERENTIAL/PLATELET
Abs Immature Granulocytes: 0.01 10*3/uL (ref 0.00–0.07)
Basophils Absolute: 0.1 10*3/uL (ref 0.0–0.1)
Basophils Relative: 1 %
Eosinophils Absolute: 0.1 10*3/uL (ref 0.0–0.5)
Eosinophils Relative: 2 %
HCT: 39.9 % (ref 36.0–46.0)
Hemoglobin: 13.1 g/dL (ref 12.0–15.0)
Immature Granulocytes: 0 %
Lymphocytes Relative: 45 %
Lymphs Abs: 3.3 10*3/uL (ref 0.7–4.0)
MCH: 29.8 pg (ref 26.0–34.0)
MCHC: 32.8 g/dL (ref 30.0–36.0)
MCV: 90.9 fL (ref 80.0–100.0)
Monocytes Absolute: 0.6 10*3/uL (ref 0.1–1.0)
Monocytes Relative: 9 %
Neutro Abs: 3.1 10*3/uL (ref 1.7–7.7)
Neutrophils Relative %: 43 %
Platelets: 267 10*3/uL (ref 150–400)
RBC: 4.39 MIL/uL (ref 3.87–5.11)
RDW: 15.2 % (ref 11.5–15.5)
WBC: 7.2 10*3/uL (ref 4.0–10.5)
nRBC: 0 % (ref 0.0–0.2)

## 2023-03-26 LAB — URINALYSIS, ROUTINE W REFLEX MICROSCOPIC
Bacteria, UA: NONE SEEN
Bilirubin Urine: NEGATIVE
Glucose, UA: >=500 mg/dL — AB
Hgb urine dipstick: NEGATIVE
Ketones, ur: NEGATIVE mg/dL
Leukocytes,Ua: NEGATIVE
Nitrite: NEGATIVE
Protein, ur: NEGATIVE mg/dL
Specific Gravity, Urine: 1.015 (ref 1.005–1.030)
pH: 5 (ref 5.0–8.0)

## 2023-03-26 LAB — POC URINE PREG, ED: Preg Test, Ur: NEGATIVE

## 2023-03-26 LAB — BASIC METABOLIC PANEL
Anion gap: 12 (ref 5–15)
BUN: 23 mg/dL — ABNORMAL HIGH (ref 6–20)
CO2: 26 mmol/L (ref 22–32)
Calcium: 8.5 mg/dL — ABNORMAL LOW (ref 8.9–10.3)
Chloride: 99 mmol/L (ref 98–111)
Creatinine, Ser: 1.41 mg/dL — ABNORMAL HIGH (ref 0.44–1.00)
GFR, Estimated: 46 mL/min — ABNORMAL LOW (ref >60)
Glucose, Bld: 174 mg/dL — ABNORMAL HIGH (ref 70–99)
Potassium: 3 mmol/L — ABNORMAL LOW (ref 3.5–5.1)
Sodium: 137 mmol/L (ref 135–145)

## 2023-03-26 LAB — BRAIN NATRIURETIC PEPTIDE: B Natriuretic Peptide: 139.8 pg/mL — ABNORMAL HIGH (ref 0.0–100.0)

## 2023-03-26 LAB — TROPONIN I (HIGH SENSITIVITY): Troponin I (High Sensitivity): 8 ng/L (ref <18)

## 2023-03-26 MED ORDER — MEXILETINE HCL 150 MG PO CAPS: 300.0000 mg | ORAL_CAPSULE | Freq: Two times a day (BID) | ORAL | 3 refills | Status: DC

## 2023-03-26 NOTE — ED Triage Notes (Signed)
Pt presents ambulatory to triage via POV with complaints of CP since July 23rd. Pt endorses hx of chronic GERD and has taken prescribed medications without relief. Pt also endorses some dysuria and mild flank (R) pain that started over the weekend. A&Ox4 at this time. Denies fevers, chills, N/V or SOB.

## 2023-03-26 NOTE — Telephone Encounter (Signed)
  Electa Sniff, RN      Pt aware, agreeable, and verbalized understanding. Spoke with pt over the phone to make aware of MD recommendations. Pt stated taking Potassium 20 mEq/54ml.   Pt was advice to take Kcl 40 mEq x 3 doses today. Pt stated she does not need a prescription and she will take her liquid potassium.   Pt aware, agreeable, and verbalized understanding    Dolores Patty, MD      Can you have her take kcl 40 meq x 3 doses today?   thanks

## 2023-03-26 NOTE — Progress Notes (Signed)
ADVANCED HF CLINIC NOTE  Referring Provider: Clarisa Kindred, NP Primary Care: Center, [facility] Primary Cardiologist: Julien Nordmann, MD   HPI:  Heather Norman is a 47 y/o female with a history of arthritis, DM, hyperlipidemia, HTN, morbid obesity and chronic heart failure.   Echo report from 01/26/22 reviewed and showed an EF of 55-60% along with mild LVH and trivial MR. Echo report from 02/10/21 Left ventricular ejection fraction, by estimation, is 50 to 55%. The left ventricle has low normal function. The left ventricle has no regional wall motion abnormalities. Left ventricular diastolic parameters are consistent with Grade I diastolic dysfunction (impaired relaxation).  Zio 10/21 12.5% PVCs Saw EP Lalla Brothers) and suggested possible AAD (flecainide or propafenone) if developed symptoms or LV dysfunction.    Was in the ED 06/07/22 due to SOB and abdominal bloating. Oral diuretic increased and she was released.   Sleep study 07/24/22: AHI 35.4 Sats down to 75%.  Possible AF during study lasting 4.5 hours. Zio placed    Zio patch 1/24  - Sinus rhythm. No AF - 44 runs NSVT (longest 8 beats)  - 21.3% PVCs (3 morphologies 9.7%, 7.3%, 4.3%)  Saw Dr. Lalla Brothers in 3/24. felt not to be ablation candidate due to obesity. Recommended continue carvedilol as EF stable  Saw Dr. Tresa Endo for CPAP titration 4/24. CPAP report shows well controlled OSA  Seen in ER 10/30/22 for CP. Hstrop, ECG and d-Dimer all normal. BNP elevated 47 -> 480 so torsemide increased. .    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   Here for f/u. Feels worse. Having CP frequently. Worse with exertion and eating. Limits her activities. Using GI cocktail, mylanta and TUMS which helps some but still with exertional symptoms. Awaiting approval for EGD. Marland KitchenWearing CPAP but gets cottonmouth. Recently had AKI so metolazone was dropped to once a week. Still with back pain. Breathing ok. No edema. Now on torsemide 80 daily  with metolazone 1x/week. Recent BMET 03/19/23  SCr down to 1.4. Denies palpitations.   Past Medical History:  Diagnosis Date   (HFpEF) heart failure with preserved ejection fraction (HCC)    a. 04/2017 Echo: EF 55-60%, no rwma, Mild MR, mildly dil LA. Nl RV fxn; b. 06/2019 Echo: EF 60-65%, mod LVH. Gr2 DD. Mildly dil LA.   Arthritis    kness/hands - no meds   CHF (congestive heart failure) (HCC)    Diabetes mellitus without complication (HCC)    DM (diabetes mellitus) (HCC) 05/11/2017   Dyspnea on exertion    Headache(784.0)    otc med prn   Heart murmur    Hyperlipidemia    no meds since pregnancy   Hypertension    Morbid obesity (HCC)     Current Outpatient Medications  Medication Sig Dispense Refill   aspirin EC 81 MG tablet Take 81 mg by mouth daily.     atorvastatin (LIPITOR) 20 MG tablet TAKE 1 TABLET(20 MG) BY MOUTH DAILY 90 tablet 3   carvedilol (COREG) 25 MG tablet TAKE 1 TABLET BY MOUTH IN THE MORNING AND 1 AND 1/2 TABLETS IN THE EVENING 225 tablet 3   empagliflozin (JARDIANCE) 25 MG TABS tablet Take 1 tablet (25 mg total) by mouth daily before breakfast. 30 tablet 6   famotidine (PEPCID) 20 MG tablet Take 1 tablet (20 mg total) by mouth 2 (two) times daily.     latanoprost (XALATAN) 0.005 % ophthalmic solution Place 1 drop into both eyes at bedtime.  loratadine (CLARITIN) 10 MG tablet Take 10 mg by mouth daily. Takes daily     metolazone (ZAROXOLYN) 2.5 MG tablet Take 1 tablet (2.5 mg total) by mouth 2 (two) times a week. (Patient taking differently: Take 2.5 mg by mouth 2 (two) times a week. Once wkly now per pt) 8 tablet 5   mexiletine (MEXITIL) 200 MG capsule Take 1 capsule (200 mg total) by mouth 2 (two) times daily. 180 capsule 3   oxybutynin (DITROPAN) 5 MG tablet Take 5 mg by mouth 2 (two) times daily.     pantoprazole (PROTONIX) 40 MG tablet Take 1 tablet (40 mg total) by mouth daily. 90 tablet 3   potassium chloride 20 MEQ/15ML (10%) SOLN Take 45 mLs (60 mEq  total) by mouth daily. And additional on the days you take the metolazone (Patient taking differently: Take 60 mEq by mouth daily. And additional on the days you take the metolazone) 1800 mL 5   sacubitril-valsartan (ENTRESTO) 97-103 MG Take 1 tablet by mouth 2 (two) times daily. 90 tablet 3   Semaglutide,0.25 or 0.5MG /DOS, 2 MG/3ML SOPN Inject 0.25 mg into the skin once a week. 3 mL 5   spironolactone (ALDACTONE) 25 MG tablet TAKE 1 TABLET(25 MG) BY MOUTH DAILY 90 tablet 3   torsemide (DEMADEX) 20 MG tablet Take 4 tablets (80 mg total) by mouth daily. 180 tablet 6   albuterol (VENTOLIN HFA) 108 (90 Base) MCG/ACT inhaler Inhale 1-2 puffs into the lungs every 6 (six) hours as needed. (Patient not taking: Reported on 03/26/2023) 18 g 0   Cholecalciferol (VITAMIN D3) 1.25 MG (50000 UT) CAPS Take 1 capsule by mouth once a week. (Patient not taking: Reported on 03/26/2023)     meclizine (ANTIVERT) 25 MG tablet Take 1 tablet (25 mg total) by mouth 3 (three) times daily as needed for dizziness or nausea. (Patient not taking: Reported on 03/26/2023) 30 tablet 1   ondansetron (ZOFRAN-ODT) 4 MG disintegrating tablet Take 1 tablet (4 mg total) by mouth every 8 (eight) hours as needed for nausea or vomiting. (Patient not taking: Reported on 03/26/2023) 20 tablet 0   No current facility-administered medications for this visit.    Allergies  Allergen Reactions   Penicillins Anaphylaxis    Has patient had a PCN reaction causing immediate rash, facial/tongue/throat swelling, SOB or lightheadedness with hypotension: Yes Has patient had a PCN reaction causing severe rash involving mucus membranes or skin necrosis: No Has patient had a PCN reaction that required hospitalization: No Has patient had a PCN reaction occurring within the last 10 years: No If all of the above answers are "NO", then may proceed with Cephalosporin use.      Social History   Socioeconomic History   Marital status: Single     Spouse name: Not on file   Number of children: 3   Years of education: 12   Highest education level: 12th grade  Occupational History   Not on file  Tobacco Use   Smoking status: Never   Smokeless tobacco: Never  Vaping Use   Vaping status: Never Used  Substance and Sexual Activity   Alcohol use: No   Drug use: No   Sexual activity: Yes    Birth control/protection: Surgical  Other Topics Concern   Not on file  Social History Narrative   Not on file   Social Determinants of Health   Financial Resource Strain: Medium Risk (08/13/2017)   Overall Financial Resource Strain (CARDIA)  Difficulty of Paying Living Expenses: Somewhat hard  Food Insecurity: Food Insecurity Present (08/13/2017)   Hunger Vital Sign    Worried About Running Out of Food in the Last Year: Often true    Ran Out of Food in the Last Year: Sometimes true  Transportation Needs: No Transportation Needs (08/13/2017)   PRAPARE - Administrator, Civil Service (Medical): No    Lack of Transportation (Non-Medical): No  Physical Activity: Sufficiently Active (06/09/2019)   Exercise Vital Sign    Days of Exercise per Week: 5 days    Minutes of Exercise per Session: 150+ min  Stress: Stress Concern Present (08/13/2017)   [employer] of Occupational Health - Occupational Stress Questionnaire    Feeling of Stress : Rather much  Social Connections: Moderately Isolated (08/13/2017)   Social Connection and Isolation Panel [NHANES]    Frequency of Communication with Friends and Family: More than three times a week    Frequency of Social Gatherings with Friends and Family: Never    Attends Religious Services: Never    Database administrator or Organizations: No    Attends Banker Meetings: Never    Marital Status: Never married  Intimate Partner Violence: Not At Risk (06/09/2019)   Humiliation, Afraid, Rape, and Kick questionnaire    Fear of Current or Ex-Partner: No    Emotionally Abused: No     Physically Abused: No    Sexually Abused: No      Family History  Problem Relation Age of Onset   Diabetes Mother    Hypertension Mother    Hyperlipidemia Mother    Hypertension Father    Breast cancer Maternal Grandmother 50    Vitals:   03/26/23 0925 03/26/23 0933  BP: (!) 85/50 102/72  Pulse: (!) 30 60  SpO2: 97% 97%  Weight: (!) 307 lb (139.3 kg)   Height: 5\' 5"  (1.651 m)       Wt Readings from Last 3 Encounters:  03/26/23 (!) 307 lb (139.3 kg)  02/20/23 (!) 307 lb 6.4 oz (139.4 kg)  02/19/23 (!) 304 lb 9.6 oz (138.2 kg)    PHYSICAL EXAM: General:  Obese. Well appearing. No resp difficulty HEENT: normal Neck: supple. no JVD. Carotids 2+ bilat; no bruits. No lymphadenopathy or thryomegaly appreciated. Cor: PMI nondisplaced. Irregular rate & rhythm. No rubs, gallops or murmurs. Lungs: clear Abdomen: soft, nontender, nondistended. No hepatosplenomegaly. No bruits or masses. Good bowel sounds. Extremities: no cyanosis, clubbing, rash, edema Neuro: alert & orientedx3, cranial nerves grossly intact. moves all 4 extremities w/o difficulty. Affect pleasant   ECG: Sinus with frequent PVCss Personally reviewed    ASSESSMENT & PLAN:   1. Chest pain, exertional - multiple CRFs - ideally would do chest CTA but I spoke with CT team and unable to get images due to PVCs - Plan R/L cath this week - If cath negative needs EGD  2. 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 BNP 47 -> 580 - NYHA II - Volume status seems improved with recent diuretic titrations - Continue Jardiance 10  - Continue torsemide 40 daily with metolazone once a week - Continue spiro 25 daily - Continue Entresto 97/103 bid - Continue carvedilol 25 bid  - Labs today. - Plan RHC with LHC scheduled or this week    3. HTN - Blood pressure  well  controlled. Continue current regimen.   4. PVC's, frequent - Zio 10/21 12.5% PVCs Saw EP Lalla Brothers) and suggested possible AAD (flecainide or propafenone) if developed symptoms or LV dysfunction.  - Zio patch 1/24. Sinus rhythm. No AF. 44 runs NSVT (longest 8 beats). 21.3% PVCs (3 morphologies 9.7%, 7.3%, 4.3%) - suspect driven by OSA but could aslo be infiltrative process (? Sarcoid) -> Check cMRI - Saw Dr. Lalla Brothers 3/24 felt not to be ablation candidate due to obesity. Recommended continue carvedilol as EF stable - PVCs not improve with CPAP - Now on mexilitene 200 bid -> increase to 300 bid - Awaiting cMRI (need insurance approval)   5. Possible AF  - noted on sleep study and AppleWatch - No AF on Zio -> suspect this is likely PVCs  6. OSA, severe - Sleep study 07/24/22: AHI 35.4 Sats down to 75%. Has not started CPAP yet. - discussed role of weight loss - She has seen Dr. Tresa Endo for CPAP optimization. Download reviewed with her today. AHI now < 5  7. Morbid obesity - On ozempic  Total time spent 45 minutes. Over half that time spent discussing above.    Arvilla Meres, MD  10:10 AM

## 2023-03-26 NOTE — H&P (View-Only) (Signed)
ADVANCED HF CLINIC NOTE  Referring Provider: Clarisa Kindred, NP Primary Care: Center, [facility] Primary Cardiologist: Julien Nordmann, MD   HPI:  Heather Norman is a 47 y/o female with a history of arthritis, DM, hyperlipidemia, HTN, morbid obesity and chronic heart failure.   Echo report from 01/26/22 reviewed and showed an EF of 55-60% along with mild LVH and trivial MR. Echo report from 02/10/21 Left ventricular ejection fraction, by estimation, is 50 to 55%. The left ventricle has low normal function. The left ventricle has no regional wall motion abnormalities. Left ventricular diastolic parameters are consistent with Grade I diastolic dysfunction (impaired relaxation).  Zio 10/21 12.5% PVCs Saw EP Lalla Brothers) and suggested possible AAD (flecainide or propafenone) if developed symptoms or LV dysfunction.    Was in the ED 06/07/22 due to SOB and abdominal bloating. Oral diuretic increased and she was released.   Sleep study 07/24/22: AHI 35.4 Sats down to 75%.  Possible AF during study lasting 4.5 hours. Zio placed    Zio patch 1/24  - Sinus rhythm. No AF - 44 runs NSVT (longest 8 beats)  - 21.3% PVCs (3 morphologies 9.7%, 7.3%, 4.3%)  Saw Dr. Lalla Brothers in 3/24. felt not to be ablation candidate due to obesity. Recommended continue carvedilol as EF stable  Saw Dr. Tresa Endo for CPAP titration 4/24. CPAP report shows well controlled OSA  Seen in ER 10/30/22 for CP. Hstrop, ECG and d-Dimer all normal. BNP elevated 47 -> 480 so torsemide increased. .    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   Here for f/u. Feels worse. Having CP frequently. Worse with exertion and eating. Limits her activities. Using GI cocktail, mylanta and TUMS which helps some but still with exertional symptoms. Awaiting approval for EGD. Marland KitchenWearing CPAP but gets cottonmouth. Recently had AKI so metolazone was dropped to once a week. Still with back pain. Breathing ok. No edema. Now on torsemide 80 daily  with metolazone 1x/week. Recent BMET 03/19/23  SCr down to 1.4. Denies palpitations.   Past Medical History:  Diagnosis Date   (HFpEF) heart failure with preserved ejection fraction (HCC)    a. 04/2017 Echo: EF 55-60%, no rwma, Mild MR, mildly dil LA. Nl RV fxn; b. 06/2019 Echo: EF 60-65%, mod LVH. Gr2 DD. Mildly dil LA.   Arthritis    kness/hands - no meds   CHF (congestive heart failure) (HCC)    Diabetes mellitus without complication (HCC)    DM (diabetes mellitus) (HCC) 05/11/2017   Dyspnea on exertion    Headache(784.0)    otc med prn   Heart murmur    Hyperlipidemia    no meds since pregnancy   Hypertension    Morbid obesity (HCC)     Current Outpatient Medications  Medication Sig Dispense Refill   aspirin EC 81 MG tablet Take 81 mg by mouth daily.     atorvastatin (LIPITOR) 20 MG tablet TAKE 1 TABLET(20 MG) BY MOUTH DAILY 90 tablet 3   carvedilol (COREG) 25 MG tablet TAKE 1 TABLET BY MOUTH IN THE MORNING AND 1 AND 1/2 TABLETS IN THE EVENING 225 tablet 3   empagliflozin (JARDIANCE) 25 MG TABS tablet Take 1 tablet (25 mg total) by mouth daily before breakfast. 30 tablet 6   famotidine (PEPCID) 20 MG tablet Take 1 tablet (20 mg total) by mouth 2 (two) times daily.     latanoprost (XALATAN) 0.005 % ophthalmic solution Place 1 drop into both eyes at bedtime.  loratadine (CLARITIN) 10 MG tablet Take 10 mg by mouth daily. Takes daily     metolazone (ZAROXOLYN) 2.5 MG tablet Take 1 tablet (2.5 mg total) by mouth 2 (two) times a week. (Patient taking differently: Take 2.5 mg by mouth 2 (two) times a week. Once wkly now per pt) 8 tablet 5   mexiletine (MEXITIL) 200 MG capsule Take 1 capsule (200 mg total) by mouth 2 (two) times daily. 180 capsule 3   oxybutynin (DITROPAN) 5 MG tablet Take 5 mg by mouth 2 (two) times daily.     pantoprazole (PROTONIX) 40 MG tablet Take 1 tablet (40 mg total) by mouth daily. 90 tablet 3   potassium chloride 20 MEQ/15ML (10%) SOLN Take 45 mLs (60 mEq  total) by mouth daily. And additional on the days you take the metolazone (Patient taking differently: Take 60 mEq by mouth daily. And additional on the days you take the metolazone) 1800 mL 5   sacubitril-valsartan (ENTRESTO) 97-103 MG Take 1 tablet by mouth 2 (two) times daily. 90 tablet 3   Semaglutide,0.25 or 0.5MG /DOS, 2 MG/3ML SOPN Inject 0.25 mg into the skin once a week. 3 mL 5   spironolactone (ALDACTONE) 25 MG tablet TAKE 1 TABLET(25 MG) BY MOUTH DAILY 90 tablet 3   torsemide (DEMADEX) 20 MG tablet Take 4 tablets (80 mg total) by mouth daily. 180 tablet 6   albuterol (VENTOLIN HFA) 108 (90 Base) MCG/ACT inhaler Inhale 1-2 puffs into the lungs every 6 (six) hours as needed. (Patient not taking: Reported on 03/26/2023) 18 g 0   Cholecalciferol (VITAMIN D3) 1.25 MG (50000 UT) CAPS Take 1 capsule by mouth once a week. (Patient not taking: Reported on 03/26/2023)     meclizine (ANTIVERT) 25 MG tablet Take 1 tablet (25 mg total) by mouth 3 (three) times daily as needed for dizziness or nausea. (Patient not taking: Reported on 03/26/2023) 30 tablet 1   ondansetron (ZOFRAN-ODT) 4 MG disintegrating tablet Take 1 tablet (4 mg total) by mouth every 8 (eight) hours as needed for nausea or vomiting. (Patient not taking: Reported on 03/26/2023) 20 tablet 0   No current facility-administered medications for this visit.    Allergies  Allergen Reactions   Penicillins Anaphylaxis    Has patient had a PCN reaction causing immediate rash, facial/tongue/throat swelling, SOB or lightheadedness with hypotension: Yes Has patient had a PCN reaction causing severe rash involving mucus membranes or skin necrosis: No Has patient had a PCN reaction that required hospitalization: No Has patient had a PCN reaction occurring within the last 10 years: No If all of the above answers are "NO", then may proceed with Cephalosporin use.      Social History   Socioeconomic History   Marital status: Single     Spouse name: Not on file   Number of children: 3   Years of education: 12   Highest education level: 12th grade  Occupational History   Not on file  Tobacco Use   Smoking status: Never   Smokeless tobacco: Never  Vaping Use   Vaping status: Never Used  Substance and Sexual Activity   Alcohol use: No   Drug use: No   Sexual activity: Yes    Birth control/protection: Surgical  Other Topics Concern   Not on file  Social History Narrative   Not on file   Social Determinants of Health   Financial Resource Strain: Medium Risk (08/13/2017)   Overall Financial Resource Strain (CARDIA)  Difficulty of Paying Living Expenses: Somewhat hard  Food Insecurity: Food Insecurity Present (08/13/2017)   Hunger Vital Sign    Worried About Running Out of Food in the Last Year: Often true    Ran Out of Food in the Last Year: Sometimes true  Transportation Needs: No Transportation Needs (08/13/2017)   PRAPARE - Administrator, Civil Service (Medical): No    Lack of Transportation (Non-Medical): No  Physical Activity: Sufficiently Active (06/09/2019)   Exercise Vital Sign    Days of Exercise per Week: 5 days    Minutes of Exercise per Session: 150+ min  Stress: Stress Concern Present (08/13/2017)   Heather Norman of Occupational Health - Occupational Stress Questionnaire    Feeling of Stress : Rather much  Social Connections: Moderately Isolated (08/13/2017)   Social Connection and Isolation Panel [NHANES]    Frequency of Communication with Friends and Family: More than three times a week    Frequency of Social Gatherings with Friends and Family: Never    Attends Religious Services: Never    Database administrator or Organizations: No    Attends Banker Meetings: Never    Marital Status: Never married  Intimate Partner Violence: Not At Risk (06/09/2019)   Humiliation, Afraid, Rape, and Kick questionnaire    Fear of Current or Ex-Partner: No    Emotionally Abused: No     Physically Abused: No    Sexually Abused: No      Family History  Problem Relation Age of Onset   Diabetes Mother    Hypertension Mother    Hyperlipidemia Mother    Hypertension Father    Breast cancer Maternal Grandmother 50    Vitals:   03/26/23 0925 03/26/23 0933  BP: (!) 85/50 102/72  Pulse: (!) 30 60  SpO2: 97% 97%  Weight: (!) 307 lb (139.3 kg)   Height: 5\' 5"  (1.651 m)       Wt Readings from Last 3 Encounters:  03/26/23 (!) 307 lb (139.3 kg)  02/20/23 (!) 307 lb 6.4 oz (139.4 kg)  02/19/23 (!) 304 lb 9.6 oz (138.2 kg)    PHYSICAL EXAM: General:  Obese. Well appearing. No resp difficulty HEENT: normal Neck: supple. no JVD. Carotids 2+ bilat; no bruits. No lymphadenopathy or thryomegaly appreciated. Cor: PMI nondisplaced. Irregular rate & rhythm. No rubs, gallops or murmurs. Lungs: clear Abdomen: soft, nontender, nondistended. No hepatosplenomegaly. No bruits or masses. Good bowel sounds. Extremities: no cyanosis, clubbing, rash, edema Neuro: alert & orientedx3, cranial nerves grossly intact. moves all 4 extremities w/o difficulty. Affect pleasant   ECG: Sinus with frequent PVCss Personally reviewed    ASSESSMENT & PLAN:   1. Chest pain, exertional - multiple CRFs - ideally would do chest CTA but I spoke with CT team and unable to get images due to PVCs - Plan R/L cath this week - If cath negative needs EGD  2. 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 BNP 47 -> 580 - NYHA II - Volume status seems improved with recent diuretic titrations - Continue Jardiance 10  - Continue torsemide 40 daily with metolazone once a week - Continue spiro 25 daily - Continue Entresto 97/103 bid - Continue carvedilol 25 bid  - Labs today. - Plan RHC with LHC scheduled or this week    3. HTN - Blood pressure  well  controlled. Continue current regimen.   4. PVC's, frequent - Zio 10/21 12.5% PVCs Saw EP Lalla Brothers) and suggested possible AAD (flecainide or propafenone) if developed symptoms or LV dysfunction.  - Zio patch 1/24. Sinus rhythm. No AF. 44 runs NSVT (longest 8 beats). 21.3% PVCs (3 morphologies 9.7%, 7.3%, 4.3%) - suspect driven by OSA but could aslo be infiltrative process (? Sarcoid) -> Check cMRI - Saw Dr. Lalla Brothers 3/24 felt not to be ablation candidate due to obesity. Recommended continue carvedilol as EF stable - PVCs not improve with CPAP - Now on mexilitene 200 bid -> increase to 300 bid - Awaiting cMRI (need insurance approval)   5. Possible AF  - noted on sleep study and AppleWatch - No AF on Zio -> suspect this is likely PVCs  6. OSA, severe - Sleep study 07/24/22: AHI 35.4 Sats down to 75%. Has not started CPAP yet. - discussed role of weight loss - She has seen Dr. Tresa Endo for CPAP optimization. Download reviewed with her today. AHI now < 5  7. Morbid obesity - On ozempic  Total time spent 45 minutes. Over half that time spent discussing above.    Arvilla Meres, MD  10:10 AM

## 2023-03-26 NOTE — Patient Instructions (Signed)
Medication Changes:  Mexiletine 300 MG twice daily (2 tablets)  Lab Work:  Lab Orders         Brain natriuretic peptide         Basic metabolic panel         CBC      Testing/Procedures:  Your physician has requested that you have a cardiac catheterization. Cardiac catheterization is used to diagnose and/or treat various heart conditions. Doctors may recommend this procedure for a number of different reasons. The most common reason is to evaluate chest pain. Chest pain can be a symptom of coronary artery disease (CAD), and cardiac catheterization can show whether plaque is narrowing or blocking your heart's arteries. This procedure is also used to evaluate the valves, as well as measure the blood flow and oxygen levels in different parts of your heart. For further information please visit [email or web address] Please follow instruction sheet, as given.   [facility] [address] [place] [address] Dept: [phone]  Heather Norman  03/26/2023  You are scheduled for a Cardiac Catheterization on Wednesday, August 28 with Dr. Arvilla Meres.  1. Please arrive at the [facility] Entrance of [facility], [address], [place] [address] at 6:30 AM (This is 1 hour(s) prior to your procedure time).  Proceed to the Check-In Desk directly inside the entrance.  Procedure Parking: Use the entrance off of the [address] side of the hospital. Turn right upon entering and follow the driveway to parking that is directly in front of the [facility] Vascular Center. There is no valet parking available at this entrance, however there is an awning directly in front of the Heart [facility] Vascular Center for drop off/ pick up for patients.  Special note: Every effort is made to have your procedure done on time. Please understand that emergencies sometimes delay scheduled procedures.  2. Diet: Do not eat  solid foods after midnight.  The patient may have clear liquids until 5am upon the day of the procedure.   Ozempic - Don't take ozempic 1 week before your procedure. Call the clinic if you are ready to resume your medication after your procedure.  Please DON'T take your fluid pills (metolazone, torsemide, spironolactone) the day of your procedure.  5. Plan to go home the same day, you will only stay overnight if medically necessary. 6. Bring a current list of your medications and current insurance cards. 7. You MUST have a responsible person to drive you home. 8. Someone MUST be with you the first 24 hours after you arrive home or your discharge will be delayed. 9. Please wear clothes that are easy to get on and off and wear slip-on shoes.  Thank you for allowing [place] to care for you!   -- [place] [facility]   Special Instructions // Education:  Do the following things EVERYDAY: Weigh yourself in the morning before breakfast. Write it down and keep it in a log. Take your medicines as prescribed Eat low salt foods--Limit salt (sodium) to 2000 mg per day.  Stay as active as you can everyday Limit all fluids for the day to less than 2 liters   Follow-Up in: 1 week after cath   If you have any questions or concerns before your next appointment please send [place] a message through mychart or call our office at [phone] Monday-Friday 8 am-5 pm.   If you have  an urgent need after hours on the weekend please call your Primary Cardiologist or the [facility] in [place] at [phone].

## 2023-03-27 ENCOUNTER — Telehealth (HOSPITAL_COMMUNITY): Payer: Self-pay | Admitting: *Deleted

## 2023-03-27 ENCOUNTER — Other Ambulatory Visit: Payer: Self-pay | Admitting: Physician Assistant

## 2023-03-27 ENCOUNTER — Emergency Department
Admission: EM | Admit: 2023-03-27 | Discharge: 2023-03-27 | Disposition: A | Discharge status: Home / Self Care | Payer: Managed Care, Other (non HMO) | Attending: Emergency Medicine | Admitting: Emergency Medicine

## 2023-03-27 ENCOUNTER — Other Ambulatory Visit: Payer: Self-pay

## 2023-03-27 ENCOUNTER — Telehealth: Payer: Self-pay

## 2023-03-27 ENCOUNTER — Other Ambulatory Visit (HOSPITAL_COMMUNITY): Payer: Self-pay | Admitting: *Deleted

## 2023-03-27 DIAGNOSIS — E876 Hypokalemia: Secondary | ICD-10-CM

## 2023-03-27 DIAGNOSIS — R0789 Other chest pain: Secondary | ICD-10-CM

## 2023-03-27 DIAGNOSIS — I5032 Chronic diastolic (congestive) heart failure: Secondary | ICD-10-CM

## 2023-03-27 LAB — MAGNESIUM: Magnesium: 2.3 mg/dL (ref 1.7–2.4)

## 2023-03-27 LAB — TROPONIN I (HIGH SENSITIVITY): Troponin I (High Sensitivity): 8 ng/L (ref <18)

## 2023-03-27 MED ORDER — LIDOCAINE 5 % EX PTCH
1.0000 | MEDICATED_PATCH | CUTANEOUS | Status: DC
  Administered 2023-03-27: 1 via TRANSDERMAL
  Filled 2023-03-27: qty 1

## 2023-03-27 MED ORDER — POTASSIUM CHLORIDE 20 MEQ PO PACK
60.0000 meq | PACK | Freq: Once | ORAL | Status: AC
  Administered 2023-03-27: 60 meq via ORAL
  Filled 2023-03-27: qty 3

## 2023-03-27 MED ORDER — ALUM & MAG HYDROXIDE-SIMETH 200-200-20 MG/5ML PO SUSP
30.0000 mL | Freq: Once | ORAL | Status: AC
  Administered 2023-03-27: 30 mL via ORAL
  Filled 2023-03-27: qty 30

## 2023-03-27 MED ORDER — POTASSIUM CHLORIDE 10 MEQ/100ML IV SOLN
10.0000 meq | Freq: Once | INTRAVENOUS | Status: AC
  Administered 2023-03-27: 10 meq via INTRAVENOUS
  Filled 2023-03-27: qty 100

## 2023-03-27 MED ORDER — POTASSIUM CHLORIDE CRYS ER 20 MEQ PO TBCR: 40.0000 meq | EXTENDED_RELEASE_TABLET | Freq: Once | ORAL | Status: DC

## 2023-03-27 MED ORDER — POTASSIUM CHLORIDE 20 MEQ/15ML (10%) PO SOLN
60.0000 meq | Freq: Every day | ORAL | Status: DC
Start: 2023-03-27 — End: 2023-10-12

## 2023-03-27 MED ORDER — ACETAMINOPHEN 500 MG PO TABS
1000.0000 mg | ORAL_TABLET | Freq: Once | ORAL | Status: AC
  Administered 2023-03-27: 1000 mg via ORAL
  Filled 2023-03-27: qty 2

## 2023-03-27 MED ORDER — SUCRALFATE 1 G PO TABS
1.0000 g | ORAL_TABLET | Freq: Once | ORAL | Status: AC
  Administered 2023-03-27: 1 g via ORAL
  Filled 2023-03-27: qty 1

## 2023-03-27 NOTE — Telephone Encounter (Signed)
Called patient per Heather Rome, NP with following lab results and instructions:  "K is low. Please call and confirm her KCL dosing, looks like she has been taking 60 mEq daily (needs to take extra 60 on metolazone days)."  Pt confirms she saw message in MyChart as well. She says she was in ED last night with chest pain and low potassium continued. She says she was given IV potassium as well.  She does confirm she will be taking extra 60 meq potassium on metolazone days.  She is having heart catheterization here tomorrow and has f/u scheduled with Dr. Gasper Lloyd on 04/04/23 at 10:15 am. Asked her to call us at [phone] if any questions or concerns prior to next visit.

## 2023-03-27 NOTE — ED Provider Notes (Signed)
[facility] Provider Note    Event Date/Time   First MD Initiated Contact with Patient 03/27/23 0109     (approximate)   History   Chest Pain   HPI  Heather Norman is a 47 y.o. female who presents to the ED for evaluation of Chest Pain   I reviewed cardiology clinic visit from less than 24 hours ago.  Chronic diastolic dysfunction, BMI over 50, HTN, HLD and DM.  Patient presents for evaluation of chronic intermittent chest pain for greater than 1 month.  Also reporting atraumatic pain on her right side without rash.  Reports compliance with her medications without dyspnea or lower extremity swelling.  No fevers or cough.  She also reports some chest burning alongside some left-sided parasternal sharp chest pain.  Reports this feels like her GERD as well, reports compliance with famotidine.   Physical Exam   Triage Vital Signs: ED Triage Vitals [03/26/23 2329]  Encounter Vitals Group     BP (!) 117/58     Systolic BP Percentile      Diastolic BP Percentile      Pulse Rate (!) 56     Resp 18     Temp 97.9 F (36.6 C)     Temp src      SpO2 100 %     Weight      Height      Head Circumference      Peak Flow      Pain Score 6     Pain Loc      Pain Education      Exclude from Growth Chart     Most recent vital signs: Vitals:   03/27/23 0230 03/27/23 0316  BP: (!) 106/40   Pulse: 84   Resp: (!) 21   Temp:  98.1 F (36.7 C)  SpO2: 100%     General: Awake, no distress.  Morbidly obese, well-appearing and conversational CV:  Good peripheral perfusion.  Resp:  Normal effort.  Abd:  No distention.  Soft and benign MSK:  No deformity noted.  No pitting edema Neuro:  No focal deficits appreciated. Other:  Chest pain is reproducible without overlying skin changes.  Also has tenderness of her right lateral rib cage but has a benign abdomen beneath this.   ED Results / Procedures / Treatments   Labs (all labs ordered are listed, but  only abnormal results are displayed) Labs Reviewed  URINALYSIS, ROUTINE W REFLEX MICROSCOPIC - Abnormal; Notable for the following components:      Result Value   Color, Urine STRAW (*)    APPearance CLEAR (*)    Glucose, UA >=500 (*)    All other components within normal limits  COMPREHENSIVE METABOLIC PANEL - Abnormal; Notable for the following components:   Potassium 2.9 (*)    Glucose, Bld 211 (*)    BUN 24 (*)    Creatinine, Ser 1.53 (*)    Calcium 8.7 (*)    GFR, Estimated 42 (*)    All other components within normal limits  LIPASE, BLOOD - Abnormal; Notable for the following components:   Lipase 55 (*)    All other components within normal limits  CBC WITH DIFFERENTIAL/PLATELET  MAGNESIUM  POC URINE PREG, ED  TROPONIN I (HIGH SENSITIVITY)  TROPONIN I (HIGH SENSITIVITY)    EKG Sinus rhythm with a rate of 78 bpm.  4 PVCs.  Nonspecific ST changes inferiorly and laterally without STEMI.  RADIOLOGY 2  view CXR interpreted by me with cardiomegaly and pulmonary vascular congestion  Official radiology report(s): DG Chest 2 View  Result Date: 03/26/2023 CLINICAL DATA:  Chest pain since July 23rd. Chronic gastrointestinal reflux disease. Dysuria and right flank pain. EXAM: CHEST - 2 VIEW COMPARISON:  01/05/2023 FINDINGS: Shallow inspiration. Cardiac enlargement with pulmonary vascular congestion. No edema or consolidation. No pleural effusions. No focal consolidation. Mediastinal contours appear intact. Similar appearance to previous study. IMPRESSION: Cardiac enlargement with pulmonary vascular congestion. No edema or consolidation. Electronically Signed   By: Burman Nieves M.D.   On: 03/26/2023 23:54    PROCEDURES and INTERVENTIONS:  .1-3 Lead EKG Interpretation  Performed by: Delton Prairie, MD Authorized by: Delton Prairie, MD     Interpretation: normal     ECG rate:  60   ECG rate assessment: normal     Rhythm: sinus rhythm     Ectopy: PVCs     Conduction: normal      Medications  lidocaine (LIDODERM) 5 % 1 patch (1 patch Transdermal Patch Applied 03/27/23 0207)  acetaminophen (TYLENOL) tablet 1,000 mg (1,000 mg Oral Given 03/27/23 0206)  potassium chloride 10 mEq in 100 mL IVPB (10 mEq Intravenous New Bag/Given 03/27/23 0212)  potassium chloride (KLOR-CON) packet 60 mEq (60 mEq Oral Given 03/27/23 0206)  alum & mag hydroxide-simeth (MAALOX/MYLANTA) 200-200-20 MG/5ML suspension 30 mL (30 mLs Oral Given 03/27/23 0207)  sucralfate (CARAFATE) tablet 1 g (1 g Oral Given 03/27/23 0206)     IMPRESSION / MDM / ASSESSMENT AND PLAN / ED COURSE  I reviewed the triage vital signs and the nursing notes.  Differential diagnosis includes, but is not limited to, ACS, PTX, PNA, muscle strain/spasm, PE, dissection, anxiety, pleural effusion  {Patient presents with symptoms of an acute illness or injury that is potentially life-threatening.  Patient presents to the ED for the ration of chronic chest pain.  EKG near baseline and first troponin is negative.  We will trend this for at least a second.  CKD near baseline on metabolic panel but also with acute hypokalemia that we will replace orally and IV.  Normal CBC and urine without infectious features.  Possibly MSK pain considering is reproducible on palpation in her low potassium.    Clinical Course as of 03/27/23 0400  Tue Mar 27, 2023  0358 Reassessed.  Feeling better.  Discussed reassuring workup and plan of care, she is comfortable going home and I think this is reasonable considering the chronicity of her symptoms.  We discussed cardiology follow-up and close return precautions [DS]    Clinical Course User Index [DS] Delton Prairie, MD     FINAL CLINICAL IMPRESSION(S) / ED DIAGNOSES   Final diagnoses:  Other chest pain  Hypokalemia     Rx / DC Orders   ED Discharge Orders     None        Note:  This document was prepared using Dragon voice recognition software and may include unintentional  dictation errors.   Delton Prairie, MD 03/27/23 0400

## 2023-03-27 NOTE — Progress Notes (Signed)
Orders for [facility] placed.

## 2023-03-27 NOTE — Telephone Encounter (Signed)
Spoke with Heather Norman regarding insurance auth case is still Pending and tomorrow is her Right and left cath.  Spoke with Heather Norman to ensure she was ok with rescheduling procedure at this time. Heather Norman aware, agreeable, and verbalized understanding.  Heather Norman stated the week of Sept 16th will be best for her. As she will also will need to cancel and reschedule her EGD.

## 2023-03-28 ENCOUNTER — Ambulatory Visit
Admission: RE | Admit: 2023-03-28 | Discharge: 2023-03-28 | Disposition: A | Discharge status: Home / Self Care | Payer: Managed Care, Other (non HMO) | Attending: Internal Medicine | Admitting: Internal Medicine

## 2023-03-28 ENCOUNTER — Other Ambulatory Visit: Payer: Self-pay

## 2023-03-28 ENCOUNTER — Encounter
Admission: RE | Disposition: A | Discharge status: Home / Self Care | Payer: Self-pay | Source: Home / Self Care | Attending: Internal Medicine

## 2023-03-28 ENCOUNTER — Encounter: Payer: Self-pay | Admitting: Internal Medicine

## 2023-03-28 ENCOUNTER — Telehealth: Payer: Self-pay | Admitting: Cardiology

## 2023-03-28 DIAGNOSIS — E119 Type 2 diabetes mellitus without complications: Secondary | ICD-10-CM | POA: Insufficient documentation

## 2023-03-28 DIAGNOSIS — I110 Hypertensive heart disease with heart failure: Secondary | ICD-10-CM | POA: Insufficient documentation

## 2023-03-28 DIAGNOSIS — E785 Hyperlipidemia, unspecified: Secondary | ICD-10-CM | POA: Diagnosis not present

## 2023-03-28 DIAGNOSIS — I5032 Chronic diastolic (congestive) heart failure: Secondary | ICD-10-CM | POA: Diagnosis not present

## 2023-03-28 DIAGNOSIS — Z79899 Other long term (current) drug therapy: Secondary | ICD-10-CM | POA: Insufficient documentation

## 2023-03-28 DIAGNOSIS — Z6841 Body Mass Index (BMI) 40.0 and over, adult: Secondary | ICD-10-CM | POA: Insufficient documentation

## 2023-03-28 DIAGNOSIS — I493 Ventricular premature depolarization: Secondary | ICD-10-CM | POA: Insufficient documentation

## 2023-03-28 DIAGNOSIS — R079 Chest pain, unspecified: Secondary | ICD-10-CM | POA: Insufficient documentation

## 2023-03-28 DIAGNOSIS — G4733 Obstructive sleep apnea (adult) (pediatric): Secondary | ICD-10-CM | POA: Diagnosis not present

## 2023-03-28 HISTORY — PX: RIGHT/LEFT HEART CATH AND CORONARY ANGIOGRAPHY: CATH118266

## 2023-03-28 LAB — POCT I-STAT EG7
Acid-base deficit: 1 mmol/L (ref 0.0–2.0)
Acid-base deficit: 7 mmol/L — ABNORMAL HIGH (ref 0.0–2.0)
Bicarbonate: 18.9 mmol/L — ABNORMAL LOW (ref 20.0–28.0)
Bicarbonate: 24.8 mmol/L (ref 20.0–28.0)
Calcium, Ion: 0.78 mmol/L — CL (ref 1.15–1.40)
Calcium, Ion: 1.25 mmol/L (ref 1.15–1.40)
HCT: 33 % — ABNORMAL LOW (ref 36.0–46.0)
HCT: 39 % (ref 36.0–46.0)
Hemoglobin: 11.2 g/dL — ABNORMAL LOW (ref 12.0–15.0)
Hemoglobin: 13.3 g/dL (ref 12.0–15.0)
O2 Saturation: 71 %
O2 Saturation: 73 %
Potassium: 2.6 mmol/L — CL (ref 3.5–5.1)
Potassium: 3.8 mmol/L (ref 3.5–5.1)
Sodium: 139 mmol/L (ref 135–145)
Sodium: 140 mmol/L (ref 135–145)
TCO2: 20 mmol/L — ABNORMAL LOW (ref 22–32)
TCO2: 26 mmol/L (ref 22–32)
pCO2, Ven: 40 mmHg — ABNORMAL LOW (ref 44–60)
pCO2, Ven: 44.4 mmHg (ref 44–60)
pH, Ven: 7.282 (ref 7.25–7.43)
pH, Ven: 7.355 (ref 7.25–7.43)
pO2, Ven: 40 mmHg (ref 32–45)
pO2, Ven: 42 mmHg (ref 32–45)

## 2023-03-28 LAB — POCT I-STAT 7, (LYTES, BLD GAS, ICA,H+H)
Acid-base deficit: 3 mmol/L — ABNORMAL HIGH (ref 0.0–2.0)
Bicarbonate: 22.2 mmol/L (ref 20.0–28.0)
Calcium, Ion: 1.01 mmol/L — ABNORMAL LOW (ref 1.15–1.40)
HCT: 35 % — ABNORMAL LOW (ref 36.0–46.0)
Hemoglobin: 11.9 g/dL — ABNORMAL LOW (ref 12.0–15.0)
O2 Saturation: 96 %
Potassium: 3.3 mmol/L — ABNORMAL LOW (ref 3.5–5.1)
Sodium: 145 mmol/L (ref 135–145)
TCO2: 23 mmol/L (ref 22–32)
pCO2 arterial: 38.3 mmHg (ref 32–48)
pH, Arterial: 7.372 (ref 7.35–7.45)
pO2, Arterial: 84 mmHg (ref 83–108)

## 2023-03-28 LAB — GLUCOSE, CAPILLARY
Glucose-Capillary: 165 mg/dL — ABNORMAL HIGH (ref 70–99)
Glucose-Capillary: 144 mg/dL — ABNORMAL HIGH (ref 70–99)

## 2023-03-28 SURGERY — RIGHT/LEFT HEART CATH AND CORONARY ANGIOGRAPHY
Anesthesia: Moderate Sedation

## 2023-03-28 MED ORDER — MIDAZOLAM HCL 2 MG/2ML IJ SOLN
INTRAMUSCULAR | Status: AC
  Filled 2023-03-28: qty 2

## 2023-03-28 MED ORDER — IOHEXOL 300 MG/ML  SOLN
INTRAMUSCULAR | Status: DC | PRN
  Administered 2023-03-28: 87 mL

## 2023-03-28 MED ORDER — VERAPAMIL HCL 2.5 MG/ML IV SOLN
INTRAVENOUS | Status: AC
  Filled 2023-03-28: qty 2

## 2023-03-28 MED ORDER — LIDOCAINE HCL 1 % IJ SOLN
INTRAMUSCULAR | Status: AC
  Filled 2023-03-28: qty 20

## 2023-03-28 MED ORDER — HEPARIN SODIUM (PORCINE) 1000 UNIT/ML IJ SOLN
INTRAMUSCULAR | Status: DC | PRN
  Administered 2023-03-28: 6000 [IU] via INTRAVENOUS

## 2023-03-28 MED ORDER — FENTANYL CITRATE (PF) 100 MCG/2ML IJ SOLN
INTRAMUSCULAR | Status: AC
  Filled 2023-03-28: qty 2

## 2023-03-28 MED ORDER — LIDOCAINE HCL (PF) 1 % IJ SOLN
INTRAMUSCULAR | Status: DC | PRN
  Administered 2023-03-28: 8 mL
  Administered 2023-03-28: 10 mL

## 2023-03-28 MED ORDER — ASPIRIN 81 MG PO CHEW
CHEWABLE_TABLET | ORAL | Status: AC
  Filled 2023-03-28: qty 1

## 2023-03-28 MED ORDER — HEPARIN (PORCINE) IN NACL 1000-0.9 UT/500ML-% IV SOLN
INTRAVENOUS | Status: DC | PRN
  Administered 2023-03-28 (×2): 500 mL

## 2023-03-28 MED ORDER — HEPARIN (PORCINE) IN NACL 1000-0.9 UT/500ML-% IV SOLN
INTRAVENOUS | Status: AC
  Filled 2023-03-28: qty 1000

## 2023-03-28 MED ORDER — MIDAZOLAM HCL 2 MG/2ML IJ SOLN
INTRAMUSCULAR | Status: DC | PRN
  Administered 2023-03-28 (×3): 1 mg via INTRAVENOUS

## 2023-03-28 MED ORDER — VERAPAMIL HCL 2.5 MG/ML IV SOLN: INTRAVENOUS | Status: DC | PRN

## 2023-03-28 MED ORDER — FENTANYL CITRATE (PF) 100 MCG/2ML IJ SOLN
INTRAMUSCULAR | Status: DC | PRN
  Administered 2023-03-28: 25 ug via INTRAVENOUS

## 2023-03-28 MED ORDER — HEPARIN SODIUM (PORCINE) 1000 UNIT/ML IJ SOLN
INTRAMUSCULAR | Status: AC
  Filled 2023-03-28: qty 10

## 2023-03-28 MED ORDER — ASPIRIN 81 MG PO CHEW
81.0000 mg | CHEWABLE_TABLET | ORAL | Status: AC
  Administered 2023-03-28: 81 mg via ORAL

## 2023-03-28 MED ORDER — SODIUM CHLORIDE 0.9 % IV SOLN: INTRAVENOUS | Status: DC

## 2023-03-28 SURGICAL SUPPLY — 21 items
CANNULA 5F STIFF (CANNULA) IMPLANT
CATH 5FR JL3.5 JR4 ANG PIG MP (CATHETERS) IMPLANT
CATH INFINITI 5FR AL1 (CATHETERS) IMPLANT
CATH INFINITI 5FR JL4 (CATHETERS) IMPLANT
CATH INFINITI AMBI 5FR TG (CATHETERS) IMPLANT
CATH LAUNCHER 5F EBU3.0 (CATHETERS) IMPLANT
CATH SWAN GANZ 7F STRAIGHT (CATHETERS) IMPLANT
CATHETER LAUNCHER 5F EBU3.0 (CATHETERS) ×1
DEVICE RAD TR BAND REGULAR (VASCULAR PRODUCTS) IMPLANT
DRAPE BRACHIAL (DRAPES) IMPLANT
GLIDESHEATH SLEND SS 6F .021 (SHEATH) IMPLANT
GUIDEWIRE INQWIRE 1.5J.035X260 (WIRE) IMPLANT
INQWIRE 1.5J .035X260CM (WIRE) ×1
KIT SYRINGE INJ CVI SPIKEX1 (MISCELLANEOUS) IMPLANT
PACK CARDIAC CATH (CUSTOM PROCEDURE TRAY) IMPLANT
PANNUS RETENTION SYSTEM 2 PAD (MISCELLANEOUS) IMPLANT
PROTECTION STATION PRESSURIZED (MISCELLANEOUS) ×1
SET ATX-X65L (MISCELLANEOUS) IMPLANT
SHEATH AVANTI 7FRX11 (SHEATH) IMPLANT
SHEATH GLIDE SLENDER 4/5FR (SHEATH) IMPLANT
STATION PROTECTION PRESSURIZED (MISCELLANEOUS) IMPLANT

## 2023-03-28 NOTE — Discharge Instructions (Addendum)
Radial Site Care Refer to this sheet in the next few weeks. These instructions provide you with information about caring for yourself after your procedure. Your health care provider may also give you more specific instructions. Your treatment has been planned according to current medical practices, but problems sometimes occur. Call your health care provider if you have any problems or questions after your procedure. What can I expect after the procedure? After your procedure, it is typical to have the following: Bruising at the radial site that usually fades within 1-2 weeks. Blood collecting in the tissue (hematoma) that may be painful to the touch. It should usually decrease in size and tenderness within 1-2 weeks.  Follow these instructions at home: Take medicines only as directed by your health care provider. If you are on a medication called Metformin please do not take for 48 hours after your procedure. Over the next 48hrs please increase your fluid intake of water and non caffeine beverages to flush the contrast dye out of your system.  You may shower 24 hours after the procedure  Leave your bandage on and gently wash the site with plain soap and water. Pat the area dry with a clean towel. Do not rub the site, because this may cause bleeding.  Remove your dressing 48hrs after your procedure and leave open to air.  Do not submerge your site in water for 7 days. This includes swimming and washing dishes.  Check your insertion site every day for redness, swelling, or drainage. Do not apply powder or lotion to the site. Do not flex or bend the affected arm for 24 hours or as directed by your health care provider. Do not push or pull heavy objects with the affected arm for 24 hours or as directed by your health care provider. Do not lift over 10 lb (4.5 kg) for 5 days after your procedure or as directed by your health care provider. Ask your health care provider when it is okay to: Return to  work or school. Resume usual physical activities or sports. Resume sexual activity. Do not drive home if you are discharged the same day as the procedure. Have someone else drive you. You may drive 48 hours after the procedure Do not operate machinery or power tools for 24 hours after the procedure. If your procedure was done as an outpatient procedure, which means that you went home the same day as your procedure, a responsible adult should be with you for the first 24 hours after you arrive home. Keep all follow-up visits as directed by your health care provider. This is important. Contact a health care provider if: You have a fever. You have chills. You have increased bleeding from the radial site. Hold pressure on the site. Get help right away if: You have unusual pain at the radial site. You have redness, warmth, or swelling at the radial site. You have drainage (other than a small amount of blood on the dressing) from the radial site. The radial site is bleeding, and the bleeding does not stop after 15 minutes of holding steady pressure on the site. Your arm or hand becomes pale, cool, tingly, or numb. This information is not intended to replace advice given to you by your health care provider. Make sure you discuss any questions you have with your health care provider. Document Released: 08/19/2010 Document Revised: 12/23/2015 Document Reviewed: 02/02/2014 Elsevier Interactive Patient Education  2018 [employer]Right Heart Cath, Care After This sheet gives you information about  how to care for yourself after your procedure. Your health care provider may also give you more specific instructions. If you have problems or questions, contact your health care provider. What can I expect after the procedure? After the procedure, it is common to have: Bruising or mild discomfort in the area where the IV was inserted (insertion site). Follow these instructions at home: Eating and  drinking  You may eat and drink after your procedure.  Drink a lot of fluids for the first several days after the procedure, as directed by your health care provider. This helps to wash (flush) the contrast out of your body. Examples of healthy fluids include water or low-calorie drinks. General instructions Check your IV insertion area and also your venous access site every day for signs of infection. Check for: Redness, swelling, or pain. Fluid or blood. Warmth. Pus or a bad smell. Take over-the-counter and prescription medicines only as told by your health care provider. Rest and return to your normal activities as told by your health care provider. Ask your health care provider what activities are safe for you. Do not drive for 24 hours if you were given a medicine to help you relax (sedative), or until your health care provider approves. Keep all follow-up visits as told by your health care provider. This is important. Contact a health care provider if: Your skin becomes itchy or you develop a rash or hives. You have a fever that does not get better with medicine. You feel nauseous. You vomit. You have redness, swelling, or pain around the insertion site. You have fluid or blood coming from the insertion site. Your insertion area feels warm to the touch. You have pus or a bad smell coming from the insertion site. Get help right away if: You have difficulty breathing or shortness of breath. You develop chest pain. You faint. You feel very dizzy. These symptoms may represent a serious problem that is an emergency. Do not wait to see if the symptoms will go away. Get medical help right away. Call your local emergency services (911 in the [place] Do not drive yourself to the hospital. Summary After your procedure, it is common to have bruising or mild discomfort in the area where the IV was inserted. You should check your IV insertion area every day for signs of infection. Take  over-the-counter and prescription medicines only as told by your health care provider. You should drink a lot of fluids for the first several days after the procedure to help flush the contrast from your body. This information is not intended to replace advice given to you by your health care provider. Make sure you discuss any questions you have with your health care provider. Document Released: 05/07/2013 Document Revised: 06/29/2017 Document Reviewed: 06/10/2016 Elsevier Patient Education  2020 [employer].

## 2023-03-28 NOTE — Interval H&P Note (Signed)
History and Physical Interval Note:  03/28/2023 7:46 AM  Heather Norman  has presented today for surgery, with the diagnosis of unstable angina with heart failure with preserved left ventricular function.  The various methods of treatment have been discussed with the patient and family. After consideration of risks, benefits and other options for treatment, the patient has consented to  Procedure(s): RIGHT/LEFT HEART CATH AND CORONARY ANGIOGRAPHY (N/A) as a surgical intervention.  The patient's history has been reviewed, patient examined, no change in status, stable for surgery.  I have reviewed the patient's chart and labs.  Questions were answered to the patient's satisfaction.     Emiliya Chretien

## 2023-03-28 NOTE — Telephone Encounter (Signed)
[employer] is calling to inform us that the MRI order was denied. [employer] is wanting to make sure the doctor is aware.

## 2023-03-29 ENCOUNTER — Encounter: Payer: Self-pay | Admitting: Internal Medicine

## 2023-03-30 ENCOUNTER — Telehealth: Payer: Self-pay

## 2023-03-30 NOTE — Telephone Encounter (Signed)
Pt called to ask if she can continue her medications now that she has had her cath completed.   Spoke with Dr. Gala Romney, [facility] per him for pt to start back on medications. Pt agreed, had good understanding and no further questions.

## 2023-04-03 ENCOUNTER — Encounter: Payer: Self-pay | Admitting: Certified Registered"

## 2023-04-03 ENCOUNTER — Ambulatory Visit
Admission: RE | Admit: 2023-04-03 | Payer: Managed Care, Other (non HMO) | Source: Home / Self Care | Admitting: Gastroenterology

## 2023-04-03 ENCOUNTER — Encounter: Admission: RE | Payer: Self-pay | Source: Home / Self Care

## 2023-04-03 SURGERY — ESOPHAGOGASTRODUODENOSCOPY (EGD) WITH PROPOFOL
Anesthesia: General

## 2023-04-04 ENCOUNTER — Ambulatory Visit: Payer: Managed Care, Other (non HMO) | Attending: Cardiology | Admitting: Cardiology

## 2023-04-04 VITALS — BP 103/58 | HR 66 | Wt 307.0 lb

## 2023-04-04 DIAGNOSIS — F410 Panic disorder [episodic paroxysmal anxiety] without agoraphobia: Secondary | ICD-10-CM

## 2023-04-04 DIAGNOSIS — R002 Palpitations: Secondary | ICD-10-CM

## 2023-04-04 DIAGNOSIS — I5032 Chronic diastolic (congestive) heart failure: Secondary | ICD-10-CM | POA: Diagnosis not present

## 2023-04-04 NOTE — Progress Notes (Signed)
ADVANCED HF CLINIC NOTE  Referring Provider: Clarisa Kindred, NP Primary Care: [facility] Primary Cardiologist: Julien Nordmann, MD   HPI:  Heather Norman is a 47 y/o female with a history of arthritis, DM, hyperlipidemia, HTN, morbid obesity and chronic heart failure.   Echo report from 01/26/22 reviewed and showed an EF of 55-60% along with mild LVH and trivial MR. Echo report from 02/10/21 Left ventricular ejection fraction, by estimation, is 50 to 55%. The left ventricle has low normal function. The left ventricle has no regional wall motion abnormalities. Left ventricular diastolic parameters are consistent with Grade I diastolic dysfunction (impaired relaxation).  Zio 10/21 12.5% PVCs Saw EP Lalla Brothers) and suggested possible AAD (flecainide or propafenone) if developed symptoms or LV dysfunction.    Was in the ED 06/07/22 due to SOB and abdominal bloating. Oral diuretic increased and she was released.   Sleep study 07/24/22: AHI 35.4 Sats down to 75%.  Possible AF during study lasting 4.5 hours. Zio placed    Zio patch 1/24  - Sinus rhythm. No AF - 44 runs NSVT (longest 8 beats)  - 21.3% PVCs (3 morphologies 9.7%, 7.3%, 4.3%)  Saw Dr. Lalla Brothers in 3/24. felt not to be ablation candidate due to obesity. Recommended continue carvedilol as EF stable  Saw Dr. Tresa Endo for CPAP titration 4/24. CPAP report shows well controlled OSA  Seen in ER 10/30/22 for CP. Hstrop, ECG and d-Dimer all normal. BNP elevated 47 -> 480 so torsemide increased. .    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   She was scheduled to have an EGD yesterday for intolerable acid reflux and dyspepsia. Unfortunately, she canceled it due panic attacks and fear of going to sleep. No issues with SOB, LE edema, PND or orthopnea. She is compliant with her CPAP. She feels that her current combination of diuretics and heart failure medications are doing well. Currently taking torsemide 80mg  daily with  metolazone once week.   Past Medical History:  Diagnosis Date   (HFpEF) heart failure with preserved ejection fraction (HCC)    a. 04/2017 Echo: EF 55-60%, no rwma, Mild MR, mildly dil LA. Nl RV fxn; b. 06/2019 Echo: EF 60-65%, mod LVH. Gr2 DD. Mildly dil LA.   Arthritis    kness/hands - no meds   CHF (congestive heart failure) (HCC)    Diabetes mellitus without complication (HCC)    DM (diabetes mellitus) (HCC) 05/11/2017   Dyspnea on exertion    Headache(784.0)    otc med prn   Heart murmur    Hyperlipidemia    no meds since pregnancy   Hypertension    Morbid obesity (HCC)     Current Outpatient Medications  Medication Sig Dispense Refill   albuterol (VENTOLIN HFA) 108 (90 Base) MCG/ACT inhaler Inhale 1-2 puffs into the lungs every 6 (six) hours as needed. (Patient not taking: Reported on 03/26/2023) 18 g 0   aluminum-magnesium hydroxide 200-200 MG/5ML suspension Take 15 mLs by mouth every 6 (six) hours as needed for indigestion.     aspirin EC 81 MG tablet Take 81 mg by mouth daily.     atorvastatin (LIPITOR) 20 MG tablet TAKE 1 TABLET(20 MG) BY MOUTH DAILY 90 tablet 3   carvedilol (COREG) 25 MG tablet TAKE 1 TABLET BY MOUTH IN THE MORNING AND 1 AND 1/2 TABLETS IN THE EVENING 225 tablet 3   Cholecalciferol (VITAMIN D3) 1.25 MG (50000 UT) CAPS Take 1 capsule by mouth once a week. (Patient  not taking: Reported on 03/26/2023)     empagliflozin (JARDIANCE) 25 MG TABS tablet Take 1 tablet (25 mg total) by mouth daily before breakfast. 30 tablet 6   famotidine (PEPCID) 20 MG tablet Take 1 tablet (20 mg total) by mouth 2 (two) times daily.     famotidine-calcium carbonate-magnesium hydroxide (PEPCID COMPLETE) 10-800-165 MG chewable tablet Chew 1 tablet by mouth daily as needed.     latanoprost (XALATAN) 0.005 % ophthalmic solution Place 1 drop into both eyes at bedtime.     loratadine (CLARITIN) 10 MG tablet Take 10 mg by mouth daily. Takes daily     meclizine (ANTIVERT) 25 MG tablet Take  1 tablet (25 mg total) by mouth 3 (three) times daily as needed for dizziness or nausea. (Patient not taking: Reported on 03/26/2023) 30 tablet 1   metolazone (ZAROXOLYN) 2.5 MG tablet Take 1 tablet (2.5 mg total) by mouth 2 (two) times a week. (Patient not taking: Reported on 03/28/2023) 8 tablet 5   mexiletine (MEXITIL) 150 MG capsule Take 2 capsules (300 mg total) by mouth 2 (two) times daily. 360 capsule 3   ondansetron (ZOFRAN-ODT) 4 MG disintegrating tablet Take 1 tablet (4 mg total) by mouth every 8 (eight) hours as needed for nausea or vomiting. (Patient not taking: Reported on 03/26/2023) 20 tablet 0   oxybutynin (DITROPAN) 5 MG tablet Take 5 mg by mouth 2 (two) times daily.     pantoprazole (PROTONIX) 40 MG tablet Take 1 tablet (40 mg total) by mouth daily. 90 tablet 3   potassium chloride 20 MEQ/15ML (10%) SOLN Take 45 mLs (60 mEq total) by mouth daily. And additional on the days you take the metolazone     sacubitril-valsartan (ENTRESTO) 97-103 MG Take 1 tablet by mouth 2 (two) times daily. 90 tablet 3   Semaglutide,0.25 or 0.5MG /DOS, 2 MG/3ML SOPN Inject 0.25 mg into the skin once a week. (Patient not taking: Reported on 03/28/2023) 3 mL 5   spironolactone (ALDACTONE) 25 MG tablet TAKE 1 TABLET(25 MG) BY MOUTH DAILY (Patient not taking: Reported on 03/28/2023) 90 tablet 3   torsemide (DEMADEX) 20 MG tablet Take 4 tablets (80 mg total) by mouth daily. (Patient not taking: Reported on 03/28/2023) 180 tablet 6   No current facility-administered medications for this visit.    Allergies  Allergen Reactions   Penicillins Anaphylaxis    Has patient had a PCN reaction causing immediate rash, facial/tongue/throat swelling, SOB or lightheadedness with hypotension: Yes Has patient had a PCN reaction causing severe rash involving mucus membranes or skin necrosis: No Has patient had a PCN reaction that required hospitalization: No Has patient had a PCN reaction occurring within the last 10  years: No If all of the above answers are "NO", then may proceed with Cephalosporin use.      Social History   Socioeconomic History   Marital status: Single    Spouse name: Not on file   Number of children: 3   Years of education: 12   Highest education level: 12th grade  Occupational History   Not on file  Tobacco Use   Smoking status: Never   Smokeless tobacco: Never  Vaping Use   Vaping status: Never Used  Substance and Sexual Activity   Alcohol use: No   Drug use: No   Sexual activity: Yes    Birth control/protection: Surgical  Other Topics Concern   Not on file  Social History Narrative   Not on file   Social Determinants  of Health   Financial Resource Strain: Medium Risk (08/13/2017)   Overall Financial Resource Strain (CARDIA)    Difficulty of Paying Living Expenses: Somewhat hard  Food Insecurity: Food Insecurity Present (08/13/2017)   Hunger Vital Sign    Worried About Running Out of Food in the Last Year: Often true    Ran Out of Food in the Last Year: Sometimes true  Transportation Needs: No Transportation Needs (08/13/2017)   PRAPARE - Administrator, Civil Service (Medical): No    Lack of Transportation (Non-Medical): No  Physical Activity: Sufficiently Active (06/09/2019)   Exercise Vital Sign    Days of Exercise per Week: 5 days    Minutes of Exercise per Session: 150+ min  Stress: Stress Concern Present (08/13/2017)   [employer] of Occupational Health - Occupational Stress Questionnaire    Feeling of Stress : Rather much  Social Connections: Moderately Isolated (08/13/2017)   Social Connection and Isolation Panel [NHANES]    Frequency of Communication with Friends and Family: More than three times a week    Frequency of Social Gatherings with Friends and Family: Never    Attends Religious Services: Never    Database administrator or Organizations: No    Attends Banker Meetings: Never    Marital Status: Never married   Intimate Partner Violence: Not At Risk (06/09/2019)   Humiliation, Afraid, Rape, and Kick questionnaire    Fear of Current or Ex-Partner: No    Emotionally Abused: No    Physically Abused: No    Sexually Abused: No      Family History  Problem Relation Age of Onset   Diabetes Mother    Hypertension Mother    Hyperlipidemia Mother    Hypertension Father    Breast cancer Maternal Grandmother 50    There were no vitals filed for this visit.     Wt Readings from Last 3 Encounters:  03/28/23 (!) 307 lb (139.3 kg)  03/26/23 (!) 307 lb (139.3 kg)  02/20/23 (!) 307 lb 6.4 oz (139.4 kg)    PHYSICAL EXAM: Vitals:   04/04/23 1024  BP: (!) 103/58  Pulse: 66  SpO2: 98%   GENERAL: Well nourished, well developed, and in no apparent distress at rest.  HEENT: Negative for arcus senilis or xanthelasma. There is no scleral icterus.  The mucous membranes are pink and moist.   NECK: Supple, No masses. Normal carotid upstrokes without bruits. No masses or thyromegaly.    CHEST: There are no chest wall deformities. There is no chest wall tenderness. Respirations are unlabored.  Lungs- CTA B/L CARDIAC:  JVP: 7 cm          Normal rate with regular rhythm. No murmurs, rubs or gallops.  Pulses are 2+ and symmetrical in upper and lower extremities. No edema.  ABDOMEN: Soft, non-tender, non-distended. There are no masses or hepatomegaly. There are normal bowel sounds.  EXTREMITIES: Warm and well perfused with no cyanosis, clubbing.  LYMPHATIC: No axillary or supraclavicular lymphadenopathy.  NEUROLOGIC: Patient is oriented x3 with no focal or lateralizing neurologic deficits.  PSYCH: Patients affect is appropriate, there is no evidence of anxiety or depression.  SKIN: Warm and dry; no lesions or wounds.     ECG: Sinus with frequent PVCss Personally reviewed  Findings:   Ao = 117/77 (96) LV = 138/18 RA = 5 RV = 33/3 PA = 31/17 (26) PCW = 11 Fick cardiac output/index = 8.4/3.5 Thermo  CO/CI = 7.0/3.0 PVR =  1.9 WU FA sat = 96% PA sat = 71%, 73%   Assessment: 1. Normal coronary arteries 2. LVEF 60-65% 3. Mild PAH with high output    ASSESSMENT & PLAN:   1. Chest pain, Panic attacks - multiple CRFs - ideally would do chest CTA but I spoke with CT team and unable to get images due to PVCs - RHC/LHC on 03/24/23 w/ normal coronary arteries; CI of 3-3.5 L/m/m2.  - I suspect some of her issues are psychosomatic; she reports having frequent panic attacks (canceled her EGD yesterday). In addition, she is now scared of falling asleep. Will refer to behavioral health for further evaluation.   2. 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 BNP 47 -> 580 - NYHA II - Volume status seems improved with recent diuretic titrations - Continue Jardiance 10  - Continue torsemide 40 daily with metolazone once a week - Continue spiro 25 daily - Continue Entresto 97/103 bid - Continue carvedilol 25 bid  - BMP today; she has been hypokalemic previously. Taking KCL daily. Will increase spironolactone to 50mg  if potassium is low today and reduce Kcl.    3. HTN - Blood pressure well controlled. Continue current regimen.   4. PVC's, frequent - Zio 10/21 12.5% PVCs Saw EP Lalla Brothers) and suggested possible AAD (flecainide or propafenone) if developed symptoms or LV dysfunction.  - Zio patch 1/24. Sinus rhythm. No AF. 44 runs NSVT (longest 8 beats). 21.3% PVCs (3 morphologies 9.7%, 7.3%, 4.3%) - suspect driven by OSA but could aslo be infiltrative process (? Sarcoid) -> Check cMRI - Saw Dr. Lalla Brothers 3/24 felt not to be ablation candidate due to obesity. Recommended continue carvedilol as EF stable - PVCs not improve with CPAP - Now on mexilitene 200 bid -> increase to 300 bid - Awaiting cMRI (need insurance approval)   5. Possible AF  - noted on  sleep study and AppleWatch - No AF on Zio -> suspect this is likely PVCs  6. OSA, severe - Sleep study 07/24/22: AHI 35.4 Sats down to 75%. Has not started CPAP yet. - discussed role of weight loss - She has seen Dr. Tresa Endo for CPAP optimization. Download reviewed with her today. AHI now < 5  7. Morbid obesity - On ozempic  Total time spent 45 minutes. Over half that time spent discussing above.    Heather Wilner, DO  9:53 AM

## 2023-04-04 NOTE — Patient Instructions (Addendum)
  Lab Work:  Labs done today, your results will be available in MyChart, we will contact you for abnormal readings.  Referrals:  You have been referred to [facility]. You will receive a call from their clinic to schedule your appointment.   Special Instructions // Education:  Do the following things EVERYDAY: Weigh yourself in the morning before breakfast. Write it down and keep it in a log. Take your medicines as prescribed Eat low salt foods--Limit salt (sodium) to 2000 mg per day.  Stay as active as you can everyday Limit all fluids for the day to less than 2 liters   Follow-Up in: in 3 months with Dr. Gala Romney. Please call in November to schedule your December appointment.     If you have any questions or concerns before your next appointment please send [place] a message through [place] or call our office at [phone] Monday-Friday 8 am-5 pm.   If you have an urgent need after hours on the weekend please call your Primary Cardiologist or the [facility] in [place] at [phone].

## 2023-04-05 LAB — BRAIN NATRIURETIC PEPTIDE: BNP: 29.4 pg/mL (ref 0.0–100.0)

## 2023-04-05 LAB — BASIC METABOLIC PANEL
BUN/Creatinine Ratio: 10 (ref 9–23)
BUN: 19 mg/dL (ref 6–24)
CO2: 24 mmol/L (ref 20–29)
Calcium: 9.9 mg/dL (ref 8.7–10.2)
Chloride: 99 mmol/L (ref 96–106)
Creatinine, Ser: 1.81 mg/dL — ABNORMAL HIGH (ref 0.57–1.00)
Glucose: 120 mg/dL — ABNORMAL HIGH (ref 70–99)
Potassium: 4 mmol/L (ref 3.5–5.2)
Sodium: 140 mmol/L (ref 134–144)
eGFR: 34 mL/min/{1.73_m2} — ABNORMAL LOW (ref >59)

## 2023-04-12 ENCOUNTER — Other Ambulatory Visit: Payer: Self-pay | Admitting: Nephrology

## 2023-04-12 DIAGNOSIS — N1832 Chronic kidney disease, stage 3b: Secondary | ICD-10-CM | POA: Insufficient documentation

## 2023-04-12 DIAGNOSIS — R8290 Unspecified abnormal findings in urine: Secondary | ICD-10-CM

## 2023-04-12 DIAGNOSIS — E1122 Type 2 diabetes mellitus with diabetic chronic kidney disease: Secondary | ICD-10-CM | POA: Insufficient documentation

## 2023-04-12 DIAGNOSIS — I129 Hypertensive chronic kidney disease with stage 1 through stage 4 chronic kidney disease, or unspecified chronic kidney disease: Secondary | ICD-10-CM | POA: Insufficient documentation

## 2023-04-12 DIAGNOSIS — E785 Hyperlipidemia, unspecified: Secondary | ICD-10-CM | POA: Insufficient documentation

## 2023-04-19 ENCOUNTER — Ambulatory Visit
Admission: RE | Admit: 2023-04-19 | Discharge: 2023-04-19 | Disposition: A | Discharge status: Home / Self Care | Payer: Managed Care, Other (non HMO) | Source: Ambulatory Visit | Attending: Nephrology | Admitting: Nephrology

## 2023-04-19 DIAGNOSIS — E1122 Type 2 diabetes mellitus with diabetic chronic kidney disease: Secondary | ICD-10-CM | POA: Diagnosis present

## 2023-04-19 DIAGNOSIS — N1832 Chronic kidney disease, stage 3b: Secondary | ICD-10-CM | POA: Diagnosis present

## 2023-04-19 DIAGNOSIS — R8290 Unspecified abnormal findings in urine: Secondary | ICD-10-CM | POA: Insufficient documentation

## 2023-05-21 ENCOUNTER — Other Ambulatory Visit: Payer: Self-pay

## 2023-05-22 NOTE — Progress Notes (Unsigned)
Celso Amy, PA-C [address]  [address]  [place], [place] [address]  Main: [phone]  Fax: [phone]   Primary Care Physician: [facility]  Primary Gastroenterologist:  Celso Amy, PA-C / Dr. Lannette Donath    CC: F/U Chronic GERD, Belching  HPI: Heather Norman is a 47 y.o. female returns for 56-month follow-up of chronic GERD.   She is taking pantoprazole 40 Mg daily and famotidine 20 Mg twice daily for acid reflux.  Zofran as needed for nausea.  She is feeling better.  Acid reflux has improved, yet she still has a episodes of breakthrough heartburn.  Takes Mylanta and Tums daily for breakthrough heartburn symptoms.  She is trying to avoid GERD trigger foods.  She was scheduled for EGD 04/03/23 with Dr. Allegra Lai for intolerable acid reflux, however she canceled EGD due to panic attacks and fear of being sedated.  Medical history significant for CHF, diabetes, CKD, morbid obesity with BMI 51.4.  Currently on Ozempic.  Last cardiology follow-up 04/2023.  Echo 12/2021 showed normal LVEF 55 to 60%.  Underwent cardiac catheterization 03/28/2023 which was normal.  Negative chest x-ray and chest CT in the past 6 months.  Most recent labs 05/14/2023: Normal CBC, hemoglobin 13.3, BUN 23, creatinine 1.53, GFR 42.  Previous GI: She saw Dr. Marva Panda at [facility] clinic 09/2017 to evaluate loose stools and GERD.  She has family history of colon polyps in her mother.  Colon cancer in maternal grandmother.  Patient was scheduled for a colonoscopy in 2019, however she did not complete the procedure.  She reports having previous EGD many years ago, no results.  Patient states she completed a Cologuard test through her PCP recently which was negative.  She declines to schedule a colonoscopy.    Current Outpatient Medications  Medication Sig Dispense Refill   albuterol (VENTOLIN HFA) 108 (90 Base) MCG/ACT inhaler Inhale 1-2 puffs into the lungs every 6 (six) hours as  needed. 18 g 0   aluminum-magnesium hydroxide 200-200 MG/5ML suspension Take 15 mLs by mouth every 6 (six) hours as needed for indigestion.     aspirin EC 81 MG tablet Take 81 mg by mouth daily.     atorvastatin (LIPITOR) 20 MG tablet TAKE 1 TABLET(20 MG) BY MOUTH DAILY 90 tablet 3   carvedilol (COREG) 25 MG tablet TAKE 1 TABLET BY MOUTH IN THE MORNING AND 1 AND 1/2 TABLETS IN THE EVENING 225 tablet 3   Cholecalciferol (VITAMIN D3) 1.25 MG (50000 UT) CAPS Take 1 capsule by mouth once a week.     empagliflozin (JARDIANCE) 25 MG TABS tablet Take 1 tablet (25 mg total) by mouth daily before breakfast. 30 tablet 6   famotidine (PEPCID) 20 MG tablet Take 1 tablet (20 mg total) by mouth 2 (two) times daily.     famotidine-calcium carbonate-magnesium hydroxide (PEPCID COMPLETE) 10-800-165 MG chewable tablet Chew 1 tablet by mouth daily as needed.     latanoprost (XALATAN) 0.005 % ophthalmic solution Place 1 drop into both eyes at bedtime.     loratadine (CLARITIN) 10 MG tablet Take 10 mg by mouth daily. Takes daily     meclizine (ANTIVERT) 25 MG tablet Take 1 tablet (25 mg total) by mouth 3 (three) times daily as needed for dizziness or nausea. 30 tablet 1   metolazone (ZAROXOLYN) 2.5 MG tablet Take 1 tablet (2.5 mg total) by mouth 2 (two) times a week. 8 tablet 5   mexiletine (MEXITIL) 150 MG  capsule Take 2 capsules (300 mg total) by mouth 2 (two) times daily. 360 capsule 3   ondansetron (ZOFRAN-ODT) 4 MG disintegrating tablet Take 1 tablet (4 mg total) by mouth every 8 (eight) hours as needed for nausea or vomiting. 20 tablet 0   oxybutynin (DITROPAN) 5 MG tablet Take 5 mg by mouth 2 (two) times daily.     pantoprazole (PROTONIX) 40 MG tablet Take 1 tablet (40 mg total) by mouth daily. 90 tablet 3   potassium chloride 20 MEQ/15ML (10%) SOLN Take 45 mLs (60 mEq total) by mouth daily. And additional on the days you take the metolazone (Patient taking differently: Take 60 mEq by mouth daily. And  additional on the days you take the metolazone  Pt reports taking 60 MEQ daily and an additional 45 MEQ when taking Metolazone)     sacubitril-valsartan (ENTRESTO) 97-103 MG Take 1 tablet by mouth 2 (two) times daily. 90 tablet 3   Semaglutide,0.25 or 0.5MG /DOS, 2 MG/3ML SOPN Inject 0.25 mg into the skin once a week. 3 mL 5   spironolactone (ALDACTONE) 25 MG tablet TAKE 1 TABLET(25 MG) BY MOUTH DAILY 90 tablet 3   torsemide (DEMADEX) 20 MG tablet Take 4 tablets (80 mg total) by mouth daily. 180 tablet 6   No current facility-administered medications for this visit.    Allergies as of 05/23/2023 - Review Complete 05/23/2023  Allergen Reaction Noted   Penicillins Anaphylaxis 05/18/2011    Past Medical History:  Diagnosis Date   (HFpEF) heart failure with preserved ejection fraction (HCC)    a. 04/2017 Echo: EF 55-60%, no rwma, Mild MR, mildly dil LA. Nl RV fxn; b. 06/2019 Echo: EF 60-65%, mod LVH. Gr2 DD. Mildly dil LA.   Arthritis    kness/hands - no meds   CHF (congestive heart failure) (HCC)    Diabetes mellitus without complication (HCC)    DM (diabetes mellitus) (HCC) 05/11/2017   Dyspnea on exertion    Headache(784.0)    otc med prn   Heart murmur    Hyperlipidemia    no meds since pregnancy   Hypertension    Morbid obesity [facility])     Past Surgical History:  Procedure Laterality Date   CERVICAL CERCLAGE  06/01/2011   Procedure: CERCLAGE CERVICAL;  Surgeon: Philip Aspen, DO;  Location: [facility]S;  Service: Gynecology;  Laterality: N/A;  REQUESTING PORTABLE ULTARSOUND AT BEDSIDE PT'S EDC:12/02/2011   CERVICAL CERCLAGE  06/01/2011   Procedure: CERCLAGE CERVICAL;  Surgeon: Philip Aspen, DO;  Location: [facility] ORS;  Service: Gynecology;  Laterality: N/A;  REQUESTING PORTABLE ULTARSOUND AT BEDSIDE PT'S EDC:12/02/2011   endosocpy     RIGHT/LEFT HEART CATH AND CORONARY ANGIOGRAPHY N/A 03/28/2023   Procedure: RIGHT/LEFT HEART CATH AND CORONARY ANGIOGRAPHY;  Surgeon: Dolores Patty, MD;  Location: [facility] INVASIVE CV LAB;  Service: Cardiovascular;  Laterality: N/A;   svd     x 2   tab     x 1   TUBAL LIGATION      Review of Systems:    All systems reviewed and negative except where noted in HPI.   Physical Examination:   BP 101/64   Pulse 63   Temp 97.9 F (36.6 C)   Ht 5\' 5"  (1.651 m)   Wt (!) 303 lb 9.6 oz (137.7 kg)   BMI 50.52 kg/m   General: Well-nourished, well-developed in no acute distress.  Lungs: Clear to auscultation bilaterally. Non-labored. Heart: Regular rate and rhythm, no murmurs rubs  or gallops.  Abdomen: Bowel sounds are normal; Abdomen is Soft; No hepatosplenomegaly, masses or hernias;  No Abdominal Tenderness; No guarding or rebound tenderness. Neuro: Alert and oriented x 3.  Grossly intact.  Psych: Alert and cooperative, normal mood and affect.   Imaging Studies: No results found.  Assessment and Plan:   ALEGANDRA USUI is a 47 y.o. y/o female returns for follow-up of severe GERD.  Currently taking pantoprazole 40 Mg daily and famotidine 20 Mg twice daily with benefit, yet is not controlling her chest pain / acid reflux.  Recent cardiac evaluation unrevealing for sources of chest pain.  Normal cardiac catheterization.  She has had negative chest x-ray and chest CT in the past 6 months.  She was scheduled for EGD and canceled procedure due to fear of being sedated.  1.  Intractable chronic GERD - Improved yet not controlled  Upper GI series - evaluate for hiatal hernia  Continue strict GERD lifestyle modification  Continue pantoprazole 40 Mg once daily  Continue Pepcid 20 Mg twice daily  Continue Mylanta or Tums as needed.  Start sucralfate 1 g 3 times daily as needed  2.  Comorbidities: Morbid obesity (BMI 51), diabetes (on Ozempic), CHF, chronic kidney disease.  Celso Amy, PA-C  Follow up in 6 months.

## 2023-05-23 ENCOUNTER — Encounter: Payer: Self-pay | Admitting: Physician Assistant

## 2023-05-23 ENCOUNTER — Ambulatory Visit (INDEPENDENT_AMBULATORY_CARE_PROVIDER_SITE_OTHER): Payer: Managed Care, Other (non HMO) | Admitting: Physician Assistant

## 2023-05-23 VITALS — BP 101/64 | HR 63 | Temp 97.9°F | Ht 65.0 in | Wt 303.6 lb

## 2023-05-23 DIAGNOSIS — K219 Gastro-esophageal reflux disease without esophagitis: Secondary | ICD-10-CM

## 2023-05-23 NOTE — Patient Instructions (Signed)
Upper GI series scheduled @ [facility]  on 05/31/23 @ arrive at 8:30 am. . Nothing to eat/drink 3 hours prior.

## 2023-05-31 ENCOUNTER — Other Ambulatory Visit: Payer: Self-pay | Admitting: Physician Assistant

## 2023-05-31 ENCOUNTER — Ambulatory Visit
Admission: RE | Admit: 2023-05-31 | Discharge: 2023-05-31 | Disposition: A | Discharge status: Home / Self Care | Payer: Managed Care, Other (non HMO) | Source: Ambulatory Visit | Attending: Physician Assistant | Admitting: Physician Assistant

## 2023-05-31 DIAGNOSIS — K219 Gastro-esophageal reflux disease without esophagitis: Secondary | ICD-10-CM | POA: Insufficient documentation

## 2023-06-05 ENCOUNTER — Telehealth: Payer: Self-pay

## 2023-06-05 MED ORDER — PANTOPRAZOLE SODIUM 40 MG PO TBEC: 40.0000 mg | DELAYED_RELEASE_TABLET | Freq: Two times a day (BID) | ORAL | 5 refills | Status: DC

## 2023-06-05 NOTE — Telephone Encounter (Signed)
Spoke with patient and she wanted to know if you were going to up or change her dose of her medication- she stated you mentioned it at the last office visit.  Hi Heather Norman, your upper GI series test shows mild acid reflux and mild esophageal dysmotility (mild abnormal muscle contraction in the esophagus).  Otherwise normal.  No evidence of esophageal stricture or masses.  Stomach was normal.  Continue with current plan to treat acid reflux. Hi Heather Norman, your upper GI series x-ray shows NO evidence of hiatal hernia.  There is small amount .Marland KitchenMarland Kitchen

## 2023-06-05 NOTE — Telephone Encounter (Signed)
Patient notified.  Go ahead and increase pantoprazole to 40 mg 1 tablet twice daily if she is still having acid reflux.  #60, 5 refills. Celso Amy, PA-C

## 2023-07-17 ENCOUNTER — Ambulatory Visit
Admission: RE | Admit: 2023-07-17 | Discharge: 2023-07-17 | Disposition: A | Discharge status: Home / Self Care | Payer: Managed Care, Other (non HMO) | Source: Ambulatory Visit | Attending: Emergency Medicine | Admitting: Emergency Medicine

## 2023-07-17 VITALS — BP 134/74 | HR 64 | Temp 98.6°F | Resp 18

## 2023-07-17 DIAGNOSIS — J069 Acute upper respiratory infection, unspecified: Secondary | ICD-10-CM | POA: Diagnosis not present

## 2023-07-17 MED ORDER — AZITHROMYCIN 250 MG PO TABS: 250.0000 mg | ORAL_TABLET | Freq: Every day | ORAL | 0 refills | Status: DC

## 2023-07-17 MED ORDER — PROMETHAZINE-DM 6.25-15 MG/5ML PO SYRP: 5.0000 mL | ORAL_SOLUTION | Freq: Four times a day (QID) | ORAL | 0 refills | Status: DC | PRN

## 2023-07-17 MED ORDER — PREDNISONE 10 MG (21) PO TBPK: ORAL_TABLET | Freq: Every day | ORAL | 0 refills | Status: DC

## 2023-07-17 NOTE — Discharge Instructions (Signed)
Azithromycin to provide coverage for bacteria  Begin prednisone every morning with food as directed to open and relax airway, should help settle shortness of breath and wheezing, may continue use of inhaler additionally  May use cough syrup every 6 hours as needed, please be mindful this will make you feel drowsy    You can take Tylenol and/or Ibuprofen as needed for fever reduction and pain relief.   For cough: honey 1/2 to 1 teaspoon (you can dilute the honey in water or another fluid).  You can also use guaifenesin and dextromethorphan for cough. You can use a humidifier for chest congestion and cough.  If you don't have a humidifier, you can sit in the bathroom with the hot shower running.      For sore throat: try warm salt water gargles, cepacol lozenges, throat spray, warm tea or water with lemon/honey, popsicles or ice, or OTC cold relief medicine for throat discomfort.   For congestion: take a daily anti-histamine like Zyrtec, Claritin, and a oral decongestant, such as pseudoephedrine.  You can also use Flonase 1-2 sprays in each nostril daily.   It is important to stay hydrated: drink plenty of fluids (water, gatorade/powerade/pedialyte, juices, or teas) to keep your throat moisturized and help further relieve irritation/discomfort.

## 2023-07-17 NOTE — ED Triage Notes (Signed)
Patient to Urgent Care with complaints of productive cough/ drainage/ hoarseness/ head pressure/ bilateral ear pain. Denies any known fevers.  Reports symptoms started over 1 week ago.   Has been taking robitussin-DM.

## 2023-07-17 NOTE — ED Provider Notes (Signed)
Heather Norman    CSN: [id number] Arrival date & time: 07/17/23  0813      History   Chief Complaint Chief Complaint  Patient presents with   Cough    I have been coughing for a week now. Lose my voice twice. Having trouble breathing sometimes. - Entered by patient    HPI Heather Norman is a 47 y.o. female.   Patient presents for evaluation of nasal congestion, rhinorrhea, productive cough, bilateral ear pressure, intermittent generalized headaches , sore throat with 2 occurrences of hoarseness, shortness of breath with exertion and wheezing.  Persistent coughing has caused left-sided chest soreness and centralized back pain.  Known sick contact within household.  Attempted use of Robitussin DM and albuterol inhaler.  Tolerating food and fluids.  Past Medical History:  Diagnosis Date   (HFpEF) heart failure with preserved ejection fraction (HCC)    a. 04/2017 Echo: EF 55-60%, no rwma, Mild MR, mildly dil LA. Nl RV fxn; b. 06/2019 Echo: EF 60-65%, mod LVH. Gr2 DD. Mildly dil LA.   Arthritis    kness/hands - no meds   CHF (congestive heart failure) (HCC)    Diabetes mellitus without complication (HCC)    DM (diabetes mellitus) (HCC) 05/11/2017   Dyspnea on exertion    Headache(784.0)    otc med prn   Heart murmur    Hyperlipidemia    no meds since pregnancy   Hypertension    Morbid obesity (HCC)     Patient Active Problem List   Diagnosis Date Noted   Benign hypertensive kidney disease with chronic kidney disease 04/12/2023   Chronic kidney disease, stage 3b (HCC) 04/12/2023   Hyperlipidemia 04/12/2023   Type 2 diabetes mellitus with diabetic chronic kidney disease (HCC) 04/12/2023   OSA (obstructive sleep apnea) 10/25/2022   Numbness and tingling 06/20/2021   Difficulty sleeping 06/20/2021   Blurred vision 06/20/2021   Type 2 diabetes mellitus without complication, without long-term current use of insulin (HCC) 05/19/2018   Headache 09/12/2017    Psychological and behavioral factors associated with disorders or diseases classified elsewhere 05/28/2017   DM (diabetes mellitus) (HCC) 05/11/2017   Snoring 05/11/2017   Shortness of breath 04/25/2017   Morbid obesity (HCC) 08/30/2015   Chronic diastolic CHF (congestive heart failure) (HCC) 08/30/2015   Loose stools 06/07/2015   Adjustment disorder with depressed mood 03/11/2015   Encounter for screening for other disorder 03/11/2015   Essential hypertension 02/16/2015   Abnormal levels of other serum enzymes 12/17/2014   Encounter for surveillance of injectable contraceptive 07/28/2014   Other allergic rhinitis 07/28/2014   Hypokalemia 03/18/2014   Urinary tract infection 03/18/2014   Abnormal finding in urine 03/11/2014   Urinary incontinence 03/11/2014   Left ventricular hypertrophy 02/16/2014   Congestive heart failure (HCC) 11/11/2013   Elderly multigravida with antepartum condition or complication 05/22/2011    Past Surgical History:  Procedure Laterality Date   CERVICAL CERCLAGE  06/01/2011   Procedure: CERCLAGE CERVICAL;  Surgeon: Philip Aspen, DO;  Location: [facility];  Service: Gynecology;  Laterality: N/A;  REQUESTING PORTABLE ULTARSOUND AT BEDSIDE PT'S EDC:12/02/2011   CERVICAL CERCLAGE  06/01/2011   Procedure: CERCLAGE CERVICAL;  Surgeon: Philip Aspen, DO;  Location: [facility];  Service: Gynecology;  Laterality: N/A;  REQUESTING PORTABLE ULTARSOUND AT BEDSIDE PT'S EDC:12/02/2011   endosocpy     RIGHT/LEFT HEART CATH AND CORONARY ANGIOGRAPHY N/A 03/28/2023   Procedure: RIGHT/LEFT HEART CATH AND CORONARY ANGIOGRAPHY;  Surgeon: Dolores Patty, MD;  Location:  [facility];  Service: Cardiovascular;  Laterality: N/A;   svd     x 2   tab     x 1   TUBAL LIGATION      OB History     Gravida  5   Para  2   Term  1   Preterm  1   AB  2   Living  2      SAB  2   IAB  0   Ectopic  0   Multiple  0   Live Births               Home  Medications    Prior to Admission medications   Medication Sig Start Date End Date Taking? Authorizing Provider  azithromycin (ZITHROMAX) 250 MG tablet Take 1 tablet (250 mg total) by mouth daily. Take first 2 tablets together, then 1 every day until finished. 07/17/23  Yes Damion Kant R, NP  predniSONE (STERAPRED UNI-PAK 21 TAB) 10 MG (21) TBPK tablet Take by mouth daily. Take 6 tabs by mouth daily  for 1 days, then 5 tabs for 1 days, then 4 tabs for 1 days, then 3 tabs for 1 days, 2 tabs for 1 days, then 1 tab by mouth daily for 1 days 07/17/23  Yes Ramces Shomaker R, NP  promethazine-dextromethorphan (PROMETHAZINE-DM) 6.25-15 MG/5ML syrup Take 5 mLs by mouth 4 (four) times daily as needed for cough. 07/17/23  Yes Marquail Bradwell R, NP  albuterol (VENTOLIN HFA) 108 (90 Base) MCG/ACT inhaler Inhale 1-2 puffs into the lungs every 6 (six) hours as needed. 07/20/22   Mickie Bail, NP  aluminum-magnesium hydroxide 200-200 MG/5ML suspension Take 15 mLs by mouth every 6 (six) hours as needed for indigestion.    [provider]  aspirin EC 81 MG tablet Take 81 mg by mouth daily.    [provider]  atorvastatin (LIPITOR) 20 MG tablet TAKE 1 TABLET(20 MG) BY MOUTH DAILY 12/26/22   [name], FNP  carvedilol (COREG) 25 MG tablet TAKE 1 TABLET BY MOUTH IN THE MORNING AND 1 AND 1/2 TABLETS IN THE EVENING 02/19/23   [name], MD  Cholecalciferol (VITAMIN D3) 1.25 MG (50000 UT) CAPS Take 1 capsule by mouth once a week.    [provider]  empagliflozin (JARDIANCE) 25 MG TABS tablet Take 1 tablet (25 mg total) by mouth daily before breakfast. 01/25/23   Delma Freeze, FNP  famotidine (PEPCID) 20 MG tablet Take 1 tablet (20 mg total) by mouth 2 (two) times daily. 02/20/23   Celso Amy, PA-C  famotidine-calcium carbonate-magnesium hydroxide (PEPCID COMPLETE) 10-800-165 MG chewable tablet Chew 1 tablet by mouth daily as needed.    [provider]  latanoprost  (XALATAN) 0.005 % ophthalmic solution Place 1 drop into both eyes at bedtime. 01/17/22   [provider]  loratadine (CLARITIN) 10 MG tablet Take 10 mg by mouth daily. Takes daily    [provider]  meclizine (ANTIVERT) 25 MG tablet Take 1 tablet (25 mg total) by mouth 3 (three) times daily as needed for dizziness or nausea. 08/23/22   Sharman Cheek, MD  metolazone (ZAROXOLYN) 2.5 MG tablet Take 1 tablet (2.5 mg total) by mouth 2 (two) times a week. 01/25/23   Delma Freeze, FNP  mexiletine (MEXITIL) 150 MG capsule Take 2 capsules (300 mg total) by mouth 2 (two) times daily. 03/26/23   Bensimhon, Bevelyn Buckles, MD  ondansetron (ZOFRAN-ODT) 4 MG  disintegrating tablet Take 1 tablet (4 mg total) by mouth every 8 (eight) hours as needed for nausea or vomiting. 08/23/22   Sharman Cheek, MD  oxybutynin (DITROPAN) 5 MG tablet Take 5 mg by mouth 2 (two) times daily. 12/25/17   [provider]  pantoprazole (PROTONIX) 40 MG tablet Take 1 tablet (40 mg total) by mouth daily. 02/20/23 02/15/24  Celso Amy, PA-C  pantoprazole (PROTONIX) 40 MG tablet Take 1 tablet (40 mg total) by mouth 2 (two) times daily. 06/05/23   Celso Amy, PA-C  potassium chloride 20 MEQ/15ML (10%) SOLN Take 45 mLs (60 mEq total) by mouth daily. And additional on the days you take the metolazone Patient taking differently: Take 60 mEq by mouth daily. And additional on the days you take the metolazone  Pt reports taking 60 MEQ daily and an additional 45 MEQ when taking Metolazone 03/27/23   Milford, Anderson Malta, FNP  sacubitril-valsartan (ENTRESTO) 97-103 MG Take 1 tablet by mouth 2 (two) times daily. 12/19/22   Delma Freeze, FNP  Semaglutide,0.25 or 0.5MG /DOS, 2 MG/3ML SOPN Inject 0.25 mg into the skin once a week. 11/06/22   Delma Freeze, FNP  spironolactone (ALDACTONE) 25 MG tablet TAKE 1 TABLET(25 MG) BY MOUTH DAILY 04/13/22   [name], MD  torsemide (DEMADEX) 20 MG tablet Take 4 tablets  (80 mg total) by mouth daily. 03/05/23   Milford, Anderson Malta, FNP    Family History Family History  Problem Relation Age of Onset   Diabetes Mother    Hypertension Mother    Hyperlipidemia Mother    Hypertension Father    Breast cancer Maternal Grandmother 33    Social History Social History   Tobacco Use   Smoking status: Never   Smokeless tobacco: Never  Vaping Use   Vaping status: Never Used  Substance Use Topics   Alcohol use: No   Drug use: No     Allergies   Penicillins   Review of Systems Review of Systems  Respiratory:  Positive for cough.      Physical Exam Triage Vital Signs ED Triage Vitals  Encounter Vitals Group     BP 07/17/23 0831 134/74     Systolic BP Percentile --      Diastolic BP Percentile --      Pulse Rate 07/17/23 0831 64     Resp 07/17/23 0831 18     Temp 07/17/23 0831 98.6 F (37 C)     Temp Source 07/17/23 0831 Oral     SpO2 07/17/23 0831 94 %     Weight --      Height --      Head Circumference --      Peak Flow --      Pain Score 07/17/23 0821 3     Pain Loc --      Pain Education --      Exclude from Growth Chart --    No data found.  Updated Vital Signs BP 134/74 (BP Location: Left Arm)   Pulse 64   Temp 98.6 F (37 C) (Oral)   Resp 18   SpO2 94%   Visual Acuity Right Eye Distance:   Left Eye Distance:   Bilateral Distance:    Right Eye Near:   Left Eye Near:    Bilateral Near:     Physical Exam Constitutional:      Appearance: She is ill-appearing.  HENT:     Head: Normocephalic.  Right Ear: Tympanic membrane, ear canal and external ear normal.     Left Ear: Tympanic membrane, ear canal and external ear normal.     Nose: Congestion present. No rhinorrhea.     Mouth/Throat:     Pharynx: No oropharyngeal exudate or posterior oropharyngeal erythema.  Eyes:     Extraocular Movements: Extraocular movements intact.  Cardiovascular:     Rate and Rhythm: Normal rate and regular rhythm.     Pulses:  Normal pulses.     Heart sounds: Normal heart sounds.  Pulmonary:     Effort: Pulmonary effort is normal.     Breath sounds: Normal breath sounds.  Neurological:     Mental Status: She is alert and oriented to person, place, and time. Mental status is at baseline.      UC Treatments / Results  Labs (all labs ordered are listed, but only abnormal results are displayed) Labs Reviewed - No data to display  EKG   Radiology No results found.  Procedures Procedures (including critical care time)  Medications Ordered in UC Medications - No data to display  Initial Impression / Assessment and Plan / UC Course  I have reviewed the triage vital signs and the nursing notes.  Pertinent labs & imaging results that were available during my care of the patient were reviewed by me and considered in my medical decision making (see chart for details).  Acute URI  Patient is in no signs of distress nor toxic appearing.  Vital signs are stable.  Low suspicion for pneumonia, pneumothorax or bronchitis and therefore will defer imaging.  Testing deferred due to timeline of illness.  Symptoms present for 7 days, worsening therefore prescribed bacterial coverage, azithromycin, prednisone and Promethazine DM sent to pharmacy, declined prescription for Tessalon as she deems ineffective.May use additional over-the-counter medications as needed for supportive care.  May follow-up with urgent care as needed if symptoms persist or worsen.  Note given.   Final Clinical Impressions(s) / UC Diagnoses   Final diagnoses:  Acute URI     Discharge Instructions      Azithromycin to provide coverage for bacteria  Begin prednisone every morning with food as directed to open and relax airway, should help settle shortness of breath and wheezing, may continue use of inhaler additionally  May use cough syrup every 6 hours as needed, please be mindful this will make you feel drowsy    You can take Tylenol  and/or Ibuprofen as needed for fever reduction and pain relief.   For cough: honey 1/2 to 1 teaspoon (you can dilute the honey in water or another fluid).  You can also use guaifenesin and dextromethorphan for cough. You can use a humidifier for chest congestion and cough.  If you don't have a humidifier, you can sit in the bathroom with the hot shower running.      For sore throat: try warm salt water gargles, cepacol lozenges, throat spray, warm tea or water with lemon/honey, popsicles or ice, or OTC cold relief medicine for throat discomfort.   For congestion: take a daily anti-histamine like Zyrtec, Claritin, and a oral decongestant, such as pseudoephedrine.  You can also use Flonase 1-2 sprays in each nostril daily.   It is important to stay hydrated: drink plenty of fluids (water, gatorade/powerade/pedialyte, juices, or teas) to keep your throat moisturized and help further relieve irritation/discomfort.    ED Prescriptions     Medication Sig Dispense Auth. Provider   azithromycin (ZITHROMAX) 250 MG tablet  Take 1 tablet (250 mg total) by mouth daily. Take first 2 tablets together, then 1 every day until finished. 6 tablet Nirali Magouirk R, NP   predniSONE (STERAPRED UNI-PAK 21 TAB) 10 MG (21) TBPK tablet Take by mouth daily. Take 6 tabs by mouth daily  for 1 days, then 5 tabs for 1 days, then 4 tabs for 1 days, then 3 tabs for 1 days, 2 tabs for 1 days, then 1 tab by mouth daily for 1 days 21 tablet Naveyah Iacovelli R, NP   promethazine-dextromethorphan (PROMETHAZINE-DM) 6.25-15 MG/5ML syrup Take 5 mLs by mouth 4 (four) times daily as needed for cough. 118 mL Marsh Heckler, Elita Boone, NP      PDMP not reviewed this encounter.   Valinda Hoar, [place] 07/17/23 [phone]

## 2023-08-14 ENCOUNTER — Emergency Department: Payer: Managed Care, Other (non HMO)

## 2023-08-14 ENCOUNTER — Emergency Department
Admission: EM | Admit: 2023-08-14 | Discharge: 2023-08-14 | Disposition: A | Discharge status: Home / Self Care | Payer: Managed Care, Other (non HMO) | Attending: Student in an Organized Health Care Education/Training Program | Admitting: Student in an Organized Health Care Education/Training Program

## 2023-08-14 ENCOUNTER — Other Ambulatory Visit: Payer: Self-pay

## 2023-08-14 DIAGNOSIS — Z20822 Contact with and (suspected) exposure to covid-19: Secondary | ICD-10-CM | POA: Diagnosis not present

## 2023-08-14 DIAGNOSIS — E119 Type 2 diabetes mellitus without complications: Secondary | ICD-10-CM | POA: Diagnosis not present

## 2023-08-14 DIAGNOSIS — I5033 Acute on chronic diastolic (congestive) heart failure: Secondary | ICD-10-CM | POA: Diagnosis not present

## 2023-08-14 DIAGNOSIS — R0602 Shortness of breath: Secondary | ICD-10-CM | POA: Diagnosis present

## 2023-08-14 DIAGNOSIS — R519 Headache, unspecified: Secondary | ICD-10-CM | POA: Insufficient documentation

## 2023-08-14 DIAGNOSIS — I110 Hypertensive heart disease with heart failure: Secondary | ICD-10-CM | POA: Insufficient documentation

## 2023-08-14 DIAGNOSIS — I10 Essential (primary) hypertension: Secondary | ICD-10-CM

## 2023-08-14 LAB — CBC
HCT: 47.9 % — ABNORMAL HIGH (ref 36.0–46.0)
Hemoglobin: 16 g/dL — ABNORMAL HIGH (ref 12.0–15.0)
MCH: 30.1 pg (ref 26.0–34.0)
MCHC: 33.4 g/dL (ref 30.0–36.0)
MCV: 90 fL (ref 80.0–100.0)
Platelets: 313 10*3/uL (ref 150–400)
RBC: 5.32 MIL/uL — ABNORMAL HIGH (ref 3.87–5.11)
RDW: 14.3 % (ref 11.5–15.5)
WBC: 5.7 10*3/uL (ref 4.0–10.5)
nRBC: 0 % (ref 0.0–0.2)

## 2023-08-14 LAB — BASIC METABOLIC PANEL
Anion gap: 15 (ref 5–15)
BUN: 17 mg/dL (ref 6–20)
CO2: 24 mmol/L (ref 22–32)
Calcium: 9.2 mg/dL (ref 8.9–10.3)
Chloride: 100 mmol/L (ref 98–111)
Creatinine, Ser: 1.42 mg/dL — ABNORMAL HIGH (ref 0.44–1.00)
GFR, Estimated: 46 mL/min — ABNORMAL LOW (ref >60)
Glucose, Bld: 138 mg/dL — ABNORMAL HIGH (ref 70–99)
Potassium: 3.5 mmol/L (ref 3.5–5.1)
Sodium: 139 mmol/L (ref 135–145)

## 2023-08-14 LAB — RESP PANEL BY RT-PCR (RSV, FLU A&B, COVID)  RVPGX2
Influenza A by PCR: NEGATIVE
Influenza B by PCR: NEGATIVE
Resp Syncytial Virus by PCR: NEGATIVE
SARS Coronavirus 2 by RT PCR: NEGATIVE

## 2023-08-14 LAB — TROPONIN I (HIGH SENSITIVITY): Troponin I (High Sensitivity): 6 ng/L (ref <18)

## 2023-08-14 LAB — BRAIN NATRIURETIC PEPTIDE: B Natriuretic Peptide: 376.8 pg/mL — ABNORMAL HIGH (ref 0.0–100.0)

## 2023-08-14 MED ORDER — TORSEMIDE 20 MG PO TABS
60.0000 mg | ORAL_TABLET | Freq: Every day | ORAL | Status: DC
  Administered 2023-08-14: 60 mg via ORAL
  Filled 2023-08-14: qty 3

## 2023-08-14 MED ORDER — CARVEDILOL 6.25 MG PO TABS
37.5000 mg | ORAL_TABLET | Freq: Two times a day (BID) | ORAL | Status: DC
  Administered 2023-08-14: 37.5 mg via ORAL
  Filled 2023-08-14: qty 2

## 2023-08-14 MED ORDER — SACUBITRIL-VALSARTAN 97-103 MG PO TABS
1.0000 | ORAL_TABLET | Freq: Two times a day (BID) | ORAL | Status: DC
  Administered 2023-08-14: 1 via ORAL
  Filled 2023-08-14: qty 1

## 2023-08-14 MED ORDER — ACETAMINOPHEN 500 MG PO TABS
1000.0000 mg | ORAL_TABLET | Freq: Once | ORAL | Status: AC
  Administered 2023-08-14: 1000 mg via ORAL
  Filled 2023-08-14: qty 2

## 2023-08-14 NOTE — ED Provider Triage Note (Signed)
 Emergency Medicine Provider Triage Evaluation Note  Heather Norman , a 48 y.o. female  was evaluated in triage.  Pt complains of sob, hx of pvcs, has appt with her doctor on thurs.  Review of Systems  Positive:  Negative:   Physical Exam  BP (!) 159/117   Pulse 72   Temp 97.6 F (36.4 C)   Resp 17   Ht 5' 5 (1.651 m)   Wt (!) 137.7 kg   SpO2 98%   BMI 50.52 kg/m  Gen:   Awake, no distress   Resp:  Normal effort  MSK:   Moves extremities without difficulty  Other:    Medical Decision Making  Medically screening exam initiated at 12:57 PM.  Appropriate orders placed.  Friddie GORMAN Angle was informed that the remainder of the evaluation will be completed by another provider, this initial triage assessment does not replace that evaluation, and the importance of remaining in the ED until their evaluation is complete.     Gasper Devere ORN, PA-C 08/14/23 1257

## 2023-08-14 NOTE — ED Triage Notes (Signed)
 Pt here with SOB that started this morning. Pt denies cp but states she is having a hard time catching her breath. Pt endorses diarrhea as well. Pt also has a cough but denies coughing up anything. Pt stable in triage.

## 2023-08-14 NOTE — ED Notes (Signed)
 Pt. To CT

## 2023-08-14 NOTE — ED Provider Notes (Signed)
 [facility] Provider Note    Event Date/Time   First MD Initiated Contact with Patient 08/14/23 1639     (approximate)   History   Shortness of Breath   HPI  Heather Norman is a 48 y.o. female with a history of diabetes as well as chronic diastolic congestive heart failure presents to the ER for evaluation of worsening dyspnea today.  States that she felt like she was retaining little bit fluid and took an extra torsemide .  Went to work and started feeling worsening shortness of breath throughout the day.  States that her blood pressure was running high when she was checked at work.  Her oxygen level was 99%.  Does not wear home oxygen.  She denies any chest pain or pressure at this time.  States that she has had good diuresis after taking the extra fluid pill.     Physical Exam   Triage Vital Signs: ED Triage Vitals  Encounter Vitals Group     BP 08/14/23 1246 (!) 159/117     Systolic BP Percentile --      Diastolic BP Percentile --      Pulse Rate 08/14/23 1245 72     Resp 08/14/23 1245 17     Temp 08/14/23 1246 97.6 F (36.4 C)     Temp Source 08/14/23 1245 Oral     SpO2 08/14/23 1245 98 %     Weight 08/14/23 1246 (!) 303 lb 9.2 oz (137.7 kg)     Height 08/14/23 1246 5' 5 (1.651 m)     Head Circumference --      Peak Flow --      Pain Score 08/14/23 1246 0     Pain Loc --      Pain Education --      Exclude from Growth Chart --     Most recent vital signs: Vitals:   08/14/23 1740 08/14/23 1916  BP: (!) 159/117 122/70  Pulse: 72 62  Resp:  14  Temp:  97.6 F (36.4 C)  SpO2:  94%     Constitutional: Alert  Eyes: Conjunctivae are normal.  Head: Atraumatic. Nose: No congestion/rhinnorhea. Mouth/Throat: Mucous membranes are moist.   Neck: Painless ROM.  Cardiovascular:   Good peripheral circulation. Respiratory: Normal respiratory effort.  No retractions.  Gastrointestinal: Soft and nontender.  Musculoskeletal:  no  deformity Neurologic:  MAE spontaneously. No gross focal neurologic deficits are appreciated.  Skin:  Skin is warm, dry and intact. No rash noted. Psychiatric: Mood and affect are normal. Speech and behavior are normal.    ED Results / Procedures / Treatments   Labs (all labs ordered are listed, but only abnormal results are displayed) Labs Reviewed  CBC - Abnormal; Notable for the following components:      Result Value   RBC 5.32 (*)    Hemoglobin 16.0 (*)    HCT 47.9 (*)    All other components within normal limits  BASIC METABOLIC PANEL - Abnormal; Notable for the following components:   Glucose, Bld 138 (*)    Creatinine, Ser 1.42 (*)    GFR, Estimated 46 (*)    All other components within normal limits  BRAIN NATRIURETIC PEPTIDE - Abnormal; Notable for the following components:   B Natriuretic Peptide 376.8 (*)    All other components within normal limits  RESP PANEL BY RT-PCR (RSV, FLU A&B, COVID)  [id number]  TROPONIN I (HIGH SENSITIVITY)     EKG  ED ECG REPORT I, Belvie Essex, the attending physician, personally viewed and interpreted this ECG.   Date: 08/14/2023  EKG Time: 12:51  Rate: 80  Rhythm: sinus  Axis: normal  Intervals: normal qt  ST&T Change: no stemi, depressions    RADIOLOGY Please see ED Course for my review and interpretation.  I personally reviewed all radiographic images ordered to evaluate for the above acute complaints and reviewed radiology reports and findings.  These findings were personally discussed with the patient.  Please see medical record for radiology report.    PROCEDURES:  Critical Care performed: No  Procedures   MEDICATIONS ORDERED IN ED: Medications  carvedilol  (COREG ) tablet 37.5 mg (37.5 mg Oral Given 08/14/23 1740)  sacubitril -valsartan  (ENTRESTO ) 97-103 mg per tablet (1 tablet Oral Given 08/14/23 1742)  torsemide  (DEMADEX ) tablet 60 mg (60 mg Oral Given 08/14/23 1821)  acetaminophen  (TYLENOL ) tablet 1,000 mg  (1,000 mg Oral Given 08/14/23 1821)     IMPRESSION / MDM / ASSESSMENT AND PLAN / ED COURSE  I reviewed the triage vital signs and the nursing notes.                              Differential diagnosis includes, but is not limited to, Asthma, copd, CHF, pna, ptx, malignancy, Pe, anemia  Patient presenting to the ER for evaluation of symptoms as described above.  Based on symptoms, risk factors and considered above differential, this presenting complaint could reflect a potentially life-threatening illness therefore the patient will be placed on continuous pulse oximetry and telemetry for monitoring.  Laboratory evaluation will be sent to evaluate for the above complaints.  Patient is well-appearing nontoxic no respiratory distress she is hypertensive however does have some vascular congestion.  She is already taken extra dose of diuresis and is adequately responding but still with diastolics are elevated therefore will order antihypertensive medications including further evaluation with troponin and BNP.  Chest x-ray on my review and interpretation without evidence of significant effusion.    Clinical Course as of 08/14/23 1942  Tue Aug 14, 2023  1815 Given patient's swelling of the BNP and vascular congestion will give additional dose of he p.o. diuretic.  She is complaining of headache and given her hypertension will order CT head.  Viral panel is negative.  Troponin negative. [PR]  1902 CT head without evidence of IPH or with findings suggest etiology of acute headache.  Does have history of migraines may be secondary to that states that she also has not had anything to eat today. [PR]  1930 Repeat BP significantly improved.  She is not hypoxic has had reassuring workup I do believe she stable and appropriate for outpatient follow-up at this point. [PR]    Clinical Course User Index [PR] Essex Belvie, MD     FINAL CLINICAL IMPRESSION(S) / ED DIAGNOSES   Final diagnoses:   Hypertension, unspecified type  Acute on chronic diastolic congestive heart failure (HCC)     Rx / DC Orders   ED Discharge Orders     None        Note:  This document was prepared using Dragon voice recognition software and may include unintentional dictation errors.    Essex Belvie, MD 08/14/23 [phone]

## 2023-08-16 ENCOUNTER — Ambulatory Visit: Payer: Managed Care, Other (non HMO) | Attending: Internal Medicine | Admitting: Internal Medicine

## 2023-08-16 ENCOUNTER — Encounter: Payer: Managed Care, Other (non HMO) | Admitting: Internal Medicine

## 2023-08-16 VITALS — BP 87/63 | HR 65 | Wt 293.1 lb

## 2023-08-16 DIAGNOSIS — G4733 Obstructive sleep apnea (adult) (pediatric): Secondary | ICD-10-CM | POA: Diagnosis not present

## 2023-08-16 DIAGNOSIS — E119 Type 2 diabetes mellitus without complications: Secondary | ICD-10-CM | POA: Insufficient documentation

## 2023-08-16 DIAGNOSIS — Z79899 Other long term (current) drug therapy: Secondary | ICD-10-CM | POA: Insufficient documentation

## 2023-08-16 DIAGNOSIS — I5032 Chronic diastolic (congestive) heart failure: Secondary | ICD-10-CM

## 2023-08-16 DIAGNOSIS — Z7985 Long-term (current) use of injectable non-insulin antidiabetic drugs: Secondary | ICD-10-CM | POA: Insufficient documentation

## 2023-08-16 DIAGNOSIS — I110 Hypertensive heart disease with heart failure: Secondary | ICD-10-CM | POA: Diagnosis not present

## 2023-08-16 DIAGNOSIS — I10 Essential (primary) hypertension: Secondary | ICD-10-CM | POA: Diagnosis not present

## 2023-08-16 DIAGNOSIS — Z7984 Long term (current) use of oral hypoglycemic drugs: Secondary | ICD-10-CM | POA: Insufficient documentation

## 2023-08-16 DIAGNOSIS — E785 Hyperlipidemia, unspecified: Secondary | ICD-10-CM | POA: Insufficient documentation

## 2023-08-16 DIAGNOSIS — I5033 Acute on chronic diastolic (congestive) heart failure: Secondary | ICD-10-CM

## 2023-08-16 DIAGNOSIS — I493 Ventricular premature depolarization: Secondary | ICD-10-CM | POA: Diagnosis not present

## 2023-08-16 DIAGNOSIS — M1990 Unspecified osteoarthritis, unspecified site: Secondary | ICD-10-CM | POA: Insufficient documentation

## 2023-08-16 MED ORDER — METOLAZONE 2.5 MG PO TABS: 2.5000 mg | ORAL_TABLET | ORAL | 3 refills | Status: DC

## 2023-08-16 NOTE — Progress Notes (Signed)
ADVANCED HF CLINIC NOTE  Referring Provider: Clarisa Kindred, NP Primary Care: [facility] Primary Cardiologist: Julien Nordmann, MD   HPI:  Heather Norman is a 48 y/o female with a history of arthritis, DM, hyperlipidemia, HTN, morbid obesity and chronic diastolic heart failure.   Echo 01/26/22 EF 55-60% mild LVH and trivial MR. Echo report 02/10/21 EF 50-55% G1DD   Zio 10/21 12.5% PVCs Saw EP Lalla Brothers) and suggested possible AAD (flecainide or propafenone) if developed symptoms or LV dysfunction.   Sleep study 07/24/22: AHI 35.4 Sats down to 75%.  Possible AF during study lasting 4.5 hours. Zio placed    Zio patch 1/24  - Sinus rhythm. No AF - 44 runs NSVT (longest 8 beats)  - 21.3% PVCs (3 morphologies 9.7%, 7.3%, 4.3%)  Saw Dr. Lalla Brothers in 3/24. felt not to be ablation candidate due to obesity. Recommended continue carvedilol as EF stable  Started on CPAP in 2024. Wore CPA for awhile but then got bronchitis and stopped.   Seen in ER 10/30/22 for CP. Hstrop, ECG and d-Dimer all normal. BNP elevated 47 -> 480 so torsemide increased.  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   Came to ED Tuesday for acute onset SOB. CXR + edema.  and CT scan ok.  BP 150/117 BNP 139 -> 377. Given IV lasix  Drinks a lot of water as she says her mouth stays dry. SBP typically 120-130/70-80s  No edema, orthopnea or PND. Has not been wearing CPAP since she had bronchitis at the end of last year. Taking torsemide 80 daily and metolazone 1x/week (was 2x/week but decreased by Dr. Mariah Milling)   Past Medical History:  Diagnosis Date   (HFpEF) heart failure with preserved ejection fraction (HCC)    a. 04/2017 Echo: EF 55-60%, no rwma, Mild MR, mildly dil LA. Nl RV fxn; b. 06/2019 Echo: EF 60-65%, mod LVH. Gr2 DD. Mildly dil LA.   Arthritis    kness/hands - no meds   CHF (congestive heart failure) (HCC)    Diabetes mellitus  without complication (HCC)    DM (diabetes mellitus) (HCC) 05/11/2017   Dyspnea on exertion    Headache(784.0)    otc med prn   Heart murmur    Hyperlipidemia    no meds since pregnancy   Hypertension    Morbid obesity (HCC)     Current Outpatient Medications  Medication Sig Dispense Refill   albuterol (VENTOLIN HFA) 108 (90 Base) MCG/ACT inhaler Inhale 1-2 puffs into the lungs every 6 (six) hours as needed. 18 g 0   aluminum-magnesium hydroxide 200-200 MG/5ML suspension Take 15 mLs by mouth every 6 (six) hours as needed for indigestion.     aspirin EC 81 MG tablet Take 81 mg by mouth daily.     atorvastatin (LIPITOR) 20 MG tablet TAKE 1 TABLET(20 MG) BY MOUTH DAILY 90 tablet 3   carvedilol (COREG) 25 MG tablet TAKE 1 TABLET BY MOUTH IN THE MORNING AND 1 AND 1/2 TABLETS IN THE EVENING 225 tablet 3   empagliflozin (JARDIANCE) 25 MG TABS tablet Take 1 tablet (25 mg total) by mouth daily before breakfast. 30 tablet 6   famotidine (PEPCID) 20 MG tablet Take 1 tablet (20 mg total) by mouth 2 (two) times daily.     famotidine-calcium carbonate-magnesium hydroxide (PEPCID COMPLETE) 10-800-165 MG chewable tablet Chew 1 tablet by mouth daily as needed.  latanoprost (XALATAN) 0.005 % ophthalmic solution Place 1 drop into both eyes at bedtime.     loratadine (CLARITIN) 10 MG tablet Take 10 mg by mouth daily. Takes daily     meclizine (ANTIVERT) 25 MG tablet Take 1 tablet (25 mg total) by mouth 3 (three) times daily as needed for dizziness or nausea. 30 tablet 1   metolazone (ZAROXOLYN) 2.5 MG tablet Take 2.5 mg by mouth once a week.     mexiletine (MEXITIL) 150 MG capsule Take 2 capsules (300 mg total) by mouth 2 (two) times daily. 360 capsule 3   oxybutynin (DITROPAN) 5 MG tablet Take 5 mg by mouth 2 (two) times daily.     pantoprazole (PROTONIX) 40 MG tablet Take 1 tablet (40 mg total) by mouth daily. 90 tablet 3   potassium chloride 20 MEQ/15ML (10%) SOLN Take 45 mLs (60 mEq total) by mouth  daily. And additional on the days you take the metolazone     sacubitril-valsartan (ENTRESTO) 97-103 MG Take 1 tablet by mouth 2 (two) times daily. 90 tablet 3   Semaglutide,0.25 or 0.5MG /DOS, 2 MG/3ML SOPN Inject 0.25 mg into the skin once a week. 3 mL 5   spironolactone (ALDACTONE) 25 MG tablet TAKE 1 TABLET(25 MG) BY MOUTH DAILY 90 tablet 3   torsemide (DEMADEX) 20 MG tablet Take 4 tablets (80 mg total) by mouth daily. 180 tablet 6   No current facility-administered medications for this visit.    Allergies  Allergen Reactions   Penicillins Anaphylaxis    Has patient had a PCN reaction causing immediate rash, facial/tongue/throat swelling, SOB or lightheadedness with hypotension: Yes Has patient had a PCN reaction causing severe rash involving mucus membranes or skin necrosis: No Has patient had a PCN reaction that required hospitalization: No Has patient had a PCN reaction occurring within the last 10 years: No If all of the above answers are "NO", then may proceed with Cephalosporin use.      Social History   Socioeconomic History   Marital status: Single    Spouse name: Not on file   Number of children: 3   Years of education: 12   Highest education level: 12th grade  Occupational History   Not on file  Tobacco Use   Smoking status: Never   Smokeless tobacco: Never  Vaping Use   Vaping status: Never Used  Substance and Sexual Activity   Alcohol use: No   Drug use: No   Sexual activity: Yes    Birth control/protection: Surgical  Other Topics Concern   Not on file  Social History Narrative   Not on file   Social Drivers of Health   Financial Resource Strain: Medium Risk (08/13/2017)   Overall Financial Resource Strain (CARDIA)    Difficulty of Paying Living Expenses: Somewhat hard  Food Insecurity: Food Insecurity Present (08/13/2017)   Hunger Vital Sign    Worried About Running Out of Food in the Last Year: Often true    Ran Out of Food in the Last Year:  Sometimes true  Transportation Needs: No Transportation Needs (08/13/2017)   PRAPARE - Administrator, Civil Service (Medical): No    Lack of Transportation (Non-Medical): No  Physical Activity: Sufficiently Active (06/09/2019)   Exercise Vital Sign    Days of Exercise per Week: 5 days    Minutes of Exercise per Session: 150+ min  Stress: Stress Concern Present (08/13/2017)   [employer] of Occupational Health - Occupational Stress Questionnaire  Feeling of Stress : Rather much  Social Connections: Moderately Isolated (08/13/2017)   Social Connection and Isolation Panel [NHANES]    Frequency of Communication with Friends and Family: More than three times a week    Frequency of Social Gatherings with Friends and Family: Never    Attends Religious Services: Never    Database administrator or Organizations: No    Attends Banker Meetings: Never    Marital Status: Never married  Intimate Partner Violence: Not At Risk (06/09/2019)   Humiliation, Afraid, Rape, and Kick questionnaire    Fear of Current or Ex-Partner: No    Emotionally Abused: No    Physically Abused: No    Sexually Abused: No      Family History  Problem Relation Age of Onset   Diabetes Mother    Hypertension Mother    Hyperlipidemia Mother    Hypertension Father    Breast cancer Maternal Grandmother 89    Vitals:   08/16/23 1109  BP: (!) 89/64  Pulse: 65  SpO2: 98%  Weight: 293 lb 2 oz (133 kg)       Wt Readings from Last 3 Encounters:  08/16/23 293 lb 2 oz (133 kg)  08/14/23 (!) 303 lb 9.2 oz (137.7 kg)  05/23/23 (!) 303 lb 9.6 oz (137.7 kg)    PHYSICAL EXAM: Vitals:   08/16/23 1109  BP: (!) 89/64  Pulse: 65  SpO2: 98%   General:  Well appearing. No resp difficulty HEENT: normal Neck: supple. no JVD. Carotids 2+ bilat; no bruits. No lymphadenopathy or thryomegaly appreciated. Cor: PMI nondisplaced. Regular rate & rhythm. No rubs, gallops or murmurs. Lungs:  clear Abdomen: obese  soft, nontender, nondistended. No hepatosplenomegaly. No bruits or masses. Good bowel sounds. Extremities: no cyanosis, clubbing, rash, edema Neuro: alert & orientedx3, cranial nerves grossly intact. moves all 4 extremities w/o difficulty. Affect pleasant   ASSESSMENT & PLAN:   1. 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 BNP 47 -> 580 - NYHA III - Recent ED visit for volume overload. CXR + edema. BP elevated - Volume status remains elevated ReDS 49%  - Continue torsemide 80 daily. Increase metolazone back to 5mg  2x/week + KCL 60  - Discussed fluid restriction - Continue Jardiance 10  - Continue spiro 25 daily - Continue Entresto 97/103 bid - Continue carvedilol 25 bid  - She remains volume overloaded. We discussed option. Will increase metoalzone back to 2x/week and follow response. Ask her to limit fluid intake.  - We also discussed possible Cardiomems but size may prohibit.  - Check labs  - Repeat echo    2. HTN - Blood pressure well controlled today. Management as above   3. PVC's, frequent - Zio 10/21 12.5% PVCs Saw EP Lalla Brothers) and suggested possible AAD (flecainide or propafenone) if developed symptoms or LV dysfunction.  - Zio patch 1/24. Sinus No AF. 44 runs NSVT (longest 8 beats). 21.3% PVCs (3 morphologies 9.7%, 7.3%, 4.3%) - suspect driven by OSA but could aslo be infiltrative process (? Sarcoid) -> Check cMRI - Saw Dr. Lalla Brothers 3/24 felt not to be ablation candidate due to obesity. Recommended continue carvedilol as EF stable - PVCs not improve with CPAP - Now on mexilitene 300 bid - Still awaiting cMRI (need insurance approval)   4. Possible AF  - noted on  sleep study and AppleWatch - No AF on Zio -> suspect this is likely PVCs  5. OSA, severe - Sleep study 07/24/22: AHI 35.4 Sats down to 75%.  Has not started CPAP yet. - discussed role of weight loss - Stopped wearing CPAP. Stressed need to resume. Refer to Pulmonary to establish CPAP car  6. Morbid obesity - On ozempic - continue  - Continue activity.  Arvilla Meres, MD  11:11 AM

## 2023-08-16 NOTE — Patient Instructions (Addendum)
RedsClip done today.   Labs done today. We will contact you only if your labs are abnormal.  INCREASE Metolazone to 2.5mg  (1 tablet) by mouth 2 times a week.  No other medication changes were made. Please continue all current medications as prescribed.  Your physician recommends that you schedule a follow-up appointment soon for an echo, in 2 weeks for labs and in 4 weeks with Dr. Gala Romney.   Your physician has requested that you have an echocardiogram. Echocardiography is a painless test that uses sound waves to create images of your heart. It provides your doctor with information about the size and shape of your heart and how well your heart's chambers and valves are working. This procedure takes approximately one hour. There are no restrictions for this procedure. Please do NOT wear cologne, perfume, aftershave, or lotions (deodorant is allowed). Please arrive 15 minutes prior to your appointment time.  Please note: We ask at that you not bring children with you during ultrasound (echo/ vascular) testing. Due to room size and safety concerns, children are not allowed in the ultrasound rooms during exams. Our front office staff cannot provide observation of children in our lobby area while testing is being conducted. An adult accompanying a patient to their appointment will only be allowed in the ultrasound room at the discretion of the ultrasound technician under special circumstances. We apologize for any inconvenience.    If you have any questions or concerns before your next appointment please send [place] a message through [place] or call our office at [phone].    TO LEAVE A MESSAGE FOR THE NURSE SELECT OPTION 2, PLEASE LEAVE A MESSAGE INCLUDING: YOUR NAME DATE OF BIRTH CALL BACK NUMBER REASON FOR CALL**this is important as we prioritize the call backs  YOU WILL RECEIVE A CALL BACK THE SAME DAY AS LONG AS YOU CALL BEFORE 4:00 PM   Do the following things EVERYDAY: Weigh yourself  in the morning before breakfast. Write it down and keep it in a log. Take your medicines as prescribed Eat low salt foods--Limit salt (sodium) to 2000 mg per day.  Stay as active as you can everyday Limit all fluids for the day to less than 2 liters   At the [facility], you and your health needs are our priority. As part of our continuing mission to provide you with exceptional heart care, we have created designated Provider Care Teams. These Care Teams include your primary Cardiologist (physician) and Advanced Practice Providers (APPs- Physician Assistants and Nurse Practitioners) who all work together to provide you with the care you need, when you need it.   You may see any of the following providers on your designated Care Team at your next follow up: Dr Arvilla Meres Dr Marca Ancona Dr. Marcos Eke, NP Robbie Lis, [place] [facility] [place], [place] Brynda Peon, NP Karle Plumber, PharmD   Please be sure to bring in all your medications bottles to every appointment.    Thank you for choosing [facility]

## 2023-08-16 NOTE — Progress Notes (Signed)
ReDS Vest / Clip - 08/16/23 1200       ReDS Vest / Clip   Station Marker B    Ruler Value 35    ReDS Value Range High volume overload    ReDS Actual Value 49    Anatomical Comments sitting

## 2023-08-22 ENCOUNTER — Other Ambulatory Visit: Payer: Self-pay

## 2023-08-22 ENCOUNTER — Inpatient Hospital Stay
Admission: EM | Admit: 2023-08-22 | Discharge: 2023-08-24 | DRG: 683 | Disposition: A | Discharge status: Home / Self Care | Payer: Managed Care, Other (non HMO) | Attending: Internal Medicine | Admitting: Internal Medicine

## 2023-08-22 ENCOUNTER — Telehealth: Payer: Self-pay

## 2023-08-22 ENCOUNTER — Emergency Department: Payer: Managed Care, Other (non HMO)

## 2023-08-22 DIAGNOSIS — Z1152 Encounter for screening for COVID-19: Secondary | ICD-10-CM

## 2023-08-22 DIAGNOSIS — Z803 Family history of malignant neoplasm of breast: Secondary | ICD-10-CM

## 2023-08-22 DIAGNOSIS — Z7982 Long term (current) use of aspirin: Secondary | ICD-10-CM

## 2023-08-22 DIAGNOSIS — I959 Hypotension, unspecified: Secondary | ICD-10-CM | POA: Diagnosis present

## 2023-08-22 DIAGNOSIS — E860 Dehydration: Secondary | ICD-10-CM | POA: Diagnosis present

## 2023-08-22 DIAGNOSIS — N179 Acute kidney failure, unspecified: Principal | ICD-10-CM

## 2023-08-22 DIAGNOSIS — E1122 Type 2 diabetes mellitus with diabetic chronic kidney disease: Secondary | ICD-10-CM | POA: Diagnosis present

## 2023-08-22 DIAGNOSIS — Z79899 Other long term (current) drug therapy: Secondary | ICD-10-CM

## 2023-08-22 DIAGNOSIS — I493 Ventricular premature depolarization: Secondary | ICD-10-CM | POA: Diagnosis present

## 2023-08-22 DIAGNOSIS — E785 Hyperlipidemia, unspecified: Secondary | ICD-10-CM | POA: Diagnosis present

## 2023-08-22 DIAGNOSIS — M159 Polyosteoarthritis, unspecified: Secondary | ICD-10-CM | POA: Diagnosis present

## 2023-08-22 DIAGNOSIS — E66813 Obesity, class 3: Secondary | ICD-10-CM | POA: Diagnosis present

## 2023-08-22 DIAGNOSIS — Z7984 Long term (current) use of oral hypoglycemic drugs: Secondary | ICD-10-CM

## 2023-08-22 DIAGNOSIS — Z7985 Long-term (current) use of injectable non-insulin antidiabetic drugs: Secondary | ICD-10-CM

## 2023-08-22 DIAGNOSIS — R531 Weakness: Secondary | ICD-10-CM | POA: Diagnosis present

## 2023-08-22 DIAGNOSIS — Z880 Allergy status to penicillin: Secondary | ICD-10-CM

## 2023-08-22 DIAGNOSIS — I5032 Chronic diastolic (congestive) heart failure: Secondary | ICD-10-CM | POA: Diagnosis present

## 2023-08-22 DIAGNOSIS — E1165 Type 2 diabetes mellitus with hyperglycemia: Secondary | ICD-10-CM | POA: Diagnosis present

## 2023-08-22 DIAGNOSIS — T502X5A Adverse effect of carbonic-anhydrase inhibitors, benzothiadiazides and other diuretics, initial encounter: Secondary | ICD-10-CM | POA: Diagnosis present

## 2023-08-22 DIAGNOSIS — K529 Noninfective gastroenteritis and colitis, unspecified: Secondary | ICD-10-CM | POA: Diagnosis present

## 2023-08-22 DIAGNOSIS — Z833 Family history of diabetes mellitus: Secondary | ICD-10-CM

## 2023-08-22 DIAGNOSIS — I130 Hypertensive heart and chronic kidney disease with heart failure and stage 1 through stage 4 chronic kidney disease, or unspecified chronic kidney disease: Secondary | ICD-10-CM | POA: Diagnosis present

## 2023-08-22 DIAGNOSIS — N182 Chronic kidney disease, stage 2 (mild): Secondary | ICD-10-CM | POA: Diagnosis present

## 2023-08-22 DIAGNOSIS — E876 Hypokalemia: Secondary | ICD-10-CM | POA: Diagnosis present

## 2023-08-22 DIAGNOSIS — Z6841 Body Mass Index (BMI) 40.0 and over, adult: Secondary | ICD-10-CM

## 2023-08-22 DIAGNOSIS — Z8249 Family history of ischemic heart disease and other diseases of the circulatory system: Secondary | ICD-10-CM

## 2023-08-22 DIAGNOSIS — Z83438 Family history of other disorder of lipoprotein metabolism and other lipidemia: Secondary | ICD-10-CM

## 2023-08-22 LAB — CBC WITH DIFFERENTIAL/PLATELET
Abs Immature Granulocytes: 0.02 10*3/uL (ref 0.00–0.07)
Basophils Absolute: 0.1 10*3/uL (ref 0.0–0.1)
Basophils Relative: 1 %
Eosinophils Absolute: 0.1 10*3/uL (ref 0.0–0.5)
Eosinophils Relative: 1 %
HCT: 46.7 % — ABNORMAL HIGH (ref 36.0–46.0)
Hemoglobin: 15.8 g/dL — ABNORMAL HIGH (ref 12.0–15.0)
Immature Granulocytes: 0 %
Lymphocytes Relative: 39 %
Lymphs Abs: 2.8 10*3/uL (ref 0.7–4.0)
MCH: 30.1 pg (ref 26.0–34.0)
MCHC: 33.8 g/dL (ref 30.0–36.0)
MCV: 89 fL (ref 80.0–100.0)
Monocytes Absolute: 0.6 10*3/uL (ref 0.1–1.0)
Monocytes Relative: 8 %
Neutro Abs: 3.5 10*3/uL (ref 1.7–7.7)
Neutrophils Relative %: 51 %
Platelets: 341 10*3/uL (ref 150–400)
RBC: 5.25 MIL/uL — ABNORMAL HIGH (ref 3.87–5.11)
RDW: 13.6 % (ref 11.5–15.5)
WBC: 7 10*3/uL (ref 4.0–10.5)
nRBC: 0 % (ref 0.0–0.2)

## 2023-08-22 LAB — COMPREHENSIVE METABOLIC PANEL
ALT: 34 U/L (ref 0–44)
AST: 31 U/L (ref 15–41)
Albumin: 3.9 g/dL (ref 3.5–5.0)
Alkaline Phosphatase: 85 U/L (ref 38–126)
Anion gap: 14 (ref 5–15)
BUN: 61 mg/dL — ABNORMAL HIGH (ref 6–20)
CO2: 26 mmol/L (ref 22–32)
Calcium: 9.4 mg/dL (ref 8.9–10.3)
Chloride: 90 mmol/L — ABNORMAL LOW (ref 98–111)
Creatinine, Ser: 2.47 mg/dL — ABNORMAL HIGH (ref 0.44–1.00)
GFR, Estimated: 23 mL/min — ABNORMAL LOW (ref >60)
Glucose, Bld: 327 mg/dL — ABNORMAL HIGH (ref 70–99)
Potassium: 3.1 mmol/L — ABNORMAL LOW (ref 3.5–5.1)
Sodium: 130 mmol/L — ABNORMAL LOW (ref 135–145)
Total Bilirubin: 1.3 mg/dL — ABNORMAL HIGH (ref 0.0–1.2)
Total Protein: 7.9 g/dL (ref 6.5–8.1)

## 2023-08-22 LAB — MAGNESIUM: Magnesium: 2.8 mg/dL — ABNORMAL HIGH (ref 1.7–2.4)

## 2023-08-22 LAB — BRAIN NATRIURETIC PEPTIDE: B Natriuretic Peptide: 9.2 pg/mL (ref 0.0–100.0)

## 2023-08-22 LAB — GLUCOSE, CAPILLARY: Glucose-Capillary: 174 mg/dL — ABNORMAL HIGH (ref 70–99)

## 2023-08-22 LAB — CBG MONITORING, ED: Glucose-Capillary: 189 mg/dL — ABNORMAL HIGH (ref 70–99)

## 2023-08-22 LAB — PHOSPHORUS: Phosphorus: 2.8 mg/dL (ref 2.5–4.6)

## 2023-08-22 LAB — HEMOGLOBIN A1C
Hgb A1c MFr Bld: 8.5 % — ABNORMAL HIGH (ref 4.8–5.6)
Mean Plasma Glucose: 197.25 mg/dL

## 2023-08-22 MED ORDER — ATORVASTATIN CALCIUM 20 MG PO TABS
20.0000 mg | ORAL_TABLET | Freq: Every day | ORAL | Status: DC
  Administered 2023-08-22 – 2023-08-24 (×3): 20 mg via ORAL
  Filled 2023-08-22 (×3): qty 1

## 2023-08-22 MED ORDER — PANTOPRAZOLE SODIUM 40 MG PO TBEC
40.0000 mg | DELAYED_RELEASE_TABLET | Freq: Every day | ORAL | Status: DC
  Administered 2023-08-22 – 2023-08-24 (×3): 40 mg via ORAL
  Filled 2023-08-22 (×3): qty 1

## 2023-08-22 MED ORDER — INSULIN ASPART 100 UNIT/ML IJ SOLN
0.0000 [IU] | Freq: Three times a day (TID) | INTRAMUSCULAR | Status: DC
  Administered 2023-08-22: 2 [IU] via SUBCUTANEOUS
  Administered 2023-08-23: 3 [IU] via SUBCUTANEOUS
  Administered 2023-08-23: 2 [IU] via SUBCUTANEOUS
  Administered 2023-08-23: 3 [IU] via SUBCUTANEOUS
  Administered 2023-08-24: 2 [IU] via SUBCUTANEOUS
  Filled 2023-08-22 (×5): qty 1

## 2023-08-22 MED ORDER — HEPARIN SODIUM (PORCINE) 5000 UNIT/ML IJ SOLN
5000.0000 [IU] | Freq: Three times a day (TID) | INTRAMUSCULAR | Status: DC
  Administered 2023-08-22 – 2023-08-24 (×5): 5000 [IU] via SUBCUTANEOUS
  Filled 2023-08-22 (×5): qty 1

## 2023-08-22 MED ORDER — LATANOPROST 0.005 % OP SOLN
1.0000 [drp] | Freq: Every day | OPHTHALMIC | Status: DC
Start: 2023-08-22 — End: 2023-08-24
  Administered 2023-08-22 – 2023-08-23 (×2): 1 [drp] via OPHTHALMIC
  Filled 2023-08-22 (×2): qty 2.5

## 2023-08-22 MED ORDER — LACTATED RINGERS IV BOLUS
500.0000 mL | Freq: Once | INTRAVENOUS | Status: AC
  Administered 2023-08-22: 500 mL via INTRAVENOUS

## 2023-08-22 MED ORDER — SODIUM CHLORIDE 0.9 % IV SOLN: INTRAVENOUS | Status: AC

## 2023-08-22 MED ORDER — LINAGLIPTIN 5 MG PO TABS
5.0000 mg | ORAL_TABLET | Freq: Every day | ORAL | Status: DC
  Administered 2023-08-23 – 2023-08-24 (×2): 5 mg via ORAL
  Filled 2023-08-22 (×4): qty 1

## 2023-08-22 MED ORDER — ALBUTEROL SULFATE (2.5 MG/3ML) 0.083% IN NEBU: 3.0000 mL | INHALATION_SOLUTION | Freq: Four times a day (QID) | RESPIRATORY_TRACT | Status: DC | PRN

## 2023-08-22 MED ORDER — MEXILETINE HCL 150 MG PO CAPS
300.0000 mg | ORAL_CAPSULE | Freq: Two times a day (BID) | ORAL | Status: DC
  Administered 2023-08-22 – 2023-08-24 (×4): 300 mg via ORAL
  Filled 2023-08-22 (×6): qty 2

## 2023-08-22 MED ORDER — ASPIRIN 81 MG PO TBEC
81.0000 mg | DELAYED_RELEASE_TABLET | Freq: Every day | ORAL | Status: DC
  Administered 2023-08-23 – 2023-08-24 (×2): 81 mg via ORAL
  Filled 2023-08-22 (×3): qty 1

## 2023-08-22 MED ORDER — HYDRALAZINE HCL 20 MG/ML IJ SOLN: 5.0000 mg | Freq: Four times a day (QID) | INTRAMUSCULAR | Status: DC | PRN

## 2023-08-22 MED ORDER — POTASSIUM CHLORIDE CRYS ER 20 MEQ PO TBCR
40.0000 meq | EXTENDED_RELEASE_TABLET | Freq: Once | ORAL | Status: AC
  Administered 2023-08-22: 40 meq via ORAL
  Filled 2023-08-22: qty 2

## 2023-08-22 MED ORDER — ACETAMINOPHEN 650 MG RE SUPP: 650.0000 mg | Freq: Four times a day (QID) | RECTAL | Status: DC | PRN

## 2023-08-22 MED ORDER — ONDANSETRON HCL 4 MG PO TABS
4.0000 mg | ORAL_TABLET | Freq: Four times a day (QID) | ORAL | Status: DC | PRN
Start: 2023-08-22 — End: 2023-08-24

## 2023-08-22 MED ORDER — LORATADINE 10 MG PO TABS
10.0000 mg | ORAL_TABLET | Freq: Every day | ORAL | Status: DC
  Administered 2023-08-23 – 2023-08-24 (×2): 10 mg via ORAL
  Filled 2023-08-22 (×3): qty 1

## 2023-08-22 MED ORDER — OXYBUTYNIN CHLORIDE 5 MG PO TABS
5.0000 mg | ORAL_TABLET | Freq: Two times a day (BID) | ORAL | Status: DC
  Filled 2023-08-22 (×4): qty 1

## 2023-08-22 MED ORDER — ONDANSETRON HCL 4 MG/2ML IJ SOLN: 4.0000 mg | Freq: Four times a day (QID) | INTRAMUSCULAR | Status: DC | PRN

## 2023-08-22 MED ORDER — LOPERAMIDE HCL 2 MG PO CAPS: 2.0000 mg | ORAL_CAPSULE | ORAL | Status: DC | PRN

## 2023-08-22 MED ORDER — ACETAMINOPHEN 325 MG PO TABS
650.0000 mg | ORAL_TABLET | Freq: Four times a day (QID) | ORAL | Status: DC | PRN
  Administered 2023-08-23: 650 mg via ORAL
  Filled 2023-08-22: qty 2

## 2023-08-22 NOTE — ED Notes (Signed)
First Nurse Note: Patient to ED via Pov for abnormal labs. Dc'd 1 week ago for decrease kidney function. 2.6 Cr 22 GFR

## 2023-08-22 NOTE — ED Notes (Signed)
Pt states she was seen by her PCP yesterday and had abnormal labs that came back today. Pt denies chest pain, SOB, and heart palpitations. Hx of kidney disease and CHF.

## 2023-08-22 NOTE — ED Triage Notes (Signed)
C/O being sent to ED due to abnormal blood work drawn yesterday.  Patient denies complaint.

## 2023-08-22 NOTE — H&P (Signed)
History and Physical    [name]:[id number] DOB: [birth year] DOA: 08/22/2023  PCP: [facility] (Confirm with patient/family/NH records and if not entered, this has to be entered at [facility] point of entry) Patient coming from: Home  I have personally briefly reviewed patient's old medical records in [facility]  Chief Complaint: Feeling sick  HPI: Heather Norman is a 48 y.o. 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  Initially, patient came to ED 7 days ago for evaluation of increasing shortness of breath and headache, as well as poorly controlled hypertension, when it was found patient in CHF decompensation.  Patient was given IV diuresis x 1 and instructed to increase her diuresis from torsemide 80 mg to 120-160 mg daily and increased metolazone dosage from once weekly to twice weekly.  Patient went home however she continued to take her regular doses of 80 mg of torsemide but she did increase her metolazone to twice weekly last week.  Occasionally she does feel lightheadedness and when she went to see her cardiology last week she was found that " lost of 10 lbs" since last months and her blood pressure was 89/64 onsite visit.  Patient also reported she drinks 40 ounces water bottle x 3 every day in addition to regular diet fluid intake and she also reported that she has chronic diarrhea, loose, 5-6 times a day, small to moderate amount, without abdominal pain nauseous vomiting.  Denies any foul-smelling stool, no blood or mucous and she is scheduled to see her GI doctor next week about it.   ED Course: Afebrile, no tachycardia blood pressure 103/77 O2 saturation 99% on room air.  Blood work showed creatinine 2.4 compared to baseline 1.4-1.8, sodium 130 potassium 3.1, magnesium 2.6, glucose 327.  Patient was given IV bolus 500 mL x  1 in the ED.  Review of Systems: As per HPI otherwise 14 point review of systems negative.    Past Medical History:  Diagnosis Date   (HFpEF) heart failure with preserved ejection fraction (HCC)    a. 04/2017 Echo: EF 55-60%, no rwma, Mild MR, mildly dil LA. Nl RV fxn; b. 06/2019 Echo: EF 60-65%, mod LVH. Gr2 DD. Mildly dil LA.   Arthritis    kness/hands - no meds   CHF (congestive heart failure) (HCC)    Diabetes mellitus without complication (HCC)    DM (diabetes mellitus) (HCC) 05/11/2017   Dyspnea on exertion    Headache(784.0)    otc med prn   Heart murmur    Hyperlipidemia    no meds since pregnancy   Hypertension    Morbid obesity [facility])     Past Surgical History:  Procedure Laterality Date   CERVICAL CERCLAGE  06/01/2011   Procedure: CERCLAGE CERVICAL;  Surgeon: Philip Aspen, DO;  Location: [facility] ORS;  Service: Gynecology;  Laterality: N/A;  REQUESTING PORTABLE ULTARSOUND AT BEDSIDE PT'S EDC:12/02/2011   CERVICAL CERCLAGE  06/01/2011   Procedure: CERCLAGE CERVICAL;  Surgeon: Philip Aspen, DO;  Location: [facility] ORS;  Service: Gynecology;  Laterality: N/A;  REQUESTING PORTABLE ULTARSOUND AT BEDSIDE PT'S EDC:12/02/2011   endosocpy     RIGHT/LEFT HEART CATH AND CORONARY ANGIOGRAPHY N/A 03/28/2023   Procedure: RIGHT/LEFT HEART CATH AND CORONARY ANGIOGRAPHY;  Surgeon: Dolores Patty, MD;  Location: [facility] INVASIVE CV LAB;  Service: Cardiovascular;  Laterality: N/A;   svd  x 2   tab     x 1   TUBAL LIGATION       reports that she has never smoked. She has never used smokeless tobacco. She reports that she does not drink alcohol and does not use drugs.  Allergies  Allergen Reactions   Penicillins Anaphylaxis    Has patient had a PCN reaction causing immediate rash, facial/tongue/throat swelling, SOB or lightheadedness with hypotension: Yes Has patient had a PCN reaction causing severe rash involving mucus membranes or skin necrosis: No Has patient had a PCN reaction  that required hospitalization: No Has patient had a PCN reaction occurring within the last 10 years: No If all of the above answers are "NO", then may proceed with Cephalosporin use.    Family History  Problem Relation Age of Onset   Diabetes Mother    Hypertension Mother    Hyperlipidemia Mother    Hypertension Father    Breast cancer Maternal Grandmother 60     Prior to Admission medications   Medication Sig Start Date End Date Taking? Authorizing Provider  albuterol (VENTOLIN HFA) 108 (90 Base) MCG/ACT inhaler Inhale 1-2 puffs into the lungs every 6 (six) hours as needed. 07/20/22   Mickie Bail, NP  aluminum-magnesium hydroxide 200-200 MG/5ML suspension Take 15 mLs by mouth every 6 (six) hours as needed for indigestion.    [provider]  aspirin EC 81 MG tablet Take 81 mg by mouth daily.    [provider]  atorvastatin (LIPITOR) 20 MG tablet TAKE 1 TABLET(20 MG) BY MOUTH DAILY 12/26/22   [name], FNP  carvedilol (COREG) 25 MG tablet TAKE 1 TABLET BY MOUTH IN THE MORNING AND 1 AND 1/2 TABLETS IN THE EVENING 02/19/23   [name], MD  empagliflozin (JARDIANCE) 25 MG TABS tablet Take 1 tablet (25 mg total) by mouth daily before breakfast. 01/25/23   Delma Freeze, FNP  famotidine (PEPCID) 20 MG tablet Take 1 tablet (20 mg total) by mouth 2 (two) times daily. 02/20/23   Celso Amy, PA-C  famotidine-calcium carbonate-magnesium hydroxide (PEPCID COMPLETE) 10-800-165 MG chewable tablet Chew 1 tablet by mouth daily as needed.    [provider]  latanoprost (XALATAN) 0.005 % ophthalmic solution Place 1 drop into both eyes at bedtime. 01/17/22   [provider]  loratadine (CLARITIN) 10 MG tablet Take 10 mg by mouth daily. Takes daily    [provider]  meclizine (ANTIVERT) 25 MG tablet Take 1 tablet (25 mg total) by mouth 3 (three) times daily as needed for dizziness or nausea. 08/23/22   Sharman Cheek, MD  metolazone  (ZAROXOLYN) 2.5 MG tablet Take 1 tablet (2.5 mg total) by mouth 2 (two) times a week. 08/16/23   Bensimhon, Bevelyn Buckles, MD  mexiletine (MEXITIL) 150 MG capsule Take 2 capsules (300 mg total) by mouth 2 (two) times daily. 03/26/23   Bensimhon, Bevelyn Buckles, MD  oxybutynin (DITROPAN) 5 MG tablet Take 5 mg by mouth 2 (two) times daily. 12/25/17   [provider]  pantoprazole (PROTONIX) 40 MG tablet Take 1 tablet (40 mg total) by mouth daily. 02/20/23 02/15/24  Celso Amy, PA-C  potassium chloride 20 MEQ/15ML (10%) SOLN Take 45 mLs (60 mEq total) by mouth daily. And additional on the days you take the metolazone 03/27/23   Milford, Anderson Malta, FNP  sacubitril-valsartan (ENTRESTO) 97-103 MG Take 1 tablet by mouth 2 (two) times daily. 12/19/22   Delma Freeze, FNP  Semaglutide,0.25  or 0.5MG /DOS, 2 MG/3ML SOPN Inject 0.25 mg into the skin once a week. 11/06/22   Delma Freeze, FNP  spironolactone (ALDACTONE) 25 MG tablet TAKE 1 TABLET(25 MG) BY MOUTH DAILY 04/13/22   [name], MD  torsemide (DEMADEX) 20 MG tablet Take 4 tablets (80 mg total) by mouth daily. 03/05/23   Jacklynn Ganong, FNP    Physical Exam: Vitals:   08/22/23 1245 08/22/23 1246 08/22/23 1530 08/22/23 1545  BP: 127/78  103/77 (!) 108/53  Pulse: 83  63   Resp: 16  17 15   Temp: 97.8 F (36.6 C)     TempSrc: Oral     SpO2: 97%  99% 99%  Weight:  132 kg      Constitutional: NAD, calm, comfortable Vitals:   08/22/23 1245 08/22/23 1246 08/22/23 1530 08/22/23 1545  BP: 127/78  103/77 (!) 108/53  Pulse: 83  63   Resp: 16  17 15   Temp: 97.8 F (36.6 C)     TempSrc: Oral     SpO2: 97%  99% 99%  Weight:  132 kg     Eyes: PERRL, lids and conjunctivae normal ENMT: Mucous membranes are dry. Posterior pharynx clear of any exudate or lesions.Normal dentition.  Neck: normal, supple, no masses, no thyromegaly Respiratory: clear to auscultation bilaterally, no wheezing, no crackles. Normal respiratory effort. No accessory  muscle use.  Cardiovascular: Regular rate and rhythm, no murmurs / rubs / gallops. No extremity edema. 2+ pedal pulses. No carotid bruits.  Abdomen: no tenderness, no masses palpated. No hepatosplenomegaly. Bowel sounds positive.  Musculoskeletal: no clubbing / cyanosis. No joint deformity upper and lower extremities. Good ROM, no contractures. Normal muscle tone.  Skin: no rashes, lesions, ulcers. No induration Neurologic: CN 2-12 grossly intact. Sensation intact, DTR normal. Strength 5/5 in all 4.  Psychiatric: Normal judgment and insight. Alert and oriented x 3. Normal mood.     Labs on Admission: I have personally reviewed following labs and imaging studies  CBC: Recent Labs  Lab 08/22/23 1248  WBC 7.0  NEUTROABS 3.5  HGB 15.8*  HCT 46.7*  MCV 89.0  PLT 341   Basic Metabolic Panel: Recent Labs  Lab 08/22/23 1248  NA 130*  K 3.1*  CL 90*  CO2 26  GLUCOSE 327*  BUN 61*  CREATININE 2.47*  CALCIUM 9.4  MG 2.8*   GFR: Estimated Creatinine Clearance: 38.3 mL/min (A) (by C-G formula based on SCr of 2.47 mg/dL (H)). Liver Function Tests: Recent Labs  Lab 08/22/23 1248  AST 31  ALT 34  ALKPHOS 85  BILITOT 1.3*  PROT 7.9  ALBUMIN 3.9   No results for input(s): "LIPASE", "AMYLASE" in the last 168 hours. No results for input(s): "AMMONIA" in the last 168 hours. Coagulation Profile: No results for input(s): "INR", "PROTIME" in the last 168 hours. Cardiac Enzymes: No results for input(s): "CKTOTAL", "CKMB", "CKMBINDEX", "TROPONINI" in the last 168 hours. BNP (last 3 results) No results for input(s): "PROBNP" in the last 8760 hours. HbA1C: No results for input(s): "HGBA1C" in the last 72 hours. CBG: No results for input(s): "GLUCAP" in the last 168 hours. Lipid Profile: No results for input(s): "CHOL", "HDL", "LDLCALC", "TRIG", "CHOLHDL", "LDLDIRECT" in the last 72 hours. Thyroid Function Tests: No results for input(s): "TSH", "T4TOTAL", "FREET4", "T3FREE",  "THYROIDAB" in the last 72 hours. Anemia Panel: No results for input(s): "VITAMINB12", "FOLATE", "FERRITIN", "TIBC", "IRON", "RETICCTPCT" in the last 72 hours. Urine analysis:    Component Value Date/Time  COLORURINE STRAW (A) 03/26/2023 2330   APPEARANCEUR CLEAR (A) 03/26/2023 2330   APPEARANCEUR Clear 07/14/2013 1702   LABSPEC 1.015 03/26/2023 2330   LABSPEC 1.011 07/14/2013 1702   PHURINE 5.0 03/26/2023 2330   GLUCOSEU >=500 (A) 03/26/2023 2330   GLUCOSEU Negative 07/14/2013 1702   HGBUR NEGATIVE 03/26/2023 2330   BILIRUBINUR NEGATIVE 03/26/2023 2330   BILIRUBINUR Negative 07/14/2013 1702   KETONESUR NEGATIVE 03/26/2023 2330   PROTEINUR NEGATIVE 03/26/2023 2330   UROBILINOGEN 1.0 07/30/2011 2005   NITRITE NEGATIVE 03/26/2023 2330   LEUKOCYTESUR NEGATIVE 03/26/2023 2330   LEUKOCYTESUR Negative 07/14/2013 1702    Radiological Exams on Admission: DG Chest 2 View Result Date: 08/22/2023 CLINICAL DATA:  Abnormal blood work, history of CHF, acute renal insufficiency EXAM: CHEST - 2 VIEW COMPARISON:  08/14/2023 FINDINGS: Frontal and lateral views of the chest demonstrate a stable cardiac silhouette. Pulmonary vascular congestion is unchanged. No airspace disease, effusion, or pneumothorax. No acute bony abnormalities. IMPRESSION: 1. Stable chronic pulmonary vascular congestion. No acute airspace disease. Electronically Signed   By: Sharlet Salina M.D.   On: 08/22/2023 15:44    EKG: Independently reviewed.  Sinus rhythm, frequent PVCs, no acute ST changes.  Assessment/Plan Principal Problem:   AKI (acute kidney injury) (HCC)  (please populate well all problems here in Problem List. (For example, if patient is on BP meds at home and you resume or decide to hold them, it is a problem that needs to be her. Same for CAD, COPD, HLD and so on)  AKI on CKD stage II -Clinically appears to have significant volume contraction and dehydration probably secondary to overdiuresis as well as  prolonged hypotension -Patient is getting initial IV bolus, will continue IV hydration tonight and recheck kidney function tomorrow -Hold off all her BP/CHF medication including Coreg, torsemide Entresto and Aldactone  Hypokalemia -P.o. replacement x 2, recheck K level, -Magnesium> 2.0, will check phosphorus level  Chronic diarrhea -Will send GI pathogen panel to rule out C. Difficile -Start Imodium as needed -Outpatient GI follow-up next week -Consider drug-induced including semaglutide?  Outpatient follow-up with GI and endocrinology  IIDM, hyperglycemia -Hold off Jardiance given there is a kidney function surge -Start Januvia, start SSI  Chronic HFpEF HTN -Volume depleted, hold off CHF/BP medications as above. -Start as needed hydralazine  PVCs -Chronic, continue mexiletine -Correct hypokalemia  Morbid obesity -BMI=48 -Patient on Ozempic   DVT prophylaxis: Heparin subcu Code Status: Full code Family Communication: None at bedside Disposition Plan: Expect less than 2 midnight hospital stay Consults called: None Admission status: Telemetry observation   Emeline General MD [facility] Pager [phone]  08/22/2023, 4:44 PM

## 2023-08-22 NOTE — Telephone Encounter (Signed)
Left message to call office so we can schedule an appointment with Inetta Fermo to discuss the below.   Celso Amy, PA-C  Jodi Marble, NP; Martyn Ehrich, CMA She was scheduled for EGD 04/03/23 with Dr. Allegra Lai for intolerable acid reflux, however she canceled EGD due to panic attacks and fear of being sedated.  Pt. Previously declined Colonoscopy.  Last GI OV 05/2023 to f/u GERD.  I think it would be best to schedule f/u OV with me to discuss scheduling EGD Plus Colonoscopy and go over prep instructions and make sure she is comfortable with procedures.  Velna Hatchet, please call pt. To schedule f/u OV. Thank you. Celso Amy, PA-C

## 2023-08-22 NOTE — ED Provider Notes (Signed)
[facility] Provider Note    Event Date/Time   First MD Initiated Contact with Patient 08/22/23 1511     (approximate)   History   No chief complaint on file.   HPI  Heather Norman is a 49 y.o. female who presents to the ED for evaluation of No chief complaint on file.   Review of cardiology clinic visit from 6 days ago.  Morbidly obese patient with history of DM, HTN, HLD and diastolic dysfunction.  Patient presents for evaluation of abnormal kidney function on routine PCP blood work from yesterday.  She reports dry mouth and generalized weakness for the past few days.  No orthopnea, fever or chest pain   Physical Exam   Triage Vital Signs: ED Triage Vitals  Encounter Vitals Group     BP 08/22/23 1245 127/78     Systolic BP Percentile --      Diastolic BP Percentile --      Pulse Rate 08/22/23 1245 83     Resp 08/22/23 1245 16     Temp 08/22/23 1245 97.8 F (36.6 C)     Temp Source 08/22/23 1245 Oral     SpO2 08/22/23 1245 97 %     Weight 08/22/23 1246 291 lb 0.1 oz (132 kg)     Height --      Head Circumference --      Peak Flow --      Pain Score 08/22/23 1246 0     Pain Loc --      Pain Education --      Exclude from Growth Chart --     Most recent vital signs: Vitals:   08/22/23 1530 08/22/23 1545  BP: 103/77 (!) 108/53  Pulse: 63   Resp: 17 15  Temp:    SpO2: 99% 99%    General: Awake, no distress.  CV:  Good peripheral perfusion.  Resp:  Normal effort.  Abd:  No distention.  MSK:  No deformity noted.  No significant edema Neuro:  No focal deficits appreciated. Other:     ED Results / Procedures / Treatments   Labs (all labs ordered are listed, but only abnormal results are displayed) Labs Reviewed  CBC WITH DIFFERENTIAL/PLATELET - Abnormal; Notable for the following components:      Result Value   RBC 5.25 (*)    Hemoglobin 15.8 (*)    HCT 46.7 (*)    All other components within normal limits  COMPREHENSIVE  METABOLIC PANEL - Abnormal; Notable for the following components:   Sodium 130 (*)    Potassium 3.1 (*)    Chloride 90 (*)    Glucose, Bld 327 (*)    BUN 61 (*)    Creatinine, Ser 2.47 (*)    Total Bilirubin 1.3 (*)    GFR, Estimated 23 (*)    All other components within normal limits  MAGNESIUM - Abnormal; Notable for the following components:   Magnesium 2.8 (*)    All other components within normal limits  GASTROINTESTINAL PANEL BY PCR, STOOL (REPLACES STOOL CULTURE)  BRAIN NATRIURETIC PEPTIDE  HIV ANTIBODY (ROUTINE TESTING W REFLEX)  PHOSPHORUS    EKG Sinus rhythm the rate of 84 bpm.  Normal axis.  PVCs.  No clear signs of acute ischemia  RADIOLOGY CXR interpreted by me without evidence of acute cardiopulmonary pathology.  Official radiology report(s): DG Chest 2 View Result Date: 08/22/2023 CLINICAL DATA:  Abnormal blood work, history of CHF, acute renal  insufficiency EXAM: CHEST - 2 VIEW COMPARISON:  08/14/2023 FINDINGS: Frontal and lateral views of the chest demonstrate a stable cardiac silhouette. Pulmonary vascular congestion is unchanged. No airspace disease, effusion, or pneumothorax. No acute bony abnormalities. IMPRESSION: 1. Stable chronic pulmonary vascular congestion. No acute airspace disease. Electronically Signed   By: Sharlet Salina M.D.   On: 08/22/2023 15:44    PROCEDURES and INTERVENTIONS:  .1-3 Lead EKG Interpretation  Performed by: Delton Prairie, MD Authorized by: Delton Prairie, MD     Interpretation: normal     ECG rate:  60   ECG rate assessment: normal     Rhythm: sinus rhythm     Ectopy: PVCs     Conduction: normal     Medications  0.9 %  sodium chloride infusion (has no administration in time range)  aspirin EC tablet 81 mg (has no administration in time range)  atorvastatin (LIPITOR) tablet 20 mg (has no administration in time range)  mexiletine (MEXITIL) capsule 300 mg (has no administration in time range)  pantoprazole (PROTONIX) EC  tablet 40 mg (has no administration in time range)  oxybutynin (DITROPAN) tablet 5 mg (has no administration in time range)  albuterol (VENTOLIN HFA) 108 (90 Base) MCG/ACT inhaler 1-2 puff (has no administration in time range)  loratadine (CLARITIN) tablet 10 mg (has no administration in time range)  latanoprost (XALATAN) 0.005 % ophthalmic solution 1 drop (has no administration in time range)  hydrALAZINE (APRESOLINE) injection 5 mg (has no administration in time range)  loperamide (IMODIUM) capsule 2 mg (has no administration in time range)  heparin injection 5,000 Units (has no administration in time range)  ondansetron (ZOFRAN) tablet 4 mg (has no administration in time range)    Or  ondansetron (ZOFRAN) injection 4 mg (has no administration in time range)  acetaminophen (TYLENOL) tablet 650 mg (has no administration in time range)    Or  acetaminophen (TYLENOL) suppository 650 mg (has no administration in time range)  potassium chloride SA (KLOR-CON M) CR tablet 40 mEq (has no administration in time range)  potassium chloride SA (KLOR-CON M) CR tablet 40 mEq (40 mEq Oral Given 08/22/23 1546)  lactated ringers bolus 500 mL (500 mLs Intravenous New Bag/Given 08/22/23 1631)     IMPRESSION / MDM / ASSESSMENT AND PLAN / ED COURSE  I reviewed the triage vital signs and the nursing notes.  Differential diagnosis includes, but is not limited to, CHF and cardiorenal syndrome, overdiuresis and AKI, ureteral obstruction  {Patient presents with symptoms of an acute illness or injury that is potentially life-threatening.  Patient with CHF and recent additional diuresis presents with an AKI, likely overdiuresis requiring medical admission.  Soft blood pressures but stable and look systemically well.  Not grossly volume overload.  BNP is now low where it was high week ago.  CXR with mild hypokalemia.  Initiate replacement orally.  Hyperglycemia without a gap and no signs of DKA.  Normal CBC.  Start  with a tiny fluid bolus and consult medicine for admission.  Clinical Course as of 08/22/23 1646  Wed Aug 22, 2023  1608 Cotton mouth. An extra metolazone this week [DS]  1620 Consult with hospitalist who agrees to admit [DS]    Clinical Course User Index [DS] Delton Prairie, MD     FINAL CLINICAL IMPRESSION(S) / ED DIAGNOSES   Final diagnoses:  AKI (acute kidney injury) (HCC)  Hypokalemia     Rx / DC Orders   ED Discharge Orders  None        Note:  This document was prepared using Dragon voice recognition software and may include unintentional dictation errors.    Delton Prairie, MD 08/22/23 [phone]

## 2023-08-23 ENCOUNTER — Encounter: Payer: Self-pay | Admitting: Internal Medicine

## 2023-08-23 DIAGNOSIS — I959 Hypotension, unspecified: Secondary | ICD-10-CM | POA: Diagnosis present

## 2023-08-23 DIAGNOSIS — K529 Noninfective gastroenteritis and colitis, unspecified: Secondary | ICD-10-CM | POA: Diagnosis present

## 2023-08-23 DIAGNOSIS — Z7982 Long term (current) use of aspirin: Secondary | ICD-10-CM | POA: Diagnosis not present

## 2023-08-23 DIAGNOSIS — E876 Hypokalemia: Secondary | ICD-10-CM | POA: Diagnosis present

## 2023-08-23 DIAGNOSIS — N182 Chronic kidney disease, stage 2 (mild): Secondary | ICD-10-CM | POA: Diagnosis present

## 2023-08-23 DIAGNOSIS — Z8249 Family history of ischemic heart disease and other diseases of the circulatory system: Secondary | ICD-10-CM | POA: Diagnosis not present

## 2023-08-23 DIAGNOSIS — N179 Acute kidney failure, unspecified: Secondary | ICD-10-CM | POA: Diagnosis present

## 2023-08-23 DIAGNOSIS — Z7985 Long-term (current) use of injectable non-insulin antidiabetic drugs: Secondary | ICD-10-CM | POA: Diagnosis not present

## 2023-08-23 DIAGNOSIS — Z1152 Encounter for screening for COVID-19: Secondary | ICD-10-CM | POA: Diagnosis not present

## 2023-08-23 DIAGNOSIS — Z83438 Family history of other disorder of lipoprotein metabolism and other lipidemia: Secondary | ICD-10-CM | POA: Diagnosis not present

## 2023-08-23 DIAGNOSIS — E785 Hyperlipidemia, unspecified: Secondary | ICD-10-CM | POA: Diagnosis present

## 2023-08-23 DIAGNOSIS — I493 Ventricular premature depolarization: Secondary | ICD-10-CM | POA: Diagnosis present

## 2023-08-23 DIAGNOSIS — Z6841 Body Mass Index (BMI) 40.0 and over, adult: Secondary | ICD-10-CM | POA: Diagnosis not present

## 2023-08-23 DIAGNOSIS — T502X5A Adverse effect of carbonic-anhydrase inhibitors, benzothiadiazides and other diuretics, initial encounter: Secondary | ICD-10-CM | POA: Diagnosis present

## 2023-08-23 DIAGNOSIS — Z833 Family history of diabetes mellitus: Secondary | ICD-10-CM | POA: Diagnosis not present

## 2023-08-23 DIAGNOSIS — Z7984 Long term (current) use of oral hypoglycemic drugs: Secondary | ICD-10-CM | POA: Diagnosis not present

## 2023-08-23 DIAGNOSIS — Z803 Family history of malignant neoplasm of breast: Secondary | ICD-10-CM | POA: Diagnosis not present

## 2023-08-23 DIAGNOSIS — I130 Hypertensive heart and chronic kidney disease with heart failure and stage 1 through stage 4 chronic kidney disease, or unspecified chronic kidney disease: Secondary | ICD-10-CM | POA: Diagnosis present

## 2023-08-23 DIAGNOSIS — I5032 Chronic diastolic (congestive) heart failure: Secondary | ICD-10-CM | POA: Diagnosis present

## 2023-08-23 DIAGNOSIS — E1122 Type 2 diabetes mellitus with diabetic chronic kidney disease: Secondary | ICD-10-CM | POA: Diagnosis present

## 2023-08-23 DIAGNOSIS — E860 Dehydration: Secondary | ICD-10-CM | POA: Diagnosis present

## 2023-08-23 DIAGNOSIS — E1165 Type 2 diabetes mellitus with hyperglycemia: Secondary | ICD-10-CM | POA: Diagnosis present

## 2023-08-23 DIAGNOSIS — E66813 Obesity, class 3: Secondary | ICD-10-CM | POA: Diagnosis present

## 2023-08-23 LAB — BASIC METABOLIC PANEL
Anion gap: 13 (ref 5–15)
BUN: 59 mg/dL — ABNORMAL HIGH (ref 6–20)
CO2: 30 mmol/L (ref 22–32)
Calcium: 8.8 mg/dL — ABNORMAL LOW (ref 8.9–10.3)
Chloride: 93 mmol/L — ABNORMAL LOW (ref 98–111)
Creatinine, Ser: 2.41 mg/dL — ABNORMAL HIGH (ref 0.44–1.00)
GFR, Estimated: 24 mL/min — ABNORMAL LOW (ref >60)
Glucose, Bld: 192 mg/dL — ABNORMAL HIGH (ref 70–99)
Potassium: 3 mmol/L — ABNORMAL LOW (ref 3.5–5.1)
Sodium: 136 mmol/L (ref 135–145)

## 2023-08-23 LAB — GASTROINTESTINAL PANEL BY PCR, STOOL (REPLACES STOOL CULTURE)

## 2023-08-23 LAB — CBC
HCT: 44.8 % (ref 36.0–46.0)
Hemoglobin: 14.9 g/dL (ref 12.0–15.0)
MCH: 29.9 pg (ref 26.0–34.0)
MCHC: 33.3 g/dL (ref 30.0–36.0)
MCV: 90 fL (ref 80.0–100.0)
Platelets: 291 10*3/uL (ref 150–400)
RBC: 4.98 MIL/uL (ref 3.87–5.11)
RDW: 13.8 % (ref 11.5–15.5)
WBC: 6.4 10*3/uL (ref 4.0–10.5)
nRBC: 0 % (ref 0.0–0.2)

## 2023-08-23 LAB — GLUCOSE, CAPILLARY
Glucose-Capillary: 214 mg/dL — ABNORMAL HIGH (ref 70–99)
Glucose-Capillary: 224 mg/dL — ABNORMAL HIGH (ref 70–99)
Glucose-Capillary: 188 mg/dL — ABNORMAL HIGH (ref 70–99)

## 2023-08-23 LAB — HIV ANTIBODY (ROUTINE TESTING W REFLEX): HIV Screen 4th Generation wRfx: NONREACTIVE

## 2023-08-23 MED ORDER — SODIUM CHLORIDE 0.9 % IV SOLN: INTRAVENOUS | Status: AC

## 2023-08-23 MED ORDER — ALBUTEROL SULFATE (2.5 MG/3ML) 0.083% IN NEBU: 3.0000 mL | INHALATION_SOLUTION | RESPIRATORY_TRACT | Status: DC | PRN

## 2023-08-23 MED ORDER — POTASSIUM CHLORIDE CRYS ER 20 MEQ PO TBCR
40.0000 meq | EXTENDED_RELEASE_TABLET | ORAL | Status: AC
  Administered 2023-08-23 (×2): 40 meq via ORAL
  Filled 2023-08-23 (×2): qty 2

## 2023-08-23 MED ORDER — FLUTICASONE PROPIONATE 50 MCG/ACT NA SUSP
2.0000 | Freq: Every day | NASAL | Status: DC
  Administered 2023-08-23: 2 via NASAL
  Filled 2023-08-23: qty 16

## 2023-08-23 NOTE — Plan of Care (Signed)

## 2023-08-23 NOTE — Progress Notes (Signed)
PROGRESS NOTE    Heather Norman  URK:[id number] DOB: [birth year] DOA: 08/22/2023 PCP: [facility]    Brief Narrative:  48 y.o. 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   Initially, patient came to ED 7 days ago for evaluation of increasing shortness of breath and headache, as well as poorly controlled hypertension, when it was found patient in CHF decompensation.  Patient was given IV diuresis x 1 and instructed to increase her diuresis from torsemide 80 mg to 120-160 mg daily and increased metolazone dosage from once weekly to twice weekly.  Patient went home however she continued to take her regular doses of 80 mg of torsemide but she did increase her metolazone to twice weekly last week.  Occasionally she does feel lightheadedness and when she went to see her cardiology last week she was found that " lost of 10 lbs" since last months and her blood pressure was 89/64 onsite visit.   Patient also reported she drinks 40 ounces water bottle x 3 every day in addition to regular diet fluid intake and she also reported that she has chronic diarrhea, loose, 5-6 times a day, small to moderate amount, without abdominal pain nauseous vomiting.  Denies any foul-smelling stool, no blood or mucous and she is scheduled to see her GI doctor next week about it.   Assessment & Plan:   Principal Problem:   AKI (acute kidney injury) (HCC) AKI on CKD stage II -Clinically appears to have significant volume contraction and dehydration probably secondary to overdiuresis as well as prolonged hypotension patient's home dose of metolazone was recently increased to twice weekly Plan: Continue gentle IVF for next 12 hours Reassess kidney function in a.m. If creatinine less than 2 anticipate discharge 1/24 Continue holding BP meds for now   Hypokalemia Monitor  and replace as necessary   Chronic diarrhea Doubt infectious etiology.  GI PCR panel sent and pending.  Outpatient GI follow-up next week.  As needed Imodium   IIDM, hyperglycemia Hold Jardiance -Start Januvia, start SSI   Chronic HFpEF HTN No evidence of exacerbation.  Patient appears volume depleted.  Blood pressure medications and diuretics on hold.  As needed hydralazine.  Outpatient follow-up with heart failure/cardiology.   Frequent PVCs -Chronic, continue mexiletine -Correct hypokalemia   Morbid obesity -BMI=48 -Patient on Ozempic -Complicating factor in overall care and prognosis    DVT prophylaxis: SQ heparin Code Status: Full Family Communication: None Disposition Plan: Status is: Observation The patient will require care spanning > 2 midnights and should be moved to inpatient because: AKI on CKD.  Creatinine not at baseline.  Needs IV fluids and continued inpatient monitoring for next 24 hours.  Anticipate discharge 1/24.   Level of care: Telemetry Medical  Consultants:  None  Procedures:  None  Antimicrobials: None   Subjective: Seen and examined peer resting in bed.  No visible distress.  No complaints of pain.  Objective: Vitals:   08/22/23 2318 08/22/23 2327 08/23/23 0445 08/23/23 0829  BP: 117/84 111/81 118/69 (!) 133/54  Pulse: 79 63 68 81  Resp:   18 18  Temp:   (!) 97.5 F (36.4 C) 97.8 F (36.6 C)  TempSrc:   Oral Oral  SpO2: 97% 97% 95% 98%  Weight:        Intake/Output Summary (Last 24 hours) at 08/23/2023 1149 Last data filed at 08/23/2023  0431 Gross per 24 hour  Intake 448.81 ml  Output --  Net 448.81 ml   Filed Weights   08/22/23 1246  Weight: 132 kg    Examination:  General exam: No acute distress Respiratory system: Clear to auscultation. Respiratory effort normal. Cardiovascular system: S1-S2, RRR, no murmurs, no pedal edema Gastrointestinal system: Obese, soft, NT/ND, normal bowel sounds Central nervous system:  Alert and oriented. No focal neurological deficits. Extremities: Symmetric 5 x 5 power. Skin: No rashes, lesions or ulcers Psychiatry: Judgement and insight appear normal. Mood & affect appropriate.     Data Reviewed: I have personally reviewed following labs and imaging studies  CBC: Recent Labs  Lab 08/22/23 1248 08/23/23 0628  WBC 7.0 6.4  NEUTROABS 3.5  --   HGB 15.8* 14.9  HCT 46.7* 44.8  MCV 89.0 90.0  PLT 341 291   Basic Metabolic Panel: Recent Labs  Lab 08/22/23 1248 08/23/23 0628  NA 130* 136  K 3.1* 3.0*  CL 90* 93*  CO2 26 30  GLUCOSE 327* 192*  BUN 61* 59*  CREATININE 2.47* 2.41*  CALCIUM 9.4 8.8*  MG 2.8*  --   PHOS 2.8  --    GFR: Estimated Creatinine Clearance: 39.2 mL/min (A) (by C-G formula based on SCr of 2.41 mg/dL (H)). Liver Function Tests: Recent Labs  Lab 08/22/23 1248  AST 31  ALT 34  ALKPHOS 85  BILITOT 1.3*  PROT 7.9  ALBUMIN 3.9   No results for input(s): "LIPASE", "AMYLASE" in the last 168 hours. No results for input(s): "AMMONIA" in the last 168 hours. Coagulation Profile: No results for input(s): "INR", "PROTIME" in the last 168 hours. Cardiac Enzymes: No results for input(s): "CKTOTAL", "CKMB", "CKMBINDEX", "TROPONINI" in the last 168 hours. BNP (last 3 results) No results for input(s): "PROBNP" in the last 8760 hours. HbA1C: Recent Labs    08/22/23 1816  HGBA1C 8.5*   CBG: Recent Labs  Lab 08/22/23 1811 08/22/23 2311 08/23/23 0816 08/23/23 1123  GLUCAP 189* 174* 214* 224*   Lipid Profile: No results for input(s): "CHOL", "HDL", "LDLCALC", "TRIG", "CHOLHDL", "LDLDIRECT" in the last 72 hours. Thyroid Function Tests: No results for input(s): "TSH", "T4TOTAL", "FREET4", "T3FREE", "THYROIDAB" in the last 72 hours. Anemia Panel: No results for input(s): "VITAMINB12", "FOLATE", "FERRITIN", "TIBC", "IRON", "RETICCTPCT" in the last 72 hours. Sepsis Labs: No results for input(s): "PROCALCITON", "LATICACIDVEN" in the  last 168 hours.  Recent Results (from the past 240 hours)  Resp panel by RT-PCR (RSV, Flu A&B, Covid) Anterior Nasal Swab     Status: None   Collection Time: 08/14/23 12:48 PM   Specimen: Anterior Nasal Swab  Result Value Ref Range Status   SARS Coronavirus 2 by RT PCR NEGATIVE NEGATIVE Final    Comment: (NOTE) SARS-CoV-2 target nucleic acids are NOT DETECTED.  The SARS-CoV-2 RNA is generally detectable in upper respiratory specimens during the acute phase of infection. The lowest concentration of SARS-CoV-2 viral copies this assay can detect is 138 copies/mL. A negative result does not preclude SARS-Cov-2 infection and should not be used as the sole basis for treatment or other patient management decisions. A negative result may occur with  improper specimen collection/handling, submission of specimen other than nasopharyngeal swab, presence of viral mutation(s) within the areas targeted by this assay, and inadequate number of viral copies(<138 copies/mL). A negative result must be combined with clinical observations, patient history, and epidemiological information. The expected result is Negative.  Fact Sheet for Patients:  BloggerCourse.com  Fact  Sheet for Healthcare Providers:  SeriousBroker.it  This test is no t yet approved or cleared by the [place] FDA and  has been authorized for detection and/or diagnosis of SARS-CoV-2 by FDA under an Emergency Use Authorization (EUA). This EUA will remain  in effect (meaning this test can be used) for the duration of the COVID-19 declaration under Section 564(b)(1) of the Act, 21 U.S.C.section 360bbb-3(b)(1), unless the authorization is terminated  or revoked sooner.       Influenza A by PCR NEGATIVE NEGATIVE Final   Influenza B by PCR NEGATIVE NEGATIVE Final    Comment: (NOTE) The Xpert Xpress SARS-CoV-2/FLU/RSV plus assay is intended as an aid in the diagnosis of influenza  from Nasopharyngeal swab specimens and should not be used as a sole basis for treatment. Nasal washings and aspirates are unacceptable for Xpert Xpress SARS-CoV-2/FLU/RSV testing.  Fact Sheet for Patients: BloggerCourse.com  Fact Sheet for Healthcare Providers: SeriousBroker.it  This test is not yet approved or cleared by the [place] FDA and has been authorized for detection and/or diagnosis of SARS-CoV-2 by FDA under an Emergency Use Authorization (EUA). This EUA will remain in effect (meaning this test can be used) for the duration of the COVID-19 declaration under Section 564(b)(1) of the Act, 21 U.S.C. section 360bbb-3(b)(1), unless the authorization is terminated or revoked.     Resp Syncytial Virus by PCR NEGATIVE NEGATIVE Final    Comment: (NOTE) Fact Sheet for Patients: BloggerCourse.com  Fact Sheet for Healthcare Providers: SeriousBroker.it  This test is not yet approved or cleared by the [place] FDA and has been authorized for detection and/or diagnosis of SARS-CoV-2 by FDA under an Emergency Use Authorization (EUA). This EUA will remain in effect (meaning this test can be used) for the duration of the COVID-19 declaration under Section 564(b)(1) of the Act, 21 U.S.C. section 360bbb-3(b)(1), unless the authorization is terminated or revoked.  Performed at [facility], [address] [place], [place] [address]          Radiology Studies: DG Chest 2 View Result Date: 08/22/2023 CLINICAL DATA:  Abnormal blood work, history of CHF, acute renal insufficiency EXAM: CHEST - 2 VIEW COMPARISON:  08/14/2023 FINDINGS: Frontal and lateral views of the chest demonstrate a stable cardiac silhouette. Pulmonary vascular congestion is unchanged. No airspace disease, effusion, or pneumothorax. No acute bony abnormalities. IMPRESSION: 1. Stable chronic  pulmonary vascular congestion. No acute airspace disease. Electronically Signed   By: Sharlet Salina M.D.   On: 08/22/2023 15:44        Scheduled Meds:  aspirin EC  81 mg Oral Daily   atorvastatin  20 mg Oral Daily   fluticasone  2 spray Each Nare Daily   heparin  5,000 Units Subcutaneous Q8H   insulin aspart  0-9 Units Subcutaneous TID WC   latanoprost  1 drop Both Eyes QHS   linagliptin  5 mg Oral Daily   loratadine  10 mg Oral Daily   mexiletine  300 mg Oral BID   oxybutynin  5 mg Oral BID   pantoprazole  40 mg Oral Daily   potassium chloride  40 mEq Oral Q2H   Continuous Infusions:  sodium chloride       LOS: 0 days     Tresa Moore, MD [facility]   If 7PM-7AM, please contact night-coverage  08/23/2023, 11:49 AM

## 2023-08-23 NOTE — Plan of Care (Signed)
  Problem: Nutritional: Goal: Maintenance of adequate nutrition will improve Outcome: Progressing   Problem: Tissue Perfusion: Goal: Adequacy of tissue perfusion will improve Outcome: Progressing   Problem: Nutrition: Goal: Adequate nutrition will be maintained Outcome: Progressing

## 2023-08-24 DIAGNOSIS — N179 Acute kidney failure, unspecified: Secondary | ICD-10-CM | POA: Diagnosis not present

## 2023-08-24 LAB — BASIC METABOLIC PANEL
Anion gap: 10 (ref 5–15)
BUN: 41 mg/dL — ABNORMAL HIGH (ref 6–20)
CO2: 29 mmol/L (ref 22–32)
Calcium: 9 mg/dL (ref 8.9–10.3)
Chloride: 99 mmol/L (ref 98–111)
Creatinine, Ser: 1.59 mg/dL — ABNORMAL HIGH (ref 0.44–1.00)
GFR, Estimated: 40 mL/min — ABNORMAL LOW (ref >60)
Glucose, Bld: 214 mg/dL — ABNORMAL HIGH (ref 70–99)
Potassium: 3.6 mmol/L (ref 3.5–5.1)
Sodium: 138 mmol/L (ref 135–145)

## 2023-08-24 LAB — GLUCOSE, CAPILLARY
Glucose-Capillary: 139 mg/dL — ABNORMAL HIGH (ref 70–99)
Glucose-Capillary: 165 mg/dL — ABNORMAL HIGH (ref 70–99)

## 2023-08-24 MED ORDER — METOLAZONE 2.5 MG PO TABS: 2.5000 mg | ORAL_TABLET | ORAL | Status: DC

## 2023-08-24 NOTE — Discharge Summary (Signed)
Physician Discharge Summary  Heather Norman QIO:[id number] DOB: [birth year] DOA: 08/22/2023  PCP: [facility]  Admit date: 08/22/2023 Discharge date: 08/24/2023  Admitted From: Home Disposition:  Home  Recommendations for Outpatient Follow-up:  Follow up with PCP in 1-2 weeks Follow-up with nephrology 2 weeks  Home Health: No Equipment/Devices: None  Discharge Condition: Stable CODE STATUS: Full Diet recommendation: Heart healthy/low-sodium  Brief/Interim Summary:   48 y.o. 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   Initially, patient came to ED 7 days ago for evaluation of increasing shortness of breath and headache, as well as poorly controlled hypertension, when it was found patient in CHF decompensation.  Patient was given IV diuresis x 1 and instructed to increase her diuresis from torsemide 80 mg to 120-160 mg daily and increased metolazone dosage from once weekly to twice weekly.  Patient went home however she continued to take her regular doses of 80 mg of torsemide but she did increase her metolazone to twice weekly last week.  Occasionally she does feel lightheadedness and when she went to see her cardiology last week she was found that " lost of 10 lbs" since last months and her blood pressure was 89/64 onsite visit.   Patient also reported she drinks 40 ounces water bottle x 3 every day in addition to regular diet fluid intake and she also reported that she has chronic diarrhea, loose, 5-6 times a day, small to moderate amount, without abdominal pain nauseous vomiting.  Denies any foul-smelling stool, no blood or mucous and she is scheduled to see her GI doctor next week about it.    At time of discharge kidney function approaching baseline.  Lengthy discussion with patient at bedside and sister over the phone regarding  dietary and fluid restrictions in the setting of chronic heart failure.  Advised patient to consume no more than 1.5 L of fluid daily and to monitor salt intake.  Patient expressed understanding.  At time of discharge will revert back to torsemide once weekly dosing.  Patient will see PCP, cardiology, nephrology on discharge.   Discharge Diagnoses:  Principal Problem:   AKI (acute kidney injury) (HCC)  AKI on CKD stage II -Clinically appears to have significant volume contraction and dehydration probably secondary to overdiuresis as well as prolonged hypotension patient's home dose of metolazone was recently increased to twice weekly Plan: IVF discontinued.  Kidney function approaching baseline.  Patient stable for discharge.  Advise resuming previous metolazone dosing of once weekly.  Follow-up outpatient PCP, cardiology, nephrology.     Discharge Instructions  Discharge Instructions     Diet - low sodium heart healthy   Complete by: As directed    Increase activity slowly   Complete by: As directed       Allergies as of 08/24/2023       Reactions   Penicillins Anaphylaxis   Has patient had a PCN reaction causing immediate rash, facial/tongue/throat swelling, SOB or lightheadedness with hypotension: Yes Has patient had a PCN reaction causing severe rash involving mucus membranes or skin necrosis: No Has patient had a PCN reaction that required hospitalization: No Has patient had a PCN reaction occurring within the last 10 years: No If all of the above answers are "NO", then may proceed with Cephalosporin use.        Medication List     TAKE these medications  albuterol 108 (90 Base) MCG/ACT inhaler Commonly known as: VENTOLIN HFA Inhale 1-2 puffs into the lungs every 6 (six) hours as needed.   aluminum-magnesium hydroxide 200-200 MG/5ML suspension Take 15 mLs by mouth every 6 (six) hours as needed for indigestion.   aspirin EC 81 MG tablet Take 81 mg by mouth  daily.   atorvastatin 20 MG tablet Commonly known as: LIPITOR TAKE 1 TABLET(20 MG) BY MOUTH DAILY   carvedilol 25 MG tablet Commonly known as: COREG TAKE 1 TABLET BY MOUTH IN THE MORNING AND 1 AND 1/2 TABLETS IN THE EVENING   empagliflozin 25 MG Tabs tablet Commonly known as: Jardiance Take 1 tablet (25 mg total) by mouth daily before breakfast.   Entresto 97-103 MG Generic drug: sacubitril-valsartan Take 1 tablet by mouth 2 (two) times daily.   famotidine 20 MG tablet Commonly known as: PEPCID Take 1 tablet (20 mg total) by mouth 2 (two) times daily.   famotidine-calcium carbonate-magnesium hydroxide 10-800-165 MG chewable tablet Commonly known as: PEPCID COMPLETE Chew 1 tablet by mouth daily as needed.   latanoprost 0.005 % ophthalmic solution Commonly known as: XALATAN Place 1 drop into both eyes at bedtime.   loratadine 10 MG tablet Commonly known as: CLARITIN Take 10 mg by mouth daily. Takes daily   meclizine 25 MG tablet Commonly known as: ANTIVERT Take 1 tablet (25 mg total) by mouth 3 (three) times daily as needed for dizziness or nausea.   metolazone 2.5 MG tablet Commonly known as: ZAROXOLYN Take 1 tablet (2.5 mg total) by mouth once a week. What changed: when to take this   mexiletine 150 MG capsule Commonly known as: MEXITIL Take 2 capsules (300 mg total) by mouth 2 (two) times daily.   oxybutynin 5 MG tablet Commonly known as: DITROPAN Take 5 mg by mouth 2 (two) times daily.   pantoprazole 40 MG tablet Commonly known as: PROTONIX Take 1 tablet (40 mg total) by mouth daily.   potassium chloride 20 MEQ/15ML (10%) Soln Take 45 mLs (60 mEq total) by mouth daily. And additional on the days you take the metolazone   Semaglutide(0.25 or 0.5MG /DOS) 2 MG/3ML Sopn Inject 0.25 mg into the skin once a week.   spironolactone 25 MG tablet Commonly known as: ALDACTONE TAKE 1 TABLET(25 MG) BY MOUTH DAILY   torsemide 20 MG tablet Commonly known as:  DEMADEX Take 4 tablets (80 mg total) by mouth daily.        Follow-up Information     [facility]. Go on 08/28/2023.   Why: 08/28/2023 @ 1pm. Please bring discharge paperwork from hospital Contact information: [address] [place] [address] [phone]         Lamont Dowdy, MD. Schedule an appointment as soon as possible for a visit in 2 week(s).   Specialty: Nephrology Contact information: [address] [place] [address] [phone]                Allergies  Allergen Reactions   Penicillins Anaphylaxis    Has patient had a PCN reaction causing immediate rash, facial/tongue/throat swelling, SOB or lightheadedness with hypotension: Yes Has patient had a PCN reaction causing severe rash involving mucus membranes or skin necrosis: No Has patient had a PCN reaction that required hospitalization: No Has patient had a PCN reaction occurring within the last 10 years: No If all of the above answers are "NO", then may proceed with Cephalosporin use.    Consultations: None  Procedures/Studies: DG Chest 2 View Result Date: 08/22/2023 CLINICAL DATA:  Abnormal blood work, history of CHF, acute renal insufficiency EXAM: CHEST - 2 VIEW COMPARISON:  08/14/2023 FINDINGS: Frontal and lateral views of the chest demonstrate a stable cardiac silhouette. Pulmonary vascular congestion is unchanged. No airspace disease, effusion, or pneumothorax. No acute bony abnormalities. IMPRESSION: 1. Stable chronic pulmonary vascular congestion. No acute airspace disease. Electronically Signed   By: Sharlet Salina M.D.   On: 08/22/2023 15:44   CT HEAD WO CONTRAST ( ) Result Date: 08/14/2023 CLINICAL DATA:  Headache, increasing frequency or severity EXAM: CT HEAD WITHOUT CONTRAST TECHNIQUE: Contiguous axial images were obtained from the base of the skull through the vertex without intravenous contrast. RADIATION DOSE REDUCTION: This exam was  performed according to the departmental dose-optimization program which includes automated exposure control, adjustment of the mA and/or kV according to patient size and/or use of iterative reconstruction technique. COMPARISON:  None Available. FINDINGS: Brain: No hemorrhage. No hydrocephalus. No extra-axial fluid collection. No CT evidence of an acute cortical infarct. No mass effect. No mass lesion. Scattered subcortical white matter hypodensities could represent sequela age advanced chronic microvascular ischemic change, but could be further assessed with a brain MRI. Vascular: No hyperdense vessel or unexpected calcification. Skull: Normal. Negative for fracture or focal lesion. Sinuses/Orbits: No middle ear or mastoid effusion. Paranasal sinuses are clear. Orbits are unremarkable. Other: None. IMPRESSION: No CT etiology for headaches identified. Scattered subcortical white matter hypodensities could represent sequela of age advanced chronic microvascular ischemic change, but could be further assessed with a brain MRI. Electronically Signed   By: Lorenza Cambridge M.D.   On: 08/14/2023 18:59   DG Chest 2 View Result Date: 08/14/2023 CLINICAL DATA:  Shortness of breath. EXAM: CHEST - 2 VIEW COMPARISON:  03/26/2023. FINDINGS: Mild pulmonary vascular congestion. No frank pulmonary edema. Bilateral lung fields are otherwise clear. No acute consolidation or lung collapse. Bilateral costophrenic angles are clear. Stable cardio-mediastinal silhouette. No acute osseous abnormalities. The soft tissues are within normal limits. IMPRESSION: *Mild pulmonary vascular congestion.  No frank pulmonary edema. Electronically Signed   By: Jules Schick M.D.   On: 08/14/2023 13:43      Subjective: Seen and examined on the day of discharge.  Stable no distress.  Appropriate for discharge home.  Discharge Exam: Vitals:   08/24/23 0413 08/24/23 0801  BP: (!) 107/58 (!) 142/95  Pulse: 70 74  Resp: 20 18  Temp: 97.8 F  (36.6 C) 98.7 F (37.1 C)  SpO2: 94% 96%   Vitals:   08/23/23 1731 08/23/23 2022 08/24/23 0413 08/24/23 0801  BP: 131/82 (!) 149/87 (!) 107/58 (!) 142/95  Pulse: 80 65 70 74  Resp: 16 20 20 18   Temp: (!) 97.5 F (36.4 C) 97.6 F (36.4 C) 97.8 F (36.6 C) 98.7 F (37.1 C)  TempSrc: Oral Oral    SpO2: 99% 97% 94% 96%  Weight:        General: Pt is alert, awake, not in acute distress Cardiovascular: RRR, S1/S2 +, no rubs, no gallops Respiratory: CTA bilaterally, no wheezing, no rhonchi Abdominal: Soft, NT, ND, bowel sounds + Extremities: no edema, no cyanosis    The results of significant diagnostics from this hospitalization (including imaging, microbiology, ancillary and laboratory) are listed below for reference.     Microbiology: Recent Results (from the past 240 hours)  Gastrointestinal Panel by PCR , Stool     Status: None   Collection Time: 08/22/23  8:49 AM  Specimen: Stool  Result Value Ref Range Status   Campylobacter species NOT DETECTED NOT DETECTED Final   Plesimonas shigelloides NOT DETECTED NOT DETECTED Final   Salmonella species NOT DETECTED NOT DETECTED Final   Yersinia enterocolitica NOT DETECTED NOT DETECTED Final   Vibrio species NOT DETECTED NOT DETECTED Final   Vibrio cholerae NOT DETECTED NOT DETECTED Final   Enteroaggregative E coli (EAEC) NOT DETECTED NOT DETECTED Final   Enteropathogenic E coli (EPEC) NOT DETECTED NOT DETECTED Final   Enterotoxigenic E coli (ETEC) NOT DETECTED NOT DETECTED Final   Shiga like toxin producing E coli (STEC) NOT DETECTED NOT DETECTED Final   Shigella/Enteroinvasive E coli (EIEC) NOT DETECTED NOT DETECTED Final   Cryptosporidium NOT DETECTED NOT DETECTED Final   Cyclospora cayetanensis NOT DETECTED NOT DETECTED Final   Entamoeba histolytica NOT DETECTED NOT DETECTED Final   Giardia lamblia NOT DETECTED NOT DETECTED Final   Adenovirus F40/41 NOT DETECTED NOT DETECTED Final   Astrovirus NOT DETECTED NOT DETECTED  Final   Norovirus GI/GII NOT DETECTED NOT DETECTED Final   Rotavirus A NOT DETECTED NOT DETECTED Final   Sapovirus (I, II, IV, and V) NOT DETECTED NOT DETECTED Final    Comment: Performed at [facility], [address] [place], [place] [address]     Labs: BNP (last 3 results) Recent Labs    04/04/23 1124 08/14/23 1247 08/22/23 1248  BNP 29.4 376.8* 9.2   Basic Metabolic Panel: Recent Labs  Lab 08/22/23 1248 08/23/23 0628 08/24/23 0850  NA 130* 136 138  K 3.1* 3.0* 3.6  CL 90* 93* 99  CO2 26 30 29   GLUCOSE 327* 192* 214*  BUN 61* 59* 41*  CREATININE 2.47* 2.41* 1.59*  CALCIUM 9.4 8.8* 9.0  MG 2.8*  --   --   PHOS 2.8  --   --    Liver Function Tests: Recent Labs  Lab 08/22/23 1248  AST 31  ALT 34  ALKPHOS 85  BILITOT 1.3*  PROT 7.9  ALBUMIN 3.9   No results for input(s): "LIPASE", "AMYLASE" in the last 168 hours. No results for input(s): "AMMONIA" in the last 168 hours. CBC: Recent Labs  Lab 08/22/23 1248 08/23/23 0628  WBC 7.0 6.4  NEUTROABS 3.5  --   HGB 15.8* 14.9  HCT 46.7* 44.8  MCV 89.0 90.0  PLT 341 291   Cardiac Enzymes: No results for input(s): "CKTOTAL", "CKMB", "CKMBINDEX", "TROPONINI" in the last 168 hours. BNP: Invalid input(s): "POCBNP" CBG: Recent Labs  Lab 08/23/23 0816 08/23/23 1123 08/23/23 1715 08/24/23 0803 08/24/23 1215  GLUCAP 214* 224* 188* 139* 165*   D-Dimer No results for input(s): "DDIMER" in the last 72 hours. Hgb A1c Recent Labs    08/22/23 1816  HGBA1C 8.5*   Lipid Profile No results for input(s): "CHOL", "HDL", "LDLCALC", "TRIG", "CHOLHDL", "LDLDIRECT" in the last 72 hours. Thyroid function studies No results for input(s): "TSH", "T4TOTAL", "T3FREE", "THYROIDAB" in the last 72 hours.  Invalid input(s): "FREET3" Anemia work up No results for input(s): "VITAMINB12", "FOLATE", "FERRITIN", "TIBC", "IRON", "RETICCTPCT" in the last 72 hours. Urinalysis    Component Value Date/Time    COLORURINE STRAW (A) 03/26/2023 2330   APPEARANCEUR CLEAR (A) 03/26/2023 2330   APPEARANCEUR Clear 07/14/2013 1702   LABSPEC 1.015 03/26/2023 2330   LABSPEC 1.011 07/14/2013 1702   PHURINE 5.0 03/26/2023 2330   GLUCOSEU >=500 (A) 03/26/2023 2330   GLUCOSEU Negative 07/14/2013 1702   HGBUR NEGATIVE 03/26/2023 2330   BILIRUBINUR NEGATIVE 03/26/2023  2330   BILIRUBINUR Negative 07/14/2013 1702   KETONESUR NEGATIVE 03/26/2023 2330   PROTEINUR NEGATIVE 03/26/2023 2330   UROBILINOGEN 1.0 07/30/2011 2005   NITRITE NEGATIVE 03/26/2023 2330   LEUKOCYTESUR NEGATIVE 03/26/2023 2330   LEUKOCYTESUR Negative 07/14/2013 1702   Sepsis Labs Recent Labs  Lab 08/22/23 1248 08/23/23 0628  WBC 7.0 6.4   Microbiology Recent Results (from the past 240 hours)  Gastrointestinal Panel by PCR , Stool     Status: None   Collection Time: 08/22/23  8:49 AM   Specimen: Stool  Result Value Ref Range Status   Campylobacter species NOT DETECTED NOT DETECTED Final   Plesimonas shigelloides NOT DETECTED NOT DETECTED Final   Salmonella species NOT DETECTED NOT DETECTED Final   Yersinia enterocolitica NOT DETECTED NOT DETECTED Final   Vibrio species NOT DETECTED NOT DETECTED Final   Vibrio cholerae NOT DETECTED NOT DETECTED Final   Enteroaggregative E coli (EAEC) NOT DETECTED NOT DETECTED Final   Enteropathogenic E coli (EPEC) NOT DETECTED NOT DETECTED Final   Enterotoxigenic E coli (ETEC) NOT DETECTED NOT DETECTED Final   Shiga like toxin producing E coli (STEC) NOT DETECTED NOT DETECTED Final   Shigella/Enteroinvasive E coli (EIEC) NOT DETECTED NOT DETECTED Final   Cryptosporidium NOT DETECTED NOT DETECTED Final   Cyclospora cayetanensis NOT DETECTED NOT DETECTED Final   Entamoeba histolytica NOT DETECTED NOT DETECTED Final   Giardia lamblia NOT DETECTED NOT DETECTED Final   Adenovirus F40/41 NOT DETECTED NOT DETECTED Final   Astrovirus NOT DETECTED NOT DETECTED Final   Norovirus GI/GII NOT DETECTED NOT  DETECTED Final   Rotavirus A NOT DETECTED NOT DETECTED Final   Sapovirus (I, II, IV, and V) NOT DETECTED NOT DETECTED Final    Comment: Performed at [facility], [address] [place], [place] [address]     Time coordinating discharge: Over 30 minutes  SIGNED:   Tresa Moore, MD  [facility] 08/24/2023, 2:06 PM Pager   If 7PM-7AM, please contact night-coverage

## 2023-08-24 NOTE — TOC CM/SW Note (Signed)
Transition of Care [facility]) - Inpatient Brief Assessment   Patient Details  Name: [name] MRN: [id number] Date of Birth: 1976/02/04  Transition of Care [facility]) CM/SW Contact:    Chapman Fitch, RN Phone Number: 08/24/2023, 1:08 PM   Clinical Narrative:   Transition of Care [facility]) Screening Note   Patient Details  Name: [name] Date of Birth: 11-19-1975   Transition of Care [facility]) CM/SW Contact:    Chapman Fitch, RN Phone Number: 08/24/2023, 1:08 PM    Transition of Care Department [facility]) has reviewed patient and no TOC needs have been identified at this time. . If new patient transition needs arise, please place a TOC consult.   Per MD not toc needs for discharge    Transition of Care Asessment: Insurance and Status: Insurance coverage has been reviewed Patient has primary care physician: Yes     Prior/Current Home Services: No current home services Social Drivers of Health Review: SDOH reviewed no interventions necessary Readmission risk has been reviewed: Yes Transition of care needs: no transition of care needs at this time

## 2023-08-24 NOTE — Plan of Care (Signed)

## 2023-08-24 NOTE — Progress Notes (Unsigned)
ADVANCED HF CLINIC NOTE Primary Care: Center, [facility] (last seen 01/25) Primary Cardiologist: Julien Nordmann, MD (last seen 07/24)   HPI:  Heather Norman is a 48 y/o female with a history of arthritis, DM, hyperlipidemia, HTN, morbid obesity and chronic diastolic heart failure.   Zio 10/21 12.5% PVCs Saw EP Lalla Brothers) and suggested possible AAD (flecainide or propafenone) if developed symptoms or LV dysfunction.   Echo 02/10/21: EF 50-55% G1DD  Echo 01/26/22 EF 55-60% mild LVH and trivial MR.  Sleep study 07/24/22: AHI 35.4 Sats down to 75%.  Possible AF during study lasting 4.5 hours. Zio placed    Zio patch 1/24  - Sinus rhythm. No AF - 44 runs NSVT (longest 8 beats)  - 21.3% PVCs (3 morphologies 9.7%, 7.3%, 4.3%)  Saw Dr. Lalla Brothers in 3/24. felt not to be ablation candidate due to obesity. Recommended continue carvedilol as EF stable  Started on CPAP in 2024. Wore CPAP for awhile but then got bronchitis and stopped.   Seen in ER 10/30/22 for CP. Hstrop, ECG and d-Dimer all normal. BNP elevated 47 -> 480 so torsemide increased.  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   Came to ED 08/14/23 for acute onset SOB. CXR + edema.  and CT scan ok.  BP 150/117 BNP 139 -> 377. Given IV lasix  Drinks a lot of water as she says her mouth stays dry. SBP typically 120-130/70-80s  No edema, orthopnea or PND. Has not been wearing CPAP since she had bronchitis at the end of last year. Taking torsemide 80 daily and metolazone 1x/week (was 2x/week but decreased by Dr. Mariah Milling). Was given extra diuretic  Was seen in the HF clinic 08/16/23 still fluid up with elevated ReDs reading. Metolazone was increased back to 5mg  twice weekly. Also asked to limit fluid intake. To get echo updated.   Admitted 08/22/23 due to AKI found on lab results at PCP office along with hypotension. Blood pressure was 89/64 onsite visit. Continues to  drink too much fluids (120 oz water) daily because her mouth is so dry. IVF given and diuretics/ BP meds held. Potassium replaced.    She presents today for a HF follow-up visit with a chief complaint of   Past Medical History:  Diagnosis Date   (HFpEF) heart failure with preserved ejection fraction (HCC)    a. 04/2017 Echo: EF 55-60%, no rwma, Mild MR, mildly dil LA. Nl RV fxn; b. 06/2019 Echo: EF 60-65%, mod LVH. Gr2 DD. Mildly dil LA.   Arthritis    kness/hands - no meds   CHF (congestive heart failure) (HCC)    Diabetes mellitus without complication (HCC)    DM (diabetes mellitus) (HCC) 05/11/2017   Dyspnea on exertion    Headache(784.0)    otc med prn   Heart murmur    Hyperlipidemia    no meds since pregnancy   Hypertension    Morbid obesity (HCC)     No current facility-administered medications for this visit.   Current Outpatient Medications  Medication Sig Dispense Refill   metolazone (ZAROXOLYN) 2.5 MG tablet Take 1 tablet (2.5 mg total) by mouth once a week.     Facility-Administered Medications Ordered in Other Visits  Medication Dose Route Frequency Provider Last Rate Last Admin   acetaminophen (TYLENOL) tablet 650 mg  650 mg Oral Q6H PRN Emeline General, MD   650 mg at  08/23/23 2012   Or   acetaminophen (TYLENOL) suppository 650 mg  650 mg Rectal Q6H PRN Mikey College T, MD       albuterol (PROVENTIL) (2.5 MG/3ML) 0.083% nebulizer solution 3 mL  3 mL Inhalation Q4H PRN Lolita Patella B, MD       aspirin EC tablet 81 mg  81 mg Oral Daily Mikey College T, MD   81 mg at 08/24/23 1020   atorvastatin (LIPITOR) tablet 20 mg  20 mg Oral Daily Mikey College T, MD   20 mg at 08/24/23 1020   fluticasone (FLONASE) 50 MCG/ACT nasal spray 2 spray  2 spray Each Nare Daily Lolita Patella B, MD   2 spray at 08/23/23 1345   heparin injection 5,000 Units  5,000 Units Subcutaneous Q8H Mikey College T, MD   5,000 Units at 08/24/23 0602   hydrALAZINE (APRESOLINE) injection 5 mg  5 mg  Intravenous Q6H PRN Mikey College T, MD       insulin aspart (novoLOG) injection 0-9 Units  0-9 Units Subcutaneous TID WC Emeline General, MD   2 Units at 08/24/23 1252   latanoprost (XALATAN) 0.005 % ophthalmic solution 1 drop  1 drop Both Eyes QHS Mikey College T, MD   1 drop at 08/23/23 2139   linagliptin (TRADJENTA) tablet 5 mg  5 mg Oral Daily Mikey College T, MD   5 mg at 08/24/23 1021   loperamide (IMODIUM) capsule 2 mg  2 mg Oral PRN Emeline General, MD       loratadine (CLARITIN) tablet 10 mg  10 mg Oral Daily Mikey College T, MD   10 mg at 08/24/23 1020   mexiletine (MEXITIL) capsule 300 mg  300 mg Oral BID Mikey College T, MD   300 mg at 08/24/23 1021   ondansetron (ZOFRAN) tablet 4 mg  4 mg Oral Q6H PRN Emeline General, MD       Or   ondansetron [facility]) injection 4 mg  4 mg Intravenous Q6H PRN Emeline General, MD       oxybutynin (DITROPAN) tablet 5 mg  5 mg Oral BID Emeline General, MD       pantoprazole (PROTONIX) EC tablet 40 mg  40 mg Oral Daily Mikey College T, MD   40 mg at 08/24/23 1020    Allergies  Allergen Reactions   Penicillins Anaphylaxis    Has patient had a PCN reaction causing immediate rash, facial/tongue/throat swelling, SOB or lightheadedness with hypotension: Yes Has patient had a PCN reaction causing severe rash involving mucus membranes or skin necrosis: No Has patient had a PCN reaction that required hospitalization: No Has patient had a PCN reaction occurring within the last 10 years: No If all of the above answers are "NO", then may proceed with Cephalosporin use.      Social History   Socioeconomic History   Marital status: Single    Spouse name: Not on file   Number of children: 3   Years of education: 12   Highest education level: 12th grade  Occupational History   Not on file  Tobacco Use   Smoking status: Never   Smokeless tobacco: Never  Vaping Use   Vaping status: Never Used  Substance and Sexual Activity   Alcohol use: No   Drug use: No   Sexual  activity: Yes    Birth control/protection: Surgical  Other Topics Concern   Not on file  Social History Narrative   Not on file  Social Drivers of Health   Financial Resource Strain: Medium Risk (08/13/2017)   Overall Financial Resource Strain (CARDIA)    Difficulty of Paying Living Expenses: Somewhat hard  Food Insecurity: Food Insecurity Present (08/22/2023)   Hunger Vital Sign    Worried About Running Out of Food in the Last Year: Often true    Ran Out of Food in the Last Year: Sometimes true  Transportation Needs: No Transportation Needs (08/22/2023)   PRAPARE - Administrator, Civil Service (Medical): No    Lack of Transportation (Non-Medical): No  Physical Activity: Sufficiently Active (06/09/2019)   Exercise Vital Sign    Days of Exercise per Week: 5 days    Minutes of Exercise per Session: 150+ min  Stress: Stress Concern Present (08/13/2017)   [employer] of Occupational Health - Occupational Stress Questionnaire    Feeling of Stress : Rather much  Social Connections: Moderately Isolated (08/13/2017)   Social Connection and Isolation Panel [NHANES]    Frequency of Communication with Friends and Family: More than three times a week    Frequency of Social Gatherings with Friends and Family: Never    Attends Religious Services: Never    Database administrator or Organizations: No    Attends Banker Meetings: Never    Marital Status: Never married  Intimate Partner Violence: Not At Risk (08/22/2023)   Humiliation, Afraid, Rape, and Kick questionnaire    Fear of Current or Ex-Partner: No    Emotionally Abused: No    Physically Abused: No    Sexually Abused: No      Family History  Problem Relation Age of Onset   Diabetes Mother    Hypertension Mother    Hyperlipidemia Mother    Hypertension Father    Breast cancer Maternal Grandmother 52       PHYSICAL EXAM:  General:  Well appearing. No resp difficulty HEENT: normal Neck:  supple. no JVD. Carotids 2+ bilat; no bruits. No lymphadenopathy or thryomegaly appreciated. Cor: PMI nondisplaced. Regular rate & rhythm. No rubs, gallops or murmurs. Lungs: clear Abdomen: obese  soft, nontender, nondistended. No hepatosplenomegaly. No bruits or masses. Good bowel sounds. Extremities: no cyanosis, clubbing, rash, edema Neuro: alert & orientedx3, cranial nerves grossly intact. moves all 4 extremities w/o difficulty. Affect pleasant   ASSESSMENT & PLAN:   1. Acute on chronic heart failure with preserved ejection fraction - Suspect hypertensive heart disease driven by obesity and severe OSA - NYHA III - Volume status remains elevated  - weight 293.2 pounds from last visit 10 days ago - ReDs        (previous reading 49%)  - Echo 6/23 EF 55-60% G1DD - to have echo updated 08/30/23 - Continue torsemide 80 daily. Increase metolazone back to 5mg  2x/week + KCL 60  - Continue Jardiance 10  - Continue spiro 25 daily - Continue Entresto 97/103 bid - Continue carvedilol 25 bid  - continue metolazone  - reviewed 60-64 oz of fluid daily - We also discussed possible Cardiomems but size may prohibit.  - BNP 08/22/23 was 9.2   2. HTN - BP - saw PCP 01/25 - saw nephrology (Kolluru) 10/24 - BMET 08/24/23 showed sodium 138, potassium 3.6, creatinine 1.59 & GFR 40 - BMET today   3. PVC's, frequent - Zio 10/21 12.5% PVCs Saw EP Lalla Brothers) and suggested possible AAD (flecainide or propafenone) if developed symptoms or LV dysfunction.  - Zio patch 1/24. Sinus No AF. 44 runs NSVT (longest  8 beats). 21.3% PVCs (3 morphologies 9.7%, 7.3%, 4.3%) - suspect driven by OSA but could aslo be infiltrative process (? Sarcoid) -> Check cMRI - Saw Dr. Lalla Brothers 03/24 felt not to be ablation candidate due to obesity. Recommended continue carvedilol as EF stable - PVCs not improve with CPAP - continue mexilitene 300 bid - Still awaiting cMRI (need insurance approval)   4. Possible AF  - noted on  sleep study and AppleWatch - No AF on Zio -> suspect this is likely PVCs - saw cardiology Mariah Milling) 07/24  5. OSA, severe - Sleep study 07/24/22: AHI 35.4 Sats down to 75%. Has not started CPAP yet. - discussed role of weight loss - Stopped wearing CPAP. Stressed need to resume. Refer to Pulmonary to establish CPAP car  6. Morbid obesity - On ozempic - Continue activity.   Delma Freeze, FNP  1:09 PM

## 2023-08-24 NOTE — Progress Notes (Signed)
Pt concerned her diuretic medications may be contributing to her kidney function failing and wanted to see if she could reconcile her medications to minimize the amount of diuretics she receives or possibly change her meds. Pt also has concerns about dry mouth and wants to address this as well. Also educated pt on diet as pt states she drinks a gallon of water and consume high salt diet, but feels swollen. Will print out educational information related to pt condition.

## 2023-08-27 ENCOUNTER — Telehealth: Payer: Self-pay

## 2023-08-27 ENCOUNTER — Ambulatory Visit (HOSPITAL_BASED_OUTPATIENT_CLINIC_OR_DEPARTMENT_OTHER): Payer: Managed Care, Other (non HMO) | Admitting: Family

## 2023-08-27 ENCOUNTER — Other Ambulatory Visit: Payer: Self-pay

## 2023-08-27 ENCOUNTER — Encounter: Payer: Self-pay | Admitting: Family

## 2023-08-27 ENCOUNTER — Other Ambulatory Visit
Admission: RE | Admit: 2023-08-27 | Discharge: 2023-08-27 | Disposition: A | Discharge status: Home / Self Care | Payer: Managed Care, Other (non HMO) | Source: Ambulatory Visit | Attending: Family | Admitting: Family

## 2023-08-27 VITALS — BP 119/76 | HR 79 | Wt 298.0 lb

## 2023-08-27 DIAGNOSIS — I4891 Unspecified atrial fibrillation: Secondary | ICD-10-CM

## 2023-08-27 DIAGNOSIS — I10 Essential (primary) hypertension: Secondary | ICD-10-CM | POA: Diagnosis not present

## 2023-08-27 DIAGNOSIS — I5032 Chronic diastolic (congestive) heart failure: Secondary | ICD-10-CM

## 2023-08-27 DIAGNOSIS — I493 Ventricular premature depolarization: Secondary | ICD-10-CM

## 2023-08-27 DIAGNOSIS — G4733 Obstructive sleep apnea (adult) (pediatric): Secondary | ICD-10-CM

## 2023-08-27 LAB — BASIC METABOLIC PANEL
Anion gap: 11 (ref 5–15)
BUN: 21 mg/dL — ABNORMAL HIGH (ref 6–20)
CO2: 24 mmol/L (ref 22–32)
Calcium: 9.7 mg/dL (ref 8.9–10.3)
Chloride: 105 mmol/L (ref 98–111)
Creatinine, Ser: 1.18 mg/dL — ABNORMAL HIGH (ref 0.44–1.00)
GFR, Estimated: 57 mL/min — ABNORMAL LOW (ref >60)
Glucose, Bld: 181 mg/dL — ABNORMAL HIGH (ref 70–99)
Potassium: 3.6 mmol/L (ref 3.5–5.1)
Sodium: 140 mmol/L (ref 135–145)

## 2023-08-27 LAB — BRAIN NATRIURETIC PEPTIDE: B Natriuretic Peptide: 209.8 pg/mL — ABNORMAL HIGH (ref 0.0–100.0)

## 2023-08-27 NOTE — Progress Notes (Signed)
REDS VEST READING= 45% CHEST RULER=35 HEIGHT MARKER=B

## 2023-08-27 NOTE — Telephone Encounter (Signed)
-----   Message from Delma Freeze sent at 08/27/2023  4:24 PM EST ----- Potassium is normal (although on the low end of normal) and kidney function improving. BNP level is elevated  which means you're retaining fluid. Increase torsemide to 60mg  AM/ 40mg  PM and increase potassium to 60ml daily with additional 60ml when take metolazone.   BMET and pro-BNP in 1 week

## 2023-08-27 NOTE — Addendum Note (Signed)
Addended by: Jola Schmidt A on: 08/27/2023 04:35 PM   Modules accepted: Orders

## 2023-08-27 NOTE — Progress Notes (Signed)
Celso Amy, PA-C [address]  [address]  [place], [place] [address]  Main: [phone]  Fax: [phone]   Primary Care Physician: Center, [facility]  Primary Gastroenterologist:  Celso Amy, PA-C   CC: F/U GERD; Colon Cancer Screening  HPI: Heather Norman is a 48 y.o. female returns for 3 month follow-up of chronic GERD.   She is taking pantoprazole 40 Mg daily and famotidine 20 Mg twice daily for acid reflux.  Zofran as needed for nausea.  Takes Mylanta and Tums daily for breakthrough heartburn symptoms.  She is trying to avoid GERD trigger foods.  On this treatment her acid reflux has greatly improved.  She has no more heartburn or reflux of acid into her chest.  She denies dysphagia.  Belching is a lot better.  No more chest pain.  05/31/23 UGIS: Mild volume GERD with mild esophageal dysmotility.  Otherwise normal.  No stricture or masses.   She was scheduled for EGD 04/03/23 with Dr. Allegra Lai for intolerable acid reflux, however she canceled EGD due to panic attacks and fear of being sedated.   Medical history significant for CHF, diabetes, CKD, morbid obesity with BMI 51.4.  She was recently hospitalized 08/22/2023 until 08/24/2023 for acute kidney injury, overdiuresis, hypotension.  Fluids were restricted to no more than 1.5 L of fluids daily.  Diuretic was changed to torsemide once per week.  She was instructed to follow-up with cardiology, nephrology after discharge.  She had f/u with Cardiology yesterday 1/27.  She continues to have chronic cough and shortness of breath which improves after using inhaler.  She has follow-up with pulmonologist and nephrologist next week.    CREATININE 1.59 (H) 08/24/2023    CREATININE 2.41 (H) 08/23/2023    CREATININE 2.47 (H) 08/22/2023   08/23/23: Hgb 14.9g.   Previous GI: She saw Dr. Marva Panda at [facility] clinic 09/2017 to evaluate loose stools and GERD.  She has family history of colon polyps in her mother.   Colon cancer in maternal grandmother.  Patient was scheduled for a colonoscopy in 2019, however she did not complete the procedure.  She reports having previous EGD many years ago, no results.    Patient states she completed a Cologuard test through her PCP in 2024 which was negative.  She denies any rectal bleeding, unintentional weight loss, constipation, diarrhea, or other alarm symptoms.  Current Outpatient Medications  Medication Sig Dispense Refill   albuterol (VENTOLIN HFA) 108 (90 Base) MCG/ACT inhaler Inhale 1-2 puffs into the lungs every 6 (six) hours as needed. 18 g 0   aluminum-magnesium hydroxide 200-200 MG/5ML suspension Take 15 mLs by mouth every 6 (six) hours as needed for indigestion.     aspirin EC 81 MG tablet Take 81 mg by mouth daily.     atorvastatin (LIPITOR) 20 MG tablet TAKE 1 TABLET(20 MG) BY MOUTH DAILY 90 tablet 3   carvedilol (COREG) 25 MG tablet TAKE 1 TABLET BY MOUTH IN THE MORNING AND 1 AND 1/2 TABLETS IN THE EVENING 225 tablet 3   cetirizine (ZYRTEC) 10 MG tablet Take 10 mg by mouth daily.     empagliflozin (JARDIANCE) 25 MG TABS tablet Take 1 tablet (25 mg total) by mouth daily before breakfast. 30 tablet 6   famotidine (PEPCID) 20 MG tablet Take 1 tablet (20 mg total) by mouth 2 (two) times daily.     famotidine-calcium carbonate-magnesium hydroxide (PEPCID COMPLETE) 10-800-165 MG chewable tablet Chew 1 tablet by mouth daily as  needed.     fluticasone (FLONASE) 50 MCG/ACT nasal spray Place 2 sprays into both nostrils daily.     latanoprost (XALATAN) 0.005 % ophthalmic solution Place 1 drop into both eyes at bedtime.     loratadine (CLARITIN) 10 MG tablet Take 10 mg by mouth daily. Takes daily     meclizine (ANTIVERT) 25 MG tablet Take 1 tablet (25 mg total) by mouth 3 (three) times daily as needed for dizziness or nausea. 30 tablet 1   metolazone (ZAROXOLYN) 2.5 MG tablet Take 1 tablet (2.5 mg total) by mouth once a week.     mexiletine (MEXITIL) 150 MG capsule  Take 2 capsules (300 mg total) by mouth 2 (two) times daily. 360 capsule 3   oxybutynin (DITROPAN) 5 MG tablet Take 5 mg by mouth 2 (two) times daily.     pantoprazole (PROTONIX) 40 MG tablet Take 1 tablet (40 mg total) by mouth daily. 90 tablet 3   potassium chloride 20 MEQ/15ML (10%) SOLN Take 45 mLs (60 mEq total) by mouth daily. And additional on the days you take the metolazone     sacubitril-valsartan (ENTRESTO) 97-103 MG Take 1 tablet by mouth 2 (two) times daily. 90 tablet 3   Semaglutide,0.25 or 0.5MG /DOS, 2 MG/3ML SOPN Inject 0.25 mg into the skin once a week. 3 mL 5   spironolactone (ALDACTONE) 25 MG tablet TAKE 1 TABLET(25 MG) BY MOUTH DAILY 90 tablet 3   torsemide (DEMADEX) 20 MG tablet Take 4 tablets (80 mg total) by mouth daily. 180 tablet 6   No current facility-administered medications for this visit.    Allergies as of 08/28/2023 - Review Complete 08/28/2023  Allergen Reaction Noted   Penicillins Anaphylaxis 05/18/2011    Past Medical History:  Diagnosis Date   (HFpEF) heart failure with preserved ejection fraction (HCC)    a. 04/2017 Echo: EF 55-60%, no rwma, Mild MR, mildly dil LA. Nl RV fxn; b. 06/2019 Echo: EF 60-65%, mod LVH. Gr2 DD. Mildly dil LA.   Arthritis    kness/hands - no meds   CHF (congestive heart failure) (HCC)    Diabetes mellitus without complication (HCC)    DM (diabetes mellitus) (HCC) 05/11/2017   Dyspnea on exertion    Headache(784.0)    otc med prn   Heart murmur    Hyperlipidemia    no meds since pregnancy   Hypertension    Morbid obesity [facility])     Past Surgical History:  Procedure Laterality Date   CERVICAL CERCLAGE  06/01/2011   Procedure: CERCLAGE CERVICAL;  Surgeon: Philip Aspen, DO;  Location: [facility];  Service: Gynecology;  Laterality: N/A;  REQUESTING PORTABLE ULTARSOUND AT BEDSIDE PT'S EDC:12/02/2011   CERVICAL CERCLAGE  06/01/2011   Procedure: CERCLAGE CERVICAL;  Surgeon: Philip Aspen, DO;  Location: [facility];   Service: Gynecology;  Laterality: N/A;  REQUESTING PORTABLE ULTARSOUND AT BEDSIDE PT'S EDC:12/02/2011   endosocpy     RIGHT/LEFT HEART CATH AND CORONARY ANGIOGRAPHY N/A 03/28/2023   Procedure: RIGHT/LEFT HEART CATH AND CORONARY ANGIOGRAPHY;  Surgeon: Dolores Patty, MD;  Location: [facility] INVASIVE CV LAB;  Service: Cardiovascular;  Laterality: N/A;   svd     x 2   tab     x 1   TUBAL LIGATION      Review of Systems:    All systems reviewed and negative except where noted in HPI.   Physical Examination:   BP (!) 86/62   Pulse 72   Temp 97.9 F (36.6  C)   Ht 5\' 5"  (1.651 m)   Wt 298 lb (135.2 kg)   LMP  (LMP Unknown)   BMI 49.59 kg/m   General: Well-nourished, well-developed, obese, in no acute distress.  Neuro: Alert and oriented x 3.  Grossly intact.  Psych: Alert and cooperative, normal mood and affect.  Imaging Studies: DG Chest 2 View Result Date: 08/22/2023 CLINICAL DATA:  Abnormal blood work, history of CHF, acute renal insufficiency EXAM: CHEST - 2 VIEW COMPARISON:  08/14/2023 FINDINGS: Frontal and lateral views of the chest demonstrate a stable cardiac silhouette. Pulmonary vascular congestion is unchanged. No airspace disease, effusion, or pneumothorax. No acute bony abnormalities. IMPRESSION: 1. Stable chronic pulmonary vascular congestion. No acute airspace disease. Electronically Signed   By: Sharlet Salina M.D.   On: 08/22/2023 15:44   CT HEAD WO CONTRAST ( ) Result Date: 08/14/2023 CLINICAL DATA:  Headache, increasing frequency or severity EXAM: CT HEAD WITHOUT CONTRAST TECHNIQUE: Contiguous axial images were obtained from the base of the skull through the vertex without intravenous contrast. RADIATION DOSE REDUCTION: This exam was performed according to the departmental dose-optimization program which includes automated exposure control, adjustment of the mA and/or kV according to patient size and/or use of iterative reconstruction technique. COMPARISON:  None  Available. FINDINGS: Brain: No hemorrhage. No hydrocephalus. No extra-axial fluid collection. No CT evidence of an acute cortical infarct. No mass effect. No mass lesion. Scattered subcortical white matter hypodensities could represent sequela age advanced chronic microvascular ischemic change, but could be further assessed with a brain MRI. Vascular: No hyperdense vessel or unexpected calcification. Skull: Normal. Negative for fracture or focal lesion. Sinuses/Orbits: No middle ear or mastoid effusion. Paranasal sinuses are clear. Orbits are unremarkable. Other: None. IMPRESSION: No CT etiology for headaches identified. Scattered subcortical white matter hypodensities could represent sequela of age advanced chronic microvascular ischemic change, but could be further assessed with a brain MRI. Electronically Signed   By: Lorenza Cambridge M.D.   On: 08/14/2023 18:59   DG Chest 2 View Result Date: 08/14/2023 CLINICAL DATA:  Shortness of breath. EXAM: CHEST - 2 VIEW COMPARISON:  03/26/2023. FINDINGS: Mild pulmonary vascular congestion. No frank pulmonary edema. Bilateral lung fields are otherwise clear. No acute consolidation or lung collapse. Bilateral costophrenic angles are clear. Stable cardio-mediastinal silhouette. No acute osseous abnormalities. The soft tissues are within normal limits. IMPRESSION: *Mild pulmonary vascular congestion.  No frank pulmonary edema. Electronically Signed   By: Jules Schick M.D.   On: 08/14/2023 13:43    Assessment and Plan:   GWENDLYON ZUMBRO is a 48 y.o. y/o female returns for f/u of:  GERD-recent upper GI series showed mild GERD with no other abnormality.  GERD symptoms have greatly improved on pantoprazole 40 Mg once daily and Pepcid 20 Mg twice daily. Continue GERD treatment Continue GERD diet -handout given. Weight loss is advised. EGD is not indicated at this time.  Colon Cancer Screening  Negative Cologuard through PCP in 2024.  Repeat Cologuard every 3 years  for screening.  Patient declined to schedule colonoscopy at this time.  3.   Multiple comorbidities: Including morbid obesity, diabetes, diastolic CHF, acute and chronic kidney disease.  Recently hospitalized for worsening shortness of breath and CHF exacerbation.  She is considered high risk for sedation and endoscopy procedures.  Still having shortness of breath.  She continues to have follow-ups with cardiology, pulmonology, and nephrology.  We discussed risks and benefits of EGD and colonoscopy procedures at length.  Risk of sedation,  colon perforation, bleeding, complications from colonoscopy prep.  She would need cardiac clearance for a colonoscopy and EGD if she decides to do procedures in the future.    Celso Amy, PA-C  Follow up 3-4 months with Dr. Tobi Bastos.

## 2023-08-28 ENCOUNTER — Encounter: Payer: Self-pay | Admitting: Physician Assistant

## 2023-08-28 ENCOUNTER — Ambulatory Visit: Payer: Managed Care, Other (non HMO) | Admitting: Physician Assistant

## 2023-08-28 ENCOUNTER — Other Ambulatory Visit: Payer: Self-pay | Admitting: *Deleted

## 2023-08-28 VITALS — BP 86/62 | HR 72 | Temp 97.9°F | Ht 65.0 in | Wt 298.0 lb

## 2023-08-28 DIAGNOSIS — I5032 Chronic diastolic (congestive) heart failure: Secondary | ICD-10-CM

## 2023-08-28 DIAGNOSIS — K219 Gastro-esophageal reflux disease without esophagitis: Secondary | ICD-10-CM

## 2023-08-28 DIAGNOSIS — Z1211 Encounter for screening for malignant neoplasm of colon: Secondary | ICD-10-CM

## 2023-08-30 ENCOUNTER — Ambulatory Visit
Admission: RE | Admit: 2023-08-30 | Discharge: 2023-08-30 | Disposition: A | Discharge status: Home / Self Care | Payer: Managed Care, Other (non HMO) | Source: Ambulatory Visit | Attending: Internal Medicine | Admitting: Internal Medicine

## 2023-08-30 DIAGNOSIS — I5032 Chronic diastolic (congestive) heart failure: Secondary | ICD-10-CM | POA: Diagnosis present

## 2023-08-30 DIAGNOSIS — I110 Hypertensive heart disease with heart failure: Secondary | ICD-10-CM | POA: Insufficient documentation

## 2023-08-30 DIAGNOSIS — E119 Type 2 diabetes mellitus without complications: Secondary | ICD-10-CM | POA: Insufficient documentation

## 2023-08-30 DIAGNOSIS — R9439 Abnormal result of other cardiovascular function study: Secondary | ICD-10-CM

## 2023-08-30 LAB — ECHOCARDIOGRAM COMPLETE
AR max vel: 1.85 cm2
AV Area VTI: 2.12 cm2
AV Area mean vel: 1.65 cm2
AV Mean grad: 2.5 mm[Hg]
AV Peak grad: 3.9 mm[Hg]
Ao pk vel: 0.99 m/s
Area-P 1/2: 2.63 cm2
MV VTI: 1.69 cm2
S' Lateral: 2.3 cm

## 2023-08-30 NOTE — Progress Notes (Signed)
*  PRELIMINARY RESULTS* Echocardiogram 2D Echocardiogram has been performed.  Cristela Blue 08/30/2023, 10:34 AM

## 2023-09-03 ENCOUNTER — Encounter: Payer: Self-pay | Admitting: Nurse Practitioner

## 2023-09-03 ENCOUNTER — Other Ambulatory Visit: Payer: Self-pay | Admitting: Nurse Practitioner

## 2023-09-03 DIAGNOSIS — E042 Nontoxic multinodular goiter: Secondary | ICD-10-CM

## 2023-09-03 DIAGNOSIS — N6042 Mammary duct ectasia of left breast: Secondary | ICD-10-CM

## 2023-09-03 DIAGNOSIS — R928 Other abnormal and inconclusive findings on diagnostic imaging of breast: Secondary | ICD-10-CM

## 2023-09-07 ENCOUNTER — Ambulatory Visit
Admission: RE | Admit: 2023-09-07 | Discharge: 2023-09-07 | Disposition: A | Discharge status: Home / Self Care | Payer: Managed Care, Other (non HMO) | Source: Ambulatory Visit | Attending: Nurse Practitioner | Admitting: Nurse Practitioner

## 2023-09-07 DIAGNOSIS — E042 Nontoxic multinodular goiter: Secondary | ICD-10-CM | POA: Insufficient documentation

## 2023-09-11 ENCOUNTER — Ambulatory Visit: Payer: Managed Care, Other (non HMO) | Admitting: Internal Medicine

## 2023-09-11 ENCOUNTER — Encounter: Payer: Self-pay | Admitting: Internal Medicine

## 2023-09-11 VITALS — BP 126/88 | HR 69 | Temp 98.1°F | Ht 65.0 in | Wt 269.6 lb

## 2023-09-11 DIAGNOSIS — G4733 Obstructive sleep apnea (adult) (pediatric): Secondary | ICD-10-CM

## 2023-09-11 DIAGNOSIS — I5022 Chronic systolic (congestive) heart failure: Secondary | ICD-10-CM

## 2023-09-11 DIAGNOSIS — G4710 Hypersomnia, unspecified: Secondary | ICD-10-CM

## 2023-09-11 NOTE — Patient Instructions (Signed)
Referral to DME company [employer] RESMED [id number] MASK Please restart wearing CPAP mask  Recommend weight loss Follow up with Cardiology    Avoid Allergens and Irritants Avoid secondhand smoke Avoid SICK contacts Recommend  Masking  when appropriate Recommend Keep up-to-date with vaccinations   Be aware of reduced alertness and do not drive or operate heavy machinery if experiencing this or drowsiness.  Exercise encouraged, as tolerated. Encouraged proper weight management.  Important to get eight or more hours of sleep  Limiting the use of the computer and television before bedtime.  Decrease naps during the day, so night time sleep will become enhanced.  Limit caffeine, and sleep deprivation.

## 2023-09-11 NOTE — Progress Notes (Unsigned)
Name: Heather Norman MRN: [id number] DOB: 07/05/1976    CHIEF COMPLAINT:  EXCESSIVE DAYTIME SLEEPINESS Assessment of Sleep Apnea   HISTORY OF PRESENT ILLNESS: Patient is seen today for problems and issues with sleep related to excessive daytime sleepiness Patient  has been having sleep problems for many years Patient has been having excessive daytime sleepiness for a long time Patient has been having extreme fatigue and tiredness, lack of energy +  very Loud snoring every night + struggling breathe at night and gasps for air + morning headaches + Nonrefreshing sleep  Discussed sleep data and reviewed with patient.  Encouraged proper weight management.  Discussed driving precautions and its relationship with hypersomnolence.  Discussed operating dangerous equipment and its relationship with hypersomnolence.  Discussed sleep hygiene, and benefits of a fixed sleep waked time.  The importance of getting eight or more hours of sleep discussed with patient.  Discussed limiting the use of the computer and television before bedtime.  Decrease naps during the day, so night time sleep will become enhanced.  Limit caffeine, and sleep deprivation.  HTN, stroke, and heart failure are potential risk factors.    EPWORTH SLEEP SCORE 19  No evidence of heart failure at this time No evidence or signs of infection at this time No respiratory distress No fevers, chills, nausea, vomiting, diarrhea No evidence of lower extremity edema No evidence hemoptysis  PAST MEDICAL HISTORY :   has a past medical history of (HFpEF) heart failure with preserved ejection fraction (HCC), Arthritis, CHF (congestive heart failure) (HCC), Diabetes mellitus without complication (HCC), DM (diabetes mellitus) (HCC) (05/11/2017), Dyspnea on exertion, Headache(784.0), Heart murmur, Hyperlipidemia, Hypertension, and Morbid obesity (HCC).  has a past surgical history that includes Cervical cerclage (06/01/2011); svd;  endosocpy; tab; Cervical cerclage (06/01/2011); Tubal ligation; and RIGHT/LEFT HEART CATH AND CORONARY ANGIOGRAPHY (N/A, 03/28/2023). Prior to Admission medications   Medication Sig Start Date End Date Taking? Authorizing Provider  albuterol (VENTOLIN HFA) 108 (90 Base) MCG/ACT inhaler Inhale 1-2 puffs into the lungs every 6 (six) hours as needed. 07/20/22  Yes Mickie Bail, NP  aluminum-magnesium hydroxide 200-200 MG/5ML suspension Take 15 mLs by mouth every 6 (six) hours as needed for indigestion.   Yes [provider]  aspirin EC 81 MG tablet Take 81 mg by mouth daily.   Yes [provider]  atorvastatin (LIPITOR) 20 MG tablet TAKE 1 TABLET(20 MG) BY MOUTH DAILY 12/26/22  Yes Hackney, Inetta Fermo A, FNP  carvedilol (COREG) 25 MG tablet TAKE 1 TABLET BY MOUTH IN THE MORNING AND 1 AND 1/2 TABLETS IN THE EVENING 02/19/23  Yes Gollan, Tollie Pizza, MD  cetirizine (ZYRTEC) 10 MG tablet Take 10 mg by mouth daily. 08/21/23  Yes [provider]  empagliflozin (JARDIANCE) 25 MG TABS tablet Take 1 tablet (25 mg total) by mouth daily before breakfast. 01/25/23  Yes [name], FNP  famotidine (PEPCID) 20 MG tablet Take 1 tablet (20 mg total) by mouth 2 (two) times daily. 02/20/23  Yes Celso Amy, PA-C  famotidine-calcium carbonate-magnesium hydroxide (PEPCID COMPLETE) 10-800-165 MG chewable tablet Chew 1 tablet by mouth daily as needed.   Yes [provider]  fluticasone (FLONASE) 50 MCG/ACT nasal spray Place 2 sprays into both nostrils daily. 08/21/23  Yes [provider]  latanoprost (XALATAN) 0.005 % ophthalmic solution Place 1 drop into both eyes at bedtime. 01/17/22  Yes [provider]  meclizine (ANTIVERT) 25 MG tablet Take 1 tablet (25 mg total) by mouth 3 (three)  times daily as needed for dizziness or nausea. 08/23/22  Yes Sharman Cheek, MD  metolazone (ZAROXOLYN) 2.5 MG tablet Take 1 tablet (2.5 mg total) by mouth once a week. 08/24/23  Yes Sreenath,  Sudheer B, MD  mexiletine (MEXITIL) 150 MG capsule Take 2 capsules (300 mg total) by mouth 2 (two) times daily. 03/26/23  Yes Bensimhon, Bevelyn Buckles, MD  pantoprazole (PROTONIX) 40 MG tablet Take 1 tablet (40 mg total) by mouth daily. 02/20/23 02/15/24 Yes Celso Amy, PA-C  potassium chloride 20 MEQ/15ML (10%) SOLN Take 45 mLs (60 mEq total) by mouth daily. And additional on the days you take the metolazone 03/27/23  Yes Milford, Jessica M, FNP  sacubitril-valsartan (ENTRESTO) 97-103 MG Take 1 tablet by mouth 2 (two) times daily. 12/19/22  Yes Delma Freeze, FNP  Semaglutide,0.25 or 0.5MG /DOS, 2 MG/3ML SOPN Inject 0.25 mg into the skin once a week. 11/06/22  Yes Hackney, Inetta Fermo A, FNP  spironolactone (ALDACTONE) 25 MG tablet TAKE 1 TABLET(25 MG) BY MOUTH DAILY 04/13/22  Yes Gollan, Tollie Pizza, MD  torsemide (DEMADEX) 20 MG tablet Take 4 tablets (80 mg total) by mouth daily. 03/05/23  Yes Milford, Anderson Malta, FNP   Allergies  Allergen Reactions   Penicillins Anaphylaxis    Has patient had a PCN reaction causing immediate rash, facial/tongue/throat swelling, SOB or lightheadedness with hypotension: Yes Has patient had a PCN reaction causing severe rash involving mucus membranes or skin necrosis: No Has patient had a PCN reaction that required hospitalization: No Has patient had a PCN reaction occurring within the last 10 years: No If all of the above answers are "NO", then may proceed with Cephalosporin use.    FAMILY HISTORY:  family history includes Breast cancer (age of onset: 32) in her maternal grandmother; Diabetes in her mother; Hyperlipidemia in her mother; Hypertension in her father and mother. SOCIAL HISTORY:  reports that she has never smoked. She has never used smokeless tobacco. She reports that she does not drink alcohol and does not use drugs.   Review of Systems:  Gen:  Denies  fever, sweats, chills weight loss  HEENT: Denies blurred vision, double vision, ear pain, eye pain,  hearing loss, nose bleeds, sore throat Cardiac:  No dizziness, chest pain or heaviness, chest tightness,edema, No JVD Resp:   No cough, -sputum production, -shortness of breath,-wheezing, -hemoptysis,  Gi: Denies swallowing difficulty, stomach pain, nausea or vomiting, diarrhea, constipation, bowel incontinence Gu:  Denies bladder incontinence, burning urine Ext:   Denies Joint pain, stiffness or swelling Skin: Denies  skin rash, easy bruising or bleeding or hives Endoc:  Denies polyuria, polydipsia , polyphagia or weight change Psych:   Denies depression, insomnia or hallucinations  Other:  All other systems negative   ALL OTHER ROS ARE NEGATIVE  BP 126/88 (BP Location: Left Arm, Patient Position: Sitting, Cuff Size: Normal)   Pulse 69   Temp 98.1 F (36.7 C) (Temporal)   Ht 5\' 5"  (1.651 m)   Wt 269 lb 9.6 oz (122.3 kg)   LMP  (LMP Unknown)   SpO2 98%   BMI 44.86 kg/m     Physical Examination:   General Appearance: No distress  EYES PERRLA, EOM intact.   NECK Supple, No JVD ORAL CAVITY MALLAMPATI 4 Pulmonary: normal breath sounds, No wheezing.  CardiovascularNormal S1,S2.  No m/r/g.   Abdomen: Benign, Soft, non-tender. Skin:   warm, no rashes, no ecchymosis  Extremities: normal, no cyanosis, clubbing. Neuro:without focal findings,  speech normal  PSYCHIATRIC:  Mood, affect within normal limits.   ALL OTHER ROS ARE NEGATIVE    ASSESSMENT AND PLAN SYNOPSIS  Patient with signs and symptoms of excessive daytime sleepiness with probable underlying diagnosis of obstructive sleep apnea in the setting of obesity and deconditioned state   Recommend Sleep Study for definitve diagnosis  Obesity -recommend significant weight loss -recommend changing diet  Deconditioned state -Recommend increased daily activity and exercise   MEDICATION ADJUSTMENTS/LABS AND TESTS ORDERED: Recommend Sleep Study Recommend weight loss   CURRENT MEDICATIONS REVIEWED AT LENGTH WITH  PATIENT TODAY   Patient  satisfied with Plan of action and management. All questions answered  Follow up  3 months   I spent a total of  61 minutes reviewing chart data, face-to-face evaluation with the patient, counseling and coordination of care as detailed above.    Lucie Leather, M.D.  [facility]  Medical Director [facility] Medical Director [facility] Cardio-Pulmonary Department

## 2023-09-13 ENCOUNTER — Other Ambulatory Visit: Payer: Self-pay | Admitting: Nurse Practitioner

## 2023-09-13 DIAGNOSIS — N63 Unspecified lump in unspecified breast: Secondary | ICD-10-CM

## 2023-09-13 DIAGNOSIS — R928 Other abnormal and inconclusive findings on diagnostic imaging of breast: Secondary | ICD-10-CM

## 2023-09-30 NOTE — Progress Notes (Unsigned)
 Celso Amy, PA-C [address]  [address]  [place], [place] [address]  Main: [phone]  Fax: [phone]  Primary Care Physician: [facility]  Primary Gastroenterologist:  Celso Amy, PA-C   CC: F/U GERD  HPI: Heather Norman is a 48 y.o. female  returns for follow-up of chronic GERD.   She is taking pantoprazole 40 Mg daily and famotidine 20 Mg twice daily for acid reflux.  Zofran as needed for nausea.  Takes Mylanta and Tums daily for breakthrough heartburn symptoms.  She is trying to avoid GERD trigger foods.  On this treatment her acid reflux has greatly improved.  She has no more heartburn or reflux of acid into her chest.  She denies dysphagia.  Belching is a lot better.  No more chest pain.   05/31/23 UGIS: Mild volume GERD with mild esophageal dysmotility.  Otherwise normal.  No stricture or masses.   Medical history significant for CHF, diabetes, CKD, morbid obesity, Thyroid nodules.  08/23/23: Hgb 14.9g.  Recent GFR 34.  Followed by Cardiology, Pulmonology, Nephrology and Endocrinology.   Previous GI: She saw Dr. Marva Panda at [facility] clinic 09/2017 to evaluate loose stools and GERD.  Patient was scheduled for a colonoscopy in 2019, however she did not complete the procedure.  She is reluctant to schedule EGD / Colonoscopy due to anxiety and fear of being sedated.   Patient states she completed a Cologuard test through her PCP in 2024 which was negative.  She denies any rectal bleeding, unintentional weight loss, constipation, diarrhea, or other alarm symptoms.  Current Outpatient Medications  Medication Sig Dispense Refill   albuterol (VENTOLIN HFA) 108 (90 Base) MCG/ACT inhaler Inhale 1-2 puffs into the lungs every 6 (six) hours as needed. 18 g 0   aluminum-magnesium hydroxide 200-200 MG/5ML suspension Take 15 mLs by mouth every 6 (six) hours as needed for indigestion.     aspirin EC 81 MG tablet Take 81 mg by mouth daily.      atorvastatin (LIPITOR) 20 MG tablet TAKE 1 TABLET(20 MG) BY MOUTH DAILY 90 tablet 3   carvedilol (COREG) 25 MG tablet TAKE 1 TABLET BY MOUTH IN THE MORNING AND 1 AND 1/2 TABLETS IN THE EVENING 225 tablet 3   cetirizine (ZYRTEC) 10 MG tablet Take 10 mg by mouth daily.     empagliflozin (JARDIANCE) 25 MG TABS tablet Take 1 tablet (25 mg total) by mouth daily before breakfast. 30 tablet 6   famotidine (PEPCID) 20 MG tablet Take 1 tablet (20 mg total) by mouth 2 (two) times daily.     famotidine-calcium carbonate-magnesium hydroxide (PEPCID COMPLETE) 10-800-165 MG chewable tablet Chew 1 tablet by mouth daily as needed.     fluticasone (FLONASE) 50 MCG/ACT nasal spray Place 2 sprays into both nostrils daily.     latanoprost (XALATAN) 0.005 % ophthalmic solution Place 1 drop into both eyes at bedtime.     meclizine (ANTIVERT) 25 MG tablet Take 1 tablet (25 mg total) by mouth 3 (three) times daily as needed for dizziness or nausea. 30 tablet 1   metolazone (ZAROXOLYN) 2.5 MG tablet Take 1 tablet (2.5 mg total) by mouth once a week.     mexiletine (MEXITIL) 150 MG capsule Take 2 capsules (300 mg total) by mouth 2 (two) times daily. 360 capsule 3   pantoprazole (PROTONIX) 40 MG tablet Take 1 tablet (40 mg total) by mouth daily. 90 tablet 3   potassium chloride 20 MEQ/15ML (10%) SOLN Take  45 mLs (60 mEq total) by mouth daily. And additional on the days you take the metolazone     sacubitril-valsartan (ENTRESTO) 97-103 MG Take 1 tablet by mouth 2 (two) times daily. 90 tablet 3   Semaglutide,0.25 or 0.5MG /DOS, 2 MG/3ML SOPN Inject 0.25 mg into the skin once a week. 3 mL 5   spironolactone (ALDACTONE) 25 MG tablet TAKE 1 TABLET(25 MG) BY MOUTH DAILY 90 tablet 3   torsemide (DEMADEX) 20 MG tablet Take 4 tablets (80 mg total) by mouth daily. 180 tablet 6   No current facility-administered medications for this visit.    Allergies as of 10/01/2023 - Review Complete 10/01/2023  Allergen Reaction Noted    Penicillins Anaphylaxis 05/18/2011    Past Medical History:  Diagnosis Date   (HFpEF) heart failure with preserved ejection fraction (HCC)    a. 04/2017 Echo: EF 55-60%, no rwma, Mild MR, mildly dil LA. Nl RV fxn; b. 06/2019 Echo: EF 60-65%, mod LVH. Gr2 DD. Mildly dil LA.   Arthritis    kness/hands - no meds   CHF (congestive heart failure) (HCC)    Diabetes mellitus without complication (HCC)    DM (diabetes mellitus) (HCC) 05/11/2017   Dyspnea on exertion    Headache(784.0)    otc med prn   Heart murmur    Hyperlipidemia    no meds since pregnancy   Hypertension    Morbid obesity [facility])     Past Surgical History:  Procedure Laterality Date   CERVICAL CERCLAGE  06/01/2011   Procedure: CERCLAGE CERVICAL;  Surgeon: Philip Aspen, DO;  Location: [facility]S;  Service: Gynecology;  Laterality: N/A;  REQUESTING PORTABLE ULTARSOUND AT BEDSIDE PT'S EDC:12/02/2011   CERVICAL CERCLAGE  06/01/2011   Procedure: CERCLAGE CERVICAL;  Surgeon: Philip Aspen, DO;  Location: [facility];  Service: Gynecology;  Laterality: N/A;  REQUESTING PORTABLE ULTARSOUND AT BEDSIDE PT'S EDC:12/02/2011   endosocpy     RIGHT/LEFT HEART CATH AND CORONARY ANGIOGRAPHY N/A 03/28/2023   Procedure: RIGHT/LEFT HEART CATH AND CORONARY ANGIOGRAPHY;  Surgeon: Dolores Patty, MD;  Location: [facility] INVASIVE CV LAB;  Service: Cardiovascular;  Laterality: N/A;   svd     x 2   tab     x 1   TUBAL LIGATION      Review of Systems:    All systems reviewed and negative except where noted in HPI.   Physical Examination:   BP 102/63   Pulse (!) 42   Temp 97.9 F (36.6 C)   Ht 5\' 5"  (1.651 m)   Wt 289 lb (131.1 kg)   BMI 48.09 kg/m   General: Well-nourished, well-developed in no acute distress.  Lungs: Clear to auscultation bilaterally. Non-labored. Heart: Regular rate and rhythm, no murmurs rubs or gallops.  Abdomen: Bowel sounds are normal; Abdomen is Soft; No hepatosplenomegaly, masses or hernias;  No Abdominal  Tenderness; No guarding or rebound tenderness. Neuro: Alert and oriented x 3.  Grossly intact.  Psych: Alert and cooperative, normal mood and affect.   Imaging Studies: US THYROID Result Date: 2023/09/18 CLINICAL DATA:  Evaluate thyroid nodules seen on prior chest CT from 10/30/2022. EXAM: THYROID ULTRASOUND TECHNIQUE: Ultrasound examination of the thyroid gland and adjacent soft tissues was performed. COMPARISON:  CT chest without contrast 10/30/2022 FINDINGS: Parenchymal Echotexture: Mildly heterogenous Isthmus: 0.7 cm Right lobe: 6.2 x 2.1 x 1.7 cm Left lobe: 5.6 x 2.2 x 1.8 cm _________________________________________________________ Estimated total number of nodules >/= 1 cm: 1 Number of spongiform nodules >/=  2 cm not described below (TR1): 0 Number of mixed cystic and solid nodules >/= 1.5 cm not described below (TR2): 0 _________________________________________________________ Nodule # 1: Location: Right; inferior Maximum size: 1.5 cm; Other 2 dimensions: 1.5 x 1.5 cm Composition: solid/almost completely solid (2) Echogenicity: hypoechoic (2) Shape: not taller-than-wide (0) Margins: ill-defined (0) Echogenic foci: none (0) ACR TI-RADS total points: 4. ACR TI-RADS risk category: TR4 (4-6 points). ACR TI-RADS recommendations: **Given size (>/= 1.5 cm) and appearance, fine needle aspiration of this moderately suspicious nodule should be considered based on TI-RADS criteria. This nodule corresponds to the abnormality seen on prior chest CT. _________________________________________________________ 0.6 cm nodule located posterior to the right thyroid lobe is suspicious for a parathyroid adenoma. _________________________________________________________ Subcentimeter left thyroid nodule measuring 0.4 cm does not meet criteria for FNA or imaging follow-up. IMPRESSION: 1. Nodule 1 (TI-RADS 4), measuring 1.5 cm, located in the inferior right thyroid lobe, meets criteria for FNA. 2. 0.6 cm hypoechoic solid  nodule located posterior to the right thyroid lobe is suspicious for a parathyroid adenoma. Please correlate with patient symptoms and laboratory workup for hyperparathyroidism. Nuclear medicine parathyroid scan may be obtained if clinically appropriate. The above is in keeping with the ACR TI-RADS recommendations - J Am Coll Radiol 2017;14:587-595. Electronically Signed   By: Acquanetta Belling M.D.   On: 09/09/2023 13:08    Assessment and Plan:   Heather Norman is a 48 y.o. y/o female returns for f/u of:  GERD - Improved on Current treatment.  Continue Pantoprazole 40mg  1 tablet once daily  Continue Pepcid 20mg  1 tablet twice daily  Continue Tums / Mylanta antacid as needed  Recommend Lifestyle Modifications to prevent Acid Reflux.  Rec. Avoid coffee, sodas, peppermint, garlic, onions, alcohol, citrus fruits, chocolate, tomatoes, fatty and spicey foods.  Avoid eating 2-3 hours before bedtime.    We discussed adverse side effects of PPIs to include vitamin deficiencies, osteoporosis, renal insufficiency.  Recommend take lowest effective dose of PPI necessary to control acid reflux.   Colon Cancer Screening  Continue Cologuard Test every 3 years  Celso Amy, PA-C  Follow up As Needed.

## 2023-10-01 ENCOUNTER — Ambulatory Visit (INDEPENDENT_AMBULATORY_CARE_PROVIDER_SITE_OTHER): Payer: Managed Care, Other (non HMO) | Admitting: Physician Assistant

## 2023-10-01 ENCOUNTER — Encounter: Payer: Self-pay | Admitting: Physician Assistant

## 2023-10-01 VITALS — BP 102/63 | HR 42 | Temp 97.9°F | Ht 65.0 in | Wt 289.0 lb

## 2023-10-01 DIAGNOSIS — K219 Gastro-esophageal reflux disease without esophagitis: Secondary | ICD-10-CM

## 2023-10-05 ENCOUNTER — Telehealth: Payer: Self-pay | Admitting: Internal Medicine

## 2023-10-05 NOTE — Telephone Encounter (Signed)
 Pt confirmed appt on 10/08/23

## 2023-10-08 ENCOUNTER — Encounter: Payer: Self-pay | Admitting: Internal Medicine

## 2023-10-08 ENCOUNTER — Ambulatory Visit (HOSPITAL_BASED_OUTPATIENT_CLINIC_OR_DEPARTMENT_OTHER): Payer: Managed Care, Other (non HMO) | Admitting: Internal Medicine

## 2023-10-08 ENCOUNTER — Other Ambulatory Visit
Admission: RE | Admit: 2023-10-08 | Discharge: 2023-10-08 | Disposition: A | Discharge status: Home / Self Care | Source: Ambulatory Visit | Attending: Internal Medicine | Admitting: Internal Medicine

## 2023-10-08 VITALS — BP 98/64 | Wt 291.5 lb

## 2023-10-08 DIAGNOSIS — I493 Ventricular premature depolarization: Secondary | ICD-10-CM

## 2023-10-08 DIAGNOSIS — I5032 Chronic diastolic (congestive) heart failure: Secondary | ICD-10-CM | POA: Diagnosis not present

## 2023-10-08 DIAGNOSIS — I10 Essential (primary) hypertension: Secondary | ICD-10-CM | POA: Diagnosis not present

## 2023-10-08 LAB — BASIC METABOLIC PANEL
Anion gap: 11 (ref 5–15)
BUN: 40 mg/dL — ABNORMAL HIGH (ref 6–20)
CO2: 28 mmol/L (ref 22–32)
Calcium: 9.8 mg/dL (ref 8.9–10.3)
Chloride: 98 mmol/L (ref 98–111)
Creatinine, Ser: 1.93 mg/dL — ABNORMAL HIGH (ref 0.44–1.00)
GFR, Estimated: 31 mL/min — ABNORMAL LOW (ref >60)
Glucose, Bld: 148 mg/dL — ABNORMAL HIGH (ref 70–99)
Potassium: 3 mmol/L — ABNORMAL LOW (ref 3.5–5.1)
Sodium: 137 mmol/L (ref 135–145)

## 2023-10-08 LAB — BRAIN NATRIURETIC PEPTIDE: B Natriuretic Peptide: 37.1 pg/mL (ref 0.0–100.0)

## 2023-10-08 MED ORDER — SACUBITRIL-VALSARTAN 49-51 MG PO TABS: 1.0000 | ORAL_TABLET | Freq: Two times a day (BID) | ORAL | 3 refills | Status: DC

## 2023-10-08 MED ORDER — CARVEDILOL 25 MG PO TABS: 12.5000 mg | ORAL_TABLET | Freq: Two times a day (BID) | ORAL | 3 refills | Status: DC

## 2023-10-08 NOTE — Patient Instructions (Signed)
 Medication Changes:  DECREASE Entresto to 49/51 mg Twice daily   DECREASE Carvedilol to 12.5 mg Twice daily   Lab Work:  Labs are needed today, please: Go over to the [facility]. Go pass the gift shop and have your blood work completed.  We will only call you if the results are abnormal or if the provider would like to make medication changes. Labs done today, your results will be available in MyChart, we will contact you for abnormal readings.  Special Instructions // Education:  Do the following things EVERYDAY: Weigh yourself in the morning before breakfast. Write it down and keep it in a log. Take your medicines as prescribed Eat low salt foods--Limit salt (sodium) to 2000 mg per day.  Stay as active as you can everyday Limit all fluids for the day to less than 2 liters   Follow-Up in: 1 month    If you have any questions or concerns before your next appointment please send [place] a message through mychart or call our office at [phone] Monday-Friday 8 am-5 pm.   If you have an urgent need after hours on the weekend please call your Primary Cardiologist or the [facility] in [place] at [phone].   At the [facility], you and your health needs are our priority. We have a designated team specialized in the treatment of Heart Failure. This Care Team includes your primary Heart Failure Specialized Cardiologist (physician), Advanced Practice Providers (APPs- Physician Assistants and Nurse Practitioners), and Pharmacist who all work together to provide you with the care you need, when you need it.   You may see any of the following providers on your designated Care Team at your next follow up:  Dr. Arvilla Meres Dr. Marca Ancona Dr. Dorthula Nettles Dr. Theresia Bough Tonye Becket, NP Robbie Lis, [place] [address] [place], [place] Brynda Peon, NP [place] Lee, NP Clarisa Kindred, NP Enos Fling, PharmD

## 2023-10-08 NOTE — Progress Notes (Signed)
 ADVANCED HF CLINIC NOTE  Referring Provider: Clarisa Kindred, NP Primary Care: Jodi Marble, NP Primary Cardiologist: Julien Nordmann, MD   HPI:  Heather Norman is a 48 y/o female with a history of arthritis, DM, hyperlipidemia, HTN, morbid obesity and chronic diastolic heart failure.   Echo 01/26/22 EF 55-60% mild LVH and trivial MR. Echo report 02/10/21 EF 50-55% G1DD   Zio 10/21 12.5% PVCs Saw EP Lalla Brothers) and suggested possible AAD (flecainide or propafenone) if developed symptoms or LV dysfunction.   Sleep study 07/24/22: AHI 35.4 Sats down to 75%.  Possible AF during study lasting 4.5 hours. Zio placed    Zio patch 1/24  - Sinus rhythm. No AF - 44 runs NSVT (longest 8 beats)  - 21.3% PVCs (3 morphologies 9.7%, 7.3%, 4.3%)  Saw Dr. Lalla Brothers in 3/24. felt not to be ablation candidate due to obesity. Recommended continue carvedilol as EF stable  Started on CPAP in 2024. Wore CPA for awhile but then got bronchitis and stopped.   Seen in ER 10/30/22 for CP. Hstrop, ECG and d-Dimer all normal. BNP elevated 47 -> 480 so torsemide increased.  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   Seen in ED on 08/15/23 for volume overload. Got IV lasix. Increased metolazone for 1 to 2 x/week. Unfortunately developed low BP and AKI (Scr 1.5 -> 2.5) and admitted 08/22/23. Given IVF. Now back on torsemide 60/40 and metolazone 1x/week. Fluid feels better. Breathing better. Now on CPAP. Working FT at [employer]. Can do all activities just takes her time. BP remains low. Feels dizzy.   Echo 1/25 EF 60-65% G1DD  RV mildly down. Personally reviewed  Past Medical History:  Diagnosis Date   (HFpEF) heart failure with preserved ejection fraction (HCC)    a. 04/2017 Echo: EF 55-60%, no rwma, Mild MR, mildly dil LA. Nl RV fxn; b. 06/2019 Echo: EF 60-65%, mod LVH. Gr2 DD. Mildly dil LA.   Arthritis    kness/hands - no meds   CHF (congestive  heart failure) (HCC)    Diabetes mellitus without complication (HCC)    DM (diabetes mellitus) (HCC) 05/11/2017   Dyspnea on exertion    Headache(784.0)    otc med prn   Heart murmur    Hyperlipidemia    no meds since pregnancy   Hypertension    Morbid obesity (HCC)     Current Outpatient Medications  Medication Sig Dispense Refill   albuterol (VENTOLIN HFA) 108 (90 Base) MCG/ACT inhaler Inhale 1-2 puffs into the lungs every 6 (six) hours as needed. 18 g 0   aluminum-magnesium hydroxide 200-200 MG/5ML suspension Take 15 mLs by mouth every 6 (six) hours as needed for indigestion.     aspirin EC 81 MG tablet Take 81 mg by mouth daily.     atorvastatin (LIPITOR) 20 MG tablet TAKE 1 TABLET(20 MG) BY MOUTH DAILY 90 tablet 3   carvedilol (COREG) 25 MG tablet TAKE 1 TABLET BY MOUTH IN THE MORNING AND 1 AND 1/2 TABLETS IN THE EVENING 225 tablet 3   cetirizine (ZYRTEC) 10 MG tablet Take 10 mg by mouth daily.     empagliflozin (JARDIANCE) 25 MG TABS tablet Take 1 tablet (25 mg total) by mouth daily before breakfast. 30 tablet 6   famotidine (PEPCID) 20 MG tablet Take 1 tablet (20 mg total) by mouth 2 (two) times daily.     famotidine-calcium carbonate-magnesium hydroxide (PEPCID  COMPLETE) 10-800-165 MG chewable tablet Chew 1 tablet by mouth daily as needed.     fluticasone (FLONASE) 50 MCG/ACT nasal spray Place 2 sprays into both nostrils daily.     latanoprost (XALATAN) 0.005 % ophthalmic solution Place 1 drop into both eyes at bedtime.     meclizine (ANTIVERT) 25 MG tablet Take 1 tablet (25 mg total) by mouth 3 (three) times daily as needed for dizziness or nausea. 30 tablet 1   metolazone (ZAROXOLYN) 2.5 MG tablet Take 1 tablet (2.5 mg total) by mouth once a week.     mexiletine (MEXITIL) 150 MG capsule Take 2 capsules (300 mg total) by mouth 2 (two) times daily. 360 capsule 3   pantoprazole (PROTONIX) 40 MG tablet Take 1 tablet (40 mg total) by mouth daily. 90 tablet 3   potassium chloride 20  MEQ/15ML (10%) SOLN Take 45 mLs (60 mEq total) by mouth daily. And additional on the days you take the metolazone     sacubitril-valsartan (ENTRESTO) 97-103 MG Take 1 tablet by mouth 2 (two) times daily. 90 tablet 3   Semaglutide,0.25 or 0.5MG /DOS, 2 MG/3ML SOPN Inject 0.25 mg into the skin once a week. 3 mL 5   spironolactone (ALDACTONE) 25 MG tablet TAKE 1 TABLET(25 MG) BY MOUTH DAILY 90 tablet 3   torsemide (DEMADEX) 20 MG tablet Take 4 tablets (80 mg total) by mouth daily. (Patient taking differently: Take 40 mg by mouth 2 (two) times daily. Take 60MG  in the morning and 40 MG at night) 180 tablet 6   No current facility-administered medications for this visit.    Allergies  Allergen Reactions   Penicillins Anaphylaxis    Has patient had a PCN reaction causing immediate rash, facial/tongue/throat swelling, SOB or lightheadedness with hypotension: Yes Has patient had a PCN reaction causing severe rash involving mucus membranes or skin necrosis: No Has patient had a PCN reaction that required hospitalization: No Has patient had a PCN reaction occurring within the last 10 years: No If all of the above answers are "NO", then may proceed with Cephalosporin use.      Social History   Socioeconomic History   Marital status: Single    Spouse name: Not on file   Number of children: 3   Years of education: 12   Highest education level: 12th grade  Occupational History   Not on file  Tobacco Use   Smoking status: Never   Smokeless tobacco: Never  Vaping Use   Vaping status: Never Used  Substance and Sexual Activity   Alcohol use: No   Drug use: No   Sexual activity: Yes    Birth control/protection: Surgical  Other Topics Concern   Not on file  Social History Narrative   Not on file   Social Drivers of Health   Financial Resource Strain: Medium Risk (08/13/2017)   Overall Financial Resource Strain (CARDIA)    Difficulty of Paying Living Expenses: Somewhat hard  Food  Insecurity: Food Insecurity Present (08/22/2023)   Hunger Vital Sign    Worried About Running Out of Food in the Last Year: Often true    Ran Out of Food in the Last Year: Sometimes true  Transportation Needs: No Transportation Needs (08/22/2023)   PRAPARE - Administrator, Civil Service (Medical): No    Lack of Transportation (Non-Medical): No  Physical Activity: Sufficiently Active (06/09/2019)   Exercise Vital Sign    Days of Exercise per Week: 5 days    Minutes  of Exercise per Session: 150+ min  Stress: Stress Concern Present (08/13/2017)   [employer] of Occupational Health - Occupational Stress Questionnaire    Feeling of Stress : Rather much  Social Connections: Moderately Isolated (08/13/2017)   Social Connection and Isolation Panel [NHANES]    Frequency of Communication with Friends and Family: More than three times a week    Frequency of Social Gatherings with Friends and Family: Never    Attends Religious Services: Never    Database administrator or Organizations: No    Attends Banker Meetings: Never    Marital Status: Never married  Intimate Partner Violence: Not At Risk (08/22/2023)   Humiliation, Afraid, Rape, and Kick questionnaire    Fear of Current or Ex-Partner: No    Emotionally Abused: No    Physically Abused: No    Sexually Abused: No      Family History  Problem Relation Age of Onset   Diabetes Mother    Hypertension Mother    Hyperlipidemia Mother    Hypertension Father    Breast cancer Maternal Grandmother 19    Vitals:   10/08/23 1154  BP: (!) 88/50  Weight: 291 lb 8 oz (132.2 kg)    Wt Readings from Last 3 Encounters:  10/08/23 291 lb 8 oz (132.2 kg)  10/01/23 289 lb (131.1 kg)  09/11/23 269 lb 9.6 oz (122.3 kg)    PHYSICAL EXAM: Vitals:   10/08/23 1154  BP: (!) 88/50   General:  Well appearing. No resp difficulty HEENT: normal Neck: supple. no JVD. Carotids 2+ bilat; no bruits. No lymphadenopathy or  thryomegaly appreciated. Cor: PMI nondisplaced. Regular rate & rhythm. No rubs, gallops or murmurs. Lungs: clear Abdomen: obese soft, nontender, nondistended. No hepatosplenomegaly. No bruits or masses. Good bowel sounds. Extremities: no cyanosis, clubbing, rash, edema Neuro: alert & orientedx3, cranial nerves grossly intact. moves all 4 extremities w/o difficulty. Affect pleasant   ASSESSMENT & PLAN:   1. 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 BNP 47 -> 580 - Improved NHYA II-III - Volume sttus much improved. Continue torsemide 60/40 and metolazone 1x/week - Continue Jardiance 10  - Continue spiro 25 daily - BP low  - Decrease Entresto to 49/51 - Decrease carvedilol from 25/37.5 to 12.5 bid  - Can consider Cardiomems as needed - Labs today  2. HTN - Blood pressure low.  - Changes as above.    3. PVC's, frequent - Zio 10/21 12.5% PVCs Saw EP Lalla Brothers) and suggested possible AAD (flecainide or propafenone) if developed symptoms or LV dysfunction.  - Zio patch 1/24. Sinus No AF. 44 runs NSVT (longest 8 beats). 21.3% PVCs (3 morphologies 9.7%, 7.3%, 4.3%) - suspect driven by OSA but could aslo be infiltrative process (? Sarcoid) -> Check cMRI - Saw Dr. Lalla Brothers 3/24 felt not to be ablation candidate due to obesity. Recommended continue carvedilol as EF stable - PVCs not improve with CPAP - Now on mexilitene 300 bid - Still awaiting cMRI (insurance has not approved yet). Will reach out to her insurance company   4. Possible AF  - noted on sleep study and AppleWatch - No AF on Zio -> suspect this is likely PVCs  5. OSA, severe - Sleep study 07/24/22: AHI 35.4 Sats down to 75%.  - now on CPAP  6. Morbid obesity - Continue GLP1RA  Arvilla Meres, MD  12:20 PM

## 2023-10-09 ENCOUNTER — Ambulatory Visit: Payer: Managed Care, Other (non HMO) | Admitting: Internal Medicine

## 2023-10-09 ENCOUNTER — Encounter: Payer: Self-pay | Admitting: Internal Medicine

## 2023-10-09 VITALS — BP 126/76 | HR 60 | Temp 97.6°F | Ht 65.0 in | Wt 294.8 lb

## 2023-10-09 DIAGNOSIS — Z6841 Body Mass Index (BMI) 40.0 and over, adult: Secondary | ICD-10-CM

## 2023-10-09 DIAGNOSIS — R5381 Other malaise: Secondary | ICD-10-CM | POA: Diagnosis not present

## 2023-10-09 DIAGNOSIS — E669 Obesity, unspecified: Secondary | ICD-10-CM

## 2023-10-09 DIAGNOSIS — G4733 Obstructive sleep apnea (adult) (pediatric): Secondary | ICD-10-CM

## 2023-10-09 DIAGNOSIS — Z9989 Dependence on other enabling machines and devices: Secondary | ICD-10-CM

## 2023-10-09 NOTE — Progress Notes (Signed)
 Name: Heather Norman MRN: [id number] DOB: 1976-02-25    CHIEF COMPLAINT:  Follow Up Assessment of Sleep Anea   HISTORY OF PRESENT ILLNESS: Follow up OSA assessment HST performed shows AHI 35  CPAP download reviewed in detail March 2025 Excellent compliance report 100% for days and greater than 4 hours Auto CPAP 14-20 AHI reduced to 0.5 Patient uses and benefits from therapy Using CPAP nightly and with naps Pressure setting is comfortable and is sleeping well.   Discussed sleep data and reviewed with patient.  Encouraged proper weight management.  Discussed driving precautions and its relationship with hypersomnolence.  Discussed operating dangerous equipment and its relationship with hypersomnolence.  Discussed sleep hygiene, and benefits of a fixed sleep waked time.  The importance of getting eight or more hours of sleep discussed with patient.  Discussed limiting the use of the computer and television before bedtime.  Decrease naps during the day, so night time sleep will become enhanced.  Limit caffeine, and sleep deprivation.  HTN, stroke, and heart failure are potential risk factors.   No exacerbation at this time No evidence of heart failure at this time No evidence or signs of infection at this time No respiratory distress No fevers, chills, nausea, vomiting, diarrhea No evidence of lower extremity edema No evidence hemoptysis  PAST MEDICAL HISTORY :   has a past medical history of (HFpEF) heart failure with preserved ejection fraction (HCC), Arthritis, CHF (congestive heart failure) (HCC), Diabetes mellitus without complication (HCC), DM (diabetes mellitus) (HCC) (05/11/2017), Dyspnea on exertion, Headache(784.0), Heart murmur, Hyperlipidemia, Hypertension, and Morbid obesity (HCC).  has a past surgical history that includes Cervical cerclage (06/01/2011); svd; endosocpy; tab; Cervical cerclage (06/01/2011); Tubal ligation; and RIGHT/LEFT HEART CATH AND CORONARY  ANGIOGRAPHY (N/A, 03/28/2023). Prior to Admission medications   Medication Sig Start Date End Date Taking? Authorizing Provider  albuterol (VENTOLIN HFA) 108 (90 Base) MCG/ACT inhaler Inhale 1-2 puffs into the lungs every 6 (six) hours as needed. 07/20/22  Yes Mickie Bail, NP  aluminum-magnesium hydroxide 200-200 MG/5ML suspension Take 15 mLs by mouth every 6 (six) hours as needed for indigestion.   Yes [provider]  aspirin EC 81 MG tablet Take 81 mg by mouth daily.   Yes [provider]  atorvastatin (LIPITOR) 20 MG tablet TAKE 1 TABLET(20 MG) BY MOUTH DAILY 12/26/22  Yes [name], FNP  carvedilol (COREG) 25 MG tablet Take 0.5 tablets (12.5 mg total) by mouth 2 (two) times daily with a meal. 10/08/23  Yes Bensimhon, Bevelyn Buckles, MD  cetirizine (ZYRTEC) 10 MG tablet Take 10 mg by mouth daily. 08/21/23  Yes [provider]  empagliflozin (JARDIANCE) 25 MG TABS tablet Take 1 tablet (25 mg total) by mouth daily before breakfast. 01/25/23  Yes [name], FNP  famotidine (PEPCID) 20 MG tablet Take 1 tablet (20 mg total) by mouth 2 (two) times daily. 02/20/23  Yes Celso Amy, PA-C  famotidine-calcium carbonate-magnesium hydroxide (PEPCID COMPLETE) 10-800-165 MG chewable tablet Chew 1 tablet by mouth daily as needed.   Yes [provider]  fluticasone (FLONASE) 50 MCG/ACT nasal spray Place 2 sprays into both nostrils daily. 08/21/23  Yes [provider]  latanoprost (XALATAN) 0.005 % ophthalmic solution Place 1 drop into both eyes at bedtime. 01/17/22  Yes [provider]  meclizine (ANTIVERT) 25 MG tablet Take 1 tablet (25 mg total) by mouth 3 (three) times daily as needed for dizziness or nausea. 08/23/22  Yes Sharman Cheek, MD  metolazone (  ZAROXOLYN) 2.5 MG tablet Take 1 tablet (2.5 mg total) by mouth once a week. 08/24/23  Yes Sreenath, Sudheer B, MD  mexiletine (MEXITIL) 150 MG capsule Take 2 capsules (300 mg total) by mouth 2 (two)  times daily. 03/26/23  Yes Bensimhon, Bevelyn Buckles, MD  pantoprazole (PROTONIX) 40 MG tablet Take 1 tablet (40 mg total) by mouth daily. 02/20/23 02/15/24 Yes Celso Amy, PA-C  potassium chloride 20 MEQ/15ML (10%) SOLN Take 45 mLs (60 mEq total) by mouth daily. And additional on the days you take the metolazone 03/27/23  Yes Milford, Anderson Malta, FNP  sacubitril-valsartan (ENTRESTO) 49-51 MG Take 1 tablet by mouth 2 (two) times daily. 10/08/23  Yes Bensimhon, Bevelyn Buckles, MD  Semaglutide,0.25 or 0.5MG /DOS, 2 MG/3ML SOPN Inject 0.25 mg into the skin once a week. 11/06/22  Yes Hackney, Inetta Fermo A, FNP  spironolactone (ALDACTONE) 25 MG tablet TAKE 1 TABLET(25 MG) BY MOUTH DAILY 04/13/22  Yes Gollan, Tollie Pizza, MD  torsemide (DEMADEX) 20 MG tablet Take 4 tablets (80 mg total) by mouth daily. Patient taking differently: Take 40 mg by mouth 2 (two) times daily. Take 60MG  in the morning and 40 MG at night 03/05/23  Yes Milford, Anderson Malta, [place]   Allergies  Allergen Reactions   Penicillins Anaphylaxis    Has patient had a PCN reaction causing immediate rash, facial/tongue/throat swelling, SOB or lightheadedness with hypotension: Yes Has patient had a PCN reaction causing severe rash involving mucus membranes or skin necrosis: No Has patient had a PCN reaction that required hospitalization: No Has patient had a PCN reaction occurring within the last 10 years: No If all of the above answers are "NO", then may proceed with Cephalosporin use.    FAMILY HISTORY:  family history includes Breast cancer (age of onset: 45) in her maternal grandmother; Diabetes in her mother; Hyperlipidemia in her mother; Hypertension in her father and mother. SOCIAL HISTORY:  reports that she has never smoked. She has never used smokeless tobacco. She reports that she does not drink alcohol and does not use drugs.   BP 126/76 (BP Location: Left Wrist, Patient Position: Sitting, Cuff Size: Normal)   Pulse 60   Temp 97.6 F (36.4 C)  (Temporal)   Ht 5\' 5"  (1.651 m)   Wt 294 lb 12.8 oz (133.7 kg)   SpO2 96%   BMI 49.06 kg/m     Review of Systems: Gen:  Denies  fever, sweats, chills weight loss  HEENT: Denies blurred vision, double vision, ear pain, eye pain, hearing loss, nose bleeds, sore throat Cardiac:  No dizziness, chest pain or heaviness, chest tightness,edema, No JVD Resp:   No cough, -sputum production, -shortness of breath,-wheezing, -hemoptysis,  Other:  All other systems negative   Physical Examination:   General Appearance: No distress  EYES PERRLA, EOM intact.   NECK Supple, No JVD ORAL CAVITY MALLAMPATI 4 Pulmonary: normal breath sounds, No wheezing.  CardiovascularNormal S1,S2.  No m/r/g.   Abdomen: Benign, Soft, non-tender. Neurology UE/LE 5/5 strength, no focal deficits Ext pulses intact, cap refill intact ALL OTHER ROS ARE NEGATIVE    ASSESSMENT AND PLAN SYNOPSIS 48 year old morbidly obese African-American female with severe sleep apnea and deconditioned state   Assessment of Sleep apnea Patient has excellent compliance report Discussed in detail with patient OSA is well-controlled with CPAP Continue current prescription Patient uses and benefits from therapy Using CPAP nightly.  pressure setting is comfortable and she is sleeping well. Auto CPAP 14-20 AHI reduced to 0.5  Patient Instructions Continue to use CPAP every night, minimum of 4-6 hours a night.  Change equipment every 30 days or as directed by DME.  Wash your tubing with warm soap and water daily, hang to dry. Wash humidifier portion weekly. Use bottled, distilled water and change daily   Be aware of reduced alertness and do not drive or operate heavy machinery if experiencing this or drowsiness.  Exercise encouraged, as tolerated. Encouraged proper weight management.  Important to get eight or more hours of sleep  Limiting the use of the computer and television before bedtime.  Decrease naps during the day, so  night time sleep will become enhanced.  Limit caffeine, and sleep deprivation.  HTN, stroke, uncontrolled diabetes and heart failure are potential risk factors.  Risk of untreated sleep apnea including cardiac arrhthymias, stroke, DM, pulm HTN.   Obesity -recommend significant weight loss -recommend changing diet  Deconditioned state -Recommend increased daily activity and exercise    MEDICATION ADJUSTMENTS/LABS AND TESTS ORDERED: Continue CPAP as prescribed Avoid Allergens and Irritants Avoid secondhand smoke Avoid SICK contacts Recommend  Masking  when appropriate Recommend Keep up-to-date with vaccinations    CURRENT MEDICATIONS REVIEWED AT LENGTH WITH PATIENT TODAY   Patient  satisfied with Plan of action and management. All questions answered   Follow up 6 months   I spent a total of 41 minutes reviewing chart data, face-to-face evaluation with the patient, counseling and coordination of care as detailed above.      Lucie Leather, M.D.  [facility]  Medical Director [facility] Medical Director [facility] Cardio-Pulmonary Department

## 2023-10-09 NOTE — Patient Instructions (Signed)
 Excellent Job!! A+   Continue CPAP as prescribed  Avoid Allergens and Irritants Avoid secondhand smoke Avoid SICK contacts Recommend  Masking  when appropriate Recommend Keep up-to-date with vaccinations   Be aware of reduced alertness and do not drive or operate heavy machinery if experiencing this or drowsiness.  Exercise encouraged, as tolerated. Encouraged proper weight management.  Important to get eight or more hours of sleep  Limiting the use of the computer and television before bedtime.  Decrease naps during the day, so night time sleep will become enhanced.  Limit caffeine, and sleep deprivation.

## 2023-10-11 ENCOUNTER — Encounter: Payer: Self-pay | Admitting: *Deleted

## 2023-10-11 ENCOUNTER — Encounter: Attending: Cardiovascular Disease | Admitting: *Deleted

## 2023-10-11 DIAGNOSIS — I5032 Chronic diastolic (congestive) heart failure: Secondary | ICD-10-CM | POA: Insufficient documentation

## 2023-10-11 NOTE — Progress Notes (Signed)
 Virtual orientation call completed today. shehas an appointment on Date: 10/22/2023  for EP eval and gym Orientation.  Documentation of diagnosis can be found in [facility] 08/28/2023 .

## 2023-10-12 ENCOUNTER — Telehealth (HOSPITAL_COMMUNITY): Payer: Self-pay | Admitting: Cardiology

## 2023-10-12 DIAGNOSIS — I5032 Chronic diastolic (congestive) heart failure: Secondary | ICD-10-CM

## 2023-10-12 DIAGNOSIS — E876 Hypokalemia: Secondary | ICD-10-CM

## 2023-10-12 MED ORDER — POTASSIUM CHLORIDE 20 MEQ/15ML (10%) PO SOLN
80.0000 meq | Freq: Every day | ORAL | 6 refills | Status: DC
Start: 2023-10-12 — End: 2024-03-24

## 2023-10-12 NOTE — Telephone Encounter (Signed)
-----   Message from Arvilla Meres sent at 10/08/2023  5:14 PM EDT ----- Please have her take 80 kcl x1 then increase home dose by 20 daily. Repeat labs on thursday

## 2023-10-12 NOTE — Telephone Encounter (Signed)
 Pt aware of lab results  Reports she may have been taking kcl incorrect with medication in liquid form  (Reports she take 3 capfuls daily-60 mls/35meq, however unsure if/how she takes additional on metolazone days)  Educated on dosage based on results  Take 80 meq ( ) today, since Dr Gala Romney is increasing dosage by 20 meq this will also be new daily dose  Will continue to take additional 60 meq (45 mls) on metolazone days  -additional changes will come from repeat labs   Repeat bmet x 1 week [facility]

## 2023-10-16 NOTE — Progress Notes (Unsigned)
    GYNECOLOGY PROGRESS NOTE  Subjective:  PCP: Jodi Marble, NP  Patient ID: Heather Norman, female    DOB: November 12, 1975, 48 y.o.   MRN: [id number]  HPI  Patient is a 48 y.o. [id number] female who presents for [facility] referring for  uterine bleeding 2 years post menopause. Notably, pt has recently resumed taking GLPI.   {Common ambulatory SmartLinks:19316}  Review of Systems {ros; complete:30496}   Objective:   There were no vitals taken for this visit. There is no height or weight on file to calculate BMI.  General appearance: {general exam:16600} Abdomen: {abdominal exam:16834} Pelvic: {pelvic exam:16852::"cervix normal in appearance","external genitalia normal","no adnexal masses or tenderness","no cervical motion tenderness","rectovaginal septum normal","uterus normal size, shape, and consistency","vagina normal without discharge"} Extremities: {extremity exam:5109} Neurologic: {neuro exam:17854}   Assessment/Plan:   No diagnosis found.   There are no diagnoses linked to this encounter.     Julieanne Manson, DO [facility]

## 2023-10-17 ENCOUNTER — Ambulatory Visit (INDEPENDENT_AMBULATORY_CARE_PROVIDER_SITE_OTHER): Admitting: Obstetrics

## 2023-10-17 ENCOUNTER — Encounter: Payer: Self-pay | Admitting: Obstetrics

## 2023-10-17 ENCOUNTER — Other Ambulatory Visit (HOSPITAL_COMMUNITY)
Admission: RE | Admit: 2023-10-17 | Discharge: 2023-10-17 | Disposition: A | Discharge status: Home / Self Care | Source: Ambulatory Visit | Attending: Obstetrics | Admitting: Obstetrics

## 2023-10-17 VITALS — BP 109/54 | HR 79 | Ht 65.0 in | Wt 292.0 lb

## 2023-10-17 DIAGNOSIS — N898 Other specified noninflammatory disorders of vagina: Secondary | ICD-10-CM | POA: Diagnosis not present

## 2023-10-17 DIAGNOSIS — N939 Abnormal uterine and vaginal bleeding, unspecified: Secondary | ICD-10-CM | POA: Diagnosis present

## 2023-10-17 DIAGNOSIS — N915 Oligomenorrhea, unspecified: Secondary | ICD-10-CM

## 2023-10-18 ENCOUNTER — Other Ambulatory Visit
Admission: RE | Admit: 2023-10-18 | Discharge: 2023-10-18 | Disposition: A | Discharge status: Home / Self Care | Attending: Internal Medicine | Admitting: Internal Medicine

## 2023-10-18 DIAGNOSIS — E876 Hypokalemia: Secondary | ICD-10-CM | POA: Insufficient documentation

## 2023-10-18 DIAGNOSIS — I5032 Chronic diastolic (congestive) heart failure: Secondary | ICD-10-CM | POA: Diagnosis present

## 2023-10-18 LAB — BASIC METABOLIC PANEL
Anion gap: 7 (ref 5–15)
BUN: 32 mg/dL — ABNORMAL HIGH (ref 6–20)
CO2: 28 mmol/L (ref 22–32)
Calcium: 9.3 mg/dL (ref 8.9–10.3)
Chloride: 101 mmol/L (ref 98–111)
Creatinine, Ser: 1.83 mg/dL — ABNORMAL HIGH (ref 0.44–1.00)
GFR, Estimated: 33 mL/min — ABNORMAL LOW (ref >60)
Glucose, Bld: 151 mg/dL — ABNORMAL HIGH (ref 70–99)
Potassium: 4.4 mmol/L (ref 3.5–5.1)
Sodium: 136 mmol/L (ref 135–145)

## 2023-10-18 LAB — PROLACTIN: Prolactin: 11.9 ng/mL (ref 4.8–33.4)

## 2023-10-18 LAB — HCG, SERUM, QUALITATIVE: hCG,Beta Subunit,Qual,Serum: NEGATIVE m[IU]/mL (ref <6)

## 2023-10-18 LAB — TSH RFX ON ABNORMAL TO FREE T4: TSH: 2.8 u[IU]/mL (ref 0.450–4.500)

## 2023-10-18 LAB — ESTRADIOL: Estradiol: 185 pg/mL

## 2023-10-18 LAB — FSH/LH
FSH: 8.4 m[IU]/mL
LH: 17.7 m[IU]/mL

## 2023-10-18 NOTE — Addendum Note (Signed)
 Addended by: Yehuda Savannah on: 10/18/2023 08:34 AM   Modules accepted: Orders

## 2023-10-19 LAB — SURGICAL PATHOLOGY

## 2023-10-22 ENCOUNTER — Encounter

## 2023-10-22 VITALS — Ht 66.5 in | Wt 290.9 lb

## 2023-10-22 DIAGNOSIS — I5032 Chronic diastolic (congestive) heart failure: Secondary | ICD-10-CM | POA: Diagnosis present

## 2023-10-22 NOTE — Progress Notes (Signed)
 Pulmonary Individual Treatment Plan  Patient Details  Name: Heather Norman MRN: [id number] Date of Birth: 26-Sep-1975 Referring Provider:   Flowsheet Row Pulmonary Rehab from 10/22/2023 in [facility] Cardiac and Pulmonary Rehab  Referring Provider Dr. Julien Nordmann, MD       Initial Encounter Date:  Flowsheet Row Pulmonary Rehab from 10/22/2023 in [facility] Cardiac and Pulmonary Rehab  Date 10/22/23       Visit Diagnosis: Heart failure, diastolic, chronic (HCC)  Patient's Home Medications on Admission:  Current Outpatient Medications:    albuterol (VENTOLIN HFA) 108 (90 Base) MCG/ACT inhaler, Inhale 1-2 puffs into the lungs every 6 (six) hours as needed., Disp: 18 g, Rfl: 0   aluminum-magnesium hydroxide 200-200 MG/5ML suspension, Take 15 mLs by mouth every 6 (six) hours as needed for indigestion., Disp: , Rfl:    aspirin EC 81 MG tablet, Take 81 mg by mouth daily., Disp: , Rfl:    atorvastatin (LIPITOR) 20 MG tablet, TAKE 1 TABLET(20 MG) BY MOUTH DAILY, Disp: 90 tablet, Rfl: 3   carvedilol (COREG) 25 MG tablet, Take 0.5 tablets (12.5 mg total) by mouth 2 (two) times daily with a meal., Disp: 225 tablet, Rfl: 3   cetirizine (ZYRTEC) 10 MG tablet, Take 10 mg by mouth daily., Disp: , Rfl:    empagliflozin (JARDIANCE) 25 MG TABS tablet, Take 1 tablet (25 mg total) by mouth daily before breakfast., Disp: 30 tablet, Rfl: 6   famotidine (PEPCID) 20 MG tablet, Take 1 tablet (20 mg total) by mouth 2 (two) times daily., Disp: , Rfl:    famotidine-calcium carbonate-magnesium hydroxide (PEPCID COMPLETE) 10-800-165 MG chewable tablet, Chew 1 tablet by mouth daily as needed., Disp: , Rfl:    fluticasone (FLONASE) 50 MCG/ACT nasal spray, Place 2 sprays into both nostrils daily., Disp: , Rfl:    latanoprost (XALATAN) 0.005 % ophthalmic solution, Place 1 drop into both eyes at bedtime., Disp: , Rfl:    meclizine (ANTIVERT) 25 MG tablet, Take 1 tablet (25 mg total) by mouth 3 (three) times daily as needed for  dizziness or nausea., Disp: 30 tablet, Rfl: 1   metolazone (ZAROXOLYN) 2.5 MG tablet, Take 1 tablet (2.5 mg total) by mouth once a week., Disp: , Rfl:    mexiletine (MEXITIL) 150 MG capsule, Take 2 capsules (300 mg total) by mouth 2 (two) times daily., Disp: 360 capsule, Rfl: 3   pantoprazole (PROTONIX) 40 MG tablet, Take 1 tablet (40 mg total) by mouth daily., Disp: 90 tablet, Rfl: 3   potassium chloride 20 MEQ/15ML (10%) SOLN, Take 60 mLs (80 mEq total) by mouth daily. And additional ( ) on the days you take the metolazone, Disp: 473 mL, Rfl: 6   sacubitril-valsartan (ENTRESTO) 49-51 MG, Take 1 tablet by mouth 2 (two) times daily., Disp: 60 tablet, Rfl: 3   Semaglutide,0.25 or 0.5MG /DOS, 2 MG/3ML SOPN, Inject 0.25 mg into the skin once a week., Disp: 3 mL, Rfl: 5   spironolactone (ALDACTONE) 25 MG tablet, TAKE 1 TABLET(25 MG) BY MOUTH DAILY, Disp: 90 tablet, Rfl: 3   torsemide (DEMADEX) 20 MG tablet, Take 4 tablets (80 mg total) by mouth daily. (Patient taking differently: Take 40 mg by mouth 2 (two) times daily. Take 60MG  in the morning and 40 MG at night), Disp: 180 tablet, Rfl: 6  Past Medical History: Past Medical History:  Diagnosis Date   (HFpEF) heart failure with preserved ejection fraction (HCC)    a. 04/2017 Echo: EF 55-60%, no rwma, Mild MR, mildly dil LA.  Nl RV fxn; b. 06/2019 Echo: EF 60-65%, mod LVH. Gr2 DD. Mildly dil LA.   Arthritis    kness/hands - no meds   CHF (congestive heart failure) (HCC)    Diabetes mellitus without complication (HCC)    DM (diabetes mellitus) (HCC) 05/11/2017   Dyspnea on exertion    Headache(784.0)    otc med prn   Heart murmur    Hyperlipidemia    no meds since pregnancy   Hypertension    Morbid obesity (HCC)     Tobacco Use: Social History   Tobacco Use  Smoking Status Never  Smokeless Tobacco Never    Labs: Review Flowsheet  More data may exist      Latest Ref Rng & Units 12/13/2012 01/14/2015 06/05/2020 03/28/2023  08/22/2023  Labs for ITP Cardiac and Pulmonary Rehab  Cholestrol 0 - 200 mg/dL 846  962  952  - -  LDL (calc) 0 - 99 mg/dL 841  324  401  - -  HDL-C >40 mg/dL 52  42  48  - -  Trlycerides <150 mg/dL 84  027  253  - -  Hemoglobin A1c 4.8 - 5.6 % 6.6  - - - 8.5   PH, Arterial 7.35 - 7.45 - - - 7.372  -  PCO2 arterial 32 - 48 mmHg - - - 38.3  -  Bicarbonate 20.0 - 28.0 mmol/L - - - 18.9  24.8  22.2  -  TCO2 22 - 32 mmol/L - - - 20  26  23   -  Acid-base deficit 0.0 - 2.0 mmol/L - - - 7.0  1.0  3.0  -  O2 Saturation % - - - 71  73  96  -    Details       Multiple values from one day are sorted in reverse-chronological order          Pulmonary Assessment Scores:  Pulmonary Assessment Scores     Row Name 10/22/23 1532         ADL UCSD   ADL Phase Entry     SOB Score total 20     Rest 0     Walk 0     Stairs 4     Bath 0     Dress 0     Shop 0       CAT Score   CAT Score 20       mMRC Score   mMRC Score 2              UCSD: Self-administered rating of dyspnea associated with activities of daily living (ADLs) 6-point scale (0 = "not at all" to 5 = "maximal or unable to do because of breathlessness")  Scoring Scores range from 0 to 120.  Minimally important difference is 5 units  CAT: CAT can identify the health impairment of COPD patients and is better correlated with disease progression.  CAT has a scoring range of zero to 40. The CAT score is classified into four groups of low (less than 10), medium (10 - 20), high (21-30) and very high (31-40) based on the impact level of disease on health status. A CAT score over 10 suggests significant symptoms.  A worsening CAT score could be explained by an exacerbation, poor medication adherence, poor inhaler technique, or progression of COPD or comorbid conditions.  CAT MCID is 2 points  mMRC: mMRC (Modified Medical Research Council) Dyspnea Scale is used to assess the degree of baseline  functional disability in patients  of respiratory disease due to dyspnea. No minimal important difference is established. A decrease in score of 1 point or greater is considered a positive change.   Pulmonary Function Assessment:   Exercise Target Goals: Exercise Program Goal: Individual exercise prescription set using results from initial 6 min walk test and THRR while considering  patient's activity barriers and safety.   Exercise Prescription Goal: Initial exercise prescription builds to 30-45 minutes a day of aerobic activity, 2-3 days per week.  Home exercise guidelines will be given to patient during program as part of exercise prescription that the participant will acknowledge.  Education: Aerobic Exercise: - Group verbal and visual presentation on the components of exercise prescription. Introduces F.I.T.T principle from ACSM for exercise prescriptions.  Reviews F.I.T.T. principles of aerobic exercise including progression. Written material given at graduation. Flowsheet Row Pulmonary Rehab from 10/22/2023 in [facility] Cardiac and Pulmonary Rehab  Education need identified 10/22/23       Education: Resistance Exercise: - Group verbal and visual presentation on the components of exercise prescription. Introduces F.I.T.T principle from ACSM for exercise prescriptions  Reviews F.I.T.T. principles of resistance exercise including progression. Written material given at graduation.    Education: Exercise & Equipment Safety: - Individual verbal instruction and demonstration of equipment use and safety with use of the equipment. Flowsheet Row Pulmonary Rehab from 10/22/2023 in [facility] Cardiac and Pulmonary Rehab  Date 10/22/23  Educator NT  Instruction Review Code 1- Verbalizes Understanding       Education: Exercise Physiology & General Exercise Guidelines: - Group verbal and written instruction with models to review the exercise physiology of the cardiovascular system and associated critical values. Provides general  exercise guidelines with specific guidelines to those with heart or lung disease.    Education: Flexibility, Balance, Mind/Body Relaxation: - Group verbal and visual presentation with interactive activity on the components of exercise prescription. Introduces F.I.T.T principle from ACSM for exercise prescriptions. Reviews F.I.T.T. principles of flexibility and balance exercise training including progression. Also discusses the mind body connection.  Reviews various relaxation techniques to help reduce and manage stress (i.e. Deep breathing, progressive muscle relaxation, and visualization). Balance handout provided to take home. Written material given at graduation.   Activity Barriers & Risk Stratification:  Activity Barriers & Cardiac Risk Stratification - 10/22/23 1536       Activity Barriers & Cardiac Risk Stratification   Activity Barriers Shortness of Breath;Balance Concerns   Vertigo            6 Minute Walk:  6 Minute Walk     Row Name 10/22/23 1534         6 Minute Walk   Phase Initial     Distance 940 feet     Walk Time 6 minutes     # of Rest Breaks 0     MPH 1.78     METS 2.46     RPE 11     Perceived Dyspnea  0     VO2 Peak 8.6     Symptoms Yes (comment)     Comments burning in legs     Resting HR 58 bpm     Resting BP 112/72     Resting Oxygen Saturation  94 %     Exercise Oxygen Saturation  during 6 min walk 93 %     Max Ex. HR 119 bpm     Max Ex. BP 124/76     2 Minute Post BP 106/70  Interval HR   1 Minute HR 111     2 Minute HR 101     3 Minute HR 108     4 Minute HR 84     5 Minute HR 108     6 Minute HR 119     2 Minute Post HR 78     Interval Heart Rate? Yes       Interval Oxygen   Interval Oxygen? Yes     Baseline Oxygen Saturation % 94 %     1 Minute Oxygen Saturation % 93 %     1 Minute Liters of Oxygen 0 L  RA     2 Minute Oxygen Saturation % 94 %     2 Minute Liters of Oxygen 0 L     3 Minute Oxygen Saturation % 93 %      3 Minute Liters of Oxygen 0 L     4 Minute Oxygen Saturation % 95 %     4 Minute Liters of Oxygen 0 L     5 Minute Oxygen Saturation % 94 %     5 Minute Liters of Oxygen 0 L     6 Minute Oxygen Saturation % 94 %     6 Minute Liters of Oxygen 0 L     2 Minute Post Oxygen Saturation % 95 %     2 Minute Post Liters of Oxygen 0 L             Oxygen Initial Assessment:  Oxygen Initial Assessment - 10/11/23 1251       Home Oxygen   Home Oxygen Device None    Sleep Oxygen Prescription None    Home Exercise Oxygen Prescription None    Home Resting Oxygen Prescription None    Compliance with Home Oxygen Use Yes      Intervention   Short Term Goals To learn and demonstrate proper use of respiratory medications;To learn and demonstrate proper pursed lip breathing techniques or other breathing techniques.     Long  Term Goals Demonstrates proper use of MDI's;Compliance with respiratory medication;Exhibits proper breathing techniques, such as pursed lip breathing or other method taught during program session             Oxygen Re-Evaluation:   Oxygen Discharge (Final Oxygen Re-Evaluation):   Initial Exercise Prescription:  Initial Exercise Prescription - 10/22/23 1500       Date of Initial Exercise RX and Referring Provider   Date 10/22/23    Referring Provider Dr. Julien Nordmann, MD      Oxygen   Maintain Oxygen Saturation 88% or higher      Treadmill   MPH 1.8    Grade 0.5    Minutes 15    METs 2.5      Recumbant Bike   Level 1    RPM 50    Watts 19    Minutes 15    METs 2.46      NuStep   Level 2    SPM 80    Minutes 15    METs 2.46      Prescription Details   Frequency (times per week) 3    Duration Progress to 30 minutes of continuous aerobic without signs/symptoms of physical distress      Intensity   THRR 40-80% of Max Heartrate 103-148    Ratings of Perceived Exertion 11-13    Perceived Dyspnea 0-4      Progression   Progression  Continue to  progress workloads to maintain intensity without signs/symptoms of physical distress.      Resistance Training   Training Prescription Yes    Weight 3 lb    Reps 10-15             Perform Capillary Blood Glucose checks as needed.  Exercise Prescription Changes:   Exercise Prescription Changes     Row Name 10/22/23 1500             Response to Exercise   Blood Pressure (Admit) 112/72       Blood Pressure (Exercise) 124/76       Blood Pressure (Exit) 106/70       Heart Rate (Admit) 58 bpm       Heart Rate (Exercise) 119 bpm       Heart Rate (Exit) 78 bpm       Oxygen Saturation (Admit) 94 %       Oxygen Saturation (Exercise) 93 %       Oxygen Saturation (Exit) 95 %       Rating of Perceived Exertion (Exercise) 11       Perceived Dyspnea (Exercise) 0       Symptoms burning in legs       Comments Results                Exercise Comments:   Exercise Goals and Review:   Exercise Goals     Row Name 10/22/23 1537             Exercise Goals   Increase Physical Activity Yes       Intervention Provide advice, education, support and counseling about physical activity/exercise needs.;Develop an individualized exercise prescription for aerobic and resistive training based on initial evaluation findings, risk stratification, comorbidities and participant's personal goals.       Expected Outcomes Short Term: Attend rehab on a regular basis to increase amount of physical activity.;Long Term: Exercising regularly at least 3-5 days a week.;Long Term: Add in home exercise to make exercise part of routine and to increase amount of physical activity.       Increase Strength and Stamina Yes       Intervention Provide advice, education, support and counseling about physical activity/exercise needs.;Develop an individualized exercise prescription for aerobic and resistive training based on initial evaluation findings, risk stratification, comorbidities and participant's  personal goals.       Expected Outcomes Short Term: Increase workloads from initial exercise prescription for resistance, speed, and METs.;Short Term: Perform resistance training exercises routinely during rehab and add in resistance training at home;Long Term: Improve cardiorespiratory fitness, muscular endurance and strength as measured by increased METs and functional capacity ( )       Able to understand and use rate of perceived exertion (RPE) scale Yes       Intervention Provide education and explanation on how to use RPE scale       Expected Outcomes Short Term: Able to use RPE daily in rehab to express subjective intensity level;Long Term:  Able to use RPE to guide intensity level when exercising independently       Able to understand and use Dyspnea scale Yes       Intervention Provide education and explanation on how to use Dyspnea scale       Expected Outcomes Short Term: Able to use Dyspnea scale daily in rehab to express subjective sense of shortness of breath during exertion;Long Term: Able to use Dyspnea scale  to guide intensity level when exercising independently       Knowledge and understanding of Target Heart Rate Range (THRR) Yes       Intervention Provide education and explanation of THRR including how the numbers were predicted and where they are located for reference       Expected Outcomes Long Term: Able to use THRR to govern intensity when exercising independently;Short Term: Able to state/look up THRR;Short Term: Able to use daily as guideline for intensity in rehab       Able to check pulse independently Yes       Intervention Provide education and demonstration on how to check pulse in carotid and radial arteries.;Review the importance of being able to check your own pulse for safety during independent exercise       Expected Outcomes Short Term: Able to explain why pulse checking is important during independent exercise;Long Term: Able to check pulse independently and  accurately       Understanding of Exercise Prescription Yes       Intervention Provide education, explanation, and written materials on patient's individual exercise prescription       Expected Outcomes Short Term: Able to explain program exercise prescription;Long Term: Able to explain home exercise prescription to exercise independently                Exercise Goals Re-Evaluation :   Discharge Exercise Prescription (Final Exercise Prescription Changes):  Exercise Prescription Changes - 10/22/23 1500       Response to Exercise   Blood Pressure (Admit) 112/72    Blood Pressure (Exercise) 124/76    Blood Pressure (Exit) 106/70    Heart Rate (Admit) 58 bpm    Heart Rate (Exercise) 119 bpm    Heart Rate (Exit) 78 bpm    Oxygen Saturation (Admit) 94 %    Oxygen Saturation (Exercise) 93 %    Oxygen Saturation (Exit) 95 %    Rating of Perceived Exertion (Exercise) 11    Perceived Dyspnea (Exercise) 0    Symptoms burning in legs    Comments Results             Nutrition:  Target Goals: Understanding of nutrition guidelines, daily intake of sodium 1500mg , cholesterol 200mg , calories 30% from fat and 7% or less from saturated fats, daily to have 5 or more servings of fruits and vegetables.  Education: All About Nutrition: -Group instruction provided by verbal, written material, interactive activities, discussions, models, and posters to present general guidelines for heart healthy nutrition including fat, fiber, MyPlate, the role of sodium in heart healthy nutrition, utilization of the nutrition label, and utilization of this knowledge for meal planning. Follow up email sent as well. Written material given at graduation. Flowsheet Row Pulmonary Rehab from 10/22/2023 in [facility] Cardiac and Pulmonary Rehab  Education need identified 10/22/23       Biometrics:  Pre Biometrics - 10/22/23 1537       Pre Biometrics   Height 5' 6.5" (1.689 m)    Weight 290 lb 14.4 oz (132  kg)    Waist Circumference 50.5 inches    Hip Circumference 55 inches    Waist to Hip Ratio 0.92 %    BMI (Calculated) 46.25    Single Leg Stand 3.45 seconds              Nutrition Therapy Plan and Nutrition Goals:  Nutrition Therapy & Goals - 10/22/23 1449       Nutrition Therapy  Protein (specify units) 70-90    Fiber 25 grams    Whole Grain Foods 3 servings    Saturated Fats 15 max. grams    Fruits and Vegetables 5 servings/day    Sodium 2 grams      Personal Nutrition Goals   Nutrition Goal Eat 15-30gProtein and 30-60gCarbs at each meal.    Personal Goal #2 Read labels and reduce sodium intake to below 2300mg . Ideally 1500mg  per day.    Personal Goal #3 Include more colorful produce, aim for 5-8 servings of fruits and veggies per day    Comments Patient drinking less sugary beverages. Has been trying to better control her DM. Discussed some of her meals. Breakfast being oatmeal with cranberries and raisin with a fruit smoothie. Together walked through her breakfast highlighting  her high carb intake and lack of protein. Provided handout on pairing carbs with protein. Educated on carb sources and set goal of eating ~30-60g per meal. Reviewed mediterranean diet handout and educated on types of fats sources and how to read facts labels. She would like to meal prep more and include more veggies at meals.      Intervention Plan   Intervention Nutrition handout(s) given to patient.;Prescribe, educate and counsel regarding individualized specific dietary modifications aiming towards targeted core components such as weight, hypertension, lipid management, diabetes, heart failure and other comorbidities.    Expected Outcomes Long Term Goal: Adherence to prescribed nutrition plan.;Short Term Goal: A plan has been developed with personal nutrition goals set during dietitian appointment.;Short Term Goal: Understand basic principles of dietary content, such as calories, fat, sodium,  cholesterol and nutrients.             Nutrition Assessments:  MEDIFICTS Score Key: >=70 Need to make dietary changes  40-70 Heart Healthy Diet <= 40 Therapeutic Level Cholesterol Diet  Flowsheet Row Pulmonary Rehab from 10/22/2023 in [facility] Cardiac and Pulmonary Rehab  Picture Your Plate Total Score on Admission 64      Picture Your Plate Scores: <40 Unhealthy dietary pattern with much room for improvement. 41-50 Dietary pattern unlikely to meet recommendations for good health and room for improvement. 51-60 More healthful dietary pattern, with some room for improvement.  >60 Healthy dietary pattern, although there may be some specific behaviors that could be improved.   Nutrition Goals Re-Evaluation:   Nutrition Goals Discharge (Final Nutrition Goals Re-Evaluation):   Psychosocial: Target Goals: Acknowledge presence or absence of significant depression and/or stress, maximize coping skills, provide positive support system. Participant is able to verbalize types and ability to use techniques and skills needed for reducing stress and depression.   Education: Stress, Anxiety, and Depression - Group verbal and visual presentation to define topics covered.  Reviews how body is impacted by stress, anxiety, and depression.  Also discusses healthy ways to reduce stress and to treat/manage anxiety and depression.  Written material given at graduation.   Education: Sleep Hygiene -Provides group verbal and written instruction about how sleep can affect your health.  Define sleep hygiene, discuss sleep cycles and impact of sleep habits. Review good sleep hygiene tips.    Initial Review & Psychosocial Screening:  Initial Psych Review & Screening - 10/11/23 1256       Initial Review   Current issues with Current Stress Concerns    Source of Stress Concerns Chronic Illness    Comments depression comes and goes does not last   seeing a therapist.      Family Dynamics   Good  Support System? Yes   family, Mom     Barriers   Psychosocial barriers to participate in program There are no identifiable barriers or psychosocial needs.      Screening Interventions   Interventions To provide support and resources with identified psychosocial needs;Provide feedback about the scores to participant    Expected Outcomes Short Term goal: Utilizing psychosocial counselor, staff and physician to assist with identification of specific Stressors or current issues interfering with healing process. Setting desired goal for each stressor or current issue identified.;Long Term Goal: Stressors or current issues are controlled or eliminated.;Short Term goal: Identification and review with participant of any Quality of Life or Depression concerns found by scoring the questionnaire.;Long Term goal: The participant improves quality of Life and PHQ9 Scores as seen by post scores and/or verbalization of changes             Quality of Life Scores:  Scores of 19 and below usually indicate a poorer quality of life in these areas.  A difference of  2-3 points is a clinically meaningful difference.  A difference of 2-3 points in the total score of the Quality of Life Index has been associated with significant improvement in overall quality of life, self-image, physical symptoms, and general health in studies assessing change in quality of life.  PHQ-9: Review Flowsheet  More data exists      10/22/2023 03/14/2022 09/16/2021 04/22/2018 03/18/2018  Depression screen PHQ 2/9  Decreased Interest 1 0 1 0 0  Down, Depressed, Hopeless 1 0 1 0 0  PHQ - 2 Score 2 0 2 0 0  Altered sleeping 0 - 2 - -  Tired, decreased energy 0 - 1 - -  Change in appetite 0 - 1 - -  Feeling bad or failure about yourself  1 - 0 - -  Trouble concentrating 0 - 1 - -  Moving slowly or fidgety/restless 0 - 0 - -  Suicidal thoughts 0 - 0 - -  PHQ-9 Score 3 - 7 - -  Difficult doing work/chores Not difficult at all - Somewhat  difficult - -   Interpretation of Total Score  Total Score Depression Severity:  1-4 = Minimal depression, 5-9 = Mild depression, 10-14 = Moderate depression, 15-19 = Moderately severe depression, 20-27 = Severe depression   Psychosocial Evaluation and Intervention:  Psychosocial Evaluation - 10/11/23 1306       Psychosocial Evaluation & Interventions   Interventions Encouraged to exercise with the program and follow exercise prescription    Comments There are no barriers to starting the program.  She has support from her family, mostly her Mom.  She does see a therapist, on no meds at this time.  Recognizes that she has has had depression at times and it does not last long.  She is ready to learn about managing her weight and improving her lifestyle.    Expected Outcomes STG attend all scheduled sessions, meet with RD  for diabetes and weight loss guidance, and follow the guidance for  exercise progression   LTG Able to maintain her exercise progression, continues to work on her weight loss goals and managing her diabetes    Continue Psychosocial Services  Follow up required by staff             Psychosocial Re-Evaluation:   Psychosocial Discharge (Final Psychosocial Re-Evaluation):   Education: Education Goals: Education classes will be provided on a weekly basis, covering required topics. Participant will state understanding/return demonstration of  topics presented.  Learning Barriers/Preferences:  Learning Barriers/Preferences - 10/11/23 1259       Learning Barriers/Preferences   Learning Barriers Hearing   deaf in left ear,   Learning Preferences None             General Pulmonary Education Topics:  Infection Prevention: - Provides verbal and written material to individual with discussion of infection control including proper hand washing and proper equipment cleaning during exercise session. Flowsheet Row Pulmonary Rehab from 10/22/2023 in [facility] Cardiac and  Pulmonary Rehab  Date 10/22/23  Educator NT  Instruction Review Code 1- Verbalizes Understanding       Falls Prevention: - Provides verbal and written material to individual with discussion of falls prevention and safety. Flowsheet Row Pulmonary Rehab from 10/22/2023 in [facility] Cardiac and Pulmonary Rehab  Date 10/11/23  Educator SB  Instruction Review Code 1- Verbalizes Understanding       Chronic Lung Disease Review: - Group verbal instruction with posters, models, PowerPoint presentations and videos,  to review new updates, new respiratory medications, new advancements in procedures and treatments. Providing information on websites and "800" numbers for continued self-education. Includes information about supplement oxygen, available portable oxygen systems, continuous and intermittent flow rates, oxygen safety, concentrators, and Medicare reimbursement for oxygen. Explanation of Pulmonary Drugs, including class, frequency, complications, importance of spacers, rinsing mouth after steroid MDI's, and proper cleaning methods for nebulizers. Review of basic lung anatomy and physiology related to function, structure, and complications of lung disease. Review of risk factors. Discussion about methods for diagnosing sleep apnea and types of masks and machines for OSA. Includes a review of the use of types of environmental controls: home humidity, furnaces, filters, dust mite/pet prevention, HEPA vacuums. Discussion about weather changes, air quality and the benefits of nasal washing. Instruction on Warning signs, infection symptoms, calling MD promptly, preventive modes, and value of vaccinations. Review of effective airway clearance, coughing and/or vibration techniques. Emphasizing that all should Create an Action Plan. Written material given at graduation.   AED/CPR: - Group verbal and written instruction with the use of models to demonstrate the basic use of the AED with the basic ABC's of  resuscitation.    Anatomy and Cardiac Procedures: - Group verbal and visual presentation and models provide information about basic cardiac anatomy and function. Reviews the testing methods done to diagnose heart disease and the outcomes of the test results. Describes the treatment choices: Medical Management, Angioplasty, or Coronary Bypass Surgery for treating various heart conditions including Myocardial Infarction, Angina, Valve Disease, and Cardiac Arrhythmias.  Written material given at graduation. Flowsheet Row Pulmonary Rehab from 10/22/2023 in [facility] Cardiac and Pulmonary Rehab  Education need identified 10/22/23       Medication Safety: - Group verbal and visual instruction to review commonly prescribed medications for heart and lung disease. Reviews the medication, class of the drug, and side effects. Includes the steps to properly store meds and maintain the prescription regimen.  Written material given at graduation.   Other: -Provides group and verbal instruction on various topics (see comments)   Knowledge Questionnaire Score:  Knowledge Questionnaire Score - 10/22/23 1533       Knowledge Questionnaire Score   Pre Score 23/26              Core Components/Risk Factors/Patient Goals at Admission:  Personal Goals and Risk Factors at Admission - 10/11/23 1301       Core Components/Risk Factors/Patient Goals on Admission    Weight Management Yes  Intervention Weight Management: Develop a combined nutrition and exercise program designed to reach desired caloric intake, while maintaining appropriate intake of nutrient and fiber, sodium and fats, and appropriate energy expenditure required for the weight goal.    Admit Weight 288 lb (130.6 kg)    Goal Weight: Short Term 286 lb (129.7 kg)    Goal Weight: Long Term 239 lb (108.4 kg)    Expected Outcomes Short Term: Continue to assess and modify interventions until short term weight is achieved;Long Term: Adherence to  nutrition and physical activity/exercise program aimed toward attainment of established weight goal;Weight Loss: Understanding of general recommendations for a balanced deficit meal plan, which promotes 1-2 lb weight loss per week and includes a negative energy balance of [phone] kcal/d    Improve shortness of breath with ADL's Yes    Intervention Provide education, individualized exercise plan and daily activity instruction to help decrease symptoms of SOB with activities of daily living.    Expected Outcomes Short Term: Improve cardiorespiratory fitness to achieve a reduction of symptoms when performing ADLs;Long Term: Be able to perform more ADLs without symptoms or delay the onset of symptoms    Increase knowledge of respiratory medications and ability to use respiratory devices properly  Yes    Intervention Provide education and demonstration as needed of appropriate use of medications, inhalers, and oxygen therapy.    Expected Outcomes Short Term: Achieves understanding of medications use. Understands that oxygen is a medication prescribed by physician. Demonstrates appropriate use of inhaler and oxygen therapy.;Long Term: Maintain appropriate use of medications, inhalers, and oxygen therapy.    Diabetes Yes    Intervention Provide education about signs/symptoms and action to take for hypo/hyperglycemia.;Provide education about proper nutrition, including hydration, and aerobic/resistive exercise prescription along with prescribed medications to achieve blood glucose in normal ranges: Fasting glucose 65-99 mg/dL    Expected Outcomes Short Term: Participant verbalizes understanding of the signs/symptoms and immediate care of hyper/hypoglycemia, proper foot care and importance of medication, aerobic/resistive exercise and nutrition plan for blood glucose control.;Long Term: Attainment of HbA1C < 7%.    Heart Failure Yes    Intervention Provide a combined exercise and nutrition program that is  supplemented with education, support and counseling about heart failure. Directed toward relieving symptoms such as shortness of breath, decreased exercise tolerance, and extremity edema.    Expected Outcomes Improve functional capacity of life;Short term: Attendance in program 2-3 days a week with increased exercise capacity. Reported lower sodium intake. Reported increased fruit and vegetable intake. Reports medication compliance.;Short term: Daily weights obtained and reported for increase. Utilizing diuretic protocols set by physician.;Long term: Adoption of self-care skills and reduction of barriers for early signs and symptoms recognition and intervention leading to self-care maintenance.    Hypertension Yes    Intervention Provide education on lifestyle modifcations including regular physical activity/exercise, weight management, moderate sodium restriction and increased consumption of fresh fruit, vegetables, and low fat dairy, alcohol moderation, and smoking cessation.;Monitor prescription use compliance.    Expected Outcomes Short Term: Continued assessment and intervention until BP is < 140/30mm HG in hypertensive participants. < 130/78mm HG in hypertensive participants with diabetes, heart failure or chronic kidney disease.;Long Term: Maintenance of blood pressure at goal levels.    Lipids Yes    Intervention Provide education and support for participant on nutrition & aerobic/resistive exercise along with prescribed medications to achieve LDL 70mg , HDL >40mg .    Expected Outcomes Short Term: Participant states understanding of desired cholesterol values and  is compliant with medications prescribed. Participant is following exercise prescription and nutrition guidelines.;Long Term: Cholesterol controlled with medications as prescribed, with individualized exercise RX and with personalized nutrition plan. Value goals: LDL < 70mg , HDL > 40 mg.             Education:Diabetes - Individual  verbal and written instruction to review signs/symptoms of diabetes, desired ranges of glucose level fasting, after meals and with exercise. Acknowledge that pre and post exercise glucose checks will be done for 3 sessions at entry of program. Flowsheet Row Pulmonary Rehab from 10/22/2023 in [facility] Cardiac and Pulmonary Rehab  Date 10/22/23  Educator NT  Instruction Review Code 1- Verbalizes Understanding       Know Your Numbers and Heart Failure: - Group verbal and visual instruction to discuss disease risk factors for cardiac and pulmonary disease and treatment options.  Reviews associated critical values for Overweight/Obesity, Hypertension, Cholesterol, and Diabetes.  Discusses basics of heart failure: signs/symptoms and treatments.  Introduces Heart Failure Zone chart for action plan for heart failure.  Written material given at graduation.   Core Components/Risk Factors/Patient Goals Review:    Core Components/Risk Factors/Patient Goals at Discharge (Final Review):    ITP Comments:  ITP Comments     Row Name 10/11/23 1311 10/22/23 1532         ITP Comments Virtual orientation call completed today. shehas an appointment on Date: 10/22/2023  for EP eval and gym Orientation.  Documentation of diagnosis can be found in [facility] 08/28/2023 . Completed and gym orientation. Initial ITP created and sent for review to Dr. Jinny Sanders, Medical Director.               Comments: Initial ITP

## 2023-10-22 NOTE — Patient Instructions (Addendum)
 Patient Instructions  Patient Details  Name: Heather Norman MRN: [id number] Date of Birth: 27-Dec-1975 Referring Provider:  [name], MD  Below are your personal goals for exercise, nutrition, and risk factors. Our goal is to help you stay on track towards obtaining and maintaining these goals. We will be discussing your progress on these goals with you throughout the program.  Initial Exercise Prescription:  Initial Exercise Prescription - 10/22/23 1500       Date of Initial Exercise RX and Referring Provider   Date 10/22/23    Referring Provider Dr. Julien Nordmann, MD      Oxygen   Maintain Oxygen Saturation 88% or higher      Treadmill   MPH 1.8    Grade 0.5    Minutes 15    METs 2.5      Recumbant Bike   Level 1    RPM 50    Watts 19    Minutes 15    METs 2.46      NuStep   Level 2    SPM 80    Minutes 15    METs 2.46      Prescription Details   Frequency (times per week) 3    Duration Progress to 30 minutes of continuous aerobic without signs/symptoms of physical distress      Intensity   THRR 40-80% of Max Heartrate 103-148    Ratings of Perceived Exertion 11-13    Perceived Dyspnea 0-4      Progression   Progression Continue to progress workloads to maintain intensity without signs/symptoms of physical distress.      Resistance Training   Training Prescription Yes    Weight 3 lb    Reps 10-15             Exercise Goals: Frequency: Be able to perform aerobic exercise two to three times per week in program working toward 2-5 days per week of home exercise.  Intensity: Work with a perceived exertion of 11 (fairly light) - 15 (hard) while following your exercise prescription.  We will make changes to your prescription with you as you progress through the program.   Duration: Be able to do 30 to 45 minutes of continuous aerobic exercise in addition to a 5 minute warm-up and a 5 minute cool-down routine.   Nutrition Goals: Your personal  nutrition goals will be established when you do your nutrition analysis with the dietician.  The following are general nutrition guidelines to follow: Cholesterol < 200mg /day Sodium < 1500mg /day Fiber: Women under 50 yrs - 25 grams per day  Personal Goals:  Personal Goals and Risk Factors at Admission - 10/11/23 1301       Core Components/Risk Factors/Patient Goals on Admission    Weight Management Yes    Intervention Weight Management: Develop a combined nutrition and exercise program designed to reach desired caloric intake, while maintaining appropriate intake of nutrient and fiber, sodium and fats, and appropriate energy expenditure required for the weight goal.    Admit Weight 288 lb (130.6 kg)    Goal Weight: Short Term 286 lb (129.7 kg)    Goal Weight: Long Term 239 lb (108.4 kg)    Expected Outcomes Short Term: Continue to assess and modify interventions until short term weight is achieved;Long Term: Adherence to nutrition and physical activity/exercise program aimed toward attainment of established weight goal;Weight Loss: Understanding of general recommendations for a balanced deficit meal plan, which promotes 1-2 lb weight loss per  week and includes a negative energy balance of [phone] kcal/d    Improve shortness of breath with ADL's Yes    Intervention Provide education, individualized exercise plan and daily activity instruction to help decrease symptoms of SOB with activities of daily living.    Expected Outcomes Short Term: Improve cardiorespiratory fitness to achieve a reduction of symptoms when performing ADLs;Long Term: Be able to perform more ADLs without symptoms or delay the onset of symptoms    Increase knowledge of respiratory medications and ability to use respiratory devices properly  Yes    Intervention Provide education and demonstration as needed of appropriate use of medications, inhalers, and oxygen therapy.    Expected Outcomes Short Term: Achieves understanding  of medications use. Understands that oxygen is a medication prescribed by physician. Demonstrates appropriate use of inhaler and oxygen therapy.;Long Term: Maintain appropriate use of medications, inhalers, and oxygen therapy.    Diabetes Yes    Intervention Provide education about signs/symptoms and action to take for hypo/hyperglycemia.;Provide education about proper nutrition, including hydration, and aerobic/resistive exercise prescription along with prescribed medications to achieve blood glucose in normal ranges: Fasting glucose 65-99 mg/dL    Expected Outcomes Short Term: Participant verbalizes understanding of the signs/symptoms and immediate care of hyper/hypoglycemia, proper foot care and importance of medication, aerobic/resistive exercise and nutrition plan for blood glucose control.;Long Term: Attainment of HbA1C < 7%.    Heart Failure Yes    Intervention Provide a combined exercise and nutrition program that is supplemented with education, support and counseling about heart failure. Directed toward relieving symptoms such as shortness of breath, decreased exercise tolerance, and extremity edema.    Expected Outcomes Improve functional capacity of life;Short term: Attendance in program 2-3 days a week with increased exercise capacity. Reported lower sodium intake. Reported increased fruit and vegetable intake. Reports medication compliance.;Short term: Daily weights obtained and reported for increase. Utilizing diuretic protocols set by physician.;Long term: Adoption of self-care skills and reduction of barriers for early signs and symptoms recognition and intervention leading to self-care maintenance.    Hypertension Yes    Intervention Provide education on lifestyle modifcations including regular physical activity/exercise, weight management, moderate sodium restriction and increased consumption of fresh fruit, vegetables, and low fat dairy, alcohol moderation, and smoking cessation.;Monitor  prescription use compliance.    Expected Outcomes Short Term: Continued assessment and intervention until BP is < 140/67mm HG in hypertensive participants. < 130/4mm HG in hypertensive participants with diabetes, heart failure or chronic kidney disease.;Long Term: Maintenance of blood pressure at goal levels.    Lipids Yes    Intervention Provide education and support for participant on nutrition & aerobic/resistive exercise along with prescribed medications to achieve LDL 70mg , HDL >40mg .    Expected Outcomes Short Term: Participant states understanding of desired cholesterol values and is compliant with medications prescribed. Participant is following exercise prescription and nutrition guidelines.;Long Term: Cholesterol controlled with medications as prescribed, with individualized exercise RX and with personalized nutrition plan. Value goals: LDL < 70mg , HDL > 40 mg.            Exercise Goals and Review:  Exercise Goals     Row Name 10/22/23 1537             Exercise Goals   Increase Physical Activity Yes       Intervention Provide advice, education, support and counseling about physical activity/exercise needs.;Develop an individualized exercise prescription for aerobic and resistive training based on initial evaluation findings, risk stratification, comorbidities and  participant's personal goals.       Expected Outcomes Short Term: Attend rehab on a regular basis to increase amount of physical activity.;Long Term: Exercising regularly at least 3-5 days a week.;Long Term: Add in home exercise to make exercise part of routine and to increase amount of physical activity.       Increase Strength and Stamina Yes       Intervention Provide advice, education, support and counseling about physical activity/exercise needs.;Develop an individualized exercise prescription for aerobic and resistive training based on initial evaluation findings, risk stratification, comorbidities and  participant's personal goals.       Expected Outcomes Short Term: Increase workloads from initial exercise prescription for resistance, speed, and METs.;Short Term: Perform resistance training exercises routinely during rehab and add in resistance training at home;Long Term: Improve cardiorespiratory fitness, muscular endurance and strength as measured by increased METs and functional capacity ( )       Able to understand and use rate of perceived exertion (RPE) scale Yes       Intervention Provide education and explanation on how to use RPE scale       Expected Outcomes Short Term: Able to use RPE daily in rehab to express subjective intensity level;Long Term:  Able to use RPE to guide intensity level when exercising independently       Able to understand and use Dyspnea scale Yes       Intervention Provide education and explanation on how to use Dyspnea scale       Expected Outcomes Short Term: Able to use Dyspnea scale daily in rehab to express subjective sense of shortness of breath during exertion;Long Term: Able to use Dyspnea scale to guide intensity level when exercising independently       Knowledge and understanding of Target Heart Rate Range (THRR) Yes       Intervention Provide education and explanation of THRR including how the numbers were predicted and where they are located for reference       Expected Outcomes Long Term: Able to use THRR to govern intensity when exercising independently;Short Term: Able to state/look up THRR;Short Term: Able to use daily as guideline for intensity in rehab       Able to check pulse independently Yes       Intervention Provide education and demonstration on how to check pulse in carotid and radial arteries.;Review the importance of being able to check your own pulse for safety during independent exercise       Expected Outcomes Short Term: Able to explain why pulse checking is important during independent exercise;Long Term: Able to check pulse  independently and accurately       Understanding of Exercise Prescription Yes       Intervention Provide education, explanation, and written materials on patient's individual exercise prescription       Expected Outcomes Short Term: Able to explain program exercise prescription;Long Term: Able to explain home exercise prescription to exercise independently

## 2023-10-22 NOTE — Progress Notes (Signed)
 Assessment start time: 11:43 AM  Digestive issues/concerns: no known food allergies   Education r/t nutrition plan Patient drinking less sugary beverages. Has been trying to better control her DM. Discussed some of her meals. Breakfast being oatmeal with cranberries and raisin with a fruit smoothie. Together walked through her breakfast highlighting  her high carb intake and lack of protein. Provided handout on pairing carbs with protein. Educated on carb sources and set goal of eating ~30-60g per meal. Reviewed mediterranean diet handout and educated on types of fats sources and how to read facts labels. She would like to meal prep more and include more veggies at meals.    Goal 1: Eat 15-30gProtein and 30-60gCarbs at each meal. Goal 2: Read labels and reduce sodium intake to below 2300mg . Ideally 1500mg  per day.  Goal 3: Include more colorful produce, aim for 5-8 servings of fruits and veggies per day  End time 12:36 PM

## 2023-10-23 ENCOUNTER — Encounter: Payer: Self-pay | Admitting: Obstetrics

## 2023-10-23 ENCOUNTER — Ambulatory Visit
Admission: RE | Admit: 2023-10-23 | Discharge: 2023-10-23 | Disposition: A | Discharge status: Home / Self Care | Source: Ambulatory Visit | Attending: Obstetrics | Admitting: Obstetrics

## 2023-10-23 DIAGNOSIS — N939 Abnormal uterine and vaginal bleeding, unspecified: Secondary | ICD-10-CM | POA: Insufficient documentation

## 2023-10-23 DIAGNOSIS — N915 Oligomenorrhea, unspecified: Secondary | ICD-10-CM | POA: Diagnosis present

## 2023-10-24 ENCOUNTER — Encounter: Admitting: *Deleted

## 2023-10-24 DIAGNOSIS — I5032 Chronic diastolic (congestive) heart failure: Secondary | ICD-10-CM | POA: Diagnosis not present

## 2023-10-24 LAB — GLUCOSE, CAPILLARY
Glucose-Capillary: 142 mg/dL — ABNORMAL HIGH (ref 70–99)
Glucose-Capillary: 118 mg/dL — ABNORMAL HIGH (ref 70–99)

## 2023-10-24 NOTE — Progress Notes (Signed)
 Daily Session Note  Patient Details  Name: Heather Norman MRN: [id number] Date of Birth: 06/05/1976 Referring Provider:   Flowsheet Row Pulmonary Rehab from 10/22/2023 in [facility] Cardiac and Pulmonary Rehab  Referring Provider Dr. Julien Nordmann, MD       Encounter Date: 10/24/2023  Check In:  Session Check In - 10/24/23 1714       Check-In   Supervising physician immediately available to respond to emergencies See telemetry face sheet for immediately available ER MD    Location [facility]-Cardiac & Pulmonary Rehab    Staff Present Elige Ko RCP,RRT,BSRT;Deral Schellenberg Jewel Baize RN,BSN;Kelly Madilyn Fireman BS, ACSM CEP, Exercise Physiologist    Virtual Visit No    Medication changes reported     No    Fall or balance concerns reported    No    Warm-up and Cool-down Performed on first and last piece of equipment    Resistance Training Performed Yes    VAD Patient? No    PAD/SET Patient? No      Pain Assessment   Currently in Pain? No/denies                Social History   Tobacco Use  Smoking Status Never  Smokeless Tobacco Never    Goals Met:  Independence with exercise equipment Exercise tolerated well No report of concerns or symptoms today Strength training completed today  Goals Unmet:  Not Applicable  Comments: First full day of exercise!  Patient was oriented to gym and equipment including functions, settings, policies, and procedures.  Patient's individual exercise prescription and treatment plan were reviewed.  All starting workloads were established based on the results of the 6 minute walk test done at initial orientation visit.  The plan for exercise progression was also introduced and progression will be customized based on patient's performance and goals.    Dr. Bethann Punches is Medical Director for [facility] Cardiac Rehabilitation.  Dr. Vida Rigger is Medical Director for [facility] Pulmonary Rehabilitation.

## 2023-10-25 ENCOUNTER — Encounter: Admitting: *Deleted

## 2023-10-25 DIAGNOSIS — I5032 Chronic diastolic (congestive) heart failure: Secondary | ICD-10-CM

## 2023-10-25 LAB — GLUCOSE, CAPILLARY
Glucose-Capillary: 149 mg/dL — ABNORMAL HIGH (ref 70–99)
Glucose-Capillary: 118 mg/dL — ABNORMAL HIGH (ref 70–99)

## 2023-10-25 NOTE — Progress Notes (Signed)
 Daily Session Note  Patient Details  Name: Heather Norman MRN: [id number] Date of Birth: Apr 24, 1976 Referring Provider:   Flowsheet Row Pulmonary Rehab from 10/22/2023 in [facility] Cardiac and Pulmonary Rehab  Referring Provider Dr. Julien Nordmann, MD       Encounter Date: 10/25/2023  Check In:  Session Check In - 10/25/23 1709       Check-In   Supervising physician immediately available to respond to emergencies See telemetry face sheet for immediately available ER MD    Location [facility]-Cardiac & Pulmonary Rehab    Staff Present Susann Givens RN,BSN;Joseph [facility] BS, Exercise Physiologist    Virtual Visit No    Medication changes reported     No    Fall or balance concerns reported    No    Warm-up and Cool-down Performed on first and last piece of equipment    Resistance Training Performed Yes    VAD Patient? No    PAD/SET Patient? No      Pain Assessment   Currently in Pain? No/denies                Social History   Tobacco Use  Smoking Status Never  Smokeless Tobacco Never    Goals Met:  Independence with exercise equipment Exercise tolerated well No report of concerns or symptoms today Strength training completed today  Goals Unmet:  Not Applicable  Comments: Pt able to follow exercise prescription today without complaint.  Will continue to monitor for progression.    Dr. Bethann Punches is Medical Director for [facility] Cardiac Rehabilitation.  Dr. Vida Rigger is Medical Director for [facility] Pulmonary Rehabilitation.

## 2023-10-29 ENCOUNTER — Encounter: Admitting: *Deleted

## 2023-10-29 DIAGNOSIS — I5032 Chronic diastolic (congestive) heart failure: Secondary | ICD-10-CM | POA: Diagnosis not present

## 2023-10-29 LAB — GLUCOSE, CAPILLARY
Glucose-Capillary: 241 mg/dL — ABNORMAL HIGH (ref 70–99)
Glucose-Capillary: 157 mg/dL — ABNORMAL HIGH (ref 70–99)

## 2023-10-29 NOTE — Progress Notes (Signed)
 Daily Session Note  Patient Details  Name: Heather Norman MRN: [id number] Date of Birth: 1975-11-02 Referring Provider:   Flowsheet Row Pulmonary Rehab from 10/22/2023 in [facility] Cardiac and Pulmonary Rehab  Referring Provider Dr. Julien Nordmann, MD       Encounter Date: 10/29/2023  Check In:  Session Check In - 10/29/23 1139       Check-In   Supervising physician immediately available to respond to emergencies See telemetry face sheet for immediately available ER MD    Location [facility]-Cardiac & Pulmonary Rehab    Staff Present Rory Percy, MS, Exercise Physiologist;Elven Laboy, RN, BSN, CCRP;Maxon Conetta BS, Exercise Physiologist;Kelly Cloretta Ned, ACSM CEP, Exercise Physiologist    Virtual Visit No    Medication changes reported     No    Fall or balance concerns reported    No    Warm-up and Cool-down Performed on first and last piece of equipment    Resistance Training Performed Yes    VAD Patient? No    PAD/SET Patient? No      Pain Assessment   Currently in Pain? No/denies                Social History   Tobacco Use  Smoking Status Never  Smokeless Tobacco Never    Goals Met:  Proper associated with RPD/PD & O2 Sat Independence with exercise equipment Exercise tolerated well No report of concerns or symptoms today  Goals Unmet:  Not Applicable  Comments: Pt able to follow exercise prescription today without complaint.  Will continue to monitor for progression.    Dr. Bethann Punches is Medical Director for [facility] Cardiac Rehabilitation.  Dr. Vida Rigger is Medical Director for [facility] Pulmonary Rehabilitation.

## 2023-10-30 ENCOUNTER — Ambulatory Visit
Admission: RE | Admit: 2023-10-30 | Discharge: 2023-10-30 | Disposition: A | Discharge status: Home / Self Care | Source: Ambulatory Visit | Attending: Nurse Practitioner | Admitting: Nurse Practitioner

## 2023-10-30 DIAGNOSIS — R928 Other abnormal and inconclusive findings on diagnostic imaging of breast: Secondary | ICD-10-CM | POA: Insufficient documentation

## 2023-10-30 DIAGNOSIS — N63 Unspecified lump in unspecified breast: Secondary | ICD-10-CM | POA: Diagnosis present

## 2023-10-31 ENCOUNTER — Encounter: Attending: Cardiovascular Disease | Admitting: *Deleted

## 2023-10-31 DIAGNOSIS — I5032 Chronic diastolic (congestive) heart failure: Secondary | ICD-10-CM | POA: Insufficient documentation

## 2023-10-31 NOTE — Progress Notes (Signed)
 Daily Session Note  Patient Details  Name: Heather Norman MRN: [id number] Date of Birth: 11/20/1975 Referring Provider:   Flowsheet Row Pulmonary Rehab from 10/22/2023 in [facility] Cardiac and Pulmonary Rehab  Referring Provider Dr. Julien Nordmann, MD       Encounter Date: 10/31/2023  Check In:  Session Check In - 10/31/23 1713       Check-In   Supervising physician immediately available to respond to emergencies See telemetry face sheet for immediately available ER MD    Location [facility]-Cardiac & Pulmonary Rehab    Staff Present Susann Givens RN,BSN;Joseph [facility] Madilyn Fireman BS, ACSM CEP, Exercise Physiologist    Virtual Visit No    Medication changes reported     No    Fall or balance concerns reported    No    Warm-up and Cool-down Performed on first and last piece of equipment    Resistance Training Performed Yes    VAD Patient? No      Pain Assessment   Currently in Pain? No/denies                Social History   Tobacco Use  Smoking Status Never  Smokeless Tobacco Never    Goals Met:  Independence with exercise equipment Exercise tolerated well No report of concerns or symptoms today Strength training completed today  Goals Unmet:  Not Applicable  Comments: Pt able to follow exercise prescription today without complaint.  Will continue to monitor for progression.    Dr. Bethann Punches is Medical Director for [facility] Cardiac Rehabilitation.  Dr. Vida Rigger is Medical Director for [facility] Pulmonary Rehabilitation.

## 2023-11-01 ENCOUNTER — Encounter: Admitting: *Deleted

## 2023-11-01 DIAGNOSIS — I5032 Chronic diastolic (congestive) heart failure: Secondary | ICD-10-CM

## 2023-11-01 NOTE — Progress Notes (Signed)
 Daily Session Note  Patient Details  Name: Heather Norman MRN: [id number] Date of Birth: 03/28/1976 Referring Provider:   Flowsheet Row Pulmonary Rehab from 10/22/2023 in [facility] Cardiac and Pulmonary Rehab  Referring Provider Dr. Julien Nordmann, MD       Encounter Date: 11/01/2023  Check In:  Session Check In - 11/01/23 1718       Check-In   Supervising physician immediately available to respond to emergencies See telemetry face sheet for immediately available ER MD    Location [facility]-Cardiac & Pulmonary Rehab    Staff Present Susann Givens RN,BSN;Joseph [facility] BS, Exercise Physiologist    Virtual Visit No    Medication changes reported     No    Fall or balance concerns reported    No    Warm-up and Cool-down Performed on first and last piece of equipment    Resistance Training Performed Yes    VAD Patient? No    PAD/SET Patient? No      Pain Assessment   Currently in Pain? No/denies                Social History   Tobacco Use  Smoking Status Never  Smokeless Tobacco Never    Goals Met:  Independence with exercise equipment Exercise tolerated well No report of concerns or symptoms today Strength training completed today  Goals Unmet:  Not Applicable  Comments: Pt able to follow exercise prescription today without complaint.  Will continue to monitor for progression.    Dr. Bethann Punches is Medical Director for [facility] Cardiac Rehabilitation.  Dr. Vida Rigger is Medical Director for [facility] Pulmonary Rehabilitation.

## 2023-11-02 ENCOUNTER — Other Ambulatory Visit: Payer: Self-pay | Admitting: Nurse Practitioner

## 2023-11-02 DIAGNOSIS — R928 Other abnormal and inconclusive findings on diagnostic imaging of breast: Secondary | ICD-10-CM

## 2023-11-05 ENCOUNTER — Encounter: Admitting: *Deleted

## 2023-11-05 DIAGNOSIS — I5032 Chronic diastolic (congestive) heart failure: Secondary | ICD-10-CM

## 2023-11-05 NOTE — Progress Notes (Signed)
 Daily Session Note  Patient Details  Name: Heather Norman MRN: [id number] Date of Birth: 03/21/1976 Referring Provider:   Flowsheet Row Pulmonary Rehab from 10/22/2023 in [facility] Cardiac and Pulmonary Rehab  Referring Provider Dr. Julien Nordmann, MD       Encounter Date: 11/05/2023  Check In:  Session Check In - 11/05/23 1133       Check-In   Supervising physician immediately available to respond to emergencies See telemetry face sheet for immediately available ER MD    Location [facility]-Cardiac & Pulmonary Rehab    Staff Present Cora Collum, RN, BSN, CCRP;Joseph Hood RCP,RRT,BSRT;Maxon [employer], Exercise Physiologist;Kelly [employer], ACSM CEP, Exercise Physiologist    Virtual Visit No    Medication changes reported     No    Fall or balance concerns reported    No    Warm-up and Cool-down Performed on first and last piece of equipment    Resistance Training Performed Yes    VAD Patient? No    PAD/SET Patient? No      Pain Assessment   Currently in Pain? No/denies                Social History   Tobacco Use  Smoking Status Never  Smokeless Tobacco Never    Goals Met:  Proper associated with RPD/PD & O2 Sat Independence with exercise equipment Exercise tolerated well No report of concerns or symptoms today  Goals Unmet:  Not Applicable  Comments: Pt able to follow exercise prescription today without complaint.  Will continue to monitor for progression.    Dr. Bethann Punches is Medical Director for [facility] Cardiac Rehabilitation.  Dr. Vida Rigger is Medical Director for [facility] Pulmonary Rehabilitation.

## 2023-11-07 ENCOUNTER — Encounter: Payer: Self-pay | Admitting: Obstetrics

## 2023-11-07 ENCOUNTER — Encounter: Admitting: *Deleted

## 2023-11-07 ENCOUNTER — Ambulatory Visit (INDEPENDENT_AMBULATORY_CARE_PROVIDER_SITE_OTHER): Admitting: Obstetrics

## 2023-11-07 ENCOUNTER — Telehealth: Payer: Self-pay | Admitting: Family

## 2023-11-07 ENCOUNTER — Encounter: Payer: Self-pay | Admitting: *Deleted

## 2023-11-07 VITALS — BP 118/61 | HR 53 | Ht 65.0 in | Wt 290.0 lb

## 2023-11-07 DIAGNOSIS — I5032 Chronic diastolic (congestive) heart failure: Secondary | ICD-10-CM | POA: Diagnosis not present

## 2023-11-07 DIAGNOSIS — E282 Polycystic ovarian syndrome: Secondary | ICD-10-CM | POA: Insufficient documentation

## 2023-11-07 DIAGNOSIS — D251 Intramural leiomyoma of uterus: Secondary | ICD-10-CM | POA: Diagnosis not present

## 2023-11-07 DIAGNOSIS — N921 Excessive and frequent menstruation with irregular cycle: Secondary | ICD-10-CM | POA: Diagnosis not present

## 2023-11-07 NOTE — Progress Notes (Signed)
 Pulmonary Individual Treatment Plan  Patient Details  Name: Heather Norman MRN: [id number] Date of Birth: 07-15-1976 Referring Provider:   Flowsheet Row Pulmonary Rehab from 10/22/2023 in [facility] Cardiac and Pulmonary Rehab  Referring Provider Dr. Julien Nordmann, MD       Initial Encounter Date:  Flowsheet Row Pulmonary Rehab from 10/22/2023 in [facility] Cardiac and Pulmonary Rehab  Date 10/22/23       Visit Diagnosis: Heart failure, diastolic, chronic (HCC)  Patient's Home Medications on Admission:  Current Outpatient Medications:    albuterol (VENTOLIN HFA) 108 (90 Base) MCG/ACT inhaler, Inhale 1-2 puffs into the lungs every 6 (six) hours as needed., Disp: 18 g, Rfl: 0   aluminum-magnesium hydroxide 200-200 MG/5ML suspension, Take 15 mLs by mouth every 6 (six) hours as needed for indigestion., Disp: , Rfl:    aspirin EC 81 MG tablet, Take 81 mg by mouth daily., Disp: , Rfl:    atorvastatin (LIPITOR) 20 MG tablet, TAKE 1 TABLET(20 MG) BY MOUTH DAILY, Disp: 90 tablet, Rfl: 3   carvedilol (COREG) 25 MG tablet, Take 0.5 tablets (12.5 mg total) by mouth 2 (two) times daily with a meal., Disp: 225 tablet, Rfl: 3   cetirizine (ZYRTEC) 10 MG tablet, Take 10 mg by mouth daily., Disp: , Rfl:    empagliflozin (JARDIANCE) 25 MG TABS tablet, Take 1 tablet (25 mg total) by mouth daily before breakfast., Disp: 30 tablet, Rfl: 6   famotidine (PEPCID) 20 MG tablet, Take 1 tablet (20 mg total) by mouth 2 (two) times daily., Disp: , Rfl:    famotidine-calcium carbonate-magnesium hydroxide (PEPCID COMPLETE) 10-800-165 MG chewable tablet, Chew 1 tablet by mouth daily as needed., Disp: , Rfl:    fluticasone (FLONASE) 50 MCG/ACT nasal spray, Place 2 sprays into both nostrils daily., Disp: , Rfl:    latanoprost (XALATAN) 0.005 % ophthalmic solution, Place 1 drop into both eyes at bedtime., Disp: , Rfl:    meclizine (ANTIVERT) 25 MG tablet, Take 1 tablet (25 mg total) by mouth 3 (three) times daily as needed for  dizziness or nausea., Disp: 30 tablet, Rfl: 1   metolazone (ZAROXOLYN) 2.5 MG tablet, Take 1 tablet (2.5 mg total) by mouth once a week., Disp: , Rfl:    mexiletine (MEXITIL) 150 MG capsule, Take 2 capsules (300 mg total) by mouth 2 (two) times daily., Disp: 360 capsule, Rfl: 3   pantoprazole (PROTONIX) 40 MG tablet, Take 1 tablet (40 mg total) by mouth daily., Disp: 90 tablet, Rfl: 3   potassium chloride 20 MEQ/15ML (10%) SOLN, Take 60 mLs (80 mEq total) by mouth daily. And additional ( ) on the days you take the metolazone, Disp: 473 mL, Rfl: 6   sacubitril-valsartan (ENTRESTO) 49-51 MG, Take 1 tablet by mouth 2 (two) times daily., Disp: 60 tablet, Rfl: 3   Semaglutide,0.25 or 0.5MG /DOS, 2 MG/3ML SOPN, Inject 0.25 mg into the skin once a week., Disp: 3 mL, Rfl: 5   spironolactone (ALDACTONE) 25 MG tablet, TAKE 1 TABLET(25 MG) BY MOUTH DAILY, Disp: 90 tablet, Rfl: 3   torsemide (DEMADEX) 20 MG tablet, Take 4 tablets (80 mg total) by mouth daily. (Patient taking differently: Take 40 mg by mouth 2 (two) times daily. Take 60MG  in the morning and 40 MG at night), Disp: 180 tablet, Rfl: 6  Past Medical History: Past Medical History:  Diagnosis Date   (HFpEF) heart failure with preserved ejection fraction (HCC)    a. 04/2017 Echo: EF 55-60%, no rwma, Mild MR, mildly dil LA.  Nl RV fxn; b. 06/2019 Echo: EF 60-65%, mod LVH. Gr2 DD. Mildly dil LA.   Arthritis    kness/hands - no meds   CHF (congestive heart failure) (HCC)    Diabetes mellitus without complication (HCC)    DM (diabetes mellitus) (HCC) 05/11/2017   Dyspnea on exertion    Headache(784.0)    otc med prn   Heart murmur    Hyperlipidemia    no meds since pregnancy   Hypertension    Morbid obesity (HCC)     Tobacco Use: Social History   Tobacco Use  Smoking Status Never  Smokeless Tobacco Never    Labs: Review Flowsheet  More data may exist      Latest Ref Rng & Units 12/13/2012 01/14/2015 06/05/2020 03/28/2023  08/22/2023  Labs for ITP Cardiac and Pulmonary Rehab  Cholestrol 0 - 200 mg/dL 161  096  045  - -  LDL (calc) 0 - 99 mg/dL 409  811  914  - -  HDL-C >40 mg/dL 52  42  48  - -  Trlycerides <150 mg/dL 84  782  956  - -  Hemoglobin A1c 4.8 - 5.6 % 6.6  - - - 8.5   PH, Arterial 7.35 - 7.45 - - - 7.372  -  PCO2 arterial 32 - 48 mmHg - - - 38.3  -  Bicarbonate 20.0 - 28.0 mmol/L - - - 18.9  24.8  22.2  -  TCO2 22 - 32 mmol/L - - - 20  26  23   -  Acid-base deficit 0.0 - 2.0 mmol/L - - - 7.0  1.0  3.0  -  O2 Saturation % - - - 71  73  96  -    Details       Multiple values from one day are sorted in reverse-chronological order          Pulmonary Assessment Scores:  Pulmonary Assessment Scores     Row Name 10/22/23 1532         ADL UCSD   ADL Phase Entry     SOB Score total 20     Rest 0     Walk 0     Stairs 4     Bath 0     Dress 0     Shop 0       CAT Score   CAT Score 20       mMRC Score   mMRC Score 2              UCSD: Self-administered rating of dyspnea associated with activities of daily living (ADLs) 6-point scale (0 = "not at all" to 5 = "maximal or unable to do because of breathlessness")  Scoring Scores range from 0 to 120.  Minimally important difference is 5 units  CAT: CAT can identify the health impairment of COPD patients and is better correlated with disease progression.  CAT has a scoring range of zero to 40. The CAT score is classified into four groups of low (less than 10), medium (10 - 20), high (21-30) and very high (31-40) based on the impact level of disease on health status. A CAT score over 10 suggests significant symptoms.  A worsening CAT score could be explained by an exacerbation, poor medication adherence, poor inhaler technique, or progression of COPD or comorbid conditions.  CAT MCID is 2 points  mMRC: mMRC (Modified Medical Research Council) Dyspnea Scale is used to assess the degree of baseline  functional disability in patients  of respiratory disease due to dyspnea. No minimal important difference is established. A decrease in score of 1 point or greater is considered a positive change.   Pulmonary Function Assessment:   Exercise Target Goals: Exercise Program Goal: Individual exercise prescription set using results from initial 6 min walk test and THRR while considering  patient's activity barriers and safety.   Exercise Prescription Goal: Initial exercise prescription builds to 30-45 minutes a day of aerobic activity, 2-3 days per week.  Home exercise guidelines will be given to patient during program as part of exercise prescription that the participant will acknowledge.  Education: Aerobic Exercise: - Group verbal and visual presentation on the components of exercise prescription. Introduces F.I.T.T principle from ACSM for exercise prescriptions.  Reviews F.I.T.T. principles of aerobic exercise including progression. Written material given at graduation. Flowsheet Row Pulmonary Rehab from 10/31/2023 in [facility] Cardiac and Pulmonary Rehab  Education need identified 10/22/23       Education: Resistance Exercise: - Group verbal and visual presentation on the components of exercise prescription. Introduces F.I.T.T principle from ACSM for exercise prescriptions  Reviews F.I.T.T. principles of resistance exercise including progression. Written material given at graduation.    Education: Exercise & Equipment Safety: - Individual verbal instruction and demonstration of equipment use and safety with use of the equipment. Flowsheet Row Pulmonary Rehab from 10/31/2023 in [facility] Cardiac and Pulmonary Rehab  Date 10/22/23  Educator NT  Instruction Review Code 1- Verbalizes Understanding       Education: Exercise Physiology & General Exercise Guidelines: - Group verbal and written instruction with models to review the exercise physiology of the cardiovascular system and associated critical values. Provides general exercise  guidelines with specific guidelines to those with heart or lung disease.    Education: Flexibility, Balance, Mind/Body Relaxation: - Group verbal and visual presentation with interactive activity on the components of exercise prescription. Introduces F.I.T.T principle from ACSM for exercise prescriptions. Reviews F.I.T.T. principles of flexibility and balance exercise training including progression. Also discusses the mind body connection.  Reviews various relaxation techniques to help reduce and manage stress (i.e. Deep breathing, progressive muscle relaxation, and visualization). Balance handout provided to take home. Written material given at graduation.   Activity Barriers & Risk Stratification:  Activity Barriers & Cardiac Risk Stratification - 10/22/23 1536       Activity Barriers & Cardiac Risk Stratification   Activity Barriers Shortness of Breath;Balance Concerns   Vertigo            6 Minute Walk:  6 Minute Walk     Row Name 10/22/23 1534         6 Minute Walk   Phase Initial     Distance 940 feet     Walk Time 6 minutes     # of Rest Breaks 0     MPH 1.78     METS 2.46     RPE 11     Perceived Dyspnea  0     VO2 Peak 8.6     Symptoms Yes (comment)     Comments burning in legs     Resting HR 58 bpm     Resting BP 112/72     Resting Oxygen Saturation  94 %     Exercise Oxygen Saturation  during 6 min walk 93 %     Max Ex. HR 119 bpm     Max Ex. BP 124/76     2 Minute Post BP 106/70  Interval HR   1 Minute HR 111     2 Minute HR 101     3 Minute HR 108     4 Minute HR 84     5 Minute HR 108     6 Minute HR 119     2 Minute Post HR 78     Interval Heart Rate? Yes       Interval Oxygen   Interval Oxygen? Yes     Baseline Oxygen Saturation % 94 %     1 Minute Oxygen Saturation % 93 %     1 Minute Liters of Oxygen 0 L  RA     2 Minute Oxygen Saturation % 94 %     2 Minute Liters of Oxygen 0 L     3 Minute Oxygen Saturation % 93 %     3 Minute  Liters of Oxygen 0 L     4 Minute Oxygen Saturation % 95 %     4 Minute Liters of Oxygen 0 L     5 Minute Oxygen Saturation % 94 %     5 Minute Liters of Oxygen 0 L     6 Minute Oxygen Saturation % 94 %     6 Minute Liters of Oxygen 0 L     2 Minute Post Oxygen Saturation % 95 %     2 Minute Post Liters of Oxygen 0 L             Oxygen Initial Assessment:  Oxygen Initial Assessment - 10/11/23 1251       Home Oxygen   Home Oxygen Device None    Sleep Oxygen Prescription None    Home Exercise Oxygen Prescription None    Home Resting Oxygen Prescription None    Compliance with Home Oxygen Use Yes      Intervention   Short Term Goals To learn and demonstrate proper use of respiratory medications;To learn and demonstrate proper pursed lip breathing techniques or other breathing techniques.     Long  Term Goals Demonstrates proper use of MDI's;Compliance with respiratory medication;Exhibits proper breathing techniques, such as pursed lip breathing or other method taught during program session             Oxygen Re-Evaluation:  Oxygen Re-Evaluation     Row Name 10/24/23 1716             Goals/Expected Outcomes   Short Term Goals To learn and demonstrate proper use of respiratory medications;To learn and demonstrate proper pursed lip breathing techniques or other breathing techniques.        Long  Term Goals Demonstrates proper use of MDI's;Compliance with respiratory medication;Exhibits proper breathing techniques, such as pursed lip breathing or other method taught during program session       Comments Reviewed PLB technique with pt.  Talked about how it works and it's importance in maintaining their exercise saturations.       Goals/Expected Outcomes Short: Become more profiecient at using PLB.   Long: Become independent at using PLB.                Oxygen Discharge (Final Oxygen Re-Evaluation):  Oxygen Re-Evaluation - 10/24/23 1716       Goals/Expected  Outcomes   Short Term Goals To learn and demonstrate proper use of respiratory medications;To learn and demonstrate proper pursed lip breathing techniques or other breathing techniques.     Long  Term Goals Demonstrates proper use of MDI's;Compliance  with respiratory medication;Exhibits proper breathing techniques, such as pursed lip breathing or other method taught during program session    Comments Reviewed PLB technique with pt.  Talked about how it works and it's importance in maintaining their exercise saturations.    Goals/Expected Outcomes Short: Become more profiecient at using PLB.   Long: Become independent at using PLB.             Initial Exercise Prescription:  Initial Exercise Prescription - 10/22/23 1500       Date of Initial Exercise RX and Referring Provider   Date 10/22/23    Referring Provider Dr. Julien Nordmann, MD      Oxygen   Maintain Oxygen Saturation 88% or higher      Treadmill   MPH 1.8    Grade 0.5    Minutes 15    METs 2.5      Recumbant Bike   Level 1    RPM 50    Watts 19    Minutes 15    METs 2.46      NuStep   Level 2    SPM 80    Minutes 15    METs 2.46      Prescription Details   Frequency (times per week) 3    Duration Progress to 30 minutes of continuous aerobic without signs/symptoms of physical distress      Intensity   THRR 40-80% of Max Heartrate 103-148    Ratings of Perceived Exertion 11-13    Perceived Dyspnea 0-4      Progression   Progression Continue to progress workloads to maintain intensity without signs/symptoms of physical distress.      Resistance Training   Training Prescription Yes    Weight 3 lb    Reps 10-15             Perform Capillary Blood Glucose checks as needed.  Exercise Prescription Changes:   Exercise Prescription Changes     Row Name 10/22/23 1500 11/01/23 1100           Response to Exercise   Blood Pressure (Admit) 112/72 122/64      Blood Pressure (Exercise) 124/76  150/68      Blood Pressure (Exit) 106/70 110/70      Heart Rate (Admit) 58 bpm 77 bpm      Heart Rate (Exercise) 119 bpm 108 bpm      Heart Rate (Exit) 78 bpm 88 bpm      Oxygen Saturation (Admit) 94 % 96 %      Oxygen Saturation (Exercise) 93 % 95 %      Oxygen Saturation (Exit) 95 % 95 %      Rating of Perceived Exertion (Exercise) 11 12      Perceived Dyspnea (Exercise) 0 1      Symptoms burning in legs none      Comments Results First two days of exercise      Duration -- Continue with 30 min of aerobic exercise without signs/symptoms of physical distress.      Intensity -- THRR unchanged        Progression   Progression -- Continue to progress workloads to maintain intensity without signs/symptoms of physical distress.      Average METs -- 2.03        Resistance Training   Training Prescription -- Yes      Weight -- 3 lb      Reps -- 10-15  Interval Training   Interval Training -- No        Treadmill   MPH -- 1.3      Grade -- 0.5      Minutes -- 15      METs -- 2.08        NuStep   Level -- 3      Minutes -- 15      METs -- 2        Oxygen   Maintain Oxygen Saturation -- 88% or higher               Exercise Comments:   Exercise Comments     Row Name 10/24/23 1715           Exercise Comments First full day of exercise!  Patient was oriented to gym and equipment including functions, settings, policies, and procedures.  Patient's individual exercise prescription and treatment plan were reviewed.  All starting workloads were established based on the results of the 6 minute walk test done at initial orientation visit.  The plan for exercise progression was also introduced and progression will be customized based on patient's performance and goals.                Exercise Goals and Review:   Exercise Goals     Row Name 10/22/23 1537             Exercise Goals   Increase Physical Activity Yes       Intervention Provide advice,  education, support and counseling about physical activity/exercise needs.;Develop an individualized exercise prescription for aerobic and resistive training based on initial evaluation findings, risk stratification, comorbidities and participant's personal goals.       Expected Outcomes Short Term: Attend rehab on a regular basis to increase amount of physical activity.;Long Term: Exercising regularly at least 3-5 days a week.;Long Term: Add in home exercise to make exercise part of routine and to increase amount of physical activity.       Increase Strength and Stamina Yes       Intervention Provide advice, education, support and counseling about physical activity/exercise needs.;Develop an individualized exercise prescription for aerobic and resistive training based on initial evaluation findings, risk stratification, comorbidities and participant's personal goals.       Expected Outcomes Short Term: Increase workloads from initial exercise prescription for resistance, speed, and METs.;Short Term: Perform resistance training exercises routinely during rehab and add in resistance training at home;Long Term: Improve cardiorespiratory fitness, muscular endurance and strength as measured by increased METs and functional capacity ( )       Able to understand and use rate of perceived exertion (RPE) scale Yes       Intervention Provide education and explanation on how to use RPE scale       Expected Outcomes Short Term: Able to use RPE daily in rehab to express subjective intensity level;Long Term:  Able to use RPE to guide intensity level when exercising independently       Able to understand and use Dyspnea scale Yes       Intervention Provide education and explanation on how to use Dyspnea scale       Expected Outcomes Short Term: Able to use Dyspnea scale daily in rehab to express subjective sense of shortness of breath during exertion;Long Term: Able to use Dyspnea scale to guide intensity level when  exercising independently       Knowledge and understanding of Target Heart Rate Range (THRR)  Yes       Intervention Provide education and explanation of THRR including how the numbers were predicted and where they are located for reference       Expected Outcomes Long Term: Able to use THRR to govern intensity when exercising independently;Short Term: Able to state/look up THRR;Short Term: Able to use daily as guideline for intensity in rehab       Able to check pulse independently Yes       Intervention Provide education and demonstration on how to check pulse in carotid and radial arteries.;Review the importance of being able to check your own pulse for safety during independent exercise       Expected Outcomes Short Term: Able to explain why pulse checking is important during independent exercise;Long Term: Able to check pulse independently and accurately       Understanding of Exercise Prescription Yes       Intervention Provide education, explanation, and written materials on patient's individual exercise prescription       Expected Outcomes Short Term: Able to explain program exercise prescription;Long Term: Able to explain home exercise prescription to exercise independently                Exercise Goals Re-Evaluation :  Exercise Goals Re-Evaluation     Row Name 10/24/23 1715 11/01/23 1128           Exercise Goal Re-Evaluation   Exercise Goals Review Increase Physical Activity;Able to understand and use rate of perceived exertion (RPE) scale;Knowledge and understanding of Target Heart Rate Range (THRR);Understanding of Exercise Prescription;Increase Strength and Stamina;Able to understand and use Dyspnea scale;Able to check pulse independently Increase Physical Activity;Increase Strength and Stamina;Understanding of Exercise Prescription      Comments Reviewed RPE and dyspnea scale, THR and program prescription with pt today.  Pt voiced understanding and was given a copy of goals to  take home. Anayia is off to a good start in the program. She did well on the treadmill at a speed of 1.3 mph with an incline of 0.5% during her first two sessions of rehab. She also did well on the T4 nustep at level 3. We will continue to monitor her progress in the program.      Expected Outcomes Short: Use RPE daily to regulate intensity.  Long: Follow program prescription in THR. Short: Continue to follow current exercise prescription. Long: Continue exercise to improve strength and stamina.               Discharge Exercise Prescription (Final Exercise Prescription Changes):  Exercise Prescription Changes - 11/01/23 1100       Response to Exercise   Blood Pressure (Admit) 122/64    Blood Pressure (Exercise) 150/68    Blood Pressure (Exit) 110/70    Heart Rate (Admit) 77 bpm    Heart Rate (Exercise) 108 bpm    Heart Rate (Exit) 88 bpm    Oxygen Saturation (Admit) 96 %    Oxygen Saturation (Exercise) 95 %    Oxygen Saturation (Exit) 95 %    Rating of Perceived Exertion (Exercise) 12    Perceived Dyspnea (Exercise) 1    Symptoms none    Comments First two days of exercise    Duration Continue with 30 min of aerobic exercise without signs/symptoms of physical distress.    Intensity THRR unchanged      Progression   Progression Continue to progress workloads to maintain intensity without signs/symptoms of physical distress.    Average METs  2.03      Resistance Training   Training Prescription Yes    Weight 3 lb    Reps 10-15      Interval Training   Interval Training No      Treadmill   MPH 1.3    Grade 0.5    Minutes 15    METs 2.08      NuStep   Level 3    Minutes 15    METs 2      Oxygen   Maintain Oxygen Saturation 88% or higher             Nutrition:  Target Goals: Understanding of nutrition guidelines, daily intake of sodium 1500mg , cholesterol 200mg , calories 30% from fat and 7% or less from saturated fats, daily to have 5 or more servings of  fruits and vegetables.  Education: All About Nutrition: -Group instruction provided by verbal, written material, interactive activities, discussions, models, and posters to present general guidelines for heart healthy nutrition including fat, fiber, MyPlate, the role of sodium in heart healthy nutrition, utilization of the nutrition label, and utilization of this knowledge for meal planning. Follow up email sent as well. Written material given at graduation. Flowsheet Row Pulmonary Rehab from 10/31/2023 in [facility] Cardiac and Pulmonary Rehab  Education need identified 10/22/23  Date 10/31/23  [part 2]  Educator Ottis Stain  Instruction Review Code 1- Verbalizes Understanding       Biometrics:  Pre Biometrics - 10/22/23 1537       Pre Biometrics   Height 5' 6.5" (1.689 m)    Weight 290 lb 14.4 oz (132 kg)    Waist Circumference 50.5 inches    Hip Circumference 55 inches    Waist to Hip Ratio 0.92 %    BMI (Calculated) 46.25    Single Leg Stand 3.45 seconds              Nutrition Therapy Plan and Nutrition Goals:  Nutrition Therapy & Goals - 10/22/23 1449       Nutrition Therapy   Protein (specify units) 70-90    Fiber 25 grams    Whole Grain Foods 3 servings    Saturated Fats 15 max. grams    Fruits and Vegetables 5 servings/day    Sodium 2 grams      Personal Nutrition Goals   Nutrition Goal Eat 15-30gProtein and 30-60gCarbs at each meal.    Personal Goal #2 Read labels and reduce sodium intake to below 2300mg . Ideally 1500mg  per day.    Personal Goal #3 Include more colorful produce, aim for 5-8 servings of fruits and veggies per day    Comments Patient drinking less sugary beverages. Has been trying to better control her DM. Discussed some of her meals. Breakfast being oatmeal with cranberries and raisin with a fruit smoothie. Together walked through her breakfast highlighting  her high carb intake and lack of protein. Provided handout on pairing carbs with protein. Educated on  carb sources and set goal of eating ~30-60g per meal. Reviewed mediterranean diet handout and educated on types of fats sources and how to read facts labels. She would like to meal prep more and include more veggies at meals.      Intervention Plan   Intervention Nutrition handout(s) given to patient.;Prescribe, educate and counsel regarding individualized specific dietary modifications aiming towards targeted core components such as weight, hypertension, lipid management, diabetes, heart failure and other comorbidities.    Expected Outcomes Long Term Goal: Adherence to prescribed nutrition  plan.;Short Term Goal: A plan has been developed with personal nutrition goals set during dietitian appointment.;Short Term Goal: Understand basic principles of dietary content, such as calories, fat, sodium, cholesterol and nutrients.             Nutrition Assessments:  MEDIFICTS Score Key: >=70 Need to make dietary changes  40-70 Heart Healthy Diet <= 40 Therapeutic Level Cholesterol Diet  Flowsheet Row Pulmonary Rehab [facility]22/2023 in [facility] [facility] Cardiac and Pulmonary Rehab  Picture Your Plate Total Score on Admission 64      Picture Your Plate Scores: <78 Unhealthy dietary pattern with much room for improvement. 41-50 Dietary pattern unlikely to meet recommendations for good health and room for improvement. 51-60 More healthful dietary pattern, with some room for improvement.  >60 Healthy dietary pattern, although there may be some specific behaviors that could be improved.   Nutrition Goals Re-Evaluation:   Nutrition Goals Discharge (Final Nutrition Goals Re-Evaluation):   Psychosocial: Target Goals: Acknowledge presence or absence of significant depression and/or stress, maximize coping skills, provide positive support system. Participant is able to verbalize types and ability to use techniques and skills needed for reducing stress and depression.   Education: Stress, Anxiety, and  Depression - Group verbal and visual presentation to define topics covered.  Reviews how body is impacted by stress, anxiety, and depression.  Also discusses healthy ways to reduce stress and to treat/manage anxiety and depression.  Written material given at graduation.   Education: Sleep Hygiene -Provides group verbal and written instruction about how sleep can affect your health.  Define sleep hygiene, discuss sleep cycles and impact of sleep habits. Review good sleep hygiene tips.    Initial Review & Psychosocial Screening:  Initial Psych Review & Screening - 10/11/23 1256       Initial Review   Current issues with Current Stress Concerns    Source of Stress Concerns Chronic Illness    Comments depression comes and goes does not last   seeing a therapist.      Family Dynamics   Good Support System? Yes   family, Mom     Barriers   Psychosocial barriers to participate in program There are no identifiable barriers or psychosocial needs.      Screening Interventions   Interventions To provide support and resources with identified psychosocial needs;Provide feedback about the scores to participant    Expected Outcomes Short Term goal: Utilizing psychosocial counselor, staff and physician to assist with identification of specific Stressors or current issues interfering with healing process. Setting desired goal for each stressor or current issue identified.;Long Term Goal: Stressors or current issues are controlled or eliminated.;Short Term goal: Identification and review with participant of any Quality of Life or Depression concerns found by scoring the questionnaire.;Long Term goal: The participant improves quality of Life and PHQ9 Scores as seen by post scores and/or verbalization of changes             Quality of Life Scores:  Scores of 19 and below usually indicate a poorer quality of life in these areas.  A difference of  2-3 points is a clinically meaningful difference.  A  difference of 2-3 points in the total score of the Quality of Life Index has been associated with significant improvement in overall quality of life, self-image, physical symptoms, and general health in studies assessing change in quality of life.  PHQ-9: Review Flowsheet  More data exists      10/22/2023 03/14/2022 09/16/2021 04/22/2018 03/18/2018  Depression  screen PHQ 2/9  Decreased Interest 1 0 1 0 0  Down, Depressed, Hopeless 1 0 1 0 0  PHQ - 2 Score 2 0 2 0 0  Altered sleeping 0 - 2 - -  Tired, decreased energy 0 - 1 - -  Change in appetite 0 - 1 - -  Feeling bad or failure about yourself  1 - 0 - -  Trouble concentrating 0 - 1 - -  Moving slowly or fidgety/restless 0 - 0 - -  Suicidal thoughts 0 - 0 - -  PHQ-9 Score 3 - 7 - -  Difficult doing work/chores Not difficult at all - Somewhat difficult - -   Interpretation of Total Score  Total Score Depression Severity:  1-4 = Minimal depression, 5-9 = Mild depression, 10-14 = Moderate depression, 15-19 = Moderately severe depression, 20-27 = Severe depression   Psychosocial Evaluation and Intervention:  Psychosocial Evaluation - 10/11/23 1306       Psychosocial Evaluation & Interventions   Interventions Encouraged to exercise with the program and follow exercise prescription    Comments There are no barriers to starting the program.  She has support from her family, mostly her Mom.  She does see a therapist, on no meds at this time.  Recognizes that she has has had depression at times and it does not last long.  She is ready to learn about managing her weight and improving her lifestyle.    Expected Outcomes STG attend all scheduled sessions, meet with RD  for diabetes and weight loss guidance, and follow the guidance for  exercise progression   LTG Able to maintain her exercise progression, continues to work on her weight loss goals and managing her diabetes    Continue Psychosocial Services  Follow up required by staff              Psychosocial Re-Evaluation:   Psychosocial Discharge (Final Psychosocial Re-Evaluation):   Education: Education Goals: Education classes will be provided on a weekly basis, covering required topics. Participant will state understanding/return demonstration of topics presented.  Learning Barriers/Preferences:  Learning Barriers/Preferences - 10/11/23 1259       Learning Barriers/Preferences   Learning Barriers Hearing   deaf in left ear,   Learning Preferences None             General Pulmonary Education Topics:  Infection Prevention: - Provides verbal and written material to individual with discussion of infection control including proper hand washing and proper equipment cleaning during exercise session. Flowsheet Row Pulmonary Rehab from 10/31/2023 in [facility] Cardiac and Pulmonary Rehab  Date 10/22/23  Educator NT  Instruction Review Code 1- Verbalizes Understanding       Falls Prevention: - Provides verbal and written material to individual with discussion of falls prevention and safety. Flowsheet Row Pulmonary Rehab from 10/31/2023 in [facility] Cardiac and Pulmonary Rehab  Date 10/11/23  Educator SB  Instruction Review Code 1- Verbalizes Understanding       Chronic Lung Disease Review: - Group verbal instruction with posters, models, PowerPoint presentations and videos,  to review new updates, new respiratory medications, new advancements in procedures and treatments. Providing information on websites and "800" numbers for continued self-education. Includes information about supplement oxygen, available portable oxygen systems, continuous and intermittent flow rates, oxygen safety, concentrators, and Medicare reimbursement for oxygen. Explanation of Pulmonary Drugs, including class, frequency, complications, importance of spacers, rinsing mouth after steroid MDI's, and proper cleaning methods for nebulizers. Review of basic lung anatomy and  physiology related to function,  structure, and complications of lung disease. Review of risk factors. Discussion about methods for diagnosing sleep apnea and types of masks and machines for OSA. Includes a review of the use of types of environmental controls: home humidity, furnaces, filters, dust mite/pet prevention, HEPA vacuums. Discussion about weather changes, air quality and the benefits of nasal washing. Instruction on Warning signs, infection symptoms, calling MD promptly, preventive modes, and value of vaccinations. Review of effective airway clearance, coughing and/or vibration techniques. Emphasizing that all should Create an Action Plan. Written material given at graduation.   AED/CPR: - Group verbal and written instruction with the use of models to demonstrate the basic use of the AED with the basic ABC's of resuscitation.    Anatomy and Cardiac Procedures: - Group verbal and visual presentation and models provide information about basic cardiac anatomy and function. Reviews the testing methods done to diagnose heart disease and the outcomes of the test results. Describes the treatment choices: Medical Management, Angioplasty, or Coronary Bypass Surgery for treating various heart conditions including Myocardial Infarction, Angina, Valve Disease, and Cardiac Arrhythmias.  Written material given at graduation. Flowsheet Row Pulmonary Rehab from 10/31/2023 in [facility] Cardiac and Pulmonary Rehab  Education need identified 10/22/23       Medication Safety: - Group verbal and visual instruction to review commonly prescribed medications for heart and lung disease. Reviews the medication, class of the drug, and side effects. Includes the steps to properly store meds and maintain the prescription regimen.  Written material given at graduation.   Other: -Provides group and verbal instruction on various topics (see comments)   Knowledge Questionnaire Score:  Knowledge Questionnaire Score - 10/22/23 1533       Knowledge  Questionnaire Score   Pre Score 23/26              Core Components/Risk Factors/Patient Goals at Admission:  Personal Goals and Risk Factors at Admission - 10/11/23 1301       Core Components/Risk Factors/Patient Goals on Admission    Weight Management Yes    Intervention Weight Management: Develop a combined nutrition and exercise program designed to reach desired caloric intake, while maintaining appropriate intake of nutrient and fiber, sodium and fats, and appropriate energy expenditure required for the weight goal.    Admit Weight 288 lb (130.6 kg)    Goal Weight: Short Term 286 lb (129.7 kg)    Goal Weight: Long Term 239 lb (108.4 kg)    Expected Outcomes Short Term: Continue to assess and modify interventions until short term weight is achieved;Long Term: Adherence to nutrition and physical activity/exercise program aimed toward attainment of established weight goal;Weight Loss: Understanding of general recommendations for a balanced deficit meal plan, which promotes 1-2 lb weight loss per week and includes a negative energy balance of [phone] kcal/d    Improve shortness of breath with ADL's Yes    Intervention Provide education, individualized exercise plan and daily activity instruction to help decrease symptoms of SOB with activities of daily living.    Expected Outcomes Short Term: Improve cardiorespiratory fitness to achieve a reduction of symptoms when performing ADLs;Long Term: Be able to perform more ADLs without symptoms or delay the onset of symptoms    Increase knowledge of respiratory medications and ability to use respiratory devices properly  Yes    Intervention Provide education and demonstration as needed of appropriate use of medications, inhalers, and oxygen therapy.    Expected Outcomes Short Term: Achieves understanding of  medications use. Understands that oxygen is a medication prescribed by physician. Demonstrates appropriate use of inhaler and oxygen  therapy.;Long Term: Maintain appropriate use of medications, inhalers, and oxygen therapy.    Diabetes Yes    Intervention Provide education about signs/symptoms and action to take for hypo/hyperglycemia.;Provide education about proper nutrition, including hydration, and aerobic/resistive exercise prescription along with prescribed medications to achieve blood glucose in normal ranges: Fasting glucose 65-99 mg/dL    Expected Outcomes Short Term: Participant verbalizes understanding of the signs/symptoms and immediate care of hyper/hypoglycemia, proper foot care and importance of medication, aerobic/resistive exercise and nutrition plan for blood glucose control.;Long Term: Attainment of HbA1C < 7%.    Heart Failure Yes    Intervention Provide a combined exercise and nutrition program that is supplemented with education, support and counseling about heart failure. Directed toward relieving symptoms such as shortness of breath, decreased exercise tolerance, and extremity edema.    Expected Outcomes Improve functional capacity of life;Short term: Attendance in program 2-3 days a week with increased exercise capacity. Reported lower sodium intake. Reported increased fruit and vegetable intake. Reports medication compliance.;Short term: Daily weights obtained and reported for increase. Utilizing diuretic protocols set by physician.;Long term: Adoption of self-care skills and reduction of barriers for early signs and symptoms recognition and intervention leading to self-care maintenance.    Hypertension Yes    Intervention Provide education on lifestyle modifcations including regular physical activity/exercise, weight management, moderate sodium restriction and increased consumption of fresh fruit, vegetables, and low fat dairy, alcohol moderation, and smoking cessation.;Monitor prescription use compliance.    Expected Outcomes Short Term: Continued assessment and intervention until BP is < 140/84mm HG in  hypertensive participants. < 130/62mm HG in hypertensive participants with diabetes, heart failure or chronic kidney disease.;Long Term: Maintenance of blood pressure at goal levels.    Lipids Yes    Intervention Provide education and support for participant on nutrition & aerobic/resistive exercise along with prescribed medications to achieve LDL 70mg , HDL >40mg .    Expected Outcomes Short Term: Participant states understanding of desired cholesterol values and is compliant with medications prescribed. Participant is following exercise prescription and nutrition guidelines.;Long Term: Cholesterol controlled with medications as prescribed, with individualized exercise RX and with personalized nutrition plan. Value goals: LDL < 70mg , HDL > 40 mg.             Education:Diabetes - Individual verbal and written instruction to review signs/symptoms of diabetes, desired ranges of glucose level fasting, after meals and with exercise. Acknowledge that pre and post exercise glucose checks will be done for 3 sessions at entry of program. Flowsheet Row Pulmonary Rehab from 10/31/2023 in [facility] Cardiac and Pulmonary Rehab  Date 10/22/23  Educator NT  Instruction Review Code 1- Verbalizes Understanding       Know Your Numbers and Heart Failure: - Group verbal and visual instruction to discuss disease risk factors for cardiac and pulmonary disease and treatment options.  Reviews associated critical values for Overweight/Obesity, Hypertension, Cholesterol, and Diabetes.  Discusses basics of heart failure: signs/symptoms and treatments.  Introduces Heart Failure Zone chart for action plan for heart failure.  Written material given at graduation.   Core Components/Risk Factors/Patient Goals Review:    Core Components/Risk Factors/Patient Goals at Discharge (Final Review):    ITP Comments:  ITP Comments     Row Name 10/11/23 1311 10/22/23 1532 10/24/23 1714 11/07/23 1212     ITP Comments Virtual  orientation call completed today. shehas an appointment on Date: 10/22/2023  for EP eval and gym Orientation.  Documentation of diagnosis can be found in [facility] 08/28/2023 . Completed and gym orientation. Initial ITP created and sent for review to Dr. Jinny Sanders, Medical Director. First full day of exercise!  Patient was oriented to gym and equipment including functions, settings, policies, and procedures.  Patient's individual exercise prescription and treatment plan were reviewed.  All starting workloads were established based on the results of the 6 minute walk test done at initial orientation visit.  The plan for exercise progression was also introduced and progression will be customized based on patient's performance and goals. 30 Day review completed. Medical Director ITP review done, changes made as directed, and signed approval by Medical Director.    new to program             Comments:

## 2023-11-07 NOTE — Progress Notes (Signed)
 GYNECOLOGY PROGRESS NOTE  Subjective:  PCP: Jodi Marble, NP  Patient ID: Heather Norman, female    DOB: 1976-04-15, 48 y.o.   MRN: [id number]  HPI  Patient is a 48 y.o. [id number] female who presents for results follow up from 10/17/23 for AUB and endometrial biopsy. Was unclear if pt had PMB or secondary amenorrhea and resumed menses. Her endometrial biopsy results are normal. Hormones indicate she is pre-menopausal and TVUS done 10/23/23 c/w PCOS and fibroids. Pt states she is currently still bleeding right now, off and on, not constant.   The following portions of the patient's history were reviewed and updated as appropriate: allergies, current medications, past family history, past medical history, past social history, past surgical history, and problem list.  Review of Systems Pertinent items are noted in HPI.   Objective:   Blood pressure 118/61, pulse (!) 53, height 5\' 5"  (1.651 m), weight 290 lb (131.5 kg), last menstrual period 11/03/2023. Body mass index is 48.26 kg/m.  General appearance: alert and cooperative Pelvic: deferred Extremities: extremities normal, atraumatic, no cyanosis or edema Neurologic: Grossly normal  LH 17.7  FSH 8.4   Estradiol 185.0   TSH 2.800   Prolactin 11.9   hCG,Beta Subunit,Qual,Serum Negative   PATHOLOGY FINAL MICROSCOPIC DIAGNOSIS:   A. ENDOMETRIUM, BIOPSY:  - Benign inactive endometrium  - Negative for hyperplasia or malignancy    US PELVIC with TRANSVAGINAL FINDINGS: Uterus   Measurements: 12.6 x 6.5 x 5.8 cm = volume: 25.1 mL. 7.8 x 6.1 x 6.6 cm right anterolateral intramural mass correlate with fibroid.   Endometrium   Thickness: 13 mm.  No focal abnormality visualized.   Right ovary   Measurements: 3.3 x 2.4 x 1.9 cm = volume: 11.8 mL. Simple adnexal cyst of 2.4 x 2.1 x 2.0 cm   No follow-up imaging is recommended.Reference: Radiology 2019 Nov;293(2):359-371   Left ovary   Measurements: 3.3 x 2.5 x 2.4  cm = volume: 10.0 mL. Normal appearance/no adnexal mass.   Pulsed Doppler evaluation demonstrates normal low-resistance arterial and venous waveforms in both ovaries.   Other: Several nabothian cysts the largest 1.8 x 1.8 cm   IMPRESSION: *7.8 x 6.1 x 6.6 cm right anterolateral intramural mass correlate with fibroid. *2.4 x 2.1 x 2.0 cm simple right adnexal cyst. *Several nabothian cysts the largest 1.8 x 1.8 cm.  Assessment/Plan:   1. Intramural leiomyoma of uterus   2. PCOS (polycystic ovarian syndrome)   3. Menorrhagia with irregular cycle    48 y.o. [id number] with AUB, workup indicating L and O. Pt's hormones indicate she is pre-menopausal and US shows ovaries both > 10cc and based on her hx, c/w PCOS, with a R intramural fibroid. We discussed both medical and surgical management today. Pt would like to trial medical therapy, is considering surgical management but unsure if would be safe due to PMH of CHF. She has been told by her cardiologist to avoid estrogen and we would concur with this based on her risk factors; discussed alternative medical therapies and pt would like to also avoid Progestins.  -2wk samples of Orilissa given today, Rx sent for 150mg , coupon card provided; LFTs drawn today. Pt is not sexually active. Discussed dosing and anticipated "menopausal" SE. -Will see her cardiologist soon, pt to discuss clearance for hysterectomy with them, as she would prefer this long-term. -RTC 3 mos for medication follow up, sooner prn   Total time was 35 minutes. That includes chart review before the visit,  the actual patient visit, and time spent on documentation after the visit. Time excludes procedures, if any.    Julieanne Manson, DO [facility]

## 2023-11-07 NOTE — Telephone Encounter (Signed)
 Called to confirm/remind patient of their appointment at [facility] on 11/07/23.   Appointment:   [x] Confirmed  [] Left mess   [] No answer/No voice mail  [] Phone not in service  Patient reminded to bring all medications and/or complete list.  Confirmed patient has transportation. Gave directions, instructed to utilize valet parking.

## 2023-11-07 NOTE — Progress Notes (Signed)
 Daily Session Note  Patient Details  Name: Heather Norman MRN: [id number] Date of Birth: 1976-06-05 Referring Provider:   Flowsheet Row Pulmonary Rehab from 10/22/2023 in [facility] Cardiac and Pulmonary Rehab  Referring Provider Dr. Julien Nordmann, MD       Encounter Date: 11/07/2023  Check In:  Session Check In - 11/07/23 1727       Check-In   Supervising physician immediately available to respond to emergencies See telemetry face sheet for immediately available ER MD    Location [facility]-Cardiac & Pulmonary Rehab    Staff Present Susann Givens RN,BSN;Joseph Hollace Kinnier;Cora Collum, RN, BSN, CCRP    Virtual Visit No    Medication changes reported     No    Fall or balance concerns reported    No    Warm-up and Cool-down Performed on first and last piece of equipment    Resistance Training Performed Yes    VAD Patient? No    PAD/SET Patient? No      Pain Assessment   Currently in Pain? No/denies                Social History   Tobacco Use  Smoking Status Never  Smokeless Tobacco Never    Goals Met:  Independence with exercise equipment Exercise tolerated well No report of concerns or symptoms today Strength training completed today  Goals Unmet:  Not Applicable  Comments: Pt able to follow exercise prescription today without complaint.  Will continue to monitor for progression.    Dr. Bethann Punches is Medical Director for [facility] Cardiac Rehabilitation.  Dr. Vida Rigger is Medical Director for [facility] Pulmonary Rehabilitation.

## 2023-11-08 ENCOUNTER — Ambulatory Visit: Attending: Family | Admitting: Family

## 2023-11-08 ENCOUNTER — Encounter: Payer: Self-pay | Admitting: Obstetrics

## 2023-11-08 ENCOUNTER — Encounter: Payer: Self-pay | Admitting: Family

## 2023-11-08 ENCOUNTER — Encounter: Admitting: *Deleted

## 2023-11-08 VITALS — BP 117/74 | HR 79 | Wt 294.5 lb

## 2023-11-08 DIAGNOSIS — I4891 Unspecified atrial fibrillation: Secondary | ICD-10-CM | POA: Diagnosis not present

## 2023-11-08 DIAGNOSIS — Z5986 Financial insecurity: Secondary | ICD-10-CM | POA: Diagnosis not present

## 2023-11-08 DIAGNOSIS — Z5941 Food insecurity: Secondary | ICD-10-CM | POA: Insufficient documentation

## 2023-11-08 DIAGNOSIS — I110 Hypertensive heart disease with heart failure: Secondary | ICD-10-CM | POA: Insufficient documentation

## 2023-11-08 DIAGNOSIS — I5032 Chronic diastolic (congestive) heart failure: Secondary | ICD-10-CM

## 2023-11-08 DIAGNOSIS — I10 Essential (primary) hypertension: Secondary | ICD-10-CM

## 2023-11-08 DIAGNOSIS — Z7984 Long term (current) use of oral hypoglycemic drugs: Secondary | ICD-10-CM | POA: Diagnosis not present

## 2023-11-08 DIAGNOSIS — G4733 Obstructive sleep apnea (adult) (pediatric): Secondary | ICD-10-CM | POA: Diagnosis not present

## 2023-11-08 DIAGNOSIS — I493 Ventricular premature depolarization: Secondary | ICD-10-CM | POA: Insufficient documentation

## 2023-11-08 DIAGNOSIS — Z7985 Long-term (current) use of injectable non-insulin antidiabetic drugs: Secondary | ICD-10-CM | POA: Insufficient documentation

## 2023-11-08 DIAGNOSIS — D869 Sarcoidosis, unspecified: Secondary | ICD-10-CM | POA: Diagnosis not present

## 2023-11-08 DIAGNOSIS — I472 Ventricular tachycardia, unspecified: Secondary | ICD-10-CM | POA: Diagnosis not present

## 2023-11-08 DIAGNOSIS — Z79899 Other long term (current) drug therapy: Secondary | ICD-10-CM | POA: Insufficient documentation

## 2023-11-08 LAB — HEPATIC FUNCTION PANEL
ALT: 29 IU/L (ref 0–32)
AST: 33 IU/L (ref 0–40)
Albumin: 4.1 g/dL (ref 3.9–4.9)
Alkaline Phosphatase: 91 IU/L (ref 44–121)
Bilirubin Total: 0.4 mg/dL (ref 0.0–1.2)
Bilirubin, Direct: 0.13 mg/dL (ref 0.00–0.40)
Total Protein: 7.1 g/dL (ref 6.0–8.5)

## 2023-11-08 NOTE — Progress Notes (Signed)
 ADVANCED HF CLINIC NOTE   Primary Care: Jodi Marble, NP Primary Cardiologist: Julien Nordmann, MD  Chief Complaint:    HPI:  Ms Wiatrek is a 48 y/o female with a history of arthritis, DM, hyperlipidemia, HTN, morbid obesity and chronic diastolic heart failure.   Zio 10/21 12.5% PVCs Saw EP Lalla Brothers) and suggested possible AAD (flecainide or propafenone) if developed symptoms or LV dysfunction.   Echo 02/10/21 EF 50-55% G1DD   Echo 01/26/22 EF 55-60% mild LVH and trivial MR.  Sleep study 07/24/22: AHI 35.4 Sats down to 75%.  Possible AF during study lasting 4.5 hours. Zio placed    Zio patch 1/24  - Sinus rhythm. No AF - 44 runs NSVT (longest 8 beats)  - 21.3% PVCs (3 morphologies 9.7%, 7.3%, 4.3%)  Saw Dr. Lalla Brothers in 3/24. felt not to be ablation candidate due to obesity. Recommended continue carvedilol as EF stable  Started on CPAP in 2024. Wore CPAP for awhile but then got bronchitis and stopped.   Seen in ER 10/30/22 for CP. Hstrop, ECG and d-Dimer all normal. BNP elevated 47 -> 480 so torsemide increased.  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   Seen in ED on 08/15/23 for volume overload. Got IV lasix. Increased metolazone for 1 to 2 x/week. Unfortunately developed low BP and AKI (Scr 1.5 -> 2.5) and admitted 08/22/23. Given IVF.   Echo 1/25 EF 60-65% G1DD  RV mildly down.   She presents today for a HF follow-up visit with a chief complaint of minimal fatigue. Has associated palpitations. Overall, she says that she feels much better from the last time she was here. She denies shortness of breath, chest pain, cough, abdominal distention, pedal edema, dizziness or difficulty sleeping.   Has upcoming sleep study in 2 weeks. Doing pulmonary rehab 3 days/ week  Has uterine fibroids and says that she may need surgery in the future.   Past Medical History:  Diagnosis Date   (HFpEF) heart failure with  preserved ejection fraction (HCC)    a. 04/2017 Echo: EF 55-60%, no rwma, Mild MR, mildly dil LA. Nl RV fxn; b. 06/2019 Echo: EF 60-65%, mod LVH. Gr2 DD. Mildly dil LA.   Arthritis    kness/hands - no meds   CHF (congestive heart failure) (HCC)    Diabetes mellitus without complication (HCC)    DM (diabetes mellitus) (HCC) 05/11/2017   Dyspnea on exertion    Headache(784.0)    otc med prn   Heart murmur    Hyperlipidemia    no meds since pregnancy   Hypertension    Morbid obesity (HCC)     Current Outpatient Medications  Medication Sig Dispense Refill   albuterol (VENTOLIN HFA) 108 (90 Base) MCG/ACT inhaler Inhale 1-2 puffs into the lungs every 6 (six) hours as needed. 18 g 0   aluminum-magnesium hydroxide 200-200 MG/5ML suspension Take 15 mLs by mouth every 6 (six) hours as needed for indigestion.     aspirin EC 81 MG tablet Take 81 mg by mouth daily.     atorvastatin (LIPITOR) 20 MG tablet TAKE 1 TABLET(20 MG) BY MOUTH DAILY 90 tablet 3   carvedilol (COREG) 25 MG tablet Take 0.5 tablets (12.5 mg total) by mouth 2 (two) times daily with a meal. 225 tablet 3   cetirizine (ZYRTEC) 10 MG tablet Take 10 mg by mouth daily.     empagliflozin (JARDIANCE)  25 MG TABS tablet Take 1 tablet (25 mg total) by mouth daily before breakfast. 30 tablet 6   famotidine (PEPCID) 20 MG tablet Take 1 tablet (20 mg total) by mouth 2 (two) times daily.     famotidine-calcium carbonate-magnesium hydroxide (PEPCID COMPLETE) 10-800-165 MG chewable tablet Chew 1 tablet by mouth daily as needed.     fluticasone (FLONASE) 50 MCG/ACT nasal spray Place 2 sprays into both nostrils daily.     latanoprost (XALATAN) 0.005 % ophthalmic solution Place 1 drop into both eyes at bedtime.     meclizine (ANTIVERT) 25 MG tablet Take 1 tablet (25 mg total) by mouth 3 (three) times daily as needed for dizziness or nausea. 30 tablet 1   metolazone (ZAROXOLYN) 2.5 MG tablet Take 1 tablet (2.5 mg total) by mouth once a week.      mexiletine (MEXITIL) 150 MG capsule Take 2 capsules (300 mg total) by mouth 2 (two) times daily. 360 capsule 3   pantoprazole (PROTONIX) 40 MG tablet Take 1 tablet (40 mg total) by mouth daily. 90 tablet 3   potassium chloride 20 MEQ/15ML (10%) SOLN Take 60 mLs (80 mEq total) by mouth daily. And additional ( ) on the days you take the metolazone 473 mL 6   sacubitril-valsartan (ENTRESTO) 49-51 MG Take 1 tablet by mouth 2 (two) times daily. 60 tablet 3   Semaglutide,0.25 or 0.5MG /DOS, 2 MG/3ML SOPN Inject 0.25 mg into the skin once a week. 3 mL 5   spironolactone (ALDACTONE) 25 MG tablet TAKE 1 TABLET(25 MG) BY MOUTH DAILY 90 tablet 3   torsemide (DEMADEX) 20 MG tablet Take 4 tablets (80 mg total) by mouth daily. (Patient taking differently: Take 40 mg by mouth 2 (two) times daily. Take 60MG  in the morning and 40 MG at night) 180 tablet 6   No current facility-administered medications for this visit.    Allergies  Allergen Reactions   Penicillins Anaphylaxis    Has patient had a PCN reaction causing immediate rash, facial/tongue/throat swelling, SOB or lightheadedness with hypotension: Yes Has patient had a PCN reaction causing severe rash involving mucus membranes or skin necrosis: No Has patient had a PCN reaction that required hospitalization: No Has patient had a PCN reaction occurring within the last 10 years: No If all of the above answers are "NO", then may proceed with Cephalosporin use.      Social History   Socioeconomic History   Marital status: Single    Spouse name: Not on file   Number of children: 3   Years of education: 12   Highest education level: 12th grade  Occupational History   Not on file  Tobacco Use   Smoking status: Never   Smokeless tobacco: Never  Vaping Use   Vaping status: Never Used  Substance and Sexual Activity   Alcohol use: No   Drug use: No   Sexual activity: Yes    Birth control/protection: Surgical  Other Topics Concern   Not  on file  Social History Narrative   Not on file   Social Drivers of Health   Financial Resource Strain: Medium Risk (08/13/2017)   Overall Financial Resource Strain (CARDIA)    Difficulty of Paying Living Expenses: Somewhat hard  Food Insecurity: Food Insecurity Present (08/22/2023)   Hunger Vital Sign    Worried About Running Out of Food in the Last Year: Often true    Ran Out of Food in the Last Year: Sometimes true  Transportation Needs: No Transportation Needs (  08/22/2023)   PRAPARE - Administrator, Civil Service (Medical): No    Lack of Transportation (Non-Medical): No  Physical Activity: Sufficiently Active (06/09/2019)   Exercise Vital Sign    Days of Exercise per Week: 5 days    Minutes of Exercise per Session: 150+ min  Stress: Stress Concern Present (08/13/2017)   [employer] of Occupational Health - Occupational Stress Questionnaire    Feeling of Stress : Rather much  Social Connections: Moderately Isolated (08/13/2017)   Social Connection and Isolation Panel [NHANES]    Frequency of Communication with Friends and Family: More than three times a week    Frequency of Social Gatherings with Friends and Family: Never    Attends Religious Services: Never    Database administrator or Organizations: No    Attends Banker Meetings: Never    Marital Status: Never married  Intimate Partner Violence: Not At Risk (08/22/2023)   Humiliation, Afraid, Rape, and Kick questionnaire    Fear of Current or Ex-Partner: No    Emotionally Abused: No    Physically Abused: No    Sexually Abused: No      Family History  Problem Relation Age of Onset   Diabetes Mother    Hypertension Mother    Hyperlipidemia Mother    Hypertension Father    Breast cancer Maternal Grandmother 77   Vitals:   11/08/23 1153  BP: 117/74  Pulse: 79  SpO2: 100%  Weight: 294 lb 8 oz (133.6 kg)   Wt Readings from Last 3 Encounters:  11/08/23 294 lb 8 oz (133.6 kg)  11/07/23  290 lb (131.5 kg)  10/22/23 290 lb 14.4 oz (132 kg)   Lab Results  Component Value Date   CREATININE 1.83 (H) 10/18/2023   CREATININE 1.93 (H) 10/08/2023   CREATININE 1.18 (H) 08/27/2023    PHYSICAL EXAM:  General: Well appearing. No resp difficulty HEENT: normal Neck: supple, no JVD Cor: Regular rhythm, rate. No rubs, gallops or murmurs Lungs: clear Abdomen: soft, nontender, nondistended. Extremities: no cyanosis, clubbing, rash, edema Neuro: alert & oriented X 3. Moves all 4 extremities w/o difficulty. Affect pleasant   ASSESSMENT & PLAN:   1. 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 HTN - BP 117/74 - BMET 10/18/23 reviewed: sodium 136, potassium 4.4, creatinine 1.83 & GFR 33   3. PVC's, frequent - Zio 10/21 12.5% PVCs Saw EP Lalla Brothers) and suggested possible AAD (flecainide or propafenone) if developed symptoms or LV dysfunction.  - Zio patch 1/24. Sinus No AF. 44 runs NSVT (longest 8 beats). 21.3% PVCs (3 morphologies 9.7%, 7.3%, 4.3%) - suspect driven by OSA but could aslo be infiltrative process (? Sarcoid) -> Check cMRI - Saw Dr. Lalla Brothers 3/24 felt not to be ablation candidate due to obesity. Recommended continue carvedilol as EF stable - PVCs not improve with CPAP - Now on mexilitene 300 bid - Still awaiting cMRI (insurance has not approved yet).    4. Possible AF  -  noted on sleep study and AppleWatch - No AF on Zio -> suspect this is likely PVCs  5. OSA, severe - Sleep study 07/24/22: AHI  35.4 Sats down to 75%.  - now on CPAP; has upcoming repeat sleep study in 2 weeks  6. Morbid obesity - Continue GLP1 - participating in pulmonary rehab   Return in 2 months, sooner if needed.   Delma Freeze, FNP  8:57 AM

## 2023-11-08 NOTE — Progress Notes (Signed)
 Daily Session Note  Patient Details  Name: Heather Norman MRN: [id number] Date of Birth: 10-Oct-1975 Referring Provider:   Flowsheet Row Pulmonary Rehab from 10/22/2023 in [facility]  Referring Provider Dr. Julien Nordmann, MD       Encounter Date: 11/08/2023  Check In:  Session Check In - 11/08/23 1716       Check-In   Supervising physician immediately available to respond to emergencies See telemetry face sheet for immediately available ER MD    Location [facility]-Cardiac & Pulmonary Rehab    Staff Present Susann Givens RN,BSN;[facility] BS, Exercise Physiologist    Virtual Visit No    Medication changes reported     No    Fall or balance concerns reported    No    Warm-up and Cool-down Performed on first and last piece of equipment    Resistance Training Performed Yes    VAD Patient? No    PAD/SET Patient? No      Pain Assessment   Currently in Pain? No/denies                Social History   Tobacco Use  Smoking Status Never  Smokeless Tobacco Never    Goals Met:  Independence with exercise equipment Exercise tolerated well No report of concerns or symptoms today Strength training completed today  Goals Unmet:  Not Applicable  Comments: Pt able to follow exercise prescription today without complaint.  Will continue to monitor for progression.    Dr. Bethann Punches is Medical Director for [facility] Cardiac Rehabilitation.  Dr. Vida Rigger is Medical Director for [facility] Pulmonary Rehabilitation.

## 2023-11-08 NOTE — Patient Instructions (Signed)
 Medication Changes:  No medication changes today!  Follow-Up in: Please follow up with the [facility] in 2 months.  At the [facility], you and your health needs are our priority. We have a designated team specialized in the treatment of Heart Failure. This Care Team includes your primary Heart Failure Specialized Cardiologist (physician), Advanced Practice Providers (APPs- Physician Assistants and Nurse Practitioners), and Pharmacist who all work together to provide you with the care you need, when you need it.   You may see any of the following providers on your designated Care Team at your next follow up:  Dr. Arvilla Meres Dr. Marca Ancona Dr. Dorthula Nettles Dr. Theresia Bough Clarisa Kindred, FNP Enos Fling, RPH-CPP  Please be sure to bring in all your medications bottles to every appointment.   Need to Contact us:  If you have any questions or concerns before your next appointment please send [place] a message through [place] or call our office at [phone].    TO LEAVE A MESSAGE FOR THE NURSE SELECT OPTION 2, PLEASE LEAVE A MESSAGE INCLUDING: YOUR NAME DATE OF BIRTH CALL BACK NUMBER REASON FOR CALL**this is important as we prioritize the call backs  YOU WILL RECEIVE A CALL BACK THE SAME DAY AS LONG AS YOU CALL BEFORE 4:00 PM

## 2023-11-12 ENCOUNTER — Ambulatory Visit
Admission: RE | Admit: 2023-11-12 | Discharge: 2023-11-12 | Disposition: A | Discharge status: Home / Self Care | Source: Ambulatory Visit | Attending: Nurse Practitioner

## 2023-11-12 ENCOUNTER — Encounter

## 2023-11-12 ENCOUNTER — Ambulatory Visit
Admission: RE | Admit: 2023-11-12 | Discharge: 2023-11-12 | Disposition: A | Discharge status: Home / Self Care | Source: Ambulatory Visit | Attending: Nurse Practitioner | Admitting: Nurse Practitioner

## 2023-11-12 DIAGNOSIS — N6011 Diffuse cystic mastopathy of right breast: Secondary | ICD-10-CM | POA: Insufficient documentation

## 2023-11-12 DIAGNOSIS — R928 Other abnormal and inconclusive findings on diagnostic imaging of breast: Secondary | ICD-10-CM | POA: Insufficient documentation

## 2023-11-12 HISTORY — PX: BREAST BIOPSY: SHX20

## 2023-11-12 MED ORDER — LIDOCAINE 1 % OPTIME INJ - NO CHARGE
2.0000 mL | Freq: Once | INTRAMUSCULAR | Status: AC
  Administered 2023-11-12: 2 mL
  Filled 2023-11-12: qty 2

## 2023-11-12 MED ORDER — LIDOCAINE-EPINEPHRINE 1 %-1:100000 IJ SOLN
8.0000 mL | Freq: Once | INTRAMUSCULAR | Status: AC
  Administered 2023-11-12: 8 mL
  Filled 2023-11-12: qty 8

## 2023-11-13 LAB — SURGICAL PATHOLOGY

## 2023-11-14 ENCOUNTER — Encounter

## 2023-11-15 ENCOUNTER — Encounter

## 2023-11-19 ENCOUNTER — Encounter: Admitting: *Deleted

## 2023-11-19 DIAGNOSIS — I5032 Chronic diastolic (congestive) heart failure: Secondary | ICD-10-CM | POA: Diagnosis not present

## 2023-11-19 NOTE — Progress Notes (Signed)
 Daily Session Note  Patient Details  Name: Heather Norman MRN: [id number] Date of Birth: 1975/10/18 Referring Provider:   Flowsheet Row Pulmonary Rehab from 10/22/2023 in [facility] Pul[facility]  Referring Provider Dr. Belva Boyden, MD       Encounter Date: 11/19/2023  Check In:  Session Check In - 11/19/23 1113       Check-In   Supervising physician immediately available to respond to emergencies See telemetry face sheet for immediately available ER MD    Location [facility]-Cardiac & Pulmonary Rehab    Staff Present Maxon Conetta BS, Exercise Physiologist;Kelly Dawne Euler, ACSM CEP, Exercise Physiologist;[employer];Maud Sorenson, RN, BSN, CCRP    Virtual Visit No    Medication changes reported     No    Fall or balance concerns reported    No    Warm-up and Cool-down Performed on first and last piece of equipment    Resistance Training Performed Yes    VAD Patient? No    PAD/SET Patient? No      Pain Assessment   Currently in Pain? No/denies                Social History   Tobacco Use  Smoking Status Never  Smokeless Tobacco Never    Goals Met:  Proper associated with RPD/PD & O2 Sat Independence with exercise equipment Exercise tolerated well No report of concerns or symptoms today  Goals Unmet:  Not Applicable  Comments: Pt able to follow exercise prescription today without complaint.  Will continue to monitor for progression.    Dr. Firman Hughes is Medical Director for [facility] Cardiac Rehabilitation.  Dr. Fuad Aleskerov is Medical Director for [facility] Pulmonary Rehabilitation.

## 2023-11-21 ENCOUNTER — Encounter: Payer: Self-pay | Admitting: Nurse Practitioner

## 2023-11-21 ENCOUNTER — Ambulatory Visit: Attending: Nurse Practitioner | Admitting: Nurse Practitioner

## 2023-11-21 ENCOUNTER — Encounter: Admitting: *Deleted

## 2023-11-21 VITALS — BP 102/70 | HR 82 | Ht 65.0 in | Wt 296.0 lb

## 2023-11-21 DIAGNOSIS — I493 Ventricular premature depolarization: Secondary | ICD-10-CM | POA: Diagnosis not present

## 2023-11-21 DIAGNOSIS — Z79899 Other long term (current) drug therapy: Secondary | ICD-10-CM

## 2023-11-21 DIAGNOSIS — I5032 Chronic diastolic (congestive) heart failure: Secondary | ICD-10-CM

## 2023-11-21 DIAGNOSIS — G4733 Obstructive sleep apnea (adult) (pediatric): Secondary | ICD-10-CM

## 2023-11-21 NOTE — Patient Instructions (Signed)
 Medication Instructions:  No changes *If you need a refill on your cardiac medications before your next appointment, please call your pharmacy*  Lab Work: None ordered If you have labs (blood work) drawn today and your tests are completely normal, you will receive your results only by: MyChart Message (if you have MyChart) OR A paper copy in the mail If you have any lab test that is abnormal or we need to change your treatment, we will call you to review the results.  Testing/Procedures: None ordered  Follow-Up: At [facility], you and your health needs are our priority.  As part of our continuing mission to provide you with exceptional heart care, our providers are all part of one team.  This team includes your primary Cardiologist (physician) and Advanced Practice Providers or APPs (Physician Assistants and Nurse Practitioners) who all work together to provide you with the care you need, when you need it.  Your next appointment:   Follow up as scheduled

## 2023-11-21 NOTE — Progress Notes (Signed)
 Office Visit    Patient Name: Heather Norman Date of Encounter: 11/21/2023  Primary Care Provider:  Evette Hoes, NP Primary Cardiologist:  Belva Boyden, MD  Chief Complaint    48 y.o. female with a history of chronic HFpEF, PVCs, obesity, hypertension, hyperlipidemia, diabetes, obstructive sleep apnea, and arthritis, who presents for heart failure follow-up.  Past Medical History  Subjective   Past Medical History:  Diagnosis Date   (HFpEF) heart failure with preserved ejection fraction (HCC)    a. 04/2017 Echo: EF 55-60%, no rwma, Mild MR, mildly dil LA. Nl RV fxn; b. 06/2019 Echo: EF 60-65%, mod LVH. Gr2 DD. Mildly dil LA; c. 03/2023 Cath: Nl cors. mild PAH (PA 31/17 (26) w/ high output (CO/CI 8.4/3.5); d. 08/2023 Echo: EF 55-60%, no rwma, GrI DD, mildly reduced RV fxn, RVSP 21.6 mmHg. No significant valvular dzs.   Arthritis    kness/hands - no meds   Diabetes mellitus without complication (HCC)    DM (diabetes mellitus) (HCC) 05/11/2017   Dyspnea on exertion    Headache(784.0)    otc med prn   Heart murmur    History of cardiac cath    a. 03/2023 Cath: Nl cors.   Hyperlipidemia    no meds since pregnancy   Hypertension    Morbid obesity (HCC)    NSVT (nonsustained ventricular tachycardia) (HCC)    a. 07/2022 Zio: 44 runs NSVT, longest 8 beats, fastest 182. 3 brief SVT runs.  21.3% PVC burden.   PVC (premature ventricular contraction)    a. 07/2022 Zio: 21.3 PVCs, 9.6% couplets, 3.5% triplets, 44 brief runs of NSVT.   Past Surgical History:  Procedure Laterality Date   BREAST BIOPSY Right 11/12/2023   us  bx, 10:00 8 cmfn, ribbon marker, path pending   BREAST BIOPSY Right 11/12/2023   US  RT BREAST BX W LOC DEV 1ST LESION IMG BX SPEC US  GUIDE 11/12/2023 [facility]   CERVICAL CERCLAGE  06/01/2011   Procedure: CERCLAGE CERVICAL;  Surgeon: Atlas Blank, DO;  Location: [facility];  Service: Gynecology;  Laterality: N/A;  REQUESTING PORTABLE ULTARSOUND AT  BEDSIDE PT'S EDC:12/02/2011   CERVICAL CERCLAGE  06/01/2011   Procedure: CERCLAGE CERVICAL;  Surgeon: Atlas Blank, DO;  Location: [facility];  Service: Gynecology;  Laterality: N/A;  REQUESTING PORTABLE ULTARSOUND AT BEDSIDE PT'S EDC:12/02/2011   endosocpy     RIGHT/LEFT HEART CATH AND CORONARY ANGIOGRAPHY N/A 03/28/2023   Procedure: RIGHT/LEFT HEART CATH AND CORONARY ANGIOGRAPHY;  Surgeon: Mardell Shade, MD;  Location: [facility];  Service: Cardiovascular;  Laterality: N/A;   svd     x 2   tab     x 1   TUBAL LIGATION      Allergies  Allergies  Allergen Reactions   Penicillins Anaphylaxis    Has patient had a PCN reaction causing immediate rash, facial/tongue/throat swelling, SOB or lightheadedness with hypotension: Yes Has patient had a PCN reaction causing severe rash involving mucus membranes or skin necrosis: No Has patient had a PCN reaction that required hospitalization: No Has patient had a PCN reaction occurring within the last 10 years: No If all of the above answers are "NO", then may proceed with Cephalosporin use.      History of Present Illness      48 y.o. y/o female with a history of chronic HFpEF, PVCs, obesity, hypertension, hyperlipidemia, diabetes, obstructive sleep apnea, and arthritis.  She has a history of HFpEF dating back to at least 2018.  In September 2021, she underwent event monitoring in the setting of palpitations and was found to have 12.5% PVC burden.  She was seen by electrophysiology with consideration for 1A antiarrhythmic drug if she were to develop symptoms of LV dysfunction.  Sleep study in December 2023 showed AHI of 35.4 with saturation status 75%.  There was a question of atrial fibrillation during the study lasting 4 and half hours.  Subsequent Zio patch did not show any atrial fibrillation but showed 44 runs of nonsustained VT (longest 8 beats), as well as a 21.3% PVC burden.  She was seen again by Dr. Marven Slimmer and was not felt  to be a candidate for PVC ablation due to obesity.  She has been maintained on carvedilol  and was subsequently placed on mexiletine 300 mg twice daily.  In April 2024, she was seen in the emergency department with chest pain and normal troponins.  She subsequently underwent diagnostic catheterization in August 2024 showing normal coronary arteries with mild pulmonary hypertension and high output.  She was seen in the emergency department on August 15, 2023 in the setting of volume overload requiring intravenous Lasix .  Metolazone  was increased from 1-2 times per week and unfortunately, she developed hypotension in the AKI with creatinine up to 2.5 requiring admission on August 22, 2023.  Echocardiogram showed an EF of 60 to 65% with grade 1 diastolic dysfunction and mild RV dysfunction.    Since her January 2025 admission, she has since been taking torsemide  60 mg in the morning and 40 mg in the afternoon as well as metolazone  2.5 mg once a week.  She is followed closely by the advanced heart failure clinic, most recently seen there on April 10.  She is participating in cardiopulmonary rehab and tolerating well.  She notes that her weight has been stable at rehab visits, trending at 290 pounds.  She continues to wait for prior authorization for cardiac MRI planned by the advanced heart failure team.  She notes that since reducing her carvedilol  and Entresto  doses, fatigue has resolved.  She denies chest pain, dyspnea, palpitations, PND, orthopnea, dizziness, syncope, edema, or early satiety.  She notes today that she may require a total hysterectomy and is being followed closely by GYN in the setting of uterine fibroids. Objective  Home Medications    Current Outpatient Medications  Medication Sig Dispense Refill   albuterol  (VENTOLIN  HFA) 108 (90 Base) MCG/ACT inhaler Inhale 1-2 puffs into the lungs every 6 (six) hours as needed. 18 g 0   aluminum-magnesium hydroxide 200-200 MG/5ML suspension Take  15 mLs by mouth every 6 (six) hours as needed for indigestion.     aspirin  EC 81 MG tablet Take 81 mg by mouth daily.     atorvastatin  (LIPITOR) 20 MG tablet TAKE 1 TABLET(20 MG) BY MOUTH DAILY 90 tablet 3   carvedilol  (COREG ) 25 MG tablet Take 0.5 tablets (12.5 mg total) by mouth 2 (two) times daily with a meal. 225 tablet 3   cetirizine (ZYRTEC) 10 MG tablet Take 10 mg by mouth daily.     empagliflozin  (JARDIANCE ) 25 MG TABS tablet Take 1 tablet (25 mg total) by mouth daily before breakfast. 30 tablet 6   famotidine  (PEPCID ) 20 MG tablet Take 1 tablet (20 mg total) by mouth 2 (two) times daily.     famotidine -calcium  carbonate-magnesium hydroxide (PEPCID  COMPLETE) 10-800-165 MG chewable tablet Chew 1 tablet by mouth daily as needed.     fluticasone  (FLONASE ) 50 MCG/ACT nasal spray Place 2  sprays into both nostrils daily.     latanoprost  (XALATAN ) 0.005 % ophthalmic solution Place 1 drop into both eyes at bedtime.     meclizine  (ANTIVERT ) 25 MG tablet Take 1 tablet (25 mg total) by mouth 3 (three) times daily as needed for dizziness or nausea. 30 tablet 1   metolazone  (ZAROXOLYN ) 2.5 MG tablet Take 1 tablet (2.5 mg total) by mouth once a week.     mexiletine (MEXITIL ) 150 MG capsule Take 2 capsules (300 mg total) by mouth 2 (two) times daily. 360 capsule 3   pantoprazole  (PROTONIX ) 40 MG tablet Take 1 tablet (40 mg total) by mouth daily. 90 tablet 3   potassium chloride  20 MEQ/15ML (10%) SOLN Take 60 mLs (80 mEq total) by mouth daily. And additional 60meq ( ) on the days you take the metolazone  473 mL 6   sacubitril -valsartan  (ENTRESTO ) 49-51 MG Take 1 tablet by mouth 2 (two) times daily. 60 tablet 3   Semaglutide ,0.25 or 0.5MG /DOS, 2 MG/3ML SOPN Inject 0.25 mg into the skin once a week. (Patient taking differently: Inject 0.5 mg into the skin once a week.) 3 mL 5   spironolactone  (ALDACTONE ) 25 MG tablet TAKE 1 TABLET(25 MG) BY MOUTH DAILY 90 tablet 3   torsemide  (DEMADEX ) 20 MG tablet Take 4  tablets (80 mg total) by mouth daily. (Patient taking differently: Take 40 mg by mouth 2 (two) times daily. Take 60MG  in the morning and 40 MG at night) 180 tablet 6   No current facility-administered medications for this visit.     Physical Exam    VS:  BP 102/70 (BP Location: Left Arm, Patient Position: Sitting, Cuff Size: Large)   Pulse 82   Ht 5\' 5"  (1.651 m)   Wt 296 lb (134.3 kg)   LMP 11/03/2023   SpO2 96%   BMI 49.26 kg/m  , BMI Body mass index is 49.26 kg/m.       GEN: Obese, in no acute distress. HEENT: normal. Neck: Supple, no JVD, carotid bruits, or masses. Cardiac: Irregular-frequent ectopy, 2/6 systolic murmur at the upper sternal borders, no rubs or gallops.  No clubbing, cyanosis, edema.  Radials 2+/PT 2+ and equal bilaterally.  Respiratory:  Respirations regular and unlabored, clear to auscultation bilaterally. GI: Soft, nontender, nondistended, BS + x 4. MS: no deformity or atrophy. Skin: warm and dry, no rash. Neuro:  Strength and sensation are intact. Psych: Normal affect.  Accessory Clinical Findings    ECG personally reviewed by me today - EKG Interpretation Date/Time:  Wednesday November 21 2023 11:11:40 EDT Ventricular Rate:  82 PR Interval:  168 QRS Duration:  78 QT Interval:  392 QTC Calculation: 457 R Axis:   2  Text Interpretation: Sinus rhythm with frequent Premature ventricular complexes Confirmed by Laneta Pintos [phone]) on 11/21/2023 11:49:03 AM  - no acute changes.  Lab Results  Component Value Date   WBC 6.4 08/23/2023   HGB 14.9 08/23/2023   HCT 44.8 08/23/2023   MCV 90.0 08/23/2023   PLT 291 08/23/2023   Lab Results  Component Value Date   CREATININE 1.83 (H) 10/18/2023   BUN 32 (H) 10/18/2023   NA 136 10/18/2023   K 4.4 10/18/2023   CL 101 10/18/2023   CO2 28 10/18/2023   Lab Results  Component Value Date   ALT 29 11/07/2023   AST 33 11/07/2023   ALKPHOS 91 11/07/2023   BILITOT 0.4 11/07/2023   Lab Results   Component Value Date   CHOL 219 (  H) 06/05/2020   HDL 48 06/05/2020   LDLCALC 144 (H) 06/05/2020   TRIG 133 06/05/2020   CHOLHDL 4.6 06/05/2020    Lab Results  Component Value Date   HGBA1C 8.5 (H) 08/22/2023   Lab Results  Component Value Date   TSH 2.800 10/17/2023       Assessment & Plan    1.  Chronic HFpEF: Most recent echo in January 2025 with an EF of 55 to 60%, grade 1 diastolic dysfunction, and mildly reduced RV function.  She has been followed closely by advanced heart failure clinic and overall has done well.  She is also in cardiac rehabilitation and tolerating exercise well.  Though her weight is up on our scale by 6 pounds compared to weight at cardiac rehab yesterday, she is euvolemic on examination today and overall has been feeling well.  Suspect weight variability simply due to use of different scales at this time.  Heart rate and blood pressure are stable and fatigue has improved since reducing carvedilol  and Entresto  doses.  She also remains on spironolactone , empagliflozin , semaglutide , torsemide  60 mg in the morning, 40 mg in the afternoon, and metolazone  2.5 mg once weekly.  She is pending cardiac MRI once approved by insurance.  No changes to medications today.  She will continue to follow with cardiac rehab.  2.  Primary hypertension: Blood pressure well-controlled on beta-blocker, Entresto , and diuretic therapy.  3.  Frequent PVCs: 21.3% PVC burden most recent Zio in January 2024.  Normal coronaries by catheterization in August 2024.  Currently on carvedilol  and mexiletine with frequent PVCs on ECG today.  Asymptomatic.  Pending cardiac MRI once approved by insurance.  She has EP follow-up in 2 weeks.  Previously not felt to be a candidate for ablation.  4.  Obstructive sleep apnea: Compliant with CPAP and tolerates well.  Plan for titration study tomorrow.  5.  Morbid obesity: On semaglutide  and participating in cardiopulmonary rehab though no significant change  in weight up to this point.  6.  Questionable history of A-fib: Question of A-fib during sleep study in late 2023.  Subsequent ZIO monitoring did not show any atrial fibrillation but frequent PVCs noted.  Irregularity noted on Apple Watch likely secondary to PVCs.  7.  Uterine fibroids: She is concerned that at some point, she may require a total hysterectomy.  Reassurance provided that we will help manage her preoperative clearance when the time arises.  8.  Disposition: Patient has EP follow-up midnight.  She will otherwise continue to follow-up in the advanced heart failure clinic as scheduled.  Laneta Pintos, NP 11/21/2023, 11:57 AM

## 2023-11-21 NOTE — Progress Notes (Signed)
 Daily Session Note  Patient Details  Name: Heather Norman MRN: [id number] Date of Birth: 1975/08/04 Referring Provider:   Flowsheet Row Pulmonary Rehab from 10/22/2023 in [facility] Cardiac and Pulmonary Rehab  Referring Provider Dr. Belva Boyden, MD       Encounter Date: 11/21/2023  Check In:  Session Check In - 11/21/23 1651       Check-In   Supervising physician immediately available to respond to emergencies See telemetry face sheet for immediately available ER MD    Location [facility]-Cardiac & Pulmonary Rehab    Staff Present Sue Em RN,BSN;Susanne Bice, RN, BSN, CCRP;Joseph Hood RCP,RRT,BSRT;Kelly Nixon BS, ACSM CEP, Exercise Physiologist    Virtual Visit No    Medication changes reported     No    Fall or balance concerns reported    No    Warm-up and Cool-down Performed on first and last piece of equipment    Resistance Training Performed Yes    VAD Patient? No    PAD/SET Patient? No      Pain Assessment   Currently in Pain? No/denies                Social History   Tobacco Use  Smoking Status Never  Smokeless Tobacco Never    Goals Met:  Independence with exercise equipment Exercise tolerated well No report of concerns or symptoms today Strength training completed today  Goals Unmet:  Not Applicable  Comments: Pt able to follow exercise prescription today without complaint.  Will continue to monitor for progression.    Dr. Firman Hughes is Medical Director for [facility] Cardiac Rehabilitation.  Dr. Fuad Aleskerov is Medical Director for [facility] Pulmonary Rehabilitation.

## 2023-11-22 ENCOUNTER — Encounter: Admitting: *Deleted

## 2023-11-22 ENCOUNTER — Ambulatory Visit: Attending: Otolaryngology

## 2023-11-22 DIAGNOSIS — I5032 Chronic diastolic (congestive) heart failure: Secondary | ICD-10-CM | POA: Diagnosis not present

## 2023-11-22 DIAGNOSIS — G4710 Hypersomnia, unspecified: Secondary | ICD-10-CM | POA: Diagnosis present

## 2023-11-22 DIAGNOSIS — G4733 Obstructive sleep apnea (adult) (pediatric): Secondary | ICD-10-CM | POA: Diagnosis present

## 2023-11-22 DIAGNOSIS — R0683 Snoring: Secondary | ICD-10-CM | POA: Diagnosis present

## 2023-11-22 NOTE — Progress Notes (Signed)
 Daily Session Note  Patient Details  Name: TOMASINA KEASLING MRN: [id number] Date of Birth: 16-Jul-1976 Referring Provider:   Flowsheet Row Pulmonary Rehab from 10/22/2023 in [facility] Cardiac and Pulmonary Rehab  Referring Provider Dr. Belva Boyden, MD       Encounter Date: 11/22/2023  Check In:  Session Check In - 11/22/23 1731       Check-In   Supervising physician immediately available to respond to emergencies See telemetry face sheet for immediately available ER MD    Location [facility]Cardiac [facility] Pulmonary Rehab    Staff Present Sue Em RN,BSN;Joseph [facility] BS, Exercise Physiologist    Virtual Visit No    Medication changes reported     No    Fall or balance concerns reported    No    Warm-up and Cool-down Performed on first and last piece of equipment    Resistance Training Performed Yes    VAD Patient? No      Pain Assessment   Currently in Pain? No/denies                Social History   Tobacco Use  Smoking Status Never  Smokeless Tobacco Never    Goals Met:  Independence with exercise equipment Exercise tolerated well No report of concerns or symptoms today Strength training completed today  Goals Unmet:  Not Applicable  Comments: Pt able to follow exercise prescription today without complaint.  Will continue to monitor for progression.    Dr. Firman Hughes is Medical Director for [facility] Cardiac Rehabilitation.  Dr. Fuad Aleskerov is Medical Director for [facility] Pulmonary Rehabilitation.

## 2023-11-26 ENCOUNTER — Telehealth: Payer: Self-pay

## 2023-11-26 ENCOUNTER — Encounter: Admitting: *Deleted

## 2023-11-26 DIAGNOSIS — D251 Intramural leiomyoma of uterus: Secondary | ICD-10-CM

## 2023-11-26 DIAGNOSIS — N921 Excessive and frequent menstruation with irregular cycle: Secondary | ICD-10-CM

## 2023-11-26 DIAGNOSIS — I5032 Chronic diastolic (congestive) heart failure: Secondary | ICD-10-CM

## 2023-11-26 DIAGNOSIS — E282 Polycystic ovarian syndrome: Secondary | ICD-10-CM

## 2023-11-26 MED ORDER — ORILISSA 150 MG PO TABS
1.0000 | ORAL_TABLET | Freq: Every day | ORAL | 2 refills | Status: AC
Start: 2023-11-26 — End: 2024-02-24

## 2023-11-26 NOTE — Telephone Encounter (Signed)
 TRIAGE VOICEMAIL: Patient states she was given a trial of birth control pills. She is about to take the last one. She is inquiring if she is to continue as she has not received a rx or rx has not been sent to her pharmacy.

## 2023-11-26 NOTE — Telephone Encounter (Signed)
 Chart reviewed, 11/07/23 visit note: -2wk samples of Orilissa given today, Rx sent for 150mg , coupon card provided;  Left voicemail to clarify if Roxie Cord is the medication she is inquiring about.

## 2023-11-26 NOTE — Progress Notes (Signed)
 Daily Session Note  Patient Details  Name: Heather Norman MRN: [id number] Date of Birth: 02-[name]-1976 Referring Provider:   Flowsheet Row Pulmonary Rehab from 10/22/2023 in [facility] Cardiac and Pulmonary Rehab  Referring Provider Dr. Belva Boyden, MD       Encounter Date: 11/26/2023  Check In:  Session Check In - 11/26/23 1128       Check-In   Supervising physician immediately available to respond to emergencies See telemetry face sheet for immediately available ER MD    Location [facility]Cardiac & Pulmonary Rehab    Staff Present Maud Sorenson, RN, BSN, CCRP;Meredith Manson Seitz RN,BSN;Joseph Hood RCP,RRT,BSRT;Kelly [place] BS, ACSM CEP, Exercise Physiologist;Maxon [place] BS, Exercise Physiologist    Virtual Visit No    Medication changes reported     No    Fall or balance concerns reported    No    Warm-up and Cool-down Performed on first and last piece of equipment    Resistance Training Performed Yes    VAD Patient? No    PAD/SET Patient? No      Pain Assessment   Currently in Pain? No/denies                Social History   Tobacco Use  Smoking Status Never  Smokeless Tobacco Never    Goals Met:  Proper associated with RPD/PD & O2 Sat Independence with exercise equipment Exercise tolerated well No report of concerns or symptoms today  Goals Unmet:  Not Applicable  Comments: Pt able to follow exercise prescription today without complaint.  Will continue to monitor for progression.    Dr. Firman Hughes is Medical Director for [facility] Cardiac Rehabilitation.  Dr. Fuad Aleskerov is Medical Director for [facility] Pulmonary Rehabilitation.

## 2023-11-26 NOTE — Telephone Encounter (Signed)
 Patient advised Heather Norman is the correct medication. Rx not found on file. Advised will send. Patient aware usually needs prior authorization. She does have the coupon card.

## 2023-11-28 ENCOUNTER — Encounter: Admitting: *Deleted

## 2023-11-28 DIAGNOSIS — I5032 Chronic diastolic (congestive) heart failure: Secondary | ICD-10-CM

## 2023-11-28 NOTE — Progress Notes (Signed)
 Daily Session Note  Patient Details  Name: Heather Norman MRN: [id number] Date of Birth: 1976-07-07 Referring Provider:   Flowsheet Row Pulmonary Rehab from 10/22/2023 in [facility] Cardiac and Pulmonary Rehab  Referring Provider Dr. Belva Boyden, MD       Encounter Date: 11/28/2023  Check In:  Session Check In - 11/28/23 1647       Check-In   Supervising physician immediately available to respond to emergencies See telemetry face sheet for immediately available ER MD    Location [facility]-Cardiac & Pulmonary Rehab    Staff Present Sue Em RN,BSN;Susanne Bice, RN, BSN, CCRP;Joseph Hood RCP,RRT,BSRT;Kelly [place] BS, ACSM CEP, Exercise Physiologist    Virtual Visit No    Medication changes reported     No    Fall or balance concerns reported    No    Warm-up and Cool-down Performed on first and last piece of equipment    Resistance Training Performed Yes    VAD Patient? No    PAD/SET Patient? No      Pain Assessment   Currently in Pain? No/denies                Social History   Tobacco Use  Smoking Status Never  Smokeless Tobacco Never    Goals Met:  Independence with exercise equipment Exercise tolerated well No report of concerns or symptoms today Strength training completed today  Goals Unmet:  Not Applicable  Comments: Pt able to follow exercise prescription today without complaint.  Will continue to monitor for progression.    Dr. Firman Hughes is Medical Director for [facility] Cardiac Rehabilitation.  Dr. Fuad Aleskerov is Medical Director for [facility] Pulmonary Rehabilitation.

## 2023-11-29 ENCOUNTER — Encounter: Attending: Cardiovascular Disease | Admitting: *Deleted

## 2023-11-29 DIAGNOSIS — I5032 Chronic diastolic (congestive) heart failure: Secondary | ICD-10-CM | POA: Insufficient documentation

## 2023-11-29 NOTE — Progress Notes (Signed)
 Daily Session Note  Patient Details  Name: Heather Norman MRN: [id number] Date of Birth: 12/15/1975 Referring Provider:   Flowsheet Row Pulmonary Rehab from 10/22/2023 in [facility] Cardiac and Pulmonary Rehab  Referring Provider Dr. Belva Boyden, MD       Encounter Date: 11/29/2023  Check In:  Session Check In - 11/29/23 1715       Check-In   Supervising physician immediately available to respond to emergencies See telemetry face sheet for immediately available ER MD    Location [facility]-Cardiac & Pulmonary Rehab    Staff Present Sue Em RN,BSN;Joseph [facility] BS, Exercise Physiologist    Virtual Visit No    Medication changes reported     No    Fall or balance concerns reported    No    Warm-up and Cool-down Performed on first and last piece of equipment    Resistance Training Performed Yes    VAD Patient? No    PAD/SET Patient? No      Pain Assessment   Currently in Pain? No/denies                Social History   Tobacco Use  Smoking Status Never  Smokeless Tobacco Never    Goals Met:  Independence with exercise equipment Exercise tolerated well No report of concerns or symptoms today Strength training completed today  Goals Unmet:  Not Applicable  Comments: Pt able to follow exercise prescription today without complaint.  Will continue to monitor for progression.    Dr. Firman Hughes is Medical Director for [facility] Cardiac Rehabilitation.  Dr. Fuad Aleskerov is Medical Director for [facility] Pulmonary Rehabilitation.

## 2023-12-03 ENCOUNTER — Encounter: Admitting: *Deleted

## 2023-12-03 DIAGNOSIS — I5032 Chronic diastolic (congestive) heart failure: Secondary | ICD-10-CM

## 2023-12-03 NOTE — Progress Notes (Signed)
 Daily Session Note  Patient Details  Name: Heather Norman MRN: [id number] Date of Birth: 1976/01/06 Referring Provider:   Flowsheet Row Pulmonary Rehab from 10/22/2023 in [facility] Cardiac and Pulmonary Rehab  Referring Provider Dr. Belva Boyden, MD       Encounter Date: 12/03/2023  Check In:  Session Check In - 12/03/23 1117       Check-In   Supervising physician immediately available to respond to emergencies See telemetry face sheet for immediately available ER MD    Location [facility]-Cardiac & Pulmonary Rehab    Staff Present Maxon [place] Bounds, Exercise Physiologist;Joseph Hood RCP,RRT,BSRT;Amaar Oshita Manson Seitz RN,BSN;Kelly [place] BS, ACSM CEP, Exercise Physiologist    Virtual Visit No    Medication changes reported     No    Fall or balance concerns reported    No    Warm-up and Cool-down Performed on first and last piece of equipment    Resistance Training Performed Yes    VAD Patient? No    PAD/SET Patient? No      Pain Assessment   Currently in Pain? No/denies                Social History   Tobacco Use  Smoking Status Never  Smokeless Tobacco Never    Goals Met:  Independence with exercise equipment Exercise tolerated well No report of concerns or symptoms today Strength training completed today  Goals Unmet:  Not Applicable  Comments: Pt able to follow exercise prescription today without complaint.  Will continue to monitor for progression.    Dr. Firman Hughes is Medical Director for [facility] Cardiac Rehabilitation.  Dr. Fuad Aleskerov is Medical Director for [facility] Pulmonary Rehabilitation.

## 2023-12-05 ENCOUNTER — Encounter: Admitting: *Deleted

## 2023-12-05 ENCOUNTER — Encounter: Payer: Self-pay | Admitting: *Deleted

## 2023-12-05 DIAGNOSIS — I5032 Chronic diastolic (congestive) heart failure: Secondary | ICD-10-CM

## 2023-12-05 NOTE — Progress Notes (Signed)
 Daily Session Note  Patient Details  Name: Heather Norman MRN: [id number] Date of Birth: [birth year] Referring Provider:   Flowsheet Row Pulmonary Rehab from 10/22/2023 in [facility]  Referring Provider Dr. Belva Boyden, MD       Encounter Date: 12/05/2023  Check In:  Session Check In - 12/05/23 1720       Check-In   Supervising physician immediately available to respond to emergencies See telemetry face sheet for immediately available ER MD    Location [facility]    Staff Present Sue Em RN,BSN;[facility] Sabra Cramp BS, ACSM CEP, Exercise Physiologist    Virtual Visit No    Medication changes reported     No    Fall or balance concerns reported    No    Warm-up and Cool-down Performed on first and last piece of equipment    Resistance Training Performed Yes    VAD Patient? No    PAD/SET Patient? No      Pain Assessment   Currently in Pain? No/denies                Social History   Tobacco Use  Smoking Status Never  Smokeless Tobacco Never    Goals Met:  Independence with exercise equipment Exercise tolerated well No report of concerns or symptoms today Strength training completed today  Goals Unmet:  Not Applicable  Comments: Pt able to follow exercise prescription today without complaint.  Will continue to monitor for progression.    Dr. Firman Hughes is Medical Director for [facility].  Dr. Fuad Aleskerov is Medical Director for [facility] Pulmonary Rehabilitation.

## 2023-12-05 NOTE — Progress Notes (Signed)
 Pulmonary Individual Treatment Plan  Patient Details  Name: Heather Norman MRN: [id number] Date of Birth: 09-19-1975 Referring Provider:   Flowsheet Row Pulmonary Rehab from 10/22/2023 in [facility] Cardiac and Pulmonary Rehab  Referring Provider Dr. Belva Boyden, MD       Initial Encounter Date:  Flowsheet Row Pulmonary Rehab from 10/22/2023 in [facility] Cardiac and Pulmonary Rehab  Date 10/22/23       Visit Diagnosis: Heart failure, diastolic, chronic (HCC)  Patient's Home Medications on Admission:  Current Outpatient Medications:    albuterol  (VENTOLIN  HFA) 108 (90 Base) MCG/ACT inhaler, Inhale 1-2 puffs into the lungs every 6 (six) hours as needed., Disp: 18 g, Rfl: 0   aluminum-magnesium hydroxide 200-200 MG/5ML suspension, Take 15 mLs by mouth every 6 (six) hours as needed for indigestion., Disp: , Rfl:    aspirin  EC 81 MG tablet, Take 81 mg by mouth daily., Disp: , Rfl:    atorvastatin  (LIPITOR) 20 MG tablet, TAKE 1 TABLET(20 MG) BY MOUTH DAILY, Disp: 90 tablet, Rfl: 3   carvedilol  (COREG ) 25 MG tablet, Take 0.5 tablets (12.5 mg total) by mouth 2 (two) times daily with a meal., Disp: 225 tablet, Rfl: 3   cetirizine (ZYRTEC) 10 MG tablet, Take 10 mg by mouth daily., Disp: , Rfl:    Elagolix Sodium  (ORILISSA ) 150 MG TABS, Take 1 tablet (150 mg total) by mouth daily., Disp: 30 tablet, Rfl: 2   empagliflozin  (JARDIANCE ) 25 MG TABS tablet, Take 1 tablet (25 mg total) by mouth daily before breakfast., Disp: 30 tablet, Rfl: 6   famotidine  (PEPCID ) 20 MG tablet, Take 1 tablet (20 mg total) by mouth 2 (two) times daily., Disp: , Rfl:    famotidine -calcium  carbonate-magnesium hydroxide (PEPCID  COMPLETE) 10-800-165 MG chewable tablet, Chew 1 tablet by mouth daily as needed., Disp: , Rfl:    fluticasone  (FLONASE ) 50 MCG/ACT nasal spray, Place 2 sprays into both nostrils daily., Disp: , Rfl:    latanoprost  (XALATAN ) 0.005 % ophthalmic solution, Place 1 drop into both eyes at bedtime., Disp: , Rfl:     meclizine  (ANTIVERT ) 25 MG tablet, Take 1 tablet (25 mg total) by mouth 3 (three) times daily as needed for dizziness or nausea., Disp: 30 tablet, Rfl: 1   metolazone  (ZAROXOLYN ) 2.5 MG tablet, Take 1 tablet (2.5 mg total) by mouth once a week., Disp: , Rfl:    mexiletine (MEXITIL ) 150 MG capsule, Take 2 capsules (300 mg total) by mouth 2 (two) times daily., Disp: 360 capsule, Rfl: 3   pantoprazole  (PROTONIX ) 40 MG tablet, Take 1 tablet (40 mg total) by mouth daily., Disp: 90 tablet, Rfl: 3   potassium chloride  20 MEQ/15ML (10%) SOLN, Take 60 mLs (80 mEq total) by mouth daily. And additional 60meq ( ) on the days you take the metolazone , Disp: 473 mL, Rfl: 6   sacubitril -valsartan  (ENTRESTO ) 49-51 MG, Take 1 tablet by mouth 2 (two) times daily., Disp: 60 tablet, Rfl: 3   Semaglutide ,0.25 or 0.5MG /DOS, 2 MG/3ML SOPN, Inject 0.25 mg into the skin once a week. (Patient taking differently: Inject 0.5 mg into the skin once a week.), Disp: 3 mL, Rfl: 5   spironolactone  (ALDACTONE ) 25 MG tablet, TAKE 1 TABLET(25 MG) BY MOUTH DAILY, Disp: 90 tablet, Rfl: 3   torsemide  (DEMADEX ) 20 MG tablet, Take 4 tablets (80 mg total) by mouth daily. (Patient taking differently: Take 40 mg by mouth 2 (two) times daily. Take 60MG  in the morning and 40 MG at night), Disp: 180 tablet, Rfl: 6  Past Medical History: Past Medical History:  Diagnosis Date   (HFpEF) heart failure with preserved ejection fraction (HCC)    a. 04/2017 Echo: EF 55-60%, no rwma, Mild MR, mildly dil LA. Nl RV fxn; b. 06/2019 Echo: EF 60-65%, mod LVH. Gr2 DD. Mildly dil LA; c. 03/2023 Cath: Nl cors. mild PAH (PA 31/17 (26) w/ high output (CO/CI 8.4/3.5); d. 08/2023 Echo: EF 55-60%, no rwma, GrI DD, mildly reduced RV fxn, RVSP 21.6 mmHg. No significant valvular dzs.   Arthritis    kness/hands - no meds   Diabetes mellitus without complication (HCC)    DM (diabetes mellitus) (HCC) 05/11/2017   Dyspnea on exertion    Headache(784.0)    otc med  prn   Heart murmur    History of cardiac cath    a. 03/2023 Cath: Nl cors.   Hyperlipidemia    no meds since pregnancy   Hypertension    Morbid obesity (HCC)    NSVT (nonsustained ventricular tachycardia) (HCC)    a. 07/2022 Zio: 44 runs NSVT, longest 8 beats, fastest 182. 3 brief SVT runs.  21.3% PVC burden.   PVC (premature ventricular contraction)    a. 07/2022 Zio: 21.3 PVCs, 9.6% couplets, 3.5% triplets, 44 brief runs of NSVT.    Tobacco Use: Social History   Tobacco Use  Smoking Status Never  Smokeless Tobacco Never    Labs: Review Flowsheet  More data may exist      Latest Ref Rng & Units 12/13/2012 01/14/2015 06/05/2020 03/28/2023 08/22/2023  Labs for ITP Cardiac and Pulmonary Rehab  Cholestrol 0 - 200 mg/dL 161  096  045  - -  LDL (calc) 0 - 99 mg/dL 409  811  914  - -  HDL-C >40 mg/dL 52  42  48  - -  Trlycerides <150 mg/dL 84  782  956  - -  Hemoglobin A1c 4.8 - 5.6 % 6.6  - - - 8.5   PH, Arterial 7.35 - 7.45 - - - 7.372  -  PCO2 arterial 32 - 48 mmHg - - - 38.3  -  Bicarbonate 20.0 - 28.0 mmol/L - - - 18.9  24.8  22.2  -  TCO2 22 - 32 mmol/L - - - 20  26  23   -  Acid-base deficit 0.0 - 2.0 mmol/L - - - 7.0  1.0  3.0  -  O2 Saturation % - - - 71  73  96  -    Details       Multiple values from one day are sorted in reverse-chronological order          Pulmonary Assessment Scores:  Pulmonary Assessment Scores     Row Name 10/22/23 1532         ADL UCSD   ADL Phase Entry     SOB Score total 20     Rest 0     Walk 0     Stairs 4     Bath 0     Dress 0     Shop 0       CAT Score   CAT Score 20       mMRC Score   mMRC Score 2              UCSD: Self-administered rating of dyspnea associated with activities of daily living (ADLs) 6-point scale (0 = "not at all" to 5 = "maximal or unable to do because of breathlessness")  Scoring Scores range  from 0 to 120.  Minimally important difference is 5 units  CAT: CAT can identify the health  impairment of COPD patients and is better correlated with disease progression.  CAT has a scoring range of zero to 40. The CAT score is classified into four groups of low (less than 10), medium (10 - 20), high (21-30) and very high (31-40) based on the impact level of disease on health status. A CAT score over 10 suggests significant symptoms.  A worsening CAT score could be explained by an exacerbation, poor medication adherence, poor inhaler technique, or progression of COPD or comorbid conditions.  CAT MCID is 2 points  mMRC: mMRC (Modified Medical Research Council) Dyspnea Scale is used to assess the degree of baseline functional disability in patients of respiratory disease due to dyspnea. No minimal important difference is established. A decrease in score of 1 point or greater is considered a positive change.   Pulmonary Function Assessment:   Exercise Target Goals: Exercise Program Goal: Individual exercise prescription set using results from initial 6 min walk test and THRR while considering  patient's activity barriers and safety.   Exercise Prescription Goal: Initial exercise prescription builds to 30-45 minutes a day of aerobic activity, 2-3 days per week.  Home exercise guidelines will be given to patient during program as part of exercise prescription that the participant will acknowledge.  Education: Aerobic Exercise: - Group verbal and visual presentation on the components of exercise prescription. Introduces F.I.T.T principle from ACSM for exercise prescriptions.  Reviews F.I.T.T. principles of aerobic exercise including progression. Written material given at graduation. Flowsheet Row Pulmonary Rehab from 11/28/2023 in [facility] Cardiac and Pulmonary Rehab  Education need identified 10/22/23       Education: Resistance Exercise: - Group verbal and visual presentation on the components of exercise prescription. Introduces F.I.T.T principle from ACSM for exercise prescriptions   Reviews F.I.T.T. principles of resistance exercise including progression. Written material given at graduation.    Education: Exercise & Equipment Safety: - Individual verbal instruction and demonstration of equipment use and safety with use of the equipment. Flowsheet Row Pulmonary Rehab from 11/28/2023 in [facility] Cardiac and Pulmonary Rehab  Date 10/22/23  Educator NT  Instruction Review Code 1- Verbalizes Understanding       Education: Exercise Physiology & General Exercise Guidelines: - Group verbal and written instruction with models to review the exercise physiology of the cardiovascular system and associated critical values. Provides general exercise guidelines with specific guidelines to those with heart or lung disease.    Education: Flexibility, Balance, Mind/Body Relaxation: - Group verbal and visual presentation with interactive activity on the components of exercise prescription. Introduces F.I.T.T principle from ACSM for exercise prescriptions. Reviews F.I.T.T. principles of flexibility and balance exercise training including progression. Also discusses the mind body connection.  Reviews various relaxation techniques to help reduce and manage stress (i.e. Deep breathing, progressive muscle relaxation, and visualization). Balance handout provided to take home. Written material given at graduation.   Activity Barriers & Risk Stratification:  Activity Barriers & Cardiac Risk Stratification - 10/22/23 1536       Activity Barriers & Cardiac Risk Stratification   Activity Barriers Shortness of Breath;Balance Concerns   Vertigo            6 Minute Walk:  6 Minute Walk     Row Name 10/22/23 1534         6 Minute Walk   Phase Initial     Distance 940 feet  Walk Time 6 minutes     # of Rest Breaks 0     MPH 1.78     METS 2.46     RPE 11     Perceived Dyspnea  0     VO2 Peak 8.6     Symptoms Yes (comment)     Comments burning in legs     Resting HR 58 bpm      Resting BP 112/72     Resting Oxygen Saturation  94 %     Exercise Oxygen Saturation  during 6 min walk 93 %     Max Ex. HR 119 bpm     Max Ex. BP 124/76     2 Minute Post BP 106/70       Interval HR   1 Minute HR 111     2 Minute HR 101     3 Minute HR 108     4 Minute HR 84     5 Minute HR 108     6 Minute HR 119     2 Minute Post HR 78     Interval Heart Rate? Yes       Interval Oxygen   Interval Oxygen? Yes     Baseline Oxygen Saturation % 94 %     1 Minute Oxygen Saturation % 93 %     1 Minute Liters of Oxygen 0 L  RA     2 Minute Oxygen Saturation % 94 %     2 Minute Liters of Oxygen 0 L     3 Minute Oxygen Saturation % 93 %     3 Minute Liters of Oxygen 0 L     4 Minute Oxygen Saturation % 95 %     4 Minute Liters of Oxygen 0 L     5 Minute Oxygen Saturation % 94 %     5 Minute Liters of Oxygen 0 L     6 Minute Oxygen Saturation % 94 %     6 Minute Liters of Oxygen 0 L     2 Minute Post Oxygen Saturation % 95 %     2 Minute Post Liters of Oxygen 0 L             Oxygen Initial Assessment:  Oxygen Initial Assessment - 10/11/23 1251       Home Oxygen   Home Oxygen Device None    Sleep Oxygen Prescription None    Home Exercise Oxygen Prescription None    Home Resting Oxygen Prescription None    Compliance with Home Oxygen Use Yes      Intervention   Short Term Goals To learn and demonstrate proper use of respiratory medications;To learn and demonstrate proper pursed lip breathing techniques or other breathing techniques.     Long  Term Goals Demonstrates proper use of MDI's;Compliance with respiratory medication;Exhibits proper breathing techniques, such as pursed lip breathing or other method taught during program session             Oxygen Re-Evaluation:  Oxygen Re-Evaluation     Row Name 10/24/23 1716 11/22/23 1715           Program Oxygen Prescription   Program Oxygen Prescription -- None        Home Oxygen   Home Oxygen Device -- None       Sleep Oxygen Prescription -- CPAP      Liters per minute -- 0      Home  Exercise Oxygen Prescription -- None      Home Resting Oxygen Prescription -- None      Compliance with Home Oxygen Use -- Yes        Goals/Expected Outcomes   Short Term Goals To learn and demonstrate proper use of respiratory medications;To learn and demonstrate proper pursed lip breathing techniques or other breathing techniques.  Other      Long  Term Goals Demonstrates proper use of MDI's;Compliance with respiratory medication;Exhibits proper breathing techniques, such as pursed lip breathing or other method taught during program session Other      Comments Reviewed PLB technique with pt.  Talked about how it works and it's importance in maintaining their exercise saturations. Clorine is having a sleep study tonight. She should find out the results in the next week or so. She will informed staff of any changes to her sleep study. She is wearing her CPAP now and this a sleep study to get an update on her sleep apnea.      Goals/Expected Outcomes Short: Become more profiecient at using PLB.   Long: Become independent at using PLB. Short: do sleep study. Long: wear CPAP/BiPAP routinely.               Oxygen Discharge (Final Oxygen Re-Evaluation):  Oxygen Re-Evaluation - 11/22/23 1715       Program Oxygen Prescription   Program Oxygen Prescription None      Home Oxygen   Home Oxygen Device None    Sleep Oxygen Prescription CPAP    Liters per minute 0    Home Exercise Oxygen Prescription None    Home Resting Oxygen Prescription None    Compliance with Home Oxygen Use Yes      Goals/Expected Outcomes   Short Term Goals Other    Long  Term Goals Other    Comments Heather Norman is having a sleep study tonight. She should find out the results in the next week or so. She will informed staff of any changes to her sleep study. She is wearing her CPAP now and this a sleep study to get an update on her sleep apnea.     Goals/Expected Outcomes Short: do sleep study. Long: wear CPAP/BiPAP routinely.             Initial Exercise Prescription:  Initial Exercise Prescription - 10/22/23 1500       Date of Initial Exercise RX and Referring Provider   Date 10/22/23    Referring Provider Dr. Timothy Gollan, MD      Oxygen   Maintain Oxygen Saturation 88% or higher      Treadmill   MPH 1.8    Grade 0.5    Minutes 15    METs 2.5      Recumbant Bike   Level 1    RPM 50    Watts 19    Minutes 15    METs 2.46      NuStep   Level 2    SPM 80    Minutes 15    METs 2.46      Prescription Details   Frequency (times per week) 3    Duration Progress to 30 minutes of continuous aerobic without signs/symptoms of physical distress      Intensity   THRR 40-80% of Max Heartrate 103-148    Ratings of Perceived Exertion 11-13    Perceived Dyspnea 0-4      Progression   Progression Continue to progress workloads to maintain intensity  without signs/symptoms of physical distress.      Resistance Training   Training Prescription Yes    Weight 3 lb    Reps 10-15             Perform Capillary Blood Glucose checks as needed.  Exercise Prescription Changes:   Exercise Prescription Changes     Row Name 10/22/23 1500 11/01/23 1100 11/15/23 0800 11/27/23 1500       Response to Exercise   Blood Pressure (Admit) 112/72 122/64 118/60 126/78    Blood Pressure (Exercise) 124/76 150/68 150/74 140/82    Blood Pressure (Exit) 106/70 110/70 104/68 126/62    Heart Rate (Admit) 58 bpm 77 bpm 65 bpm 83 bpm    Heart Rate (Exercise) 119 bpm 108 bpm 107 bpm 115 bpm    Heart Rate (Exit) 78 bpm 88 bpm 71 bpm 92 bpm    Oxygen Saturation (Admit) 94 % 96 % 96 % 95 %    Oxygen Saturation (Exercise) 93 % 95 % 91 % 95 %    Oxygen Saturation (Exit) 95 % 95 % 96 % 96 %    Rating of Perceived Exertion (Exercise) 11 12 15 13     Perceived Dyspnea (Exercise) 0 1 1 0    Symptoms burning in legs none none none     Comments Results First two days of exercise -- --    Duration -- Continue with 30 min of aerobic exercise without signs/symptoms of physical distress. Continue with 30 min of aerobic exercise without signs/symptoms of physical distress. Continue with 30 min of aerobic exercise without signs/symptoms of physical distress.    Intensity -- THRR unchanged THRR unchanged THRR unchanged      Progression   Progression -- Continue to progress workloads to maintain intensity without signs/symptoms of physical distress. Continue to progress workloads to maintain intensity without signs/symptoms of physical distress. Continue to progress workloads to maintain intensity without signs/symptoms of physical distress.    Average METs -- 2.03 2.3 2.9      Resistance Training   Training Prescription -- Yes Yes Yes    Weight -- 3 lb 3 lb 3 lb    Reps -- 10-15 10-15 10-15      Interval Training   Interval Training -- No No No      Treadmill   MPH -- 1.3 1.5 1.5    Grade -- 0.5 3 5     Minutes -- 15 15 15     METs -- 2.08 2.77 3.18      Recumbant Bike   Level -- -- 1.5 --    Watts -- -- 19 --    Minutes -- -- 15 --    METs -- -- 2.45 --      NuStep   Level -- 3 3 3     Minutes -- 15 15 15     METs -- 2 2.4 2.3      REL-XR   Level -- -- -- 2    Minutes -- -- -- 15      T5 Nustep   Level -- -- 2 --    Minutes -- -- 15 --    METs -- -- 2 --      Oxygen   Maintain Oxygen Saturation -- 88% or higher 88% or higher 88% or higher             Exercise Comments:   Exercise Comments     Row Name 10/24/23 1715  Exercise Comments First full day of exercise!  Patient was oriented to gym and equipment including functions, settings, policies, and procedures.  Patient's individual exercise prescription and treatment plan were reviewed.  All starting workloads were established based on the results of the 6 minute walk test done at initial orientation visit.  The plan for exercise  progression was also introduced and progression will be customized based on patient's performance and goals.                Exercise Goals and Review:   Exercise Goals     Row Name 10/22/23 1537             Exercise Goals   Increase Physical Activity Yes       Intervention Provide advice, education, support and counseling about physical activity/exercise needs.;Develop an individualized exercise prescription for aerobic and resistive training based on initial evaluation findings, risk stratification, comorbidities and participant's personal goals.       Expected Outcomes Short Term: Attend rehab on a regular basis to increase amount of physical activity.;Long Term: Exercising regularly at least 3-5 days a week.;Long Term: Add in home exercise to make exercise part of routine and to increase amount of physical activity.       Increase Strength and Stamina Yes       Intervention Provide advice, education, support and counseling about physical activity/exercise needs.;Develop an individualized exercise prescription for aerobic and resistive training based on initial evaluation findings, risk stratification, comorbidities and participant's personal goals.       Expected Outcomes Short Term: Increase workloads from initial exercise prescription for resistance, speed, and METs.;Short Term: Perform resistance training exercises routinely during rehab and add in resistance training at home;Long Term: Improve cardiorespiratory fitness, muscular endurance and strength as measured by increased METs and functional capacity ( )       Able to understand and use rate of perceived exertion (RPE) scale Yes       Intervention Provide education and explanation on how to use RPE scale       Expected Outcomes Short Term: Able to use RPE daily in rehab to express subjective intensity level;Long Term:  Able to use RPE to guide intensity level when exercising independently       Able to understand and use  Dyspnea scale Yes       Intervention Provide education and explanation on how to use Dyspnea scale       Expected Outcomes Short Term: Able to use Dyspnea scale daily in rehab to express subjective sense of shortness of breath during exertion;Long Term: Able to use Dyspnea scale to guide intensity level when exercising independently       Knowledge and understanding of Target Heart Rate Range (THRR) Yes       Intervention Provide education and explanation of THRR including how the numbers were predicted and where they are located for reference       Expected Outcomes Long Term: Able to use THRR to govern intensity when exercising independently;Short Term: Able to state/look up THRR;Short Term: Able to use daily as guideline for intensity in rehab       Able to check pulse independently Yes       Intervention Provide education and demonstration on how to check pulse in carotid and radial arteries.;Review the importance of being able to check your own pulse for safety during independent exercise       Expected Outcomes Short Term: Able to explain why pulse checking is  important during independent exercise;Long Term: Able to check pulse independently and accurately       Understanding of Exercise Prescription Yes       Intervention Provide education, explanation, and written materials on patient's individual exercise prescription       Expected Outcomes Short Term: Able to explain program exercise prescription;Long Term: Able to explain home exercise prescription to exercise independently                Exercise Goals Re-Evaluation :  Exercise Goals Re-Evaluation     Row Name 10/24/23 1715 11/01/23 1128 11/15/23 0848 11/27/23 1524       Exercise Goal Re-Evaluation   Exercise Goals Review Increase Physical Activity;Able to understand and use rate of perceived exertion (RPE) scale;Knowledge and understanding of Target Heart Rate Range (THRR);Understanding of Exercise Prescription;Increase  Strength and Stamina;Able to understand and use Dyspnea scale;Able to check pulse independently Increase Physical Activity;Increase Strength and Stamina;Understanding of Exercise Prescription Increase Physical Activity;Increase Strength and Stamina;Understanding of Exercise Prescription Increase Physical Activity;Increase Strength and Stamina;Understanding of Exercise Prescription    Comments Reviewed RPE and dyspnea scale, THR and program prescription with pt today.  Pt voiced understanding and was given a copy of goals to take home. Heather Norman is off to a good start in the program. She did well on the treadmill at a speed of 1.3 mph with an incline of 0.5% during her first two sessions of rehab. She also did well on the T4 nustep at level 3. We will continue to monitor her progress in the program. Heather Norman is doing well in rehab. She was recently able to increase her incline on the treadmill from 0.5% to 3% while maintaining a speed of 1.64mph. She was also able to begin using the T5 nustep at level 3. We will continue to monitor her progress in the program. Heather Norman continues to do well in rehab. She was recently able to increase her incline on the treadmill from 3% to 5% while maintaining a speed of 1.5 mph. She also began using the XR at level 2 and continues to work at level 3 on the T4 nustep. We will continue to monitor her progress in the program.    Expected Outcomes Short: Use RPE daily to regulate intensity.  Long: Follow program prescription in THR. Short: Continue to follow current exercise prescription. Long: Continue exercise to improve strength and stamina. Short: Continue to increase treadmill workload. Long: Continue exercise to improve strength and stamina. Short: Begin to increase speed on the treadmill. Long: Continue exercise to improve strength and stamina.             Discharge Exercise Prescription (Final Exercise Prescription Changes):  Exercise Prescription Changes - 11/27/23 1500        Response to Exercise   Blood Pressure (Admit) 126/78    Blood Pressure (Exercise) 140/82    Blood Pressure (Exit) 126/62    Heart Rate (Admit) 83 bpm    Heart Rate (Exercise) 115 bpm    Heart Rate (Exit) 92 bpm    Oxygen Saturation (Admit) 95 %    Oxygen Saturation (Exercise) 95 %    Oxygen Saturation (Exit) 96 %    Rating of Perceived Exertion (Exercise) 13    Perceived Dyspnea (Exercise) 0    Symptoms none    Duration Continue with 30 min of aerobic exercise without signs/symptoms of physical distress.    Intensity THRR unchanged      Progression   Progression Continue to progress  workloads to maintain intensity without signs/symptoms of physical distress.    Average METs 2.9      Resistance Training   Training Prescription Yes    Weight 3 lb    Reps 10-15      Interval Training   Interval Training No      Treadmill   MPH 1.5    Grade 5    Minutes 15    METs 3.18      NuStep   Level 3    Minutes 15    METs 2.3      REL-XR   Level 2    Minutes 15      Oxygen   Maintain Oxygen Saturation 88% or higher             Nutrition:  Target Goals: Understanding of nutrition guidelines, daily intake of sodium 1500mg , cholesterol 200mg , calories 30% from fat and 7% or less from saturated fats, daily to have 5 or more servings of fruits and vegetables.  Education: All About Nutrition: -Group instruction provided by verbal, written material, interactive activities, discussions, models, and posters to present general guidelines for heart healthy nutrition including fat, fiber, MyPlate, the role of sodium in heart healthy nutrition, utilization of the nutrition label, and utilization of this knowledge for meal planning. Follow up email sent as well. Written material given at graduation. Flowsheet Row Pulmonary Rehab from 11/28/2023 in [facility]  Education need identified 10/22/23  Date 10/31/23  [part 2]  Educator Efren Grapes  Instruction Review  Code 1- Verbalizes Understanding       Biometrics:  Pre Biometrics - 10/22/23 1537       Pre Biometrics   Height 5' 6.5" (1.689 m)    Weight 290 lb 14.4 oz (132 kg)    Waist Circumference 50.5 inches    Hip Circumference 55 inches    Waist to Hip Ratio 0.92 %    BMI (Calculated) 46.25    Single Leg Stand 3.45 seconds              Nutrition Therapy Plan and Nutrition Goals:  Nutrition Therapy & Goals - 10/22/23 1449       Nutrition Therapy   Protein (specify units) 70-90    Fiber 25 grams    Whole Grain Foods 3 servings    Saturated Fats 15 max. grams    Fruits and Vegetables 5 servings/day    Sodium 2 grams      Personal Nutrition Goals   Nutrition Goal Eat 15-30gProtein and 30-60gCarbs at each meal.    Personal Goal #2 Read labels and reduce sodium intake to below 2300mg . Ideally 1500mg  per day.    Personal Goal #3 Include more colorful produce, aim for 5-8 servings of fruits and veggies per day    Comments Patient drinking less sugary beverages. Has been trying to better control her DM. Discussed some of her meals. Breakfast being oatmeal with cranberries and raisin with a fruit smoothie. Together walked through her breakfast highlighting  her high carb intake and lack of protein. Provided handout on pairing carbs with protein. Educated on carb sources and set goal of eating ~30-60g per meal. Reviewed mediterranean diet handout and educated on types of fats sources and how to read facts labels. She would like to meal prep more and include more veggies at meals.      Intervention Plan   Intervention Nutrition handout(s) given to patient.;Prescribe, educate and counsel regarding individualized specific dietary modifications aiming  towards targeted core components such as weight, hypertension, lipid management, diabetes, heart failure and other comorbidities.    Expected Outcomes Long Term Goal: Adherence to prescribed nutrition plan.;Short Term Goal: A plan has been  developed with personal nutrition goals set during dietitian appointment.;Short Term Goal: Understand basic principles of dietary content, such as calories, fat, sodium, cholesterol and nutrients.             Nutrition Assessments:  MEDIFICTS Score Key: >=70 Need to make dietary changes  40-70 Heart Healthy Diet <= 40 Therapeutic Level Cholesterol Diet  Flowsheet Row Pulmonary Rehab from 10/22/2023 in [facility] Card[facility] and Pulmonary Rehab  Picture Your Plate Total Score on Admission 64      Picture Your Plate Scores: <57 Unhealthy dietary pattern with much room for improvement. 41-50 Dietary pattern unlikely to meet recommendations for good health and room for improvement. 51-60 More healthful dietary pattern, with some room for improvement.  >60 Healthy dietary pattern, although there may be some specific behaviors that could be improved.   Nutrition Goals Re-Evaluation:  Nutrition Goals Re-Evaluation     Row Name 11/22/23 1719             Goals   Comment Patient was informed on why it is important to maintain a balanced diet when dealing with Respiratory issues. Explained that it takes a lot of energy to breath and when they are short of breath often they will need to have a good diet to help keep up with the calories they are expending for breathing.       Expected Outcome Short: Choose and plan snacks accordingly to patients caloric intake to improve breathing. Long: Maintain a diet independently that meets their caloric intake to aid in daily shortness of breath.                Nutrition Goals Discharge (Final Nutrition Goals Re-Evaluation):  Nutrition Goals Re-Evaluation - 11/22/23 1719       Goals   Comment Patient was informed on why it is important to maintain a balanced diet when dealing with Respiratory issues. Explained that it takes a lot of energy to breath and when they are short of breath often they will need to have a good diet to help keep up with the  calories they are expending for breathing.    Expected Outcome Short: Choose and plan snacks accordingly to patients caloric intake to improve breathing. Long: Maintain a diet independently that meets their caloric intake to aid in daily shortness of breath.             Psychosocial: Target Goals: Acknowledge presence or absence of significant depression and/or stress, maximize coping skills, provide positive support system. Participant is able to verbalize types and ability to use techniques and skills needed for reducing stress and depression.   Education: Stress, Anxiety, and Depression - Group verbal and visual presentation to define topics covered.  Reviews how body is impacted by stress, anxiety, and depression.  Also discusses healthy ways to reduce stress and to treat/manage anxiety and depression.  Written material given at graduation.   Education: Sleep Hygiene -Provides group verbal and written instruction about how sleep can affect your health.  Define sleep hygiene, discuss sleep cycles and impact of sleep habits. Review good sleep hygiene tips.    Initial Review & Psychosocial Screening:  Initial Psych Review & Screening - 10/11/23 1256       Initial Review   Current issues with Current Stress Concerns  Source of Stress Concerns Chronic Illness    Comments depression comes and goes does not last   seeing a therapist.      Family Dynamics   Good Support System? Yes   family, Mom     Barriers   Psychosocial barriers to participate in program There are no identifiable barriers or psychosocial needs.      Screening Interventions   Interventions To provide support and resources with identified psychosocial needs;Provide feedback about the scores to participant    Expected Outcomes Short Term goal: Utilizing psychosocial counselor, staff and physician to assist with identification of specific Stressors or current issues interfering with healing process. Setting desired  goal for each stressor or current issue identified.;Long Term Goal: Stressors or current issues are controlled or eliminated.;Short Term goal: Identification and review with participant of any Quality of Life or Depression concerns found by scoring the questionnaire.;Long Term goal: The participant improves quality of Life and PHQ9 Scores as seen by post scores and/or verbalization of changes             Quality of Life Scores:  Scores of 19 and below usually indicate a poorer quality of life in these areas.  A difference of  2-3 points is a clinically meaningful difference.  A difference of 2-3 points in the total score of the Quality of Life Index has been associated with significant improvement in overall quality of life, self-image, physical symptoms, and general health in studies assessing change in quality of life.  PHQ-9: Review Flowsheet  More data exists      10/22/2023 03/14/2022 09/16/2021 04/22/2018 03/18/2018  Depression screen PHQ 2/9  Decreased Interest 1 0 1 0 0  Down, Depressed, Hopeless 1 0 1 0 0  PHQ - 2 Score 2 0 2 0 0  Altered sleeping 0 - 2 - -  Tired, decreased energy 0 - 1 - -  Change in appetite 0 - 1 - -  Feeling bad or failure about yourself  1 - 0 - -  Trouble concentrating 0 - 1 - -  Moving slowly or fidgety/restless 0 - 0 - -  Suicidal thoughts 0 - 0 - -  PHQ-9 Score 3 - 7 - -  Difficult doing work/chores Not difficult at all - Somewhat difficult - -   Interpretation of Total Score  Total Score Depression Severity:  1-4 = Minimal depression, 5-9 = Mild depression, 10-14 = Moderate depression, 15-19 = Moderately severe depression, 20-27 = Severe depression   Psychosocial Evaluation and Intervention:  Psychosocial Evaluation - 10/11/23 1306       Psychosocial Evaluation & Interventions   Interventions Encouraged to exercise with the program and follow exercise prescription    Comments There are no barriers to starting the program.  She has support from  her family, mostly her Mom.  She does see a therapist, on no meds at this time.  Recognizes that she has has had depression at times and it does not last long.  She is ready to learn about managing her weight and improving her lifestyle.    Expected Outcomes STG attend all scheduled sessions, meet with RD  for diabetes and weight loss guidance, and follow the guidance for  exercise progression   LTG Able to maintain her exercise progression, continues to work on her weight loss goals and managing her diabetes    Continue Psychosocial Services  Follow up required by staff  Psychosocial Re-Evaluation:  Psychosocial Re-Evaluation     Row Name 11/22/23 1720             Psychosocial Re-Evaluation   Current issues with Current Depression       Comments Heather Norman meets with her therapist about once a week. She states that it is helping her alot. Her depression stems from anxiety.       Expected Outcomes Short:continue meeting with therapist. Long: maintain a healthy mental state.       Interventions Encouraged to attend Pulmonary Rehabilitation for the exercise       Continue Psychosocial Services  Follow up required by staff                Psychosocial Discharge (Final Psychosocial Re-Evaluation):  Psychosocial Re-Evaluation - 11/22/23 1720       Psychosocial Re-Evaluation   Current issues with Current Depression    Comments Heather Norman meets with her therapist about once a week. She states that it is helping her alot. Her depression stems from anxiety.    Expected Outcomes Short:continue meeting with therapist. Long: maintain a healthy mental state.    Interventions Encouraged to attend Pulmonary Rehabilitation for the exercise    Continue Psychosocial Services  Follow up required by staff             Education: Education Goals: Education classes will be provided on a weekly basis, covering required topics. Participant will state understanding/return demonstration of  topics presented.  Learning Barriers/Preferences:  Learning Barriers/Preferences - 10/11/23 1259       Learning Barriers/Preferences   Learning Barriers Hearing   deaf in left ear,   Learning Preferences None             General Pulmonary Education Topics:  Infection Prevention: - Provides verbal and written material to individual with discussion of infection control including proper hand washing and proper equipment cleaning during exercise session. Flowsheet Row Pulmonary Rehab from 11/28/2023 in [facility] Cardiac and Pulmonary Rehab  Date 10/22/23  Educator NT  Instruction Review Code 1- Verbalizes Understanding       Falls Prevention: - Provides verbal and written material to individual with discussion of falls prevention and safety. Flowsheet Row Pulmonary Rehab from 11/28/2023 in [facility] Cardiac and Pulmonary Rehab  Date 10/11/23  Educator SB  Instruction Review Code 1- Verbalizes Understanding       Chronic Lung Disease Review: - Group verbal instruction with posters, models, PowerPoint presentations and videos,  to review new updates, new respiratory medications, new advancements in procedures and treatments. Providing information on websites and "800" numbers for continued self-education. Includes information about supplement oxygen, available portable oxygen systems, continuous and intermittent flow rates, oxygen safety, concentrators, and Medicare reimbursement for oxygen. Explanation of Pulmonary Drugs, including class, frequency, complications, importance of spacers, rinsing mouth after steroid MDI's, and proper cleaning methods for nebulizers. Review of basic lung anatomy and physiology related to function, structure, and complications of lung disease. Review of risk factors. Discussion about methods for diagnosing sleep apnea and types of masks and machines for OSA. Includes a review of the use of types of environmental controls: home humidity, furnaces, filters, dust  mite/pet prevention, HEPA vacuums. Discussion about weather changes, air quality and the benefits of nasal washing. Instruction on Warning signs, infection symptoms, calling MD promptly, preventive modes, and value of vaccinations. Review of effective airway clearance, coughing and/or vibration techniques. Emphasizing that all should Create an Action Plan. Written material given at graduation. Flowsheet Row Pulmonary Rehab  from 11/28/2023 in [facility] Cardiac and Pulmonary Rehab  Date 11/28/23  Educator [facility]  Instruction Review Code 1- Verbalizes Understanding       AED/CPR: - Group verbal and written instruction with the use of models to demonstrate the basic use of the AED with the basic ABC's of resuscitation.    Anatomy and Cardiac Procedures: - Group verbal and visual presentation and models provide information about basic cardiac anatomy and function. Reviews the testing methods done to diagnose heart disease and the outcomes of the test results. Describes the treatment choices: Medical Management, Angioplasty, or Coronary Bypass Surgery for treating various heart conditions including Myocardial Infarction, Angina, Valve Disease, and Cardiac Arrhythmias.  Written material given at graduation. Flowsheet Row Pulmonary Rehab from 11/28/2023 in [facility] Cardiac and Pulmonary Rehab  Education need identified 10/22/23  Date 11/07/23  Educator SB  Instruction Review Code 1- Verbalizes Understanding       Medication Safety: - Group verbal and visual instruction to review commonly prescribed medications for heart and lung disease. Reviews the medication, class of the drug, and side effects. Includes the steps to properly store meds and maintain the prescription regimen.  Written material given at graduation.   Other: -Provides group and verbal instruction on various topics (see comments)   Knowledge Questionnaire Score:  Knowledge Questionnaire Score - 10/22/23 1533       Knowledge Questionnaire  Score   Pre Score 23/26              Core Components/Risk Factors/Patient Goals at Admission:  Personal Goals and Risk Factors at Admission - 10/11/23 1301       Core Components/Risk Factors/Patient Goals on Admission    Weight Management Yes    Intervention Weight Management: Develop a combined nutrition and exercise program designed to reach desired caloric intake, while maintaining appropriate intake of nutrient and fiber, sodium and fats, and appropriate energy expenditure required for the weight goal.    Admit Weight 288 lb (130.6 kg)    Goal Weight: Short Term 286 lb (129.7 kg)    Goal Weight: Long Term 239 lb (108.4 kg)    Expected Outcomes Short Term: Continue to assess and modify interventions until short term weight is achieved;Long Term: Adherence to nutrition and physical activity/exercise program aimed toward attainment of established weight goal;Weight Loss: Understanding of general recommendations for a balanced deficit meal plan, which promotes 1-2 lb weight loss per week and includes a negative energy balance of [phone] kcal/d    Improve shortness of breath with ADL's Yes    Intervention Provide education, individualized exercise plan and daily activity instruction to help decrease symptoms of SOB with activities of daily living.    Expected Outcomes Short Term: Improve cardiorespiratory fitness to achieve a reduction of symptoms when performing ADLs;Long Term: Be able to perform more ADLs without symptoms or delay the onset of symptoms    Increase knowledge of respiratory medications and ability to use respiratory devices properly  Yes    Intervention Provide education and demonstration as needed of appropriate use of medications, inhalers, and oxygen therapy.    Expected Outcomes Short Term: Achieves understanding of medications use. Understands that oxygen is a medication prescribed by physician. Demonstrates appropriate use of inhaler and oxygen therapy.;Long Term:  Maintain appropriate use of medications, inhalers, and oxygen therapy.    Diabetes Yes    Intervention Provide education about signs/symptoms and action to take for hypo/hyperglycemia.;Provide education about proper nutrition, including hydration, and aerobic/resistive exercise prescription along with  prescribed medications to achieve blood glucose in normal ranges: Fasting glucose 65-99 mg/dL    Expected Outcomes Short Term: Participant verbalizes understanding of the signs/symptoms and immediate care of hyper/hypoglycemia, proper foot care and importance of medication, aerobic/resistive exercise and nutrition plan for blood glucose control.;Long Term: Attainment of HbA1C < 7%.    Heart Failure Yes    Intervention Provide a combined exercise and nutrition program that is supplemented with education, support and counseling about heart failure. Directed toward relieving symptoms such as shortness of breath, decreased exercise tolerance, and extremity edema.    Expected Outcomes Improve functional capacity of life;Short term: Attendance in program 2-3 days a week with increased exercise capacity. Reported lower sodium intake. Reported increased fruit and vegetable intake. Reports medication compliance.;Short term: Daily weights obtained and reported for increase. Utilizing diuretic protocols set by physician.;Long term: Adoption of self-care skills and reduction of barriers for early signs and symptoms recognition and intervention leading to self-care maintenance.    Hypertension Yes    Intervention Provide education on lifestyle modifcations including regular physical activity/exercise, weight management, moderate sodium restriction and increased consumption of fresh fruit, vegetables, and low fat dairy, alcohol moderation, and smoking cessation.;Monitor prescription use compliance.    Expected Outcomes Short Term: Continued assessment and intervention until BP is < 140/66mm HG in hypertensive participants.  < 130/92mm HG in hypertensive participants with diabetes, heart failure or chronic kidney disease.;Long Term: Maintenance of blood pressure at goal levels.    Lipids Yes    Intervention Provide education and support for participant on nutrition & aerobic/resistive exercise along with prescribed medications to achieve LDL 70mg , HDL >40mg .    Expected Outcomes Short Term: Participant states understanding of desired cholesterol values and is compliant with medications prescribed. Participant is following exercise prescription and nutrition guidelines.;Long Term: Cholesterol controlled with medications as prescribed, with individualized exercise RX and with personalized nutrition plan. Value goals: LDL < 70mg , HDL > 40 mg.             Education:Diabetes - Individual verbal and written instruction to review signs/symptoms of diabetes, desired ranges of glucose level fasting, after meals and with exercise. Acknowledge that pre and post exercise glucose checks will be done for 3 sessions at entry of program. Flowsheet Row Pulmonary Rehab from 11/28/2023 in [facility] Cardiac and Pulmonary Rehab  Date 10/22/23  Educator NT  Instruction Review Code 1- Verbalizes Understanding       Know Your Numbers and Heart Failure: - Group verbal and visual instruction to discuss disease risk factors for cardiac and pulmonary disease and treatment options.  Reviews associated critical values for Overweight/Obesity, Hypertension, Cholesterol, and Diabetes.  Discusses basics of heart failure: signs/symptoms and treatments.  Introduces Heart Failure Zone chart for action plan for heart failure.  Written material given at graduation. Flowsheet Row Pulmonary Rehab from 11/28/2023 in [facility] Cardiac and Pulmonary Rehab  Date 11/21/23  Educator SB  Instruction Review Code 1- Verbalizes Understanding       Core Components/Risk Factors/Patient Goals Review:   Goals and Risk Factor Review     Row Name 11/22/23 1718              Core Components/Risk Factors/Patient Goals Review   Personal Goals Review Improve shortness of breath with ADL's       Review Spoke to patient about their shortness of breath and what they can do to improve. Patient has been informed of breathing techniques when starting the program. Patient is informed to tell staff  if they have had any med changes and that certain meds they are taking or not taking can be causing shortness of breath.       Expected Outcomes Short: Attend [facility] regularly to improve shortness of breath with ADL's. Long: maintain independence with ADL's                Core Components/Risk Factors/Patient Goals at Discharge (Final Review):   Goals and Risk Factor Review - 11/22/23 1718       Core Components/Risk Factors/Patient Goals Review   Personal Goals Review Improve shortness of breath with ADL's    Review Spoke to patient about their shortness of breath and what they can do to improve. Patient has been informed of breathing techniques when starting the program. Patient is informed to tell staff if they have had any med changes and that certain meds they are taking or not taking can be causing shortness of breath.    Expected Outcomes Short: Attend [facility] regularly to improve shortness of breath with ADL's. Long: maintain independence with ADL's             ITP Comments:  ITP Comments     Row Name 10/11/23 1311 10/22/23 1532 10/24/23 1714 11/07/23 1212 12/05/23 1349   ITP Comments Virtual orientation call completed today. shehas an appointment on Date: 10/22/2023  for EP eval and gym Orientation.  Documentation of diagnosis [facility] be found in [facility] 08/28/2023 . Completed and gym orientation. Initial ITP created and sent for review to Dr. Faud Aleskerov, Medical Director. First full day of exercise!  Patient was oriented to gym and equipment including functions, settings, policies, and procedures.  Patient's individual exercise prescription and  treatment plan were reviewed.  All starting workloads were established based on the results of the 6 minute walk test done at initial orientation visit.  The plan for exercise progression was also introduced and progression will be customized based on patient's performance and goals. 30 Day review completed. Medical Director ITP review done, changes made as directed, and signed approval by Medical Director.    new to program 30 Day review completed. Medical Director ITP review done, changes made as directed, and signed approval by Medical Director.            Comments:

## 2023-12-06 ENCOUNTER — Encounter: Admitting: *Deleted

## 2023-12-06 DIAGNOSIS — I5032 Chronic diastolic (congestive) heart failure: Secondary | ICD-10-CM

## 2023-12-06 NOTE — Progress Notes (Signed)
 Daily Session Note  Patient Details  Name: Heather Norman MRN: [id number] Date of Birth: 23-Feb-1976 Referring Provider:   Flowsheet Row Pulmonary Rehab from 10/22/2023 in [facility] Cardiac and Pulmonary Rehab  Referring Provider Dr. Belva Boyden, MD       Encounter Date: 12/06/2023  Check In:  Session Check In - 12/06/23 1720       Check-In   Supervising physician immediately available to respond to emergencies See telemetry face sheet for immediately available ER MD    Location [facility]-Cardiac & Pulmonary Rehab    Staff Present Sue Em RN,BSN;Joseph [facility] BS, Exercise Physiologist    Virtual Visit No    Medication changes reported     No    Fall or balance concerns reported    No    Warm-up and Cool-down Performed on first and last piece of equipment    Resistance Training Performed Yes    VAD Patient? No    PAD/SET Patient? No      Pain Assessment   Currently in Pain? No/denies                Social History   Tobacco Use  Smoking Status Never  Smokeless Tobacco Never    Goals Met:  Independence with exercise equipment Exercise tolerated well No report of concerns or symptoms today Strength training completed today  Goals Unmet:  Not Applicable  Comments: Pt able to follow exercise prescription today without complaint.  Will continue to monitor for progression.    Dr. Firman Hughes is Medical Director for [facility] Cardiac Rehabilitation.  Dr. Fuad Aleskerov is Medical Director for [facility] Pulmonary Rehabilitation.

## 2023-12-06 NOTE — Progress Notes (Signed)
 Electrophysiology Clinic Note    Date:  12/06/2023  Patient ID:  Heather Norman, Heather Norman 1975/08/20, MRN [id number] PCP:  Evette Hoes, NP  Cardiologist:  Belva Boyden, MD HF Cardiologist: Bensimhon Electrophysiologist: Boyce Byes, MD    Discussed the use of AI scribe software for clinical note transcription with the patient, who gave verbal consent to proceed.   Patient Profile    Chief Complaint: PVC follow up  History of Present Illness: Heather Norman is a 48 y.o. female with PMH notable for PVC, HFpEF, obesity; seen today for Boyce Byes, MD for routine electrophysiology followup.   She last saw Dr. Marven Slimmer 09/2022. Not PVC ablation candidate d/t body habitus, coreg  continued.   On follow-up today, she feels very well. Dr. Bensimhon recently reduced her entresto  and coreg  dosage d/t intermittent dizziness and LH. She has had no further symptoms, but her BP has risen to readings in the 120-130s systolic. She denies chest pain, chest pressure, lower extremity edema, or exercise intolerance.      ROS:  Please see the history of present illness. All other systems are reviewed and otherwise negative.    Physical Exam    VS:  LMP 11/03/2023  BMI: There is no height or weight on file to calculate BMI.  Wt Readings from Last 3 Encounters:  11/21/23 296 lb (134.3 kg)  11/08/23 294 lb 8 oz (133.6 kg)  11/07/23 290 lb (131.5 kg)     GEN- The patient is well appearing, alert and oriented x 3 today.   Lungs- Clear to ausculation bilaterally, normal work of breathing.  Heart- Irregularly irregular rate and rhythm, no murmurs, rubs or gallops Extremities- No peripheral edema, warm, dry    Studies Reviewed   Previous EP, cardiology notes.    EKG is ordered. Personal review of EKG from today shows:          11/21/2023 EKG - SR with trigeminal PVCs, rate 82   TTE, 08/30/2023  1. Left ventricular ejection fraction, by estimation, is 55 to 60%. The left  ventricle has normal function. The left ventricle has no regional wall motion abnormalities. There is mild left ventricular hypertrophy. Left ventricular diastolic parameters are consistent with Grade I diastolic dysfunction (impaired relaxation). The average left ventricular global longitudinal strain is -9.3 %. The  global longitudinal strain is abnormal.   2. Right ventricular systolic function is mildly reduced. The right ventricular size is normal. There is normal pulmonary artery systolic pressure. The estimated right ventricular systolic pressure is 21.6 mmHg.   3. The mitral valve is normal in structure. No evidence of mitral valve regurgitation. No evidence of mitral stenosis.   4. The aortic valve is normal in structure. There is mild calcification of the aortic valve. Aortic valve regurgitation is not visualized. No aortic stenosis is present.   5. The inferior vena cava is normal in size with greater than 50% respiratory variability, suggesting right atrial pressure of 3 mmHg.   R/LHC, 03/28/2023   The left ventricular ejection fraction is 55-65% by visual estimate.   Findings:   Ao = 117/77 (96) LV = 138/18 RA = 5 RV = 33/3 PA = 31/17 (26) PCW = 11 Fick cardiac output/index = 8.4/3.5 Thermo CO/CI = 7.0/3.0 PVR = 1.9 WU FA sat = 96% PA sat = 71%, 73%   Assessment: 1. Normal coronary arteries 2. LVEF 60-65% 3. Mild PAH with high output    Assessment and Plan     #)  frequent PVCs Continues to have elevated PVC burden on today's EKG, not ablation candidate d/t body habitus She is asymptomatic with normal LVEF Will increase coreg  to 25mg  BID, recommend patient check BP regularly over the next 2 weeks and monitor for LH, dizziness. If symptomatic, will reduce coreg  to 18.75 BID   #) HFpEF Appears warm and dry on exam, though difficult d/t body habitus Follows regularly with HF team, has appt in about 6 weeks        Current medicines are reviewed at length with  the patient today.   The patient does not have concerns regarding her medicines.  The following changes were made today:   INCREASE coreg  to 25mg  in AM and PM  Labs/ tests ordered today include:  No orders of the defined types were placed in this encounter.    Disposition: Follow up with Dr. Marven Slimmer or EP APP in 12 months   Signed, Deshawn Skelley, NP  12/06/23  8:41 PM  Electrophysiology [facility]

## 2023-12-07 ENCOUNTER — Ambulatory Visit: Attending: Cardiology | Admitting: Cardiology

## 2023-12-07 VITALS — BP 132/70 | HR 103 | Ht 65.0 in | Wt 294.4 lb

## 2023-12-07 DIAGNOSIS — I5032 Chronic diastolic (congestive) heart failure: Secondary | ICD-10-CM | POA: Diagnosis not present

## 2023-12-07 DIAGNOSIS — I493 Ventricular premature depolarization: Secondary | ICD-10-CM | POA: Diagnosis not present

## 2023-12-10 ENCOUNTER — Encounter: Admitting: *Deleted

## 2023-12-10 DIAGNOSIS — I5032 Chronic diastolic (congestive) heart failure: Secondary | ICD-10-CM

## 2023-12-10 NOTE — Progress Notes (Signed)
 Daily Session Note  Patient Details  Name: Heather Norman MRN: [id number] Date of Birth: 1976/06/10 Referring Provider:   Flowsheet Row Pulmonary Rehab from 10/22/2023 in [facility] Cardiac and Pulmonary Rehab  Referring Provider Dr. Belva Boyden, MD       Encounter Date: 12/10/2023  Check In:  Session Check In - 12/10/23 1122       Check-In   Supervising physician immediately available to respond to emergencies See telemetry face sheet for immediately available ER MD    Location [facility]-Cardiac & Pulmonary Rehab    Staff Present Maud Sorenson, RN, BSN, CCRP;Joseph Hood RCP,RRT,BSRT;Maxon [employer], Exercise Physiologist;Kelly [employer], ACSM CEP, Exercise Physiologist;Margaret Best, MS, Exercise Physiologist    Virtual Visit No    Medication changes reported     Yes    Comments carvedilol  to full tab 2 x a day    Fall or balance concerns reported    No    Warm-up and Cool-down Performed on first and last piece of equipment    Resistance Training Performed Yes    VAD Patient? No    PAD/SET Patient? No      Pain Assessment   Currently in Pain? No/denies                Social History   Tobacco Use  Smoking Status Never  Smokeless Tobacco Never    Goals Met:  Proper associated with RPD/PD & O2 Sat Independence with exercise equipment Exercise tolerated well No report of concerns or symptoms today  Goals Unmet:  Not Applicable  Comments: Pt able to follow exercise prescription today without complaint.  Will continue to monitor for progression.    Dr. Firman Hughes is Medical Director for [facility] Cardiac Rehabilitation.  Dr. Fuad Aleskerov is Medical Director for [facility] Pulmonary Rehabilitation.

## 2023-12-12 ENCOUNTER — Encounter: Admitting: *Deleted

## 2023-12-12 DIAGNOSIS — I5032 Chronic diastolic (congestive) heart failure: Secondary | ICD-10-CM

## 2023-12-12 NOTE — Progress Notes (Signed)
 Daily Session Note  Patient Details  Name: Heather Norman MRN: [id number] Date of Birth: 06-14-1976 Referring Provider:   Flowsheet Row Pulmonary Rehab from 10/22/2023 in [facility] Cardiac and Pulmonary Rehab  Referring Provider Dr. Belva Boyden, MD       Encounter Date: 12/12/2023  Check In:  Session Check In - 12/12/23 1720       Check-In   Supervising physician immediately available to respond to emergencies See telemetry face sheet for immediately available ER MD    Location [facility]-Cardiac & Pulmonary Rehab    Staff Present Sue Em RN,BSN;Kelly Sabra Cramp BS, ACSM CEP, Exercise Physiologist;Joseph Lacinda Pica RCP,RRT,BSRT    Virtual Visit No    Medication changes reported     No    Fall or balance concerns reported    No    Warm-up and Cool-down Performed on first and last piece of equipment    Resistance Training Performed Yes    VAD Patient? No    PAD/SET Patient? No      Pain Assessment   Currently in Pain? No/denies                Social History   Tobacco Use  Smoking Status Never  Smokeless Tobacco Never    Goals Met:  Independence with exercise equipment Exercise tolerated well No report of concerns or symptoms today Strength training completed today  Goals Unmet:  Not Applicable  Comments: Pt able to follow exercise prescription today without complaint.  Will continue to monitor for progression.    Reviewed home exercise with pt today from 5:20 pm to 5:30 pm.  Pt plans to walk for exercise.  Reviewed THR, pulse, RPE, sign and symptoms, pulse oximetery and when to call 911 or MD.  Also discussed weather considerations and indoor options.  Pt voiced understanding.     Dr. Firman Hughes is Medical Director for [facility] Cardiac Rehabilitation.  Dr. Fuad Aleskerov is Medical Director for [facility] Pulmonary Rehabilitation.

## 2023-12-13 ENCOUNTER — Encounter: Admitting: *Deleted

## 2023-12-13 DIAGNOSIS — I5032 Chronic diastolic (congestive) heart failure: Secondary | ICD-10-CM | POA: Diagnosis not present

## 2023-12-13 NOTE — Progress Notes (Signed)
 Daily Session Note  Patient Details  Name: Heather Norman MRN: [id number] Date of Birth: 05-Nov-1975 Referring Provider:   Flowsheet Row Pulmonary Rehab from 10/22/2023 in [facility] Cardiac and Pulmonary Rehab  Referring Provider Dr. Belva Boyden, MD       Encounter Date: 12/13/2023  Check In:  Session Check In - 12/13/23 1709       Check-In   Supervising physician immediately available to respond to emergencies See telemetry face sheet for immediately available ER MD    Location [facility]-Cardiac & Pulmonary Rehab    Staff Present Sue Em RN,BSN;Joseph [facility] BS, Exercise Physiologist    Virtual Visit No    Medication changes reported     No    Warm-up and Cool-down Performed on first and last piece of equipment    Resistance Training Performed Yes    VAD Patient? No    PAD/SET Patient? No      Pain Assessment   Currently in Pain? No/denies                Social History   Tobacco Use  Smoking Status Never  Smokeless Tobacco Never    Goals Met:  Independence with exercise equipment Exercise tolerated well No report of concerns or symptoms today Strength training completed today  Goals Unmet:  Not Applicable  Comments: Pt able to follow exercise prescription today without complaint.  Will continue to monitor for progression.    Dr. Firman Hughes is Medical Director for [facility] Cardiac Rehabilitation.  Dr. Fuad Aleskerov is Medical Director for [facility] Pulmonary Rehabilitation.

## 2023-12-17 ENCOUNTER — Encounter: Admitting: *Deleted

## 2023-12-17 DIAGNOSIS — I5032 Chronic diastolic (congestive) heart failure: Secondary | ICD-10-CM | POA: Diagnosis not present

## 2023-12-17 NOTE — Progress Notes (Signed)
 Daily Session Note  Patient Details  Name: Heather Norman MRN: [id number] Date of Birth: Aug 02, 1975 Referring Provider:   Flowsheet Row Pulmonary Rehab from 10/22/2023 in [facility] Cardiac and Pulmonary Rehab  Referring Provider Dr. Belva Boyden, MD       Encounter Date: 12/17/2023  Check In:  Session Check In - 12/17/23 1116       Check-In   Supervising physician immediately available to respond to emergencies See telemetry face sheet for immediately available ER MD    Location [facility]-Cardiac & Pulmonary Rehab    Staff Present Maud Sorenson, RN, BSN, CCRP;Maxon Conetta BS, Exercise Physiologist;Kelly [employer], ACSM CEP, Exercise Physiologist    Virtual Visit No    Medication changes reported     No    Fall or balance concerns reported    No    Warm-up and Cool-down Performed on first and last piece of equipment    Resistance Training Performed Yes    VAD Patient? No    PAD/SET Patient? No      Pain Assessment   Currently in Pain? No/denies                Social History   Tobacco Use  Smoking Status Never  Smokeless Tobacco Never    Goals Met:  Proper associated with RPD/PD & O2 Sat Independence with exercise equipment Exercise tolerated well No report of concerns or symptoms today  Goals Unmet:  Not Applicable  Comments: Pt able to follow exercise prescription today without complaint.  Will continue to monitor for progression.    Dr. Firman Hughes is Medical Director for [facility] Cardiac Rehabilitation.  Dr. Fuad Aleskerov is Medical Director for [facility] Pulmonary Rehabilitation.

## 2023-12-19 ENCOUNTER — Encounter

## 2023-12-20 ENCOUNTER — Encounter

## 2023-12-26 ENCOUNTER — Encounter: Admitting: *Deleted

## 2023-12-26 DIAGNOSIS — I5032 Chronic diastolic (congestive) heart failure: Secondary | ICD-10-CM

## 2023-12-26 NOTE — Progress Notes (Signed)
 Daily Session Note  Patient Details  Name: Heather Norman MRN: [id number] Date of Birth: [birth year] Referring Provider:   Flowsheet Row Pulmonary Rehab from 10/22/2023 in [facility] Cardiac and Pulmonary Rehab  Referring Provider Dr. Belva Boyden, MD       Encounter Date: 12/26/2023  Check In:  Session Check In - 12/26/23 1719       Check-In   Supervising physician immediately available to respond to emergencies See telemetry face sheet for immediately available ER MD    Location [facility]-Cardiac & Pulmonary Rehab    Staff Present Sue Em RN,BSN;Joseph [facility] Sabra Cramp BS, ACSM CEP, Exercise Physiologist    Virtual Visit No    Medication changes reported     Yes    Comments increased spirinolactone to twice a day    Warm-up and Cool-down Performed on first and last piece of equipment    Resistance Training Performed Yes    VAD Patient? No    PAD/SET Patient? No      Pain Assessment   Currently in Pain? No/denies                Social History   Tobacco Use  Smoking Status Never  Smokeless Tobacco Never    Goals Met:  Independence with exercise equipment Exercise tolerated well Personal goals reviewed No report of concerns or symptoms today Strength training completed today  Goals Unmet:  Not Applicable  Comments: Pt able to follow exercise prescription today without complaint.  Will continue to monitor for progression.    Dr. Firman Hughes is Medical Director for [facility] Cardiac Rehabilitation.  Dr. Fuad Aleskerov is Medical Director for [facility] Pulmonary Rehabilitation.

## 2023-12-27 ENCOUNTER — Encounter: Admitting: *Deleted

## 2023-12-27 DIAGNOSIS — I5032 Chronic diastolic (congestive) heart failure: Secondary | ICD-10-CM

## 2023-12-27 NOTE — Progress Notes (Signed)
 Daily Session Note  Patient Details  Name: Heather Norman MRN: [id number] Date of Birth: [birth year] Referring Provider:   Flowsheet Row Pulmonary Rehab from 10/22/2023 in [facility] Cardiac and Pulmonary Rehab  Referring Provider Dr. Belva Boyden, MD       Encounter Date: 12/27/2023  Check In:  Session Check In - 12/27/23 1731       Check-In   Supervising physician immediately available to respond to emergencies See telemetry face sheet for immediately available ER MD    Location [facility]-Cardiac & Pulmonary Rehab    Staff Present Maud Sorenson, RN, BSN, CCRP;Joseph Hood RCP,RRT,BSRT;Maxon [place] BS, Exercise Physiologist    Virtual Visit No    Medication changes reported     No    Fall or balance concerns reported    No    Warm-up and Cool-down Performed on first and last piece of equipment    Resistance Training Performed Yes    VAD Patient? No    PAD/SET Patient? No      Pain Assessment   Currently in Pain? No/denies                Social History   Tobacco Use  Smoking Status Never  Smokeless Tobacco Never    Goals Met:  Proper associated with RPD/PD & O2 Sat Independence with exercise equipment Exercise tolerated well No report of concerns or symptoms today  Goals Unmet:  Not Applicable  Comments: Pt able to follow exercise prescription today without complaint.  Will continue to monitor for progression.    Dr. Firman Hughes is Medical Director for [facility] Cardiac Rehabilitation.  Dr. Fuad Aleskerov is Medical Director for [facility] Pulmonary Rehabilitation.

## 2023-12-28 ENCOUNTER — Telehealth: Payer: Self-pay | Admitting: Internal Medicine

## 2023-12-28 MED ORDER — SACUBITRIL-VALSARTAN 49-51 MG PO TABS: 1.0000 | ORAL_TABLET | Freq: Two times a day (BID) | ORAL | 3 refills | Status: DC

## 2023-12-28 NOTE — Progress Notes (Signed)
 Pt called requesting refills on Entresto . Medication sent to [facility] pharmacy on [address].

## 2023-12-29 ENCOUNTER — Other Ambulatory Visit: Payer: Self-pay | Admitting: Family

## 2023-12-31 ENCOUNTER — Encounter: Attending: Cardiovascular Disease

## 2023-12-31 DIAGNOSIS — I5032 Chronic diastolic (congestive) heart failure: Secondary | ICD-10-CM | POA: Diagnosis present

## 2023-12-31 NOTE — Progress Notes (Signed)
 Daily Session Note  Patient Details  Name: Heather Norman MRN: [id number] Date of Birth: [birth year] Referring Provider:   Flowsheet Row Pulmonary Rehab from 10/22/2023 in [facility] Cardiac and Pulmonary Rehab  Referring Provider Dr. Belva Boyden, MD       Encounter Date: 12/31/2023  Check In:  Session Check In - 12/31/23 1103       Check-In   Supervising physician immediately available to respond to emergencies See telemetry face sheet for immediately available ER MD    Location [facility]-Cardiac & Pulmonary Rehab    Staff Present Lyell Samuel, MS, Exercise Physiologist;Pura Picinich Dawne Euler, ACSM CEP, Exercise Physiologist;Ciria Bernardini RN,BSN,MPA;Joseph Hood RCP,RRT,BSRT    Virtual Visit No    Medication changes reported     No    Fall or balance concerns reported    No    Warm-up and Cool-down Performed on first and last piece of equipment    Resistance Training Performed Yes    VAD Patient? No    PAD/SET Patient? No      Pain Assessment   Currently in Pain? No/denies                Social History   Tobacco Use  Smoking Status Never  Smokeless Tobacco Never    Goals Met:  Independence with exercise equipment Exercise tolerated well No report of concerns or symptoms today Strength training completed today  Goals Unmet:  Not Applicable  Comments: Pt able to follow exercise prescription today without complaint.  Will continue to monitor for progression.    Dr. Firman Hughes is Medical Director for [facility] Cardiac Rehabilitation.  Dr. Fuad Aleskerov is Medical Director for [facility] Pulmonary Rehabilitation.

## 2024-01-02 ENCOUNTER — Other Ambulatory Visit: Payer: Self-pay

## 2024-01-02 ENCOUNTER — Encounter: Payer: Self-pay | Admitting: *Deleted

## 2024-01-02 ENCOUNTER — Encounter

## 2024-01-02 DIAGNOSIS — G4733 Obstructive sleep apnea (adult) (pediatric): Secondary | ICD-10-CM

## 2024-01-02 DIAGNOSIS — I5032 Chronic diastolic (congestive) heart failure: Secondary | ICD-10-CM

## 2024-01-02 NOTE — Progress Notes (Signed)
 Pulmonary Individual Treatment Plan  Patient Details  Name: Heather Norman MRN: [id number] Date of Birth: 10/26/1975 Referring Provider:   Flowsheet Row Pulmonary Rehab from 10/22/2023 in [facility] Cardiac and Pulmonary Rehab  Referring Provider Dr. Belva Boyden, MD       Initial Encounter Date:  Flowsheet Row Pulmonary Rehab from 10/22/2023 in [facility] Cardiac and Pulmonary Rehab  Date 10/22/23       Visit Diagnosis: Heart failure, diastolic, chronic (HCC)  Patient's Home Medications on Admission:  Current Outpatient Medications:    albuterol  (VENTOLIN  HFA) 108 (90 Base) MCG/ACT inhaler, Inhale 1-2 puffs into the lungs every 6 (six) hours as needed., Disp: 18 g, Rfl: 0   aluminum-magnesium hydroxide 200-200 MG/5ML suspension, Take 15 mLs by mouth every 6 (six) hours as needed for indigestion., Disp: , Rfl:    aspirin  EC 81 MG tablet, Take 81 mg by mouth daily., Disp: , Rfl:    atorvastatin  (LIPITOR) 20 MG tablet, TAKE 1 TABLET(20 MG) BY MOUTH DAILY, Disp: 90 tablet, Rfl: 3   carvedilol  (COREG ) 25 MG tablet, Take 0.5 tablets (12.5 mg total) by mouth 2 (two) times daily with a meal., Disp: 225 tablet, Rfl: 3   cetirizine (ZYRTEC) 10 MG tablet, Take 10 mg by mouth daily., Disp: , Rfl:    Elagolix Sodium  (ORILISSA ) 150 MG TABS, Take 1 tablet (150 mg total) by mouth daily., Disp: 30 tablet, Rfl: 2   empagliflozin  (JARDIANCE ) 25 MG TABS tablet, Take 1 tablet (25 mg total) by mouth daily before breakfast., Disp: 30 tablet, Rfl: 6   famotidine  (PEPCID ) 20 MG tablet, Take 1 tablet (20 mg total) by mouth 2 (two) times daily., Disp: , Rfl:    famotidine -calcium  carbonate-magnesium hydroxide (PEPCID  COMPLETE) 10-800-165 MG chewable tablet, Chew 1 tablet by mouth daily as needed., Disp: , Rfl:    fluticasone  (FLONASE ) 50 MCG/ACT nasal spray, Place 2 sprays into both nostrils daily., Disp: , Rfl:    latanoprost  (XALATAN ) 0.005 % ophthalmic solution, Place 1 drop into both eyes at bedtime., Disp: , Rfl:     meclizine  (ANTIVERT ) 25 MG tablet, Take 1 tablet (25 mg total) by mouth 3 (three) times daily as needed for dizziness or nausea., Disp: 30 tablet, Rfl: 1   metolazone  (ZAROXOLYN ) 2.5 MG tablet, Take 1 tablet (2.5 mg total) by mouth once a week., Disp: , Rfl:    mexiletine (MEXITIL ) 150 MG capsule, Take 2 capsules (300 mg total) by mouth 2 (two) times daily., Disp: 360 capsule, Rfl: 3   pantoprazole  (PROTONIX ) 40 MG tablet, Take 1 tablet (40 mg total) by mouth daily., Disp: 90 tablet, Rfl: 3   potassium chloride  20 MEQ/15ML (10%) SOLN, Take 60 mLs (80 mEq total) by mouth daily. And additional 60meq ( ) on the days you take the metolazone , Disp: 473 mL, Rfl: 6   sacubitril -valsartan  (ENTRESTO ) 49-51 MG, Take 1 tablet by mouth 2 (two) times daily., Disp: 180 tablet, Rfl: 3   Semaglutide ,0.25 or 0.5MG /DOS, (OZEMPIC , 0.25 OR 0.5 MG/DOSE,) 2 MG/3ML SOPN, Inject 0.5 mg into the skin once a week., Disp: 3 mL, Rfl: 1   spironolactone  (ALDACTONE ) 25 MG tablet, TAKE 1 TABLET(25 MG) BY MOUTH DAILY, Disp: 90 tablet, Rfl: 3   torsemide  (DEMADEX ) 20 MG tablet, Take 4 tablets (80 mg total) by mouth daily. (Patient taking differently: Take 40 mg by mouth 2 (two) times daily. Take 60MG  in the morning and 40 MG at night), Disp: 180 tablet, Rfl: 6  Past Medical History: Past Medical History:  Diagnosis Date   (HFpEF) heart failure with preserved ejection fraction (HCC)    a. 04/2017 Echo: EF 55-60%, no rwma, Mild MR, mildly dil LA. Nl RV fxn; b. 06/2019 Echo: EF 60-65%, mod LVH. Gr2 DD. Mildly dil LA; c. 03/2023 Cath: Nl cors. mild PAH (PA 31/17 (26) w/ high output (CO/CI 8.4/3.5); d. 08/2023 Echo: EF 55-60%, no rwma, GrI DD, mildly reduced RV fxn, RVSP 21.6 mmHg. No significant valvular dzs.   Arthritis    kness/hands - no meds   Diabetes mellitus without complication (HCC)    DM (diabetes mellitus) (HCC) 05/11/2017   Dyspnea on exertion    Headache(784.0)    otc med prn   Heart murmur    History of  cardiac cath    a. 03/2023 Cath: Nl cors.   Hyperlipidemia    no meds since pregnancy   Hypertension    Morbid obesity (HCC)    NSVT (nonsustained ventricular tachycardia) (HCC)    a. 07/2022 Zio: 44 runs NSVT, longest 8 beats, fastest 182. 3 brief SVT runs.  21.3% PVC burden.   PVC (premature ventricular contraction)    a. 07/2022 [facility]: 21.3 PVCs, 9.6% couplets, 3.5% triplets, 44 brief runs of NSVT.    Tobacco Use: Social History   Tobacco Use  Smoking Status Never  Smokeless Tobacco Never    Labs: Review Flowsheet  More data may exist      Latest Ref Rng & Units 12/13/2012 01/14/2015 06/05/2020 03/28/2023 08/22/2023  Labs for ITP Cardiac and Pulmonary Rehab  Cholestrol 0 - 200 mg/dL 409  811  914  - -  LDL (calc) 0 - 99 mg/dL 782  956  213  - -  HDL-C >40 mg/dL 52  42  48  - -  Trlycerides <150 mg/dL 84  086  578  - -  Hemoglobin A1c 4.8 - 5.6 % 6.6  - - - 8.5   PH, Arterial 7.35 - 7.45 - - - 7.372  -  PCO2 arterial 32 - 48 mmHg - - - 38.3  -  Bicarbonate 20.0 - 28.0 mmol/L - - - 18.9  24.8  22.2  -  TCO2 22 - 32 mmol/L - - - 20  26  23   -  Acid-base deficit 0.0 - 2.0 mmol/L - - - 7.0  1.0  3.0  -  O2 Saturation % - - - 71  73  96  -    Details       Multiple values from one day are sorted in reverse-chronological order          Pulmonary Assessment Scores:  Pulmonary Assessment Scores     Row Name 10/22/23 1532         ADL UCSD   ADL Phase Entry     SOB Score total 20     Rest 0     Walk 0     Stairs 4     Bath 0     Dress 0     Shop 0       CAT Score   CAT Score 20       mMRC Score   mMRC Score 2              UCSD: Self-administered rating of dyspnea associated with activities of daily living (ADLs) 6-point scale (0 = "not at all" to 5 = "maximal or unable to do because of breathlessness")  Scoring Scores range from 0 to 120.  Minimally important  difference is 5 units  CAT: CAT can identify the health impairment of COPD patients and is  better correlated with disease progression.  CAT has a scoring range of zero to 40. The CAT score is classified into four groups of low (less than 10), medium (10 - 20), high (21-30) and very high (31-40) based on the impact level of disease on health status. A CAT score over 10 suggests significant symptoms.  A worsening CAT score could be explained by an exacerbation, poor medication adherence, poor inhaler technique, or progression of COPD or comorbid conditions.  CAT MCID is 2 points  mMRC: mMRC (Modified Medical Research Council) Dyspnea Scale is used to assess the degree of baseline functional disability in patients of respiratory disease due to dyspnea. No minimal important difference is established. A decrease in score of 1 point or greater is considered a positive change.   Pulmonary Function Assessment:   Exercise Target Goals: Exercise Program Goal: Individual exercise prescription set using results from initial 6 min walk test and THRR while considering  patient's activity barriers and safety.   Exercise Prescription Goal: Initial exercise prescription builds to 30-45 minutes a day of aerobic activity, 2-3 days per week.  Home exercise guidelines will be given to patient during program as part of exercise prescription that the participant will acknowledge.  Education: Aerobic Exercise: - Group verbal and visual presentation on the components of exercise prescription. Introduces F.I.T.T principle from ACSM for exercise prescriptions.  Reviews F.I.T.T. principles of aerobic exercise including progression. Written material given at graduation. Flowsheet Row Pulmonary Rehab from 12/26/2023 in [facility] Cardiac and Pulmonary Rehab  Education need identified 10/22/23  Date 12/26/23  Educator [facility]  Instruction Review Code 1- [employer] Understanding       Education: Resistance Exercise: - Group verbal and visual presentation on the components of exercise prescription. Introduces F.I.T.T  principle from ACSM for exercise prescriptions  Reviews F.I.T.T. principles of resistance exercise including progression. Written material given at graduation.    Education: Exercise & Equipment Safety: - Individual verbal instruction and demonstration of equipment use and safety with use of the equipment. Flowsheet Row Pulmonary Rehab from 12/26/2023 in [facility] Cardiac and Pulmonary Rehab  Date 10/22/23  Educator NT  Instruction Review Code 1- Verbalizes Understanding       Education: Exercise Physiology & General Exercise Guidelines: - Group verbal and written instruction with models to review the exercise physiology of the cardiovascular system and associated critical values. Provides general exercise guidelines with specific guidelines to those with heart or lung disease.  Flowsheet Row Pulmonary Rehab from 12/26/2023 in [facility] Cardiac and Pulmonary Rehab  Date 12/12/23  Educator [facility]  Instruction Review Code 1- [employer] Understanding       Education: Flexibility, Balance, Mind/Body Relaxation: - Group verbal and visual presentation with interactive activity on the components of exercise prescription. Introduces F.I.T.T principle from ACSM for exercise prescriptions. Reviews F.I.T.T. principles of flexibility and balance exercise training including progression. Also discusses the mind body connection.  Reviews various relaxation techniques to help reduce and manage stress (i.e. Deep breathing, progressive muscle relaxation, and visualization). Balance handout provided to take home. Written material given at graduation.   Activity Barriers & Risk Stratification:  Activity Barriers & Cardiac Risk Stratification - 10/22/23 1536       Activity Barriers & Cardiac Risk Stratification   Activity Barriers Shortness of Breath;Balance Concerns   Vertigo            6 Minute Walk:  6  Minute Walk     Row Name 10/22/23 1534         6 Minute Walk   Phase Initial     Distance 940 feet      Walk Time 6 minutes     # of Rest Breaks 0     MPH 1.78     METS 2.46     RPE 11     Perceived Dyspnea  0     VO2 Peak 8.6     Symptoms Yes (comment)     Comments burning in legs     Resting HR 58 bpm     Resting BP 112/72     Resting Oxygen Saturation  94 %     Exercise Oxygen Saturation  during 6 min walk 93 %     Max Ex. HR 119 bpm     Max Ex. BP 124/76     2 Minute Post BP 106/70       Interval HR   1 Minute HR 111     2 Minute HR 101     3 Minute HR 108     4 Minute HR 84     5 Minute HR 108     6 Minute HR 119     2 Minute Post HR 78     Interval Heart Rate? Yes       Interval Oxygen   Interval Oxygen? Yes     Baseline Oxygen Saturation % 94 %     1 Minute Oxygen Saturation % 93 %     1 Minute Liters of Oxygen 0 L  RA     2 Minute Oxygen Saturation % 94 %     2 Minute Liters of Oxygen 0 L     3 Minute Oxygen Saturation % 93 %     3 Minute Liters of Oxygen 0 L     4 Minute Oxygen Saturation % 95 %     4 Minute Liters of Oxygen 0 L     5 Minute Oxygen Saturation % 94 %     5 Minute Liters of Oxygen 0 L     6 Minute Oxygen Saturation % 94 %     6 Minute Liters of Oxygen 0 L     2 Minute Post Oxygen Saturation % 95 %     2 Minute Post Liters of Oxygen 0 L             Oxygen Initial Assessment:  Oxygen Initial Assessment - 10/11/23 1251       Home Oxygen   Home Oxygen Device None    Sleep Oxygen Prescription None    Home Exercise Oxygen Prescription None    Home Resting Oxygen Prescription None    Compliance with Home Oxygen Use Yes      Intervention   Short Term Goals To learn and demonstrate proper use of respiratory medications;To learn and demonstrate proper pursed lip breathing techniques or other breathing techniques.     Long  Term Goals Demonstrates proper use of MDI's;Compliance with respiratory medication;Exhibits proper breathing techniques, such as pursed lip breathing or other method taught during program session              Oxygen Re-Evaluation:  Oxygen Re-Evaluation     Row Name 10/24/23 1716 11/22/23 1715 12/26/23 1739         Program Oxygen Prescription   Program Oxygen Prescription -- None None  Home Oxygen   Home Oxygen Device -- None None     Sleep Oxygen Prescription -- CPAP CPAP     Liters per minute -- 0 0     Home Exercise Oxygen Prescription -- None None     Home Resting Oxygen Prescription -- None None     Compliance with Home Oxygen Use -- Yes Yes       Goals/Expected Outcomes   Short Term Goals To learn and demonstrate proper use of respiratory medications;To learn and demonstrate proper pursed lip breathing techniques or other breathing techniques.  Other --     Long  Term Goals Demonstrates proper use of MDI's;Compliance with respiratory medication;Exhibits proper breathing techniques, such as pursed lip breathing or other method taught during program session Other --     Comments Reviewed PLB technique with pt.  Talked about how it works and it's importance in maintaining their exercise saturations. Abbygayle is having a sleep study tonight. She should find out the results in the next week or so. She will informed staff of any changes to her sleep study. She is wearing her CPAP now and this a sleep study to get an update on her sleep apnea. Lianny did have her sleep study and no new results were round. No changes were made to her CPAP, and she continues to use it with no problems.     Goals/Expected Outcomes Short: Become more profiecient at using PLB.   Long: Become independent at using PLB. Short: do sleep study. Long: wear CPAP/BiPAP routinely. Short: continue to wear CPAP. Long: monitor for breathing changes and inform doctor of any concerns.              Oxygen Discharge (Final Oxygen Re-Evaluation):  Oxygen Re-Evaluation - 12/26/23 1739       Program Oxygen Prescription   Program Oxygen Prescription None      Home Oxygen   Home Oxygen Device None    Sleep Oxygen  Prescription CPAP    Liters per minute 0    Home Exercise Oxygen Prescription None    Home Resting Oxygen Prescription None    Compliance with Home Oxygen Use Yes      Goals/Expected Outcomes   Comments Mozell did have her sleep study and no new results were round. No changes were made to her CPAP, and she continues to use it with no problems.    Goals/Expected Outcomes Short: continue to wear CPAP. Long: monitor for breathing changes and inform doctor of any concerns.             Initial Exercise Prescription:  Initial Exercise Prescription - 10/22/23 1500       Date of Initial Exercise RX and Referring Provider   Date 10/22/23    Referring Provider Dr. Timothy Gollan, MD      Oxygen   Maintain Oxygen Saturation 88% or higher      Treadmill   MPH 1.8    Grade 0.5    Minutes 15    METs 2.5      Recumbant Bike   Level 1    RPM 50    Watts 19    Minutes 15    METs 2.46      NuStep   Level 2    SPM 80    Minutes 15    METs 2.46      Prescription Details   Frequency (times per week) 3    Duration Progress to 30 minutes of continuous aerobic  without signs/symptoms of physical distress      Intensity   THRR 40-80% of Max Heartrate 103-148    Ratings of Perceived Exertion 11-13    Perceived Dyspnea 0-4      Progression   Progression Continue to progress workloads to maintain intensity without signs/symptoms of physical distress.      Resistance Training   Training Prescription Yes    Weight 3 lb    Reps 10-15             Perform Capillary Blood Glucose checks as needed.  Exercise Prescription Changes:   Exercise Prescription Changes     Row Name 10/22/23 1500 11/01/23 1100 11/15/23 0800 11/27/23 1500 12/12/23 1300     Response to Exercise   Blood Pressure (Admit) 112/72 122/64 118/60 126/78 112/70   Blood Pressure (Exercise) 124/76 150/68 150/74 140/82 132/78   Blood Pressure (Exit) 106/70 110/70 104/68 126/62 106/58   Heart Rate (Admit) 58  bpm 77 bpm 65 bpm 83 bpm 67 bpm   Heart Rate (Exercise) 119 bpm 108 bpm 107 bpm 115 bpm 123 bpm   Heart Rate (Exit) 78 bpm 88 bpm 71 bpm 92 bpm 70 bpm   Oxygen Saturation (Admit) 94 % 96 % 96 % 95 % 96 %   Oxygen Saturation (Exercise) 93 % 95 % 91 % 95 % 94 %   Oxygen Saturation (Exit) 95 % 95 % 96 % 96 % 96 %   Rating of Perceived Exertion (Exercise) 11 12 15 13 15    Perceived Dyspnea (Exercise) 0 1 1 0 2   Symptoms burning in legs none none none none   Comments Results First two days of exercise -- -- --   Duration -- Continue with 30 min of aerobic exercise without signs/symptoms of physical distress. Continue with 30 min of aerobic exercise without signs/symptoms of physical distress. Continue with 30 min of aerobic exercise without signs/symptoms of physical distress. Continue with 30 min of aerobic exercise without signs/symptoms of physical distress.   Intensity -- THRR unchanged THRR unchanged THRR unchanged THRR unchanged     Progression   Progression -- Continue to progress workloads to maintain intensity without signs/symptoms of physical distress. Continue to progress workloads to maintain intensity without signs/symptoms of physical distress. Continue to progress workloads to maintain intensity without signs/symptoms of physical distress. Continue to progress workloads to maintain intensity without signs/symptoms of physical distress.   Average METs -- 2.03 2.3 2.9 2.64     Resistance Training   Training Prescription -- Yes Yes Yes Yes   Weight -- 3 lb 3 lb 3 lb 3 lb   Reps -- 10-15 10-15 10-15 10-15     Interval Training   Interval Training -- No No No No     Treadmill   MPH -- 1.3 1.5 1.5 1.6   Grade -- 0.5 3 5 5    Minutes -- 15 15 15 15    METs -- 2.08 2.77 3.18 3.33     Recumbant Bike   Level -- -- 1.5 -- 1.5   Watts -- -- 19 -- 19   Minutes -- -- 15 -- 15   METs -- -- 2.45 -- 2.44     NuStep   Level -- 3 3 3 4    Minutes -- 15 15 15 15    METs -- 2 2.4 2.3  2.5     REL-XR   Level -- -- -- 2 2   Minutes -- -- -- 15 15  METs -- -- -- -- 1.6     T5 Nustep   Level -- -- 2 -- 2   Minutes -- -- 15 -- 15   METs -- -- 2 -- 2     Oxygen   Maintain Oxygen Saturation -- 88% or higher 88% or higher 88% or higher 88% or higher    Row Name 12/12/23 1700 12/27/23 1800           Response to Exercise   Blood Pressure (Admit) -- 112/62      Blood Pressure (Exercise) -- 140/80      Blood Pressure (Exit) -- 108/68      Heart Rate (Admit) -- 83 bpm      Heart Rate (Exercise) -- 104 bpm      Heart Rate (Exit) -- 73 bpm      Oxygen Saturation (Admit) -- 98 %      Oxygen Saturation (Exercise) -- 94 %      Oxygen Saturation (Exit) -- 95 %      Rating of Perceived Exertion (Exercise) -- 12      Perceived Dyspnea (Exercise) -- 1      Symptoms -- none      Duration Continue with 30 min of aerobic exercise without signs/symptoms of physical distress. Continue with 30 min of aerobic exercise without signs/symptoms of physical distress.      Intensity THRR unchanged THRR unchanged        Progression   Progression Continue to progress workloads to maintain intensity without signs/symptoms of physical distress. Continue to progress workloads to maintain intensity without signs/symptoms of physical distress.      Average METs 2.64 3.39        Resistance Training   Training Prescription Yes Yes      Weight 3 lb 3 lb      Reps 10-15 10-15        Interval Training   Interval Training No No        Treadmill   MPH 1.6 4.5      Grade 5 1.5      Minutes 15 15      METs 3.33 5.38        Recumbant Bike   Level 1.5 1.7      Watts 19 19      Minutes 15 15      METs 2.44 2.45        NuStep   Level 4 5      Minutes 15 15      METs 2.5 2.9        REL-XR   Level 2 1      Minutes 15 15      METs 1.6 3.9        T5 Nustep   Level 2 --      Minutes 15 --      METs 2 --        Home Exercise Plan   Plans to continue exercise at Home (comment)   walking Home (comment)  walking      Frequency Add 2 additional days to program exercise sessions. Add 2 additional days to program exercise sessions.      Initial Home Exercises Provided 12/12/23 12/12/23        Oxygen   Maintain Oxygen Saturation 88% or higher 88% or higher               Exercise Comments:   Exercise Comments  Row Name 10/24/23 1715           Exercise Comments First full day of exercise!  Patient was oriented to gym and equipment including functions, settings, policies, and procedures.  Patient's individual exercise prescription and treatment plan were reviewed.  All starting workloads were established based on the results of the 6 minute walk test done at initial orientation visit.  The plan for exercise progression was also introduced and progression will be customized based on patient's performance and goals.                Exercise Goals and Review:   Exercise Goals     Row Name 10/22/23 1537             Exercise Goals   Increase Physical Activity Yes       Intervention Provide advice, education, support and counseling about physical activity/exercise needs.;Develop an individualized exercise prescription for aerobic and resistive training based on initial evaluation findings, risk stratification, comorbidities and participant's personal goals.       Expected Outcomes Short Term: Attend rehab on a regular basis to increase amount of physical activity.;Long Term: Exercising regularly at least 3-5 days a week.;Long Term: Add in home exercise to make exercise part of routine and to increase amount of physical activity.       Increase Strength and Stamina Yes       Intervention Provide advice, education, support and counseling about physical activity/exercise needs.;Develop an individualized exercise prescription for aerobic and resistive training based on initial evaluation findings, risk stratification, comorbidities and participant's personal  goals.       Expected Outcomes Short Term: Increase workloads from initial exercise prescription for resistance, speed, and METs.;Short Term: Perform resistance training exercises routinely during rehab and add in resistance training at home;Long Term: Improve cardiorespiratory fitness, muscular endurance and strength as measured by increased METs and functional capacity ( )       Able to understand and use rate of perceived exertion (RPE) scale Yes       Intervention Provide education and explanation on how to use RPE scale       Expected Outcomes Short Term: Able to use RPE daily in rehab to express subjective intensity level;Long Term:  Able to use RPE to guide intensity level when exercising independently       Able to understand and use Dyspnea scale Yes       Intervention Provide education and explanation on how to use Dyspnea scale       Expected Outcomes Short Term: Able to use Dyspnea scale daily in rehab to express subjective sense of shortness of breath during exertion;Long Term: Able to use Dyspnea scale to guide intensity level when exercising independently       Knowledge and understanding of Target Heart Rate Range (THRR) Yes       Intervention Provide education and explanation of THRR including how the numbers were predicted and where they are located for reference       Expected Outcomes Long Term: Able to use THRR to govern intensity when exercising independently;Short Term: Able to state/look up THRR;Short Term: Able to use daily as guideline for intensity in rehab       Able to check pulse independently Yes       Intervention Provide education and demonstration on how to check pulse in carotid and radial arteries.;Review the importance of being able to check your own pulse for safety during independent exercise  Expected Outcomes Short Term: Able to explain why pulse checking is important during independent exercise;Long Term: Able to check pulse independently and accurately        Understanding of Exercise Prescription Yes       Intervention Provide education, explanation, and written materials on patient's individual exercise prescription       Expected Outcomes Short Term: Able to explain program exercise prescription;Long Term: Able to explain home exercise prescription to exercise independently                Exercise Goals Re-Evaluation :  Exercise Goals Re-Evaluation     Row Name 10/24/23 1715 11/01/23 1128 11/15/23 0848 11/27/23 1524 12/12/23 1339     Exercise Goal Re-Evaluation   Exercise Goals Review Increase Physical Activity;Able to understand and use rate of perceived exertion (RPE) scale;Knowledge and understanding of Target Heart Rate Range (THRR);Understanding of Exercise Prescription;Increase Strength and Stamina;Able to understand and use Dyspnea scale;Able to check pulse independently Increase Physical Activity;Increase Strength and Stamina;Understanding of Exercise Prescription Increase Physical Activity;Increase Strength and Stamina;Understanding of Exercise Prescription Increase Physical Activity;Increase Strength and Stamina;Understanding of Exercise Prescription Increase Physical Activity;Increase Strength and Stamina;Understanding of Exercise Prescription   Comments Reviewed RPE and dyspnea scale, THR and program prescription with pt today.  Pt voiced understanding and was given a copy of goals to take home. Deklyn is off to a good start in the program. She did well on the treadmill at a speed of 1.3 mph with an incline of 0.5% during her first two sessions of rehab. She also did well on the T4 nustep at level 3. We will continue to monitor her progress in the program. Promyse is doing well in rehab. She was recently able to increase her incline on the treadmill from 0.5% to 3% while maintaining a speed of 1.17mph. She was also able to begin using the T5 nustep at level 3. We will continue to monitor her progress in the program. Zemirah continues  to do well in rehab. She was recently able to increase her incline on the treadmill from 3% to 5% while maintaining a speed of 1.5 mph. She also began using the XR at level 2 and continues to work at level 3 on the T4 nustep. We will continue to monitor her progress in the program. Norberta is doing well in rehab. She was able to increase her speed on the treadmill from 1.5 to 1. while maintaining 5% incline. She was also able to increase her level on the T4 nustep from 3 to 4. We will continue to monitor her progress in the program.   Expected Outcomes Short: Use RPE daily to regulate intensity.  Long: Follow program prescription in THR. Short: Continue to follow current exercise prescription. Long: Continue exercise to improve strength and stamina. Short: Continue to increase treadmill workload. Long: Continue exercise to improve strength and stamina. Short: Begin to increase speed on the treadmill. Long: Continue exercise to improve strength and stamina. Short: Continue to increase speed on the treadmill. Long: Continue exercise to improve strength and stamina.    Row Name 12/12/23 1728 12/27/23 1804           Exercise Goal Re-Evaluation   Exercise Goals Review Increase Physical Activity;Increase Strength and Stamina;Able to understand and use rate of perceived exertion (RPE) scale;Able to understand and use Dyspnea scale;Knowledge and understanding of Target Heart Rate Range (THRR);Able to check pulse independently;Understanding of Exercise Prescription Increase Physical Activity;Understanding of Exercise Prescription;Increase Strength and Stamina  Comments Reviewed home exercise with pt today from 5:20 pm to 5:30 pm.  Pt plans to walk for exercise.  Reviewed THR, pulse, RPE, sign and symptoms, pulse oximetery and when to call 911 or MD.  Also discussed weather considerations and indoor options.  Pt voiced understanding. Marianny continues to do well in rehab. She increased her workload on the  treadmill to a speed of 4.5 mph and incline of 1.5%. She also was able to increase to level 5 on the T4 nustep and level 1.7 on the recumbent bike. We will continue to monitor her progress in the program.      Expected Outcomes Short: add 1-2 days a week of exercise at home on off days of pulmonary rehab. Long: maintain independent exercise routine. Short: Continue to progressively increase treadmill and nustep workloads. Long: Continue exercise to improve strength and stamina.               Discharge Exercise Prescription (Final Exercise Prescription Changes):  Exercise Prescription Changes - 12/27/23 1800       Response to Exercise   Blood Pressure (Admit) 112/62    Blood Pressure (Exercise) 140/80    Blood Pressure (Exit) 108/68    Heart Rate (Admit) 83 bpm    Heart Rate (Exercise) 104 bpm    Heart Rate (Exit) 73 bpm    Oxygen Saturation (Admit) 98 %    Oxygen Saturation (Exercise) 94 %    Oxygen Saturation (Exit) 95 %    Rating of Perceived Exertion (Exercise) 12    Perceived Dyspnea (Exercise) 1    Symptoms none    Duration Continue with 30 min of aerobic exercise without signs/symptoms of physical distress.    Intensity THRR unchanged      Progression   Progression Continue to progress workloads to maintain intensity without signs/symptoms of physical distress.    Average METs 3.39      Resistance Training   Training Prescription Yes    Weight 3 lb    Reps 10-15      Interval Training   Interval Training No      Treadmill   MPH 4.5    Grade 1.5    Minutes 15    METs 5.38      Recumbant Bike   Level 1.7    Watts 19    Minutes 15    METs 2.45      NuStep   Level 5    Minutes 15    METs 2.9      REL-XR   Level 1    Minutes 15    METs 3.9      Home Exercise Plan   Plans to continue exercise at Home (comment)   walking   Frequency Add 2 additional days to program exercise sessions.    Initial Home Exercises Provided 12/12/23      Oxygen   Maintain  Oxygen Saturation 88% or higher             Nutrition:  Target Goals: Understanding of nutrition guidelines, daily intake of sodium 1500mg , cholesterol 200mg , calories 30% from fat and 7% or less from saturated fats, daily to have 5 or more servings of fruits and vegetables.  Education: All About Nutrition: -Group instruction provided by verbal, written material, interactive activities, discussions, models, and posters to present general guidelines for heart healthy nutrition including fat, fiber, MyPlate, the role of sodium in heart healthy nutrition, utilization of the nutrition label, and utilization of this  knowledge for meal planning. Follow up email sent as well. Written material given at graduation. Flowsheet Row Pulmonary Rehab from 12/26/2023 in [facility] Cardiac and Pulmonary Rehab  Education need identified 10/22/23  Date 10/31/23  [part 2]  Educator Efren Grapes  Instruction Review Code 1- Verbalizes Understanding       Biometrics:  Pre Biometrics - 10/22/23 1537       Pre Biometrics   Height 5' 6.5" (1.689 m)    Weight 290 lb 14.4 oz (132 kg)    Waist Circumference 50.5 inches    Hip Circumference 55 inches    Waist to Hip Ratio 0.92 %    BMI (Calculated) 46.25    Single Leg Stand 3.45 seconds              Nutrition Therapy Plan and Nutrition Goals:  Nutrition Therapy & Goals - 10/22/23 1449       Nutrition Therapy   Protein (specify units) 70-90    Fiber 25 grams    Whole Grain Foods 3 servings    Saturated Fats 15 max. grams    Fruits and Vegetables 5 servings/day    Sodium 2 grams      Personal Nutrition Goals   Nutrition Goal Eat 15-30gProtein and 30-60gCarbs at each meal.    Personal Goal #2 Read labels and reduce sodium intake to below 2300mg . Ideally 1500mg  per day.    Personal Goal #3 Include more colorful produce, aim for 5-8 servings of fruits and veggies per day    Comments Patient drinking less sugary beverages. Has been trying to better control  her DM. Discussed some of her meals. Breakfast being oatmeal with cranberries and raisin with a fruit smoothie. Together walked through her breakfast highlighting  her high carb intake and lack of protein. Provided handout on pairing carbs with protein. Educated on carb sources and set goal of eating ~30-60g per meal. Reviewed mediterranean diet handout and educated on types of fats sources and how to read facts labels. She would like to meal prep more and include more veggies at meals.      Intervention Plan   Intervention Nutrition handout(s) given to patient.;Prescribe, educate and counsel regarding individualized specific dietary modifications aiming towards targeted core components such as weight, hypertension, lipid management, diabetes, heart failure and other comorbidities.    Expected Outcomes Long Term Goal: Adherence to prescribed nutrition plan.;Short Term Goal: A plan has been developed with personal nutrition goals set during dietitian appointment.;Short Term Goal: Understand basic principles of dietary content, such as calories, fat, sodium, cholesterol and nutrients.             Nutrition Assessments:  MEDIFICTS Score Key: >=70 Need to make dietary changes  40-70 Heart Healthy Diet <= 40 Therapeutic Level Cholesterol Diet  Flowsheet Row Pulmonary Rehab from 10/22/2023 in [facility] Cardiac and Pulmonary Rehab  Picture Your Plate Total Score on Admission 64      Picture Your Plate Scores: <16 Unhealthy dietary pattern with much room for improvement. 41-50 Dietary pattern unlikely to meet recommendations for good health and room for improvement. 51-60 More healthful dietary pattern, with some room for improvement.  >60 Healthy dietary pattern, although there may be some specific behaviors that could be improved.   Nutrition Goals Re-Evaluation:  Nutrition Goals Re-Evaluation     Row Name 11/22/23 1719 12/26/23 1735           Goals   Comment Patient was informed on why it  is important to maintain a  balanced diet when dealing with Respiratory issues. Explained that it takes a lot of energy to breath and when they are short of breath often they will need to have a good diet to help keep up with the calories they are expending for breathing. Patient reports that she is trying to read food labels and avoid sodium. She does not add salt to anything and makes an effort to low sodium foods.      Expected Outcome Short: Choose and plan snacks accordingly to patients caloric intake to improve breathing. Long: Maintain a diet independently that meets their caloric intake to aid in daily shortness of breath. Short: continue to get comfortable with reading and understanding food labels. Long: maintain heart healthy diet.               Nutrition Goals Discharge (Final Nutrition Goals Re-Evaluation):  Nutrition Goals Re-Evaluation - 12/26/23 1735       Goals   Comment Patient reports that she is trying to read food labels and avoid sodium. She does not add salt to anything and makes an effort to low sodium foods.    Expected Outcome Short: continue to get comfortable with reading and understanding food labels. Long: maintain heart healthy diet.             Psychosocial: Target Goals: Acknowledge presence or absence of significant depression and/or stress, maximize coping skills, provide positive support system. Participant is able to verbalize types and ability to use techniques and skills needed for reducing stress and depression.   Education: Stress, Anxiety, and Depression - Group verbal and visual presentation to define topics covered.  Reviews how body is impacted by stress, anxiety, and depression.  Also discusses healthy ways to reduce stress and to treat/manage anxiety and depression.  Written material given at graduation. Flowsheet Row Pulmonary Rehab from 12/26/2023 in [facility] Cardiac and Pulmonary Rehab  Date 12/05/23  Educator [facility]  Instruction Review Code 1-  [employer] Understanding       Education: Sleep Hygiene -Provides group verbal and written instruction about how sleep can affect your health.  Define sleep hygiene, discuss sleep cycles and impact of sleep habits. Review good sleep hygiene tips.    Initial Review & Psychosocial Screening:  Initial Psych Review & Screening - 10/11/23 1256       Initial Review   Current issues with Current Stress Concerns    Source of Stress Concerns Chronic Illness    Comments depression comes and goes does not last   seeing a therapist.      Family Dynamics   Good Support System? Yes   family, Mom     Barriers   Psychosocial barriers to participate in program There are no identifiable barriers or psychosocial needs.      Screening Interventions   Interventions To provide support and resources with identified psychosocial needs;Provide feedback about the scores to participant    Expected Outcomes Short Term goal: Utilizing psychosocial counselor, staff and physician to assist with identification of specific Stressors or current issues interfering with healing process. Setting desired goal for each stressor or current issue identified.;Long Term Goal: Stressors or current issues are controlled or eliminated.;Short Term goal: Identification and review with participant of any Quality of Life or Depression concerns found by scoring the questionnaire.;Long Term goal: The participant improves quality of Life and PHQ9 Scores as seen by post scores and/or verbalization of changes             Quality of Life  Scores:  Scores of 19 and below usually indicate a poorer quality of life in these areas.  A difference of  2-3 points is a clinically meaningful difference.  A difference of 2-3 points in the total score of the Quality of Life Index has been associated with significant improvement in overall quality of life, self-image, physical symptoms, and general health in studies assessing change in quality of  life.  PHQ-9: Review Flowsheet  More data exists      10/22/2023 03/14/2022 09/16/2021 04/22/2018 03/18/2018  Depression screen PHQ 2/9  Decreased Interest 1 0 1 0 0  Down, Depressed, Hopeless 1 0 1 0 0  PHQ - 2 Score 2 0 2 0 0  Altered sleeping 0 - 2 - -  Tired, decreased energy 0 - 1 - -  Change in appetite 0 - 1 - -  Feeling bad or failure about yourself  1 - 0 - -  Trouble concentrating 0 - 1 - -  Moving slowly or fidgety/restless 0 - 0 - -  Suicidal thoughts 0 - 0 - -  PHQ-9 Score 3 - 7 - -  Difficult doing work/chores Not difficult at all - Somewhat difficult - -   Interpretation of Total Score  Total Score Depression Severity:  1-4 = Minimal depression, 5-9 = Mild depression, 10-14 = Moderate depression, 15-19 = Moderately severe depression, 20-27 = Severe depression   Psychosocial Evaluation and Intervention:  Psychosocial Evaluation - 10/11/23 1306       Psychosocial Evaluation & Interventions   Interventions Encouraged to exercise with the program and follow exercise prescription    Comments There are no barriers to starting the program.  She has support from her family, mostly her Mom.  She does see a therapist, on no meds at this time.  Recognizes that she has has had depression at times and it does not last long.  She is ready to learn about managing her weight and improving her lifestyle.    Expected Outcomes STG attend all scheduled sessions, meet with RD  for diabetes and weight loss guidance, and follow the guidance for  exercise progression   LTG Able to maintain her exercise progression, continues to work on her weight loss goals and managing her diabetes    Continue Psychosocial Services  Follow up required by staff             Psychosocial Re-Evaluation:  Psychosocial Re-Evaluation     Row Name 11/22/23 1720 12/26/23 1732           Psychosocial Re-Evaluation   Current issues with Current Depression Current Depression      Comments Tywanda meets with  her therapist about once a week. She states that it is helping her alot. Her depression stems from anxiety. Aislee states that she has no changes in her sleep, stress, and mental health. She feels stable in this area and continues to meet with her therapist.      Expected Outcomes Short:continue meeting with therapist. Long: maintain a healthy mental state. Short:Continue to meet with therapist and monitor for any changes in mental state. Long: maintain good mental health habits.      Interventions Encouraged to attend Pulmonary Rehabilitation for the exercise Encouraged to attend Pulmonary Rehabilitation for the exercise      Continue Psychosocial Services  Follow up required by staff Follow up required by staff               Psychosocial Discharge (Final Psychosocial Re-Evaluation):  Psychosocial Re-Evaluation - 12/26/23 1732       Psychosocial Re-Evaluation   Current issues with Current Depression    Comments Aman states that she has no changes in her sleep, stress, and mental health. She feels stable in this area and continues to meet with her therapist.    Expected Outcomes Short:Continue to meet with therapist and monitor for any changes in mental state. Long: maintain good mental health habits.    Interventions Encouraged to attend Pulmonary Rehabilitation for the exercise    Continue Psychosocial Services  Follow up required by staff             Education: Education Goals: Education classes will be provided on a weekly basis, covering required topics. Participant will state understanding/return demonstration of topics presented.  Learning Barriers/Preferences:  Learning Barriers/Preferences - 10/11/23 1259       Learning Barriers/Preferences   Learning Barriers Hearing   deaf in left ear,   Learning Preferences None             General Pulmonary Education Topics:  Infection Prevention: - Provides verbal and written material to individual with discussion of  infection control including proper hand washing and proper equipment cleaning during exercise session. Flowsheet Row Pulmonary Rehab from 12/26/2023 in [facility] Cardiac and Pulmonary Rehab  Date 10/22/23  Educator NT  Instruction Review Code 1- Verbalizes Understanding       Falls Prevention: - Provides verbal and written material to individual with discussion of falls prevention and safety. Flowsheet Row Pulmonary Rehab from 12/26/2023 in [facility] Cardiac and Pulmonary Rehab  Date 10/11/23  Educator SB  Instruction Review Code 1- Verbalizes Understanding       Chronic Lung Disease Review: - Group verbal instruction with posters, models, PowerPoint presentations and videos,  to review new updates, new respiratory medications, new advancements in procedures and treatments. Providing information on websites and "800" numbers for continued self-education. Includes information about supplement oxygen, available portable oxygen systems, continuous and intermittent flow rates, oxygen safety, concentrators, and Medicare reimbursement for oxygen. Explanation of Pulmonary Drugs, including class, frequency, complications, importance of spacers, rinsing mouth after steroid MDI's, and proper cleaning methods for nebulizers. Review of basic lung anatomy and physiology related to function, structure, and complications of lung disease. Review of risk factors. Discussion about methods for diagnosing sleep apnea and types of masks and machines for OSA. Includes a review of the use of types of environmental controls: home humidity, furnaces, filters, dust mite/pet prevention, HEPA vacuums. Discussion about weather changes, air quality and the benefits of nasal washing. Instruction on Warning signs, infection symptoms, calling MD promptly, preventive modes, and value of vaccinations. Review of effective airway clearance, coughing and/or vibration techniques. Emphasizing that all should Create an Action Plan. Written material  given at graduation. Flowsheet Row Pulmonary Rehab from 12/26/2023 in [facility] Cardiac and Pulmonary Rehab  Date 11/28/23  Educator [facility]  Instruction Review Code 1- Verbalizes Understanding       AED/CPR: - Group verbal and written instruction with the use of models to demonstrate the basic use of the AED with the basic ABC's of resuscitation.    Anatomy and Cardiac Procedures: - Group verbal and visual presentation and models provide information about basic cardiac anatomy and function. Reviews the testing methods done to diagnose heart disease and the outcomes of the test results. Describes the treatment choices: Medical Management, Angioplasty, or Coronary Bypass Surgery for treating various heart conditions including Myocardial Infarction, Angina, Valve Disease, and Cardiac Arrhythmias.  Written material given at graduation. Flowsheet Row Pulmonary Rehab from 12/26/2023 in [facility] Cardiac and Pulmonary Rehab  Education need identified 10/22/23  Date 11/07/23  Educator SB  Instruction Review Code 1- Verbalizes Understanding       Medication Safety: - Group verbal and visual instruction to review commonly prescribed medications for heart and lung disease. Reviews the medication, class of the drug, and side effects. Includes the steps to properly store meds and maintain the prescription regimen.  Written material given at graduation.   Other: -Provides group and verbal instruction on various topics (see comments)   Knowledge Questionnaire Score:  Knowledge Questionnaire Score - 10/22/23 1533       Knowledge Questionnaire Score   Pre Score 23/26              Core Components/Risk Factors/Patient Goals at Admission:  Personal Goals and Risk Factors at Admission - 10/11/23 1301       Core Components/Risk Factors/Patient Goals on Admission    Weight Management Yes    Intervention Weight Management: Develop a combined nutrition and exercise program designed to reach desired  caloric intake, while maintaining appropriate intake of nutrient and fiber, sodium and fats, and appropriate energy expenditure required for the weight goal.    Admit Weight 288 lb (130.6 kg)    Goal Weight: Short Term 286 lb (129.7 kg)    Goal Weight: Long Term 239 lb (108.4 kg)    Expected Outcomes Short Term: Continue to assess and modify interventions until short term weight is achieved;Long Term: Adherence to nutrition and physical activity/exercise program aimed toward attainment of established weight goal;Weight Loss: Understanding of general recommendations for a balanced deficit meal plan, which promotes 1-2 lb weight loss per week and includes a negative energy balance of [phone] kcal/d    Improve shortness of breath with ADL's Yes    Intervention Provide education, individualized exercise plan and daily activity instruction to help decrease symptoms of SOB with activities of daily living.    Expected Outcomes Short Term: Improve cardiorespiratory fitness to achieve a reduction of symptoms when performing ADLs;Long Term: Be able to perform more ADLs without symptoms or delay the onset of symptoms    Increase knowledge of respiratory medications and ability to use respiratory devices properly  Yes    Intervention Provide education and demonstration as needed of appropriate use of medications, inhalers, and oxygen therapy.    Expected Outcomes Short Term: Achieves understanding of medications use. Understands that oxygen is a medication prescribed by physician. Demonstrates appropriate use of inhaler and oxygen therapy.;Long Term: Maintain appropriate use of medications, inhalers, and oxygen therapy.    Diabetes Yes    Intervention Provide education about signs/symptoms and action to take for hypo/hyperglycemia.;Provide education about proper nutrition, including hydration, and aerobic/resistive exercise prescription along with prescribed medications to achieve blood glucose in normal ranges:  Fasting glucose 65-99 mg/dL    Expected Outcomes Short Term: Participant verbalizes understanding of the signs/symptoms and immediate care of hyper/hypoglycemia, proper foot care and importance of medication, aerobic/resistive exercise and nutrition plan for blood glucose control.;Long Term: Attainment of HbA1C < 7%.    Heart Failure Yes    Intervention Provide a combined exercise and nutrition program that is supplemented with education, support and counseling about heart failure. Directed toward relieving symptoms such as shortness of breath, decreased exercise tolerance, and extremity edema.    Expected Outcomes Improve functional capacity of life;Short term: Attendance in program 2-3 days a week with increased exercise capacity.  Reported lower sodium intake. Reported increased fruit and vegetable intake. Reports medication compliance.;Short term: Daily weights obtained and reported for increase. Utilizing diuretic protocols set by physician.;Long term: Adoption of self-care skills and reduction of barriers for early signs and symptoms recognition and intervention leading to self-care maintenance.    Hypertension Yes    Intervention Provide education on lifestyle modifcations including regular physical activity/exercise, weight management, moderate sodium restriction and increased consumption of fresh fruit, vegetables, and low fat dairy, alcohol moderation, and smoking cessation.;Monitor prescription use compliance.    Expected Outcomes Short Term: Continued assessment and intervention until BP is < 140/50mm HG in hypertensive participants. < 130/72mm HG in hypertensive participants with diabetes, heart failure or chronic kidney disease.;Long Term: Maintenance of blood pressure at goal levels.    Lipids Yes    Intervention Provide education and support for participant on nutrition & aerobic/resistive exercise along with prescribed medications to achieve LDL 70mg , HDL >40mg .    Expected Outcomes Short  Term: Participant states understanding of desired cholesterol values and is compliant with medications prescribed. Participant is following exercise prescription and nutrition guidelines.;Long Term: Cholesterol controlled with medications as prescribed, with individualized exercise RX and with personalized nutrition plan. Value goals: LDL < 70mg , HDL > 40 mg.             Education:Diabetes - Individual verbal and written instruction to review signs/symptoms of diabetes, desired ranges of glucose level fasting, after meals and with exercise. Acknowledge that pre and post exercise glucose checks will be done for 3 sessions at entry of program. Flowsheet Row Pulmonary Rehab from 12/26/2023 in [facility] Cardiac and Pulmonary Rehab  Date 10/22/23  Educator NT  Instruction Review Code 1- Verbalizes Understanding       Know Your Numbers and Heart Failure: - Group verbal and visual instruction to discuss disease risk factors for cardiac and pulmonary disease and treatment options.  Reviews associated critical values for Overweight/Obesity, Hypertension, Cholesterol, and Diabetes.  Discusses basics of heart failure: signs/symptoms and treatments.  Introduces Heart Failure Zone chart for action plan for heart failure.  Written material given at graduation. Flowsheet Row Pulmonary Rehab from 12/26/2023 in [facility] Cardiac and Pulmonary Rehab  Date 11/21/23  Educator SB  Instruction Review Code 1- Verbalizes Understanding       Core Components/Risk Factors/Patient Goals Review:   Goals and Risk Factor Review     Row Name 11/22/23 1718 12/26/23 1737           Core Components/Risk Factors/Patient Goals Review   Personal Goals Review Improve shortness of breath with ADL's Hypertension;Diabetes      Review Spoke to patient about their shortness of breath and what they can do to improve. Patient has been informed of breathing techniques when starting the program. Patient is informed to tell staff if they  have had any med changes and that certain meds they are taking or not taking can be causing shortness of breath. Patient reports that she owns a BP cuff and glucometer but that she does not regulary check her blood sugar and BP at home. She was encouraged to do so. She does follow up with her doctor regularly to monitor risk factors.      Expected Outcomes Short: Attend [facility] regularly to improve shortness of breath with ADL's. Long: maintain independence with ADL's Short: start checking BP and BG at home consistently. Long: control risk factors. with healthy lifestyle and support from medical team.  Core Components/Risk Factors/Patient Goals at Discharge (Final Review):   Goals and Risk Factor Review - 12/26/23 1737       Core Components/Risk Factors/Patient Goals Review   Personal Goals Review Hypertension;Diabetes    Review Patient reports that she owns a BP cuff and glucometer but that she does not regulary check her blood sugar and BP at home. She was encouraged to do so. She does follow up with her doctor regularly to monitor risk factors.    Expected Outcomes Short: start checking BP and BG at home consistently. Long: control risk factors. with healthy lifestyle and support from medical team.             ITP Comments:  ITP Comments     Row Name 10/11/23 1311 10/22/23 1532 10/24/23 1714 11/07/23 1212 12/05/23 1349   ITP Comments Virtual orientation call completed today. shehas an appointment on Date: 10/22/2023  for EP eval and gym Orientation.  Documentation of diagnosis can be found in [facility] 08/28/2023 . Completed and gym orientation. Initial ITP created and sent for review to Dr. Faud Aleskerov, Medical Director. First full day of exercise!  Patient was oriented to gym and equipment including functions, settings, policies, and procedures.  Patient's individual exercise prescription and treatment plan were reviewed.  All starting workloads were established based  on the results of the 6 minute walk test done at initial orientation visit.  The plan for exercise progression was also introduced and progression will be customized based on patient's performance and goals. 30 Day review completed. Medical Director ITP review done, changes made as directed, and signed approval by Medical Director.    new to program 30 Day review completed. Medical Director ITP review done, changes made as directed, and signed approval by Medical Director.    Row Name 01/02/24 1027           ITP Comments 30 Day review completed. Medical Director ITP review done, changes made as directed, and signed approval by Medical Director.                Comments:

## 2024-01-03 ENCOUNTER — Ambulatory Visit
Admission: RE | Admit: 2024-01-03 | Discharge: 2024-01-03 | Disposition: A | Discharge status: Home / Self Care | Source: Ambulatory Visit | Attending: Emergency Medicine

## 2024-01-03 ENCOUNTER — Ambulatory Visit
Admission: RE | Admit: 2024-01-03 | Discharge: 2024-01-03 | Disposition: A | Discharge status: Home / Self Care | Source: Ambulatory Visit | Attending: Emergency Medicine | Admitting: Emergency Medicine

## 2024-01-03 ENCOUNTER — Encounter

## 2024-01-03 ENCOUNTER — Telehealth: Payer: Self-pay

## 2024-01-03 VITALS — BP 114/80 | HR 70 | Temp 99.0°F | Resp 18

## 2024-01-03 DIAGNOSIS — R051 Acute cough: Secondary | ICD-10-CM | POA: Diagnosis not present

## 2024-01-03 DIAGNOSIS — R0602 Shortness of breath: Secondary | ICD-10-CM | POA: Diagnosis not present

## 2024-01-03 DIAGNOSIS — R059 Cough, unspecified: Secondary | ICD-10-CM | POA: Diagnosis present

## 2024-01-03 DIAGNOSIS — I110 Hypertensive heart disease with heart failure: Secondary | ICD-10-CM | POA: Diagnosis not present

## 2024-01-03 DIAGNOSIS — J189 Pneumonia, unspecified organism: Secondary | ICD-10-CM

## 2024-01-03 DIAGNOSIS — R918 Other nonspecific abnormal finding of lung field: Secondary | ICD-10-CM | POA: Insufficient documentation

## 2024-01-03 DIAGNOSIS — I509 Heart failure, unspecified: Secondary | ICD-10-CM | POA: Diagnosis not present

## 2024-01-03 MED ORDER — LEVOFLOXACIN 750 MG PO TABS
750.0000 mg | ORAL_TABLET | Freq: Every day | ORAL | 0 refills | Status: AC
Start: 2024-01-03 — End: 2024-01-08

## 2024-01-03 MED ORDER — AZITHROMYCIN 250 MG PO TABS: 250.0000 mg | ORAL_TABLET | Freq: Every day | ORAL | 0 refills | Status: DC

## 2024-01-03 MED ORDER — PREDNISONE 10 MG PO TABS
40.0000 mg | ORAL_TABLET | Freq: Every day | ORAL | 0 refills | Status: DC
Start: 2024-01-03 — End: 2024-01-03

## 2024-01-03 NOTE — ED Provider Notes (Signed)
 [name]    CSN: [id number] Arrival date & time: 01/03/24  1029      History   Chief Complaint Chief Complaint  Patient presents with   Cough    Throwing up nasal congestion dizziness when I cough. I did have Covid about 2 weeks ago. - Entered by patient    HPI Heather Norman is a 48 y.o. female.  Patient presents with 2 to 3-week history of cough.  She reports shortness of breath with coughing episodes.  At times, she coughs until she vomits.  She has been using her albuterol  inhaler and Flonase  nasal spray without relief.  She denies fever, chest pain, shortness of breath when not coughing.  Her cough is nonproductive.  Her medical history includes congestive heart failure, diabetes, kidney disease, hypertension, obstructive sleep apnea.  The history is provided by the patient and medical records.    Past Medical History:  Diagnosis Date   (HFpEF) heart failure with preserved ejection fraction (HCC)    a. 04/2017 Echo: EF 55-60%, no rwma, Mild MR, mildly dil LA. Nl RV fxn; b. 06/2019 Echo: EF 60-65%, mod LVH. Gr2 DD. Mildly dil LA; c. 03/2023 Cath: Nl cors. mild PAH (PA 31/17 (26) w/ high output (CO/CI 8.4/3.5); d. 08/2023 Echo: EF 55-60%, no rwma, GrI DD, mildly reduced RV fxn, RVSP 21.6 mmHg. No significant valvular dzs.   Arthritis    kness/hands - no meds   Diabetes mellitus without complication (HCC)    DM (diabetes mellitus) (HCC) 05/11/2017   Dyspnea on exertion    Headache(784.0)    otc med prn   Heart murmur    History of cardiac cath    a. 03/2023 Cath: Nl cors.   Hyperlipidemia    no meds since pregnancy   Hypertension    Morbid obesity (HCC)    NSVT (nonsustained ventricular tachycardia) (HCC)    a. 07/2022 Zio: 44 runs NSVT, longest 8 beats, fastest 182. 3 brief SVT runs.  21.3% PVC burden.   PVC (premature ventricular contraction)    a. 07/2022 [facility]io: 21.3 PVCs, 9.6% couplets, 3.5% triplets, 44 brief runs of NSVT.    Patient Active Problem List    Diagnosis Date Noted   PCOS (polycystic ovarian syndrome) 11/07/2023   Intramural leiomyoma of uterus 11/07/2023   Menorrhagia with irregular cycle 11/07/2023   AKI (acute kidney injury) (HCC) 08/22/2023   Benign hypertensive kidney disease with chronic kidney disease 04/12/2023   Chronic kidney disease, stage 3b (HCC) 04/12/2023   Hyperlipidemia 04/12/2023   Type 2 diabetes mellitus with diabetic chronic kidney disease (HCC) 04/12/2023   OSA (obstructive sleep apnea) 10/25/2022   Numbness and tingling 06/20/2021   Difficulty sleeping 06/20/2021   Blurred vision 06/20/2021   Type 2 diabetes mellitus without complication, without long-term current use of insulin  (HCC) 05/19/2018   Headache 09/12/2017   Psychological and behavioral factors associated with disorders or diseases classified elsewhere 05/28/2017   DM (diabetes mellitus) (HCC) 05/11/2017   Snoring 05/11/2017   Shortness of breath 04/25/2017   Morbid obesity (HCC) 08/30/2015   Chronic diastolic CHF (congestive heart failure) (HCC) 08/30/2015   Loose stools 06/07/2015   Adjustment disorder with depressed mood 03/11/2015   Encounter for screening for other disorder 03/11/2015   Essential hypertension 02/16/2015   Abnormal levels of other serum enzymes 12/17/2014   Encounter for surveillance of injectable contraceptive 07/28/2014   Other allergic rhinitis 07/28/2014   Hypokalemia 03/18/2014   Urinary tract infection 03/18/2014  Abnormal finding in urine 03/11/2014   Urinary incontinence 03/11/2014   Left ventricular hypertrophy 02/16/2014   Congestive heart failure (HCC) 11/11/2013   Elderly multigravida with antepartum condition or complication 05/22/2011    Past Surgical History:  Procedure Laterality Date   BREAST BIOPSY Right 11/12/2023   us  bx, 10:00 8 cmfn, ribbon marker, path pending   BREAST BIOPSY Right 11/12/2023   US  RT BREAST BX W LOC DEV 1ST LESION IMG BX SPEC US  GUIDE 11/12/2023 [facility]    CERVICAL CERCLAGE  06/01/2011   Procedure: CERCLAGE CERVICAL;  Surgeon: Atlas Blank, DO;  Location: [facility];  Service: Gynecology;  Laterality: N/A;  REQUESTING PORTABLE ULTARSOUND AT BEDSIDE PT'S EDC:12/02/2011   CERVICAL CERCLAGE  06/01/2011   Procedure: CERCLAGE CERVICAL;  Surgeon: Atlas Blank, DO;  Location: [facility];  Service: Gynecology;  Laterality: N/A;  REQUESTING PORTABLE ULTARSOUND AT BEDSIDE PT'S EDC:12/02/2011   endosocpy     RIGHT/LEFT HEART CATH AND CORONARY ANGIOGRAPHY N/A 03/28/2023   Procedure: RIGHT/LEFT HEART CATH AND CORONARY ANGIOGRAPHY;  Surgeon: Mardell Shade, MD;  Location: [facility] INVASIVE CV LAB;  Service: Cardiovascular;  Laterality: N/A;   svd     x 2   tab     x 1   TUBAL LIGATION      OB History     Gravida  5   Para  3   Term  0   Preterm  3   AB  2   Living  3      SAB  2   IAB  0   Ectopic  0   Multiple  0   Live Births               Home Medications    Prior to Admission medications   Medication Sig Start Date End Date Taking? Authorizing Provider  levofloxacin (LEVAQUIN) 750 MG tablet Take 1 tablet (750 mg total) by mouth daily for 5 days. 01/03/24 01/08/24 Yes Wellington Half, NP  albuterol  (VENTOLIN  HFA) 108 (90 Base) MCG/ACT inhaler Inhale 1-2 puffs into the lungs every 6 (six) hours as needed. 07/20/22   Wellington Half, NP  aluminum-magnesium hydroxide 200-200 MG/5ML suspension Take 15 mLs by mouth every 6 (six) hours as needed for indigestion.    [provider]  aspirin  EC 81 MG tablet Take 81 mg by mouth daily.    [provider]  atorvastatin  (LIPITOR) 20 MG tablet TAKE 1 TABLET(20 MG) BY MOUTH DAILY 12/26/22   Shawnee Dellen A, FNP  carvedilol  (COREG ) 25 MG tablet Take 0.5 tablets (12.5 mg total) by mouth 2 (two) times daily with a meal. Patient taking differently: Take 25 mg by mouth 2 (two) times daily with a meal. 10/08/23   Bensimhon, Rheta Celestine, MD  cetirizine (ZYRTEC) 10 MG tablet Take 10 mg  by mouth daily. 08/21/23   [provider]  Elagolix Sodium  (ORILISSA ) 150 MG TABS Take 1 tablet (150 mg total) by mouth daily. 11/26/23 02/24/24  Sofia Dunn, MD  empagliflozin  (JARDIANCE ) 25 MG TABS tablet Take 1 tablet (25 mg total) by mouth daily before breakfast. 01/25/23   Charlette Console, FNP  famotidine  (PEPCID ) 20 MG tablet Take 1 tablet (20 mg total) by mouth 2 (two) times daily. 02/20/23   Brigitte Canard, PA-C  famotidine -calcium  carbonate-magnesium hydroxide (PEPCID  COMPLETE) 10-800-165 MG chewable tablet Chew 1 tablet by mouth daily as needed.    [provider]  fluticasone  (FLONASE ) 50 MCG/ACT nasal spray Place 2 sprays  into both nostrils daily. 08/21/23   [provider]  latanoprost  (XALATAN ) 0.005 % ophthalmic solution Place 1 drop into both eyes at bedtime. 01/17/22   [provider]  meclizine  (ANTIVERT ) 25 MG tablet Take 1 tablet (25 mg total) by mouth 3 (three) times daily as needed for dizziness or nausea. 08/23/22   Jacquie Maudlin, MD  metolazone  (ZAROXOLYN ) 2.5 MG tablet Take 1 tablet (2.5 mg total) by mouth once a week. 08/24/23   Tiajuana Fluke, MD  mexiletine (MEXITIL ) 150 MG capsule Take 2 capsules (300 mg total) by mouth 2 (two) times daily. 03/26/23   Bensimhon, Rheta Celestine, MD  pantoprazole  (PROTONIX ) 40 MG tablet Take 1 tablet (40 mg total) by mouth daily. 02/20/23 02/15/24  Brigitte Canard, PA-C  potassium chloride  20 MEQ/15ML (10%) SOLN Take 60 mLs (80 mEq total) by mouth daily. And additional 60meq ( ) on the days you take the metolazone  10/12/23 10/11/24  Bensimhon, Rheta Celestine, MD  sacubitril -valsartan  (ENTRESTO ) 49-51 MG Take 1 tablet by mouth 2 (two) times daily. 12/28/23   Bensimhon, Rheta Celestine, MD  Semaglutide ,0.25 or 0.5MG /DOS, (OZEMPIC , 0.25 OR 0.5 MG/DOSE,) 2 MG/3ML SOPN Inject 0.5 mg into the skin once a week. 01/02/24   Charlette Console, FNP  spironolactone  (ALDACTONE ) 25 MG tablet TAKE 1 TABLET(25 MG) BY MOUTH DAILY Patient taking  differently: Take 50 mg by mouth once. 04/13/22   Gollan, Timothy J, MD  torsemide  (DEMADEX ) 20 MG tablet Take 4 tablets (80 mg total) by mouth daily. Patient taking differently: Take 40 mg by mouth 2 (two) times daily. Take 60MG  in the morning and 40 MG at night 03/05/23   Mil[place], Arlice Bene, FNP    Family History Family History  Problem Relation Age of Onset   Diabetes Mother    Hypertension Mother    Hyperlipidemia Mother    Hypertension Father    Breast cancer Maternal Grandmother 17    Social History Social History   Tobacco Use   Smoking status: Never   Smokeless tobacco: Never  Vaping Use   Vaping status: Never Used  Substance Use Topics   Alcohol use: No   Drug use: No     Allergies   Penicillins   Review of Systems Review of Systems  Constitutional:  Negative for chills and fever.  HENT:  Negative for ear pain and sore throat.   Respiratory:  Positive for cough and shortness of breath.   Cardiovascular:  Negative for chest pain and palpitations.     Physical Exam Triage Vital Signs ED Triage Vitals  Encounter Vitals Group     BP 01/03/24 1104 114/80     Systolic BP Percentile --      Diastolic BP Percentile --      Pulse Rate 01/03/24 1104 70     Resp 01/03/24 1104 18     Temp 01/03/24 1104 99 F (37.2 C)     Temp Source 01/03/24 1104 Oral     SpO2 01/03/24 1104 96 %     Weight --      Height --      Head Circumference --      Peak Flow --      Pain Score 01/03/24 1059 0     Pain Loc --      Pain Education --      Exclude from Growth Chart --    No data found.  Updated Vital Signs BP 114/80 (BP Location: Right Arm)   Pulse 70  Temp 99 F (37.2 C) (Oral)   Resp 18   SpO2 96%   Visual Acuity Right Eye Distance:   Left Eye Distance:   Bilateral Distance:    Right Eye Near:   Left Eye Near:    Bilateral Near:     Physical Exam Constitutional:      General: She is not in acute distress. HENT:     Right Ear: Tympanic membrane  normal.     Left Ear: Tympanic membrane normal.     Nose: Nose normal.     Mouth/Throat:     Mouth: Mucous membranes are moist.     Pharynx: Oropharynx is clear.  Cardiovascular:     Rate and Rhythm: Normal rate and regular rhythm.     Heart sounds: Normal heart sounds.  Pulmonary:     Effort: Pulmonary effort is normal. No respiratory distress.     Breath sounds: Normal breath sounds.  Neurological:     Mental Status: She is alert.      UC Treatments / Results  Labs (all labs ordered are listed, but only abnormal results are displayed) Labs Reviewed - No data to display  EKG   Radiology DG Chest 2 View Result Date: 01/03/2024 CLINICAL DATA:  Productive cough and 1-1/2 week history of shortness of breath EXAM: CHEST - 2 VIEW COMPARISON:  Chest radiograph dated 08/22/2023 FINDINGS: Normal lung volumes. Mild bibasilar patchy opacities. No focal consolidations. No pleural effusion or pneumothorax. The heart size and mediastinal contours are within normal limits. No acute osseous abnormality. IMPRESSION: Mild bibasilar patchy opacities, which may represent atelectasis or infection. Electronically Signed   By: Limin  Xu M.D.   On: 01/03/2024 12:04    Procedures Procedures (including critical care time)  Medications Ordered in UC Medications - No data to display  Initial Impression / Assessment and Plan / UC Course  I have reviewed the triage vital signs and the nursing notes.  Pertinent labs & imaging results that were available during my care of the patient were reviewed by me and considered in my medical decision making (see chart for details).    Cough, shortness of breath.  Afebrile and vital signs are stable.  O2 sat 96% on room air.  CXR shows  "Mild bibasilar patchy opacities, which may represent atelectasis or infection."  Patient is allergic to penicillin which in the past has caused her tongue to swell.  She does not know if she has ever taken cephalosporins.  Given her  reaction to penicillin, avoiding cephalosporins today.  Based on current pneumonia treatment guidelines, treating today with Levaquin x 5 days.  Instructed patient to continue her albuterol  inhaler as directed; her inhaler is in-date and has plenty of activations.  Instructed her to follow-up with her PCP tomorrow.  ED precautions given.  Education provided on pneumonia and shortness of breath.  She agrees to plan of care.  Final Clinical Impressions(s) / UC Diagnoses   Final diagnoses:  Acute cough  Shortness of breath  Pneumonia of both lower lobes due to infectious organism     Discharge Instructions      Go to [facility] for your x-ray.    [address] [facility] [address]  [address] [place], [place] [address] [phone]   Take the Levaquin as directed.  Use your albuterol  inhaler as directed.  Follow up with your primary care provider tomorrow.  Go to the emergency department if you have worsening symptoms.      ED  Prescriptions     Medication Sig Dispense Auth. Provider   azithromycin  (ZITHROMAX ) 250 MG tablet  (Status: Discontinued) Take 1 tablet (250 mg total) by mouth daily. Take first 2 tablets together, then 1 every day until finished. 6 tablet Wellington Half, NP   predniSONE  (DELTASONE ) 10 MG tablet  (Status: Discontinued) Take 4 tablets (40 mg total) by mouth daily for 5 days. 20 tablet Wellington Half, NP   levofloxacin (LEVAQUIN) 750 MG tablet Take 1 tablet (750 mg total) by mouth daily for 5 days. 5 tablet Wellington Half, NP      PDMP not reviewed this encounter.   Wellington Half, NP 01/03/24 1229

## 2024-01-03 NOTE — Telephone Encounter (Signed)
 Copied from CRM [id number]. Topic: Clinical - Medical Advice >> [name] 03, 2024  2:09 PM Heather Norman wrote: Reason for CRM: Patient states PCP told her to tell provider - she did have COVID first and now has pneumonia, she is using her machine but having Norman hard time breathing while using it.

## 2024-01-03 NOTE — Discharge Instructions (Addendum)
 Go to [facility] for your x-ray.    [address] [facility] [address]  [address] [place], [place] [address] [phone]   Take the Levaquin as directed.  Use your albuterol  inhaler as directed.  Follow up with your primary care provider tomorrow.  Go to the emergency department if you have worsening symptoms.

## 2024-01-03 NOTE — Telephone Encounter (Signed)
 Documentation

## 2024-01-03 NOTE — ED Triage Notes (Signed)
 Patient was dx with Covid on May 19 th. Patient has a cough that started a week after diagnosis. Patient reports that she coughs until she throws up. Also complains of runny nose. Only taken Flonase  and inhaler with no relief. Patient currently denies pain.

## 2024-01-04 ENCOUNTER — Telehealth: Payer: Self-pay | Admitting: Family

## 2024-01-04 NOTE — Progress Notes (Unsigned)
 ADVANCED HF CLINIC NOTE   Primary Care: Evette Hoes, NP Primary Cardiologist: Belva Boyden, MD  Chief Complaint: fatigue   HPI:  Heather Norman is a 48 y/o female with a history of arthritis, DM, hyperlipidemia, HTN, morbid obesity and chronic diastolic heart failure.   Zio 10/21 12.5% PVCs Saw EP Marven Slimmer) and suggested possible AAD (flecainide or propafenone) if developed symptoms or LV dysfunction.   Echo 02/10/21 EF 50-55% G1DD   Echo 01/26/22 EF 55-60% mild LVH and trivial MR.  Sleep study 07/24/22: AHI 35.4 Sats down to 75%.  Possible AF during study lasting 4.5 hours. Zio placed    Zio patch 1/24  - Sinus rhythm. No AF - 44 runs NSVT (longest 8 beats)  - 21.3% PVCs (3 morphologies 9.7%, 7.3%, 4.3%)  Saw Dr. Marven Slimmer in 3/24. felt not to be ablation candidate due to obesity. Recommended continue carvedilol  as EF stable  Started on CPAP in 2024. Wore CPAP for awhile but then got bronchitis and stopped.   Seen in ER 10/30/22 for CP. Hstrop, ECG and d-Dimer all normal. BNP elevated 47 -> 480 so torsemide  increased.  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   Seen in ED on 08/15/23 for volume overload. Got IV lasix . Increased metolazone  for 1 to 2 x/week. Unfortunately developed low BP and AKI (Scr 1.5 -> 2.5) and admitted 08/22/23. Given IVF.   Echo 1/25 EF 60-65% G1DD  RV mildly down.   Seen in Urgent Care 06/25 and diagnosed with pneumonia.   She presents today for a HF follow-up visit with a chief complaint of minimal fatigue. Has associated productive cough, chest pain/ dizziness when coughing. Has not been wearing CPAP since pneumonia diagnosis. Denies shortness of breath, palpitations, abdominal distention, pedal edema.   Was doing pulmonary rehab 3 days/ week but has had to pause it because of her cough with pneumonia. She says that she finishes her antibiotics tomorrow.   Has uterine fibroids  and says that she may need surgery in the future.   Past Medical History:  Diagnosis Date   (HFpEF) heart failure with preserved ejection fraction (HCC)    a. 04/2017 Echo: EF 55-60%, no rwma, Mild MR, mildly dil LA. Nl RV fxn; b. 06/2019 Echo: EF 60-65%, mod LVH. Gr2 DD. Mildly dil LA; c. 03/2023 Cath: Nl cors. mild PAH (PA 31/17 (26) w/ high output (CO/CI 8.4/3.5); d. 08/2023 Echo: EF 55-60%, no rwma, GrI DD, mildly reduced RV fxn, RVSP 21.6 mmHg. No significant valvular dzs.   Arthritis    kness/hands - no meds   Diabetes mellitus without complication (HCC)    DM (diabetes mellitus) (HCC) 05/11/2017   Dyspnea on exertion    Headache(784.0)    otc med prn   Heart murmur    History of cardiac cath    a. 03/2023 Cath: Nl cors.   Hyperlipidemia    no meds since pregnancy   Hypertension    Morbid obesity (HCC)    NSVT (nonsustained ventricular tachycardia) (HCC)    a. 07/2022 Zio: 44 runs NSVT, longest 8 beats, fastest 182. 3 brief SVT runs.  21.3% PVC burden.   PVC (premature ventricular contraction)    a. 07/2022 Zio: 21.3 PVCs, 9.6% couplets, 3.5% triplets, 44 brief runs of NSVT.    Current Outpatient Medications  Medication Sig Dispense Refill   albuterol  (VENTOLIN  HFA) 108 (90 Base) MCG/ACT inhaler Inhale 1-2  puffs into the lungs every 6 (six) hours as needed. 18 g 0   aluminum-magnesium hydroxide 200-200 MG/5ML suspension Take 15 mLs by mouth every 6 (six) hours as needed for indigestion.     aspirin  EC 81 MG tablet Take 81 mg by mouth daily.     atorvastatin  (LIPITOR) 20 MG tablet TAKE 1 TABLET(20 MG) BY MOUTH DAILY 90 tablet 3   carvedilol  (COREG ) 25 MG tablet Take 0.5 tablets (12.5 mg total) by mouth 2 (two) times daily with a meal. (Patient taking differently: Take 25 mg by mouth 2 (two) times daily with a meal.) 225 tablet 3   cetirizine (ZYRTEC) 10 MG tablet Take 10 mg by mouth daily.     Elagolix Sodium  (ORILISSA ) 150 MG TABS Take 1 tablet (150 mg total) by mouth daily. 30  tablet 2   empagliflozin  (JARDIANCE ) 25 MG TABS tablet Take 1 tablet (25 mg total) by mouth daily before breakfast. 30 tablet 6   famotidine  (PEPCID ) 20 MG tablet Take 1 tablet (20 mg total) by mouth 2 (two) times daily.     famotidine -calcium  carbonate-magnesium hydroxide (PEPCID  COMPLETE) 10-800-165 MG chewable tablet Chew 1 tablet by mouth daily as needed.     fluticasone  (FLONASE ) 50 MCG/ACT nasal spray Place 2 sprays into both nostrils daily.     latanoprost  (XALATAN ) 0.005 % ophthalmic solution Place 1 drop into both eyes at bedtime.     levofloxacin  (LEVAQUIN ) 750 MG tablet Take 1 tablet (750 mg total) by mouth daily for 5 days. 5 tablet 0   meclizine  (ANTIVERT ) 25 MG tablet Take 1 tablet (25 mg total) by mouth 3 (three) times daily as needed for dizziness or nausea. 30 tablet 1   metolazone  (ZAROXOLYN ) 2.5 MG tablet Take 1 tablet (2.5 mg total) by mouth once a week.     mexiletine (MEXITIL ) 150 MG capsule Take 2 capsules (300 mg total) by mouth 2 (two) times daily. 360 capsule 3   pantoprazole  (PROTONIX ) 40 MG tablet Take 1 tablet (40 mg total) by mouth daily. 90 tablet 3   potassium chloride  20 MEQ/15ML (10%) SOLN Take 60 mLs (80 mEq total) by mouth daily. And additional 60meq ( ) on the days you take the metolazone  473 mL 6   sacubitril -valsartan  (ENTRESTO ) 49-51 MG Take 1 tablet by mouth 2 (two) times daily. 180 tablet 3   Semaglutide ,0.25 or 0.5MG /DOS, (OZEMPIC , 0.25 OR 0.5 MG/DOSE,) 2 MG/3ML SOPN Inject 0.5 mg into the skin once a week. 3 mL 1   spironolactone  (ALDACTONE ) 25 MG tablet TAKE 1 TABLET(25 MG) BY MOUTH DAILY (Patient taking differently: Take 50 mg by mouth once.) 90 tablet 3   torsemide  (DEMADEX ) 20 MG tablet Take 4 tablets (80 mg total) by mouth daily. (Patient taking differently: Take 40 mg by mouth 2 (two) times daily. Take 60MG  in the morning and 40 MG at night) 180 tablet 6   No current facility-administered medications for this visit.    Allergies  Allergen  Reactions   Penicillins Anaphylaxis    Has patient had a PCN reaction causing immediate rash, facial/tongue/throat swelling, SOB or lightheadedness with hypotension: Yes Has patient had a PCN reaction causing severe rash involving mucus membranes or skin necrosis: No Has patient had a PCN reaction that required hospitalization: No Has patient had a PCN reaction occurring within the last 10 years: No If all of the above answers are "NO", then may proceed with Cephalosporin use.      Social History   Socioeconomic History  Marital status: Single    Spouse name: Not on file   Number of children: 3   Years of education: 69   Highest education level: 12th grade  Occupational History   Not on file  Tobacco Use   Smoking status: Never   Smokeless tobacco: Never  Vaping Use   Vaping status: Never Used  Substance and Sexual Activity   Alcohol use: No   Drug use: No   Sexual activity: Yes    Birth control/protection: Surgical  Other Topics Concern   Not on file  Social History Narrative   Not on file   Social Drivers of Health   Financial Resource Strain: Medium Risk (08/13/2017)   Overall Financial Resource Strain (CARDIA)    Difficulty of Paying Living Expenses: Somewhat hard  Food Insecurity: Food Insecurity Present (08/22/2023)   Hunger Vital Sign    Worried About Running Out of Food in the Last Year: Often true    Ran Out of Food in the Last Year: Sometimes true  Transportation Needs: No Transportation Needs (08/22/2023)   PRAPARE - Administrator, Civil Service (Medical): No    Lack of Transportation (Non-Medical): No  Physical Activity: Sufficiently Active (06/09/2019)   Exercise Vital Sign    Days of Exercise per Week: 5 days    Minutes of Exercise per Session: 150+ min  Stress: Stress Concern Present (08/13/2017)   [employer] of Occupational Health - Occupational Stress Questionnaire    Feeling of Stress : Rather much  Social Connections: Moderately  Isolated (08/13/2017)   Social Connection and Isolation Panel [NHANES]    Frequency of Communication with Friends and Family: More than three times a week    Frequency of Social Gatherings with Friends and Family: Never    Attends Religious Services: Never    Database administrator or Organizations: No    Attends Banker Meetings: Never    Marital Status: Never married  Intimate Partner Violence: Not At Risk (08/22/2023)   Humiliation, Afraid, Rape, and Kick questionnaire    Fear of Current or Ex-Partner: No    Emotionally Abused: No    Physically Abused: No    Sexually Abused: No      Family History  Problem Relation Age of Onset   Diabetes Mother    Hypertension Mother    Hyperlipidemia Mother    Hypertension Father    Breast cancer Maternal Grandmother 77   Vitals:   01/07/24 1452  BP: 134/68  Pulse: 61  SpO2: 98%  Weight: 290 lb (131.5 kg)   Wt Readings from Last 3 Encounters:  01/07/24 290 lb (131.5 kg)  12/07/23 294 lb 6.4 oz (133.5 kg)  11/21/23 296 lb (134.3 kg)   Lab Results  Component Value Date   CREATININE 1.83 (H) 10/18/2023   CREATININE 1.93 (H) 10/08/2023   CREATININE 1.18 (H) 08/27/2023    PHYSICAL EXAM:  General: Well appearing. No resp difficulty HEENT: normal Neck: supple, no JVD Cor: Regular rhythm, rate. No rubs, gallops or murmurs Lungs: clear Abdomen: soft, nontender, nondistended. Extremities: no cyanosis, clubbing, rash, edema Neuro: alert & oriented X 3. Moves all 4 extremities w/o difficulty. Affect pleasant   ASSESSMENT & PLAN:   1. 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 HTN - BP 117/74 - BMET 12/17/23 reviewed: sodium 136, potassium 3.3, creatinine 2.33 & GFR 25 - wanted to check BMET today since potassium was low but she says that nephrology is rechecking it in 2 weeks - saw nephrology (Kolluru) 05/25   3. PVC's, frequent - Zio 10/21 12.5% PVCs Saw EP Marven Slimmer) and suggested possible AAD (flecainide or propafenone) if developed symptoms or LV dysfunction.  - Zio patch 1/24. Sinus No AF. 44 runs NSVT (longest 8 beats). 21.3% PVCs (3 morphologies 9.7%, 7.3%, 4.3%) - suspect driven by OSA but could aslo be infiltrative process (? Sarcoid) -> cMRI has been denied even after appeal - Saw Dr. Marven Slimmer 3/24 felt not to be ablation candidate due to obesity. Recommended continue carvedilol  as EF stable - PVCs not improve with CPAP - Now on mexilitene 300 bid - Still awaiting cMRI (insurance has denied).    4. Possible AF  - noted on sleep study and AppleWatch - No AF on Zio -> suspect this is likely PVCs - saw EP (Riddle) 05/25  5. OSA, severe - Sleep study 07/24/22: AHI 35.4 Sats down to 75%.  - jon CPAP  6. Morbid obesity - Continue GLP1 - was participating in pulmonary rehab but had to pause due to pneumonia   Return in 3 months, sooner if needed.   Charlette Console, FNP  1:41 PM

## 2024-01-04 NOTE — Telephone Encounter (Signed)
 Called to confirm/remind patient of their appointment at the [facility] on 01/07/24.   Appointment:   [x] Confirmed  [] Left mess   [] No answer/No voice mail  [] VM Full/unable to leave message  [] Phone not in service  Patient reminded to bring all medications and/or complete list.  Confirmed patient has transportation. Gave directions, instructed to utilize valet parking.

## 2024-01-07 ENCOUNTER — Ambulatory Visit: Attending: Family | Admitting: Family

## 2024-01-07 ENCOUNTER — Encounter

## 2024-01-07 ENCOUNTER — Ambulatory Visit (HOSPITAL_COMMUNITY): Payer: Self-pay

## 2024-01-07 VITALS — BP 134/68 | HR 61 | Wt 290.0 lb

## 2024-01-07 DIAGNOSIS — Z5941 Food insecurity: Secondary | ICD-10-CM | POA: Insufficient documentation

## 2024-01-07 DIAGNOSIS — I5032 Chronic diastolic (congestive) heart failure: Secondary | ICD-10-CM | POA: Insufficient documentation

## 2024-01-07 DIAGNOSIS — I4891 Unspecified atrial fibrillation: Secondary | ICD-10-CM

## 2024-01-07 DIAGNOSIS — Z833 Family history of diabetes mellitus: Secondary | ICD-10-CM | POA: Diagnosis not present

## 2024-01-07 DIAGNOSIS — I493 Ventricular premature depolarization: Secondary | ICD-10-CM | POA: Diagnosis not present

## 2024-01-07 DIAGNOSIS — Z79899 Other long term (current) drug therapy: Secondary | ICD-10-CM | POA: Diagnosis not present

## 2024-01-07 DIAGNOSIS — E119 Type 2 diabetes mellitus without complications: Secondary | ICD-10-CM | POA: Insufficient documentation

## 2024-01-07 DIAGNOSIS — I10 Essential (primary) hypertension: Secondary | ICD-10-CM | POA: Diagnosis not present

## 2024-01-07 DIAGNOSIS — Z83438 Family history of other disorder of lipoprotein metabolism and other lipidemia: Secondary | ICD-10-CM | POA: Insufficient documentation

## 2024-01-07 DIAGNOSIS — I110 Hypertensive heart disease with heart failure: Secondary | ICD-10-CM | POA: Diagnosis present

## 2024-01-07 DIAGNOSIS — G4733 Obstructive sleep apnea (adult) (pediatric): Secondary | ICD-10-CM | POA: Insufficient documentation

## 2024-01-07 DIAGNOSIS — Z7985 Long-term (current) use of injectable non-insulin antidiabetic drugs: Secondary | ICD-10-CM | POA: Insufficient documentation

## 2024-01-07 DIAGNOSIS — E785 Hyperlipidemia, unspecified: Secondary | ICD-10-CM | POA: Diagnosis not present

## 2024-01-07 DIAGNOSIS — Z5986 Financial insecurity: Secondary | ICD-10-CM | POA: Insufficient documentation

## 2024-01-07 DIAGNOSIS — Z8249 Family history of ischemic heart disease and other diseases of the circulatory system: Secondary | ICD-10-CM | POA: Insufficient documentation

## 2024-01-07 DIAGNOSIS — Z7984 Long term (current) use of oral hypoglycemic drugs: Secondary | ICD-10-CM | POA: Diagnosis not present

## 2024-01-07 NOTE — Patient Instructions (Signed)
 Great to see you today!!!  Medication Changes:  None, continue current medications   Special Instructions // Education:  Do the following things EVERYDAY: Weigh yourself in the morning before breakfast. Write it down and keep it in a log. Take your medicines as prescribed Eat low salt foods--Limit salt (sodium) to 2000 mg per day.  Stay as active as you can everyday Limit all fluids for the day to less than 2 liters   Follow-Up in: 3 months    If you have any questions or concerns before your next appointment please send us  a message through [place] or call our office at [phone], If it is after office hours your call will be answered by our answering service and directed appropriately.     At the [facility], you and your health needs are our priority. We have a designated team specialized in the treatment of Heart Failure. This Care Team includes your primary Heart Failure Specialized Cardiologist (physician), Advanced Practice Providers (APPs- Physician Assistants and Nurse Practitioners), and Pharmacist who all work together to provide you with the care you need, when you need it.   You may see any of the following providers on your designated Care Team at your next follow up:  Dr. Jules Oar Dr. Peder Bourdon Dr. Alwin Baars Dr. Judyth Nunnery Nieves Bars, NP Ruddy Corral, [place] [address] [place], [place] Dennise Fitz, NP [place] Lee, NP [place] Dellen, NP Bevely Brush, PharmD

## 2024-01-08 ENCOUNTER — Encounter: Payer: Self-pay | Admitting: Family

## 2024-01-09 ENCOUNTER — Encounter: Admitting: *Deleted

## 2024-01-09 DIAGNOSIS — I5032 Chronic diastolic (congestive) heart failure: Secondary | ICD-10-CM

## 2024-01-09 NOTE — Progress Notes (Signed)
 Daily Session Note  Patient Details  Name: Heather Norman MRN: [id number] Date of Birth: 1975/08/13 Referring Provider:   Flowsheet Row Pulmonary Rehab from 10/22/2023 in [facility] Cardiac and Pulmonary Rehab  Referring Provider Dr. Belva Boyden, MD       Encounter Date: 01/09/2024  Check In:  Session Check In - 01/09/24 1722       Check-In   Supervising physician immediately available to respond to emergencies See telemetry face sheet for immediately available ER MD    Location [facility]-Cardiac & Pulmonary Rehab    Staff Present Sue Em RN,BSN;Susanne Bice, RN, BSN, CCRP;Kelly Bollinger RN,BSN,MPA    Virtual Visit No    Medication changes reported     No    Fall or balance concerns reported    No    Warm-up and Cool-down Performed on first and last piece of equipment    Resistance Training Performed Yes    VAD Patient? No    PAD/SET Patient? No      Pain Assessment   Currently in Pain? No/denies                Social History   Tobacco Use  Smoking Status Never  Smokeless Tobacco Never    Goals Met:  Independence with exercise equipment Exercise tolerated well No report of concerns or symptoms today Strength training completed today  Goals Unmet:  Not Applicable  Comments: Pt able to follow exercise prescription today without complaint.  Will continue to monitor for progression.    Dr. Firman Hughes is Medical Director for [facility] Cardiac Rehabilitation.  Dr. Fuad Aleskerov is Medical Director for [facility] Pulmonary Rehabilitation.

## 2024-01-10 ENCOUNTER — Other Ambulatory Visit: Payer: Self-pay | Admitting: Family

## 2024-01-10 ENCOUNTER — Ambulatory Visit: Admitting: Obstetrics

## 2024-01-10 ENCOUNTER — Encounter: Admitting: *Deleted

## 2024-01-10 DIAGNOSIS — I5032 Chronic diastolic (congestive) heart failure: Secondary | ICD-10-CM | POA: Diagnosis not present

## 2024-01-10 NOTE — Progress Notes (Deleted)
    GYNECOLOGY PROGRESS NOTE  Subjective:  PCP: Evette Hoes, NP  Patient ID: Heather Norman, female    DOB: 11/29/1975, 48 y.o.   MRN: [id number]  HPI  Patient is a 48 y.o. [id number] female who presents for follow up from starting [place]  .   {Common ambulatory SmartLinks:19316}  Review of Systems {ros; complete:30496}   Objective:   There were no vitals taken for this visit. There is no height or weight on file to calculate BMI.  General appearance: {general exam:16600} Abdomen: {abdominal exam:16834} Pelvic: {pelvic exam:16852::cervix normal in appearance,external genitalia normal,no adnexal masses or tenderness,no cervical motion tenderness,rectovaginal septum normal,uterus normal size, shape, and consistency,vagina normal without discharge} Extremities: {extremity exam:5109} Neurologic: {neuro exam:17854}   Assessment/Plan:   No diagnosis found.   There are no diagnoses linked to this encounter.     Heather Dunn, DO [facility]

## 2024-01-10 NOTE — Progress Notes (Signed)
 Daily Session Note  Patient Details  Name: Heather Norman MRN: [id number] Date of Birth: [name] 14, 1976 Referring Provider:   Flowsheet Row Pulmonary Rehab from 10/22/2023 in [facility] Cardiac and Pulmonary Rehab  Referring Provider Dr. Belva Boyden, MD    Encounter Date: 01/10/2024  Check In:  Session Check In - 01/10/24 1724       Check-In   Supervising physician immediately available to respond to emergencies See telemetry face sheet for immediately available ER MD    Location [facility]-Cardiac & Pulmonary Rehab    Staff Present Sue Em RN,BSN;Margaret Best, MS, Exercise Physiologist;Maxon Conetta BS, Exercise Physiologist    Virtual Visit No    Medication changes reported     No    Fall or balance concerns reported    No    Warm-up and Cool-down Performed on first and last piece of equipment    Resistance Training Performed Yes    VAD Patient? No    PAD/SET Patient? No      Pain Assessment   Currently in Pain? No/denies             Social History   Tobacco Use  Smoking Status Never  Smokeless Tobacco Never    Goals Met:  Independence with exercise equipment Exercise tolerated well No report of concerns or symptoms today Strength training completed today  Goals Unmet:  Not Applicable  Comments: Pt able to follow exercise prescription today without complaint.  Will continue to monitor for progression.    Dr. Firman Hughes is Medical Director for [facility] Cardiac Rehabilitation.  Dr. Fuad Aleskerov is Medical Director for [facility] Pulmonary Rehabilitation.

## 2024-01-14 ENCOUNTER — Encounter: Admitting: *Deleted

## 2024-01-14 DIAGNOSIS — I5032 Chronic diastolic (congestive) heart failure: Secondary | ICD-10-CM

## 2024-01-14 NOTE — Progress Notes (Signed)
 Daily Session Note  Patient Details  Name: STARLETT PEHRSON MRN: [id number] Date of Birth: 08-16-1975 Referring Provider:   Flowsheet Row Pulmonary Rehab from 10/22/2023 in [facility] Cardiac and Pulmonary Rehab  Referring Provider Dr. Belva Boyden, MD    Encounter Date: 01/14/2024  Check In:  Session Check In - 01/14/24 1127       Check-In   Supervising physician immediately available to respond to emergencies See telemetry face sheet for immediately available ER MD    Location [facility]-Cardiac & Pulmonary Rehab    Staff Present Maud Sorenson, RN, BSN, CCRP;Joseph Hood RCP,RRT,BSRT;Maxon [place] BS, Exercise Physiologist;Kelly [employer], ACSM CEP, Exercise Physiologist    Virtual Visit No    Medication changes reported     No    Fall or balance concerns reported    No    Warm-up and Cool-down Performed on first and last piece of equipment    Resistance Training Performed Yes    VAD Patient? No    PAD/SET Patient? No      Pain Assessment   Currently in Pain? No/denies             Social History   Tobacco Use  Smoking Status Never  Smokeless Tobacco Never    Goals Met:  Proper associated with RPD/PD & O2 Sat Independence with exercise equipment Exercise tolerated well No report of concerns or symptoms today  Goals Unmet:  Not Applicable  Comments: Pt able to follow exercise prescription today without complaint.  Will continue to monitor for progression.    Dr. Firman Hughes is Medical Director for [facility] Cardiac Rehabilitation.  Dr. Fuad Aleskerov is Medical Director for [facility] Pulmonary Rehabilitation.

## 2024-01-15 ENCOUNTER — Encounter: Payer: Self-pay | Admitting: Obstetrics

## 2024-01-16 ENCOUNTER — Encounter: Admitting: *Deleted

## 2024-01-16 DIAGNOSIS — I5032 Chronic diastolic (congestive) heart failure: Secondary | ICD-10-CM

## 2024-01-16 NOTE — Progress Notes (Signed)
 Daily Session Note  Patient Details  Name: Heather Norman MRN: [id number] Date of Birth: 04/25/1976 Referring Provider:   Flowsheet Row Pulmonary Rehab from 10/22/2023 in [facility] Cardiac and Pulmonary Rehab  Referring Provider Dr. Belva Boyden, MD    Encounter Date: 01/16/2024  Check In:  Session Check In - 01/16/24 1740       Check-In   Supervising physician immediately available to respond to emergencies See telemetry face sheet for immediately available ER MD    Location [facility]-Cardiac & Pulmonary Rehab    Staff Present Sue Em RN,BSN;Joseph [facility] Sabra Cramp BS, ACSM CEP, Exercise Physiologist    Virtual Visit No    Medication changes reported     No    Fall or balance concerns reported    No    Warm-up and Cool-down Performed on first and last piece of equipment    Resistance Training Performed Yes    VAD Patient? No    PAD/SET Patient? No      Pain Assessment   Currently in Pain? No/denies             Social History   Tobacco Use  Smoking Status Never  Smokeless Tobacco Never    Goals Met:  Independence with exercise equipment Exercise tolerated well No report of concerns or symptoms today Strength training completed today  Goals Unmet:  Not Applicable  Comments: Pt able to follow exercise prescription today without complaint.  Will continue to monitor for progression.    Dr. Firman Hughes is Medical Director for [facility] Cardiac Rehabilitation.  Dr. Fuad Aleskerov is Medical Director for [facility] Pulmonary Rehabilitation.

## 2024-01-17 ENCOUNTER — Encounter: Admitting: *Deleted

## 2024-01-17 ENCOUNTER — Telehealth: Payer: Self-pay

## 2024-01-17 ENCOUNTER — Telehealth: Payer: Self-pay | Admitting: Family

## 2024-01-17 VITALS — Ht 66.5 in | Wt 299.0 lb

## 2024-01-17 DIAGNOSIS — I5032 Chronic diastolic (congestive) heart failure: Secondary | ICD-10-CM | POA: Diagnosis not present

## 2024-01-17 MED ORDER — TORSEMIDE 20 MG PO TABS: 60.0000 mg | ORAL_TABLET | Freq: Two times a day (BID) | ORAL | 6 refills | Status: DC

## 2024-01-17 NOTE — Telephone Encounter (Signed)
 Please call and instruct to take torsemide   60 mg twice a day.  Ask to take an extra 2.5 mg metolazone  today.   Make follow up with Brian Campanile next week.  Thanks Jazmina Muhlenkamp  Alfretta Pinch NP-C  3:52 PM

## 2024-01-17 NOTE — Patient Instructions (Signed)
 Discharge Patient Instructions  Patient Details  Name: Heather Norman MRN: [id number] Date of Birth: 10-06-1975 Referring Provider:  Evette Hoes, NP  Number of Visits: 36/36  Reason for Discharge:  Patient reached a stable level of exercise. Patient independent in their exercise. Patient has met program and personal goals.  Diagnosis:  Heart failure, diastolic, chronic (HCC)  Initial Exercise Prescription:  Initial Exercise Prescription - 10/22/23 1500       Date of Initial Exercise RX and Referring Provider   Date 10/22/23    Referring Provider Dr. Timothy Gollan, MD      Oxygen   Maintain Oxygen Saturation 88% or higher      Treadmill   MPH 1.8    Grade 0.5    Minutes 15    METs 2.5      Recumbant Bike   Level 1    RPM 50    Watts 19    Minutes 15    METs 2.46      NuStep   Level 2    SPM 80    Minutes 15    METs 2.46      Prescription Details   Frequency (times per week) 3    Duration Progress to 30 minutes of continuous aerobic without signs/symptoms of physical distress      Intensity   THRR 40-80% of Max Heartrate 103-148    Ratings of Perceived Exertion 11-13    Perceived Dyspnea 0-4      Progression   Progression Continue to progress workloads to maintain intensity without signs/symptoms of physical distress.      Resistance Training   Training Prescription Yes    Weight 3 lb    Reps 10-15         Discharge Exercise Prescription (Final Exercise Prescription Changes):  Exercise Prescription Changes - 01/10/24 1000       Response to Exercise   Blood Pressure (Admit) 102/62    Blood Pressure (Exit) 104/62    Heart Rate (Admit) 80 bpm    Heart Rate (Exercise) 108 bpm    Heart Rate (Exit) 90 bpm    Oxygen Saturation (Admit) 96 %    Oxygen Saturation (Exercise) 95 %    Oxygen Saturation (Exit) 96 %    Rating of Perceived Exertion (Exercise) 12    Perceived Dyspnea (Exercise) 1    Symptoms none    Duration Continue with 30 min of  aerobic exercise without signs/symptoms of physical distress.    Intensity THRR unchanged      Progression   Progression Continue to progress workloads to maintain intensity without signs/symptoms of physical distress.    Average METs 2.65      Resistance Training   Training Prescription Yes    Weight 3 lb    Reps 10-15      Interval Training   Interval Training No      Treadmill   MPH 1.6    Grade 5    Minutes 15    METs 3.33      NuStep   Level 4    Minutes 15    METs 2      Biostep-RELP   Level 2    SPM 50    Minutes 15    METs 2      Home Exercise Plan   Plans to continue exercise at Home (comment)   walking   Frequency Add 2 additional days to program exercise sessions.    Initial Home Exercises  Provided 12/12/23      Oxygen   Maintain Oxygen Saturation 88% or higher         Functional Capacity:  6 Minute Walk     Row Name 10/22/23 1534 01/17/24 1738       6 Minute Walk   Phase Initial Discharge    Distance 940 feet 1350 feet    Distance % Change -- 410 %    Distance Feet Change -- 410 ft    Walk Time 6 minutes 6 minutes    # of Rest Breaks 0 0    MPH 1.78 2.56    METS 2.46 2.99    RPE 11 12    Perceived Dyspnea  0 0    VO2 Peak 8.6 10.47    Symptoms Yes (comment) No    Comments burning in legs --    Resting HR 58 bpm 74 bpm    Resting BP 112/72 104/60    Resting Oxygen Saturation  94 % 95 %    Exercise Oxygen Saturation  during 6 min walk 93 % 90 %    Max Ex. HR 119 bpm 104 bpm    Max Ex. BP 124/76 128/64    2 Minute Post BP 106/70 112/60      Interval HR   1 Minute HR 111 100    2 Minute HR 101 101    3 Minute HR 108 100    4 Minute HR 84 102    5 Minute HR 108 100    6 Minute HR 119 104    2 Minute Post HR 78 68    Interval Heart Rate? Yes Yes      Interval Oxygen   Interval Oxygen? Yes Yes    Baseline Oxygen Saturation % 94 % 95 %    1 Minute Oxygen Saturation % 93 % 94 %    1 Minute Liters of Oxygen 0 L  RA 0 L    2  Minute Oxygen Saturation % 94 % 95 %    2 Minute Liters of Oxygen 0 L 0 L    3 Minute Oxygen Saturation % 93 % 90 %    3 Minute Liters of Oxygen 0 L 0 L    4 Minute Oxygen Saturation % 95 % 91 %    4 Minute Liters of Oxygen 0 L 0 L    5 Minute Oxygen Saturation % 94 % 91 %    5 Minute Liters of Oxygen 0 L 0 L    6 Minute Oxygen Saturation % 94 % 95 %    6 Minute Liters of Oxygen 0 L 0 L    2 Minute Post Oxygen Saturation % 95 % 95 %    2 Minute Post Liters of Oxygen 0 L 0 L      Nutrition & Weight - Outcomes:  Pre Biometrics - 10/22/23 1537       Pre Biometrics   Height 5' 6.5 (1.689 m)    Weight 290 lb 14.4 oz (132 kg)    Waist Circumference 50.5 inches    Hip Circumference 55 inches    Waist to Hip Ratio 0.92 %    BMI (Calculated) 46.25    Single Leg Stand 3.45 seconds          Post Biometrics - 01/17/24 1745        Post  Biometrics   Height 5' 6.5 (1.689 m)    Weight 299 lb (135.6 kg)  BMI (Calculated) 47.54    Single Leg Stand 2.85 seconds         Nutrition:  Nutrition Therapy & Goals - 10/22/23 1449       Nutrition Therapy   Protein (specify units) 70-90    Fiber 25 grams    Whole Grain Foods 3 servings    Saturated Fats 15 max. grams    Fruits and Vegetables 5 servings/day    Sodium 2 grams      Personal Nutrition Goals   Nutrition Goal Eat 15-30gProtein and 30-60gCarbs at each meal.    Personal Goal #2 Read labels and reduce sodium intake to below 2300mg . Ideally 1500mg  per day.    Personal Goal #3 Include more colorful produce, aim for 5-8 servings of fruits and veggies per day    Comments Patient drinking less sugary beverages. Has been trying to better control her DM. Discussed some of her meals. Breakfast being oatmeal with cranberries and raisin with a fruit smoothie. Together walked through her breakfast highlighting  her high carb intake and lack of protein. Provided handout on pairing carbs with protein. Educated on carb sources and set  goal of eating ~30-60g per meal. Reviewed mediterranean diet handout and educated on types of fats sources and how to read facts labels. She would like to meal prep more and include more veggies at meals.      Intervention Plan   Intervention Nutrition handout(s) given to patient.;Prescribe, educate and counsel regarding individualized specific dietary modifications aiming towards targeted core components such as weight, hypertension, lipid management, diabetes, heart failure and other comorbidities.    Expected Outcomes Long Term Goal: Adherence to prescribed nutrition plan.;Short Term Goal: A plan has been developed with personal nutrition goals set during dietitian appointment.;Short Term Goal: Understand basic principles of dietary content, such as calories, fat, sodium, cholesterol and nutrients.

## 2024-01-17 NOTE — Telephone Encounter (Signed)
 Pt called stating that she gain 10lbs in 2 days. Pt denies any swelling or shortness of breath. She states she weighed 289lbs at pulmonary rehab Monday and 298lbs on Wednesday at rehab. She states she went home and took an additional 20mg  (1/2 tab) of torsemide  last night and this morning she weighed 294lbs. Advice?

## 2024-01-17 NOTE — Telephone Encounter (Signed)
 Spoke to pt. She is agreeable to changes to torsemide  but decline to take the Metolazone  today because she has pulm rehab this evening. She is going to take her normal metolazone  tomorrow and stick with the increase of torsemide . Follow up made for next week. No further questions at this time.

## 2024-01-17 NOTE — Addendum Note (Signed)
 Addended by: Autry Legions A on: 01/17/2024 04:25 PM   Modules accepted: Orders

## 2024-01-17 NOTE — Progress Notes (Signed)
 Daily Session Note  Patient Details  Name: Heather Norman MRN: [id number] Date of Birth: 11-08-1975 Referring Provider:   Flowsheet Row Pulmonary Rehab from 10/22/2023 in [facility] Card[facility] Pulmonary Rehab  Referring Provider Dr. Belva Boyden, MD    Encounter Date: 01/17/2024  Check In:  Session Check In - 01/17/24 1713       Check-In   Supervising physician immediately available to respond to emergencies See telemetry face sheet for immediately available ER MD    Location [facility]-Cardiac & Pulmonary Rehab    Staff Present Sue Em RN,BSN;[facility] BS, Exercise Physiologist;Laureen Bevin Bucks, BS, RRT, CPFT    Virtual Visit No    Medication changes reported     Yes    Comments added torsemide  daily    Fall or balance concerns reported    No    Warm-up and Cool-down Performed on first and last piece of equipment    Resistance Training Performed Yes    VAD Patient? No    PAD/SET Patient? No      Pain Assessment   Currently in Pain? No/denies             Social History   Tobacco Use  Smoking Status Never  Smokeless Tobacco Never    Goals Met:  Independence with exercise equipment Exercise tolerated well No report of concerns or symptoms today Strength training completed today  Goals Unmet:  Not Applicable  Comments:   6 Minute Walk     Row Name 10/22/23 1534 01/17/24 1738       6 Minute Walk   Phase Initial Discharge    Distance 940 feet 1350 feet    Distance % Change -- 410 %    Distance Feet Change -- 410 ft    Walk Time 6 minutes 6 minutes    # of Rest Breaks 0 0    MPH 1.78 2.56    METS 2.46 2.99    RPE 11 12    Perceived Dyspnea  0 0    VO2 Peak 8.6 10.47    Symptoms Yes (comment) No    Comments burning in legs --    Resting HR 58 bpm 74 bpm    Resting BP 112/72 104/60    Resting Oxygen Saturation  94 % 95 %    Exercise Oxygen Saturation  during 6 min walk 93 % 90 %    Max Ex. HR 119 bpm 104 bpm    Max Ex. BP  124/76 128/64    2 Minute Post BP 106/70 112/60      Interval HR   1 Minute HR 111 100    2 Minute HR 101 101    3 Minute HR 108 100    4 Minute HR 84 102    5 Minute HR 108 100    6 Minute HR 119 104    2 Minute Post HR 78 68    Interval Heart Rate? Yes Yes      Interval Oxygen   Interval Oxygen? Yes Yes    Baseline Oxygen Saturation % 94 % 95 %    1 Minute Oxygen Saturation % 93 % 94 %    1 Minute Liters of Oxygen 0 L  RA 0 L    2 Minute Oxygen Saturation % 94 % 95 %    2 Minute Liters of Oxygen 0 L 0 L    3 Minute Oxygen Saturation % 93 % 90 %    3  Minute Liters of Oxygen 0 L 0 L    4 Minute Oxygen Saturation % 95 % 91 %    4 Minute Liters of Oxygen 0 L 0 L    5 Minute Oxygen Saturation % 94 % 91 %    5 Minute Liters of Oxygen 0 L 0 L    6 Minute Oxygen Saturation % 94 % 95 %    6 Minute Liters of Oxygen 0 L 0 L    2 Minute Post Oxygen Saturation % 95 % 95 %    2 Minute Post Liters of Oxygen 0 L 0 L      Pt able to follow exercise prescription today without complaint.  Will continue to monitor for progression.    Dr. Firman Hughes is Medical Director for [facility] Cardiac Rehabilitation.  Dr. Fuad Aleskerov is Medical Director for [facility] Pulmonary Rehabilitation.

## 2024-01-21 ENCOUNTER — Encounter: Admitting: *Deleted

## 2024-01-21 DIAGNOSIS — I5032 Chronic diastolic (congestive) heart failure: Secondary | ICD-10-CM

## 2024-01-21 NOTE — Progress Notes (Signed)
 Daily Session Note  Patient Details  Name: Heather Norman MRN: [id number] Date of Birth: [birth year] Referring Provider:   Flowsheet Row Pulmonary Rehab from 10/22/2023 in [facility] Pulmonary Rehab  Referring Provider Dr. Evalene Lunger, MD    Encounter Date: 01/21/2024  Check In:  Session Check In - 01/21/24 1116       Check-In   Supervising physician immediately available to respond to emergencies See telemetry face sheet for immediately available ER MD    Location [facility]-Cardiac & Pulmonary Rehab    Staff Present Othel Durand, RN, BSN, CCRP;Meredith Tressa RN,BSN;Kelly Evanston BS, ACSM CEP, Exercise Physiologist;Kelly Bollinger [facility]    Virtual Visit No    Medication changes reported     No    Fall or balance concerns reported    No    Warm-up and Cool-down Performed on first and last piece of equipment    Resistance Training Performed Yes    VAD Patient? No    PAD/SET Patient? No      Pain Assessment   Currently in Pain? No/denies             Social History   Tobacco Use  Smoking Status Never  Smokeless Tobacco Never    Goals Met:  Proper associated with RPD/PD & O2 Sat Independence with exercise equipment Exercise tolerated well No report of concerns or symptoms today  Goals Unmet:  Not Applicable  Comments: Pt able to follow exercise prescription today without complaint.  Will continue to monitor for progression.    Dr. Oneil Pinal is Medical Director for [facility] Cardiac Rehabilitation.  Dr. Fuad Aleskerov is Medical Director for [facility] Pulmonary Rehabilitation.

## 2024-01-22 ENCOUNTER — Telehealth: Payer: Self-pay | Admitting: Family

## 2024-01-22 NOTE — Telephone Encounter (Signed)
 Called to confirm/remind patient of their appointment at the [facility] on 01/23/24.   Appointment:   [x] Confirmed  [] Left mess   [] No answer/No voice mail  [] VM Full/unable to leave message  [] Phone not in service  Patient reminded to bring all medications and/or complete list.  Confirmed patient has transportation. Gave directions, instructed to utilize valet parking.

## 2024-01-23 ENCOUNTER — Encounter: Admitting: *Deleted

## 2024-01-23 ENCOUNTER — Other Ambulatory Visit
Admission: RE | Admit: 2024-01-23 | Discharge: 2024-01-23 | Disposition: A | Discharge status: Home / Self Care | Source: Ambulatory Visit | Attending: Family | Admitting: Family

## 2024-01-23 ENCOUNTER — Encounter: Payer: Self-pay | Admitting: Family

## 2024-01-23 ENCOUNTER — Ambulatory Visit: Admitting: Family

## 2024-01-23 VITALS — BP 95/78 | HR 78 | Wt 298.0 lb

## 2024-01-23 DIAGNOSIS — I5032 Chronic diastolic (congestive) heart failure: Secondary | ICD-10-CM

## 2024-01-23 DIAGNOSIS — I493 Ventricular premature depolarization: Secondary | ICD-10-CM | POA: Diagnosis not present

## 2024-01-23 DIAGNOSIS — I10 Essential (primary) hypertension: Secondary | ICD-10-CM | POA: Diagnosis not present

## 2024-01-23 DIAGNOSIS — I4891 Unspecified atrial fibrillation: Secondary | ICD-10-CM | POA: Diagnosis not present

## 2024-01-23 DIAGNOSIS — G4733 Obstructive sleep apnea (adult) (pediatric): Secondary | ICD-10-CM

## 2024-01-23 LAB — BASIC METABOLIC PANEL WITH GFR
Anion gap: 9 (ref 5–15)
BUN: 42 mg/dL — ABNORMAL HIGH (ref 6–20)
CO2: 25 mmol/L (ref 22–32)
Calcium: 8.7 mg/dL — ABNORMAL LOW (ref 8.9–10.3)
Chloride: 100 mmol/L (ref 98–111)
Creatinine, Ser: 1.86 mg/dL — ABNORMAL HIGH (ref 0.44–1.00)
GFR, Estimated: 33 mL/min — ABNORMAL LOW (ref >60)
Glucose, Bld: 247 mg/dL — ABNORMAL HIGH (ref 70–99)
Potassium: 4.3 mmol/L (ref 3.5–5.1)
Sodium: 134 mmol/L — ABNORMAL LOW (ref 135–145)

## 2024-01-23 LAB — BRAIN NATRIURETIC PEPTIDE: B Natriuretic Peptide: 22.8 pg/mL (ref 0.0–100.0)

## 2024-01-23 MED ORDER — TORSEMIDE 20 MG PO TABS: ORAL_TABLET | ORAL | 6 refills | Status: DC

## 2024-01-23 NOTE — Progress Notes (Signed)
 Daily Session Note  Patient Details  Name: Heather Norman MRN: [id number] Date of Birth: 30-[name]-1976 Referring Provider:   Flowsheet Row Pulmonary Rehab from 10/22/2023 in [facility] Cardiac and Pulmonary Rehab  Referring Provider Dr. Evalene Lunger, MD    Encounter Date: 01/23/2024  Check In:  Session Check In - 01/23/24 1714       Check-In   Supervising physician immediately available to respond to emergencies See telemetry face sheet for immediately available ER MD    Location [facility]-Cardiac & Pulmonary Rehab    Staff Present Hoy Rodney RN,BSN;[facility] Dyane BS, ACSM CEP, Exercise Physiologist    Virtual Visit No    Medication changes reported     No    Fall or balance concerns reported    No    Warm-up and Cool-down Performed on first and last piece of equipment    Resistance Training Performed Yes    VAD Patient? No    PAD/SET Patient? No      Pain Assessment   Currently in Pain? No/denies             Social History   Tobacco Use  Smoking Status Never  Smokeless Tobacco Never    Goals Met:  Independence with exercise equipment Exercise tolerated well No report of concerns or symptoms today Strength training completed today  Goals Unmet:  Not Applicable  Comments: Pt able to follow exercise prescription today without complaint.  Will continue to monitor for progression.    Dr. Oneil Pinal is Medical Director for [facility] Cardiac Rehabilitation.  Dr. Fuad Aleskerov is Medical Director for [facility] Pulmonary Rehabilitation.

## 2024-01-23 NOTE — Patient Instructions (Signed)
 Medication Changes:  CHANGE Torsemide  to 80 mg in the morning and 60 mg in the evening  CHANGE Entresto  to 1/2 tab Twice daily   Lab Work:  Go over to the [facility]. Go pass the gift shop and have your blood work completed.  We will only call you if the results are abnormal or if the provider would like to make medication changes.   Follow-Up in: 1 week  At the [facility], you and your health needs are our priority. We have a designated team specialized in the treatment of Heart Failure. This Care Team includes your primary Heart Failure Specialized Cardiologist (physician), Advanced Practice Providers (APPs- Physician Assistants and Nurse Practitioners), and Pharmacist who all work together to provide you with the care you need, when you need it.   You may see any of the following providers on your designated Care Team at your next follow up:  Dr. Toribio Fuel Dr. Ezra Shuck Dr. Ria Commander Dr. Odis Brownie Ellouise Class, FNP Jaun Bash, RPH-CPP  Please be sure to bring in all your medications bottles to every appointment.   Need to Contact Us :  If you have any questions or concerns before your next appointment please send us  a message through [place] or call our office at [phone].    TO LEAVE A MESSAGE FOR THE NURSE SELECT OPTION 2, PLEASE LEAVE A MESSAGE INCLUDING: YOUR NAME DATE OF BIRTH CALL BACK NUMBER REASON FOR CALL**this is important as we prioritize the call backs  YOU WILL RECEIVE A CALL BACK THE SAME DAY AS LONG AS YOU CALL BEFORE 4:00 PM

## 2024-01-23 NOTE — Progress Notes (Signed)
 ADVANCED HF CLINIC NOTE   Primary Care: Osa Geralds, NP Primary Cardiologist: Evalene Lunger, MD  Chief Complaint: shortness of breath   HPI:  Heather Norman is a 48 y/o female with a history of arthritis, DM, hyperlipidemia, HTN, morbid obesity and chronic diastolic heart failure.   Zio 10/21 12.5% PVCs Saw EP Marny) and suggested possible AAD (flecainide or propafenone) if developed symptoms or LV dysfunction.   Echo 02/10/21 EF 50-55% G1DD   Echo 01/26/22 EF 55-60% mild LVH and trivial MR.  Sleep study 07/24/22: AHI 35.4 Sats down to 75%.  Possible AF during study lasting 4.5 hours. Zio placed    Zio patch 1/24  - Sinus rhythm. No AF - 44 runs NSVT (longest 8 beats)  - 21.3% PVCs (3 morphologies 9.7%, 7.3%, 4.3%)  Saw Dr. Cindie in 3/24. felt not to be ablation candidate due to obesity. Recommended continue carvedilol  as EF stable  Started on CPAP in 2024. Wore CPAP for awhile but then got bronchitis and stopped.   Seen in ER 10/30/22 for CP. Hstrop, ECG and d-Dimer all normal. BNP elevated 47 -> 480 so torsemide  increased.  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   Seen in ED on 08/15/23 for volume overload. Got IV lasix . Increased metolazone  for 1 to 2 x/week. Unfortunately developed low BP and AKI (Scr 1.5 -> 2.5) and admitted 08/22/23. Given IVF.   Echo 1/25 EF 60-65% G1DD  RV mildly down.   Seen in Urgent Care 06/25 and diagnosed with pneumonia.   She presents today for a HF follow-up visit with a chief complaint of minimal shortness of breath. Has associated dizziness since pneumonia. Has abdominal distention on both sides of abdomen & fatigue. Denies chest pain, palpitations, pedal edema or difficulty sleeping. Has resumed going to pulmonary rehab.   Has uterine fibroids and says that she may need surgery in the future.   Past Medical History:  Diagnosis Date   (HFpEF) heart failure with  preserved ejection fraction (HCC)    a. 04/2017 Echo: EF 55-60%, no rwma, Mild MR, mildly dil LA. Nl RV fxn; b. 06/2019 Echo: EF 60-65%, mod LVH. Gr2 DD. Mildly dil LA; c. 03/2023 Cath: Nl cors. mild PAH (PA 31/17 (26) w/ high output (CO/CI 8.4/3.5); d. 08/2023 Echo: EF 55-60%, no rwma, GrI DD, mildly reduced RV fxn, RVSP 21.6 mmHg. No significant valvular dzs.   Arthritis    kness/hands - no meds   Diabetes mellitus without complication (HCC)    DM (diabetes mellitus) (HCC) 05/11/2017   Dyspnea on exertion    Headache(784.0)    otc med prn   Heart murmur    History of cardiac cath    a. 03/2023 Cath: Nl cors.   Hyperlipidemia    no meds since pregnancy   Hypertension    Morbid obesity (HCC)    NSVT (nonsustained ventricular tachycardia) (HCC)    a. 07/2022 Zio: 44 runs NSVT, longest 8 beats, fastest 182. 3 brief SVT runs.  21.3% PVC burden.   PVC (premature ventricular contraction)    a. 07/2022 Zio: 21.3 PVCs, 9.6% couplets, 3.5% triplets, 44 brief runs of NSVT.    Current Outpatient Medications  Medication Sig Dispense Refill   albuterol  (VENTOLIN  HFA) 108 (90 Base) MCG/ACT inhaler Inhale 1-2 puffs into the lungs every 6 (six) hours as needed. 18 g 0   aluminum-magnesium hydroxide 200-200 MG/5ML suspension Take  15 mLs by mouth every 6 (six) hours as needed for indigestion.     aspirin  EC 81 MG tablet Take 81 mg by mouth daily.     atorvastatin  (LIPITOR) 20 MG tablet TAKE 1 TABLET(20 MG) BY MOUTH DAILY 90 tablet 3   carvedilol  (COREG ) 25 MG tablet Take 0.5 tablets (12.5 mg total) by mouth 2 (two) times daily with a meal. (Patient taking differently: Take 25 mg by mouth 2 (two) times daily with a meal.) 225 tablet 3   cetirizine (ZYRTEC) 10 MG tablet Take 10 mg by mouth daily.     Elagolix Sodium  (ORILISSA ) 150 MG TABS Take 1 tablet (150 mg total) by mouth daily. 30 tablet 2   empagliflozin  (JARDIANCE ) 25 MG TABS tablet Take 1 tablet (25 mg total) by mouth daily before breakfast. 30 tablet  6   famotidine  (PEPCID ) 20 MG tablet Take 1 tablet (20 mg total) by mouth 2 (two) times daily.     famotidine -calcium  carbonate-magnesium hydroxide (PEPCID  COMPLETE) 10-800-165 MG chewable tablet Chew 1 tablet by mouth daily as needed.     fluticasone  (FLONASE ) 50 MCG/ACT nasal spray Place 2 sprays into both nostrils daily.     latanoprost  (XALATAN ) 0.005 % ophthalmic solution Place 1 drop into both eyes at bedtime.     meclizine  (ANTIVERT ) 25 MG tablet Take 1 tablet (25 mg total) by mouth 3 (three) times daily as needed for dizziness or nausea. 30 tablet 1   metolazone  (ZAROXOLYN ) 2.5 MG tablet Take 1 tablet (2.5 mg total) by mouth once a week.     mexiletine (MEXITIL ) 150 MG capsule Take 2 capsules (300 mg total) by mouth 2 (two) times daily. 360 capsule 3   pantoprazole  (PROTONIX ) 40 MG tablet Take 1 tablet (40 mg total) by mouth daily. 90 tablet 3   potassium chloride  20 MEQ/15ML (10%) SOLN Take 60 mLs (80 mEq total) by mouth daily. And additional 60meq ( ) on the days you take the metolazone  473 mL 6   sacubitril -valsartan  (ENTRESTO ) 49-51 MG Take 1 tablet by mouth 2 (two) times daily. 180 tablet 3   Semaglutide ,0.25 or 0.5MG /DOS, (OZEMPIC , 0.25 OR 0.5 MG/DOSE,) 2 MG/3ML SOPN Inject 0.5 mg into the skin once a week. 3 mL 1   spironolactone  (ALDACTONE ) 25 MG tablet TAKE 1 TABLET(25 MG) BY MOUTH DAILY (Patient taking differently: Take 50 mg by mouth once.) 90 tablet 3   torsemide  (DEMADEX ) 20 MG tablet Take 3 tablets (60 mg total) by mouth 2 (two) times daily. 180 tablet 6   No current facility-administered medications for this visit.    Allergies  Allergen Reactions   Penicillins Anaphylaxis    Has patient had a PCN reaction causing immediate rash, facial/tongue/throat swelling, SOB or lightheadedness with hypotension: Yes Has patient had a PCN reaction causing severe rash involving mucus membranes or skin necrosis: No Has patient had a PCN reaction that required hospitalization:  No Has patient had a PCN reaction occurring within the last 10 years: No If all of the above answers are NO, then may proceed with Cephalosporin use.      Social History   Socioeconomic History   Marital status: Single    Spouse name: Not on file   Number of children: 3   Years of education: 5   Highest education level: 12th grade  Occupational History   Not on file  Tobacco Use   Smoking status: Never   Smokeless tobacco: Never  Vaping Use   Vaping status: Never Used  Substance and Sexual Activity   Alcohol use: No   Drug use: No   Sexual activity: Yes    Birth control/protection: Surgical  Other Topics Concern   Not on file  Social History Narrative   Not on file   Social Drivers of Health   Financial Resource Strain: Medium Risk (08/13/2017)   Overall Financial Resource Strain (CARDIA)    Difficulty of Paying Living Expenses: Somewhat hard  Food Insecurity: Food Insecurity Present (08/22/2023)   Hunger Vital Sign    Worried About Running Out of Food in the Last Year: Often true    Ran Out of Food in the Last Year: Sometimes true  Transportation Needs: No Transportation Needs (08/22/2023)   PRAPARE - Administrator, Civil Service (Medical): No    Lack of Transportation (Non-Medical): No  Physical Activity: Sufficiently Active (06/09/2019)   Exercise Vital Sign    Days of Exercise per Week: 5 days    Minutes of Exercise per Session: 150+ min  Stress: Stress Concern Present (08/13/2017)   [employer] of Occupational Health - Occupational Stress Questionnaire    Feeling of Stress : Rather much  Social Connections: Moderately Isolated (08/13/2017)   Social Connection and Isolation Panel    Frequency of Communication with Friends and Family: More than three times a week    Frequency of Social Gatherings with Friends and Family: Never    Attends Religious Services: Never    Database administrator or Organizations: No    Attends Banker  Meetings: Never    Marital Status: Never married  Intimate Partner Violence: Not At Risk (08/22/2023)   Humiliation, Afraid, Rape, and Kick questionnaire    Fear of Current or Ex-Partner: No    Emotionally Abused: No    Physically Abused: No    Sexually Abused: No      Family History  Problem Relation Age of Onset   Diabetes Mother    Hypertension Mother    Hyperlipidemia Mother    Hypertension Father    Breast cancer Maternal Grandmother 29   Vitals:   01/23/24 1408  BP: 95/78  Pulse: 78  SpO2: 98%  Weight: 298 lb (135.2 kg)   Wt Readings from Last 3 Encounters:  01/23/24 298 lb (135.2 kg)  01/17/24 299 lb (135.6 kg)  01/07/24 290 lb (131.5 kg)   Lab Results  Component Value Date   CREATININE 1.86 (H) 01/23/2024   CREATININE 1.83 (H) 10/18/2023   CREATININE 1.93 (H) 10/08/2023    PHYSICAL EXAM:  General: Well appearing. No resp difficulty HEENT: normal Neck: supple, no JVD Cor: Regular rhythm, rate. No rubs, gallops or murmurs Lungs: clear Abdomen: soft, nontender, distended. Extremities: no cyanosis, clubbing, rash, edema Neuro: alert & oriented X 3. Moves all 4 extremities w/o difficulty. Affect pleasant    ASSESSMENT & PLAN:   1. 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 driven by obesity and severe OSA - NHYA II-III - fluid up with weight gain and symptoms - weight up 8j pounds from last visit here 2 weeks ago - increase torsemide  to 80mg  AM/ 60mg  PM and continue metolazone  2.5mg  1x/week on Fridays - continue potassium 80meq daily and extra 60mg  with metolazone   - Continue carvedilol  25mg  bid for palpitations - Continue Jardiance  25mg  daily (DM dose)  - Continue spiro 50mg  daily - decrease Entresto  to 24/26mg  BID to allow BP room for increased diuretic -  Can consider Cardiomems if needed - cMRI has been denied by insurance even after appeal - BNP 10/08/23 was 37.1 -  proBNP today  2. HTN - BP 95/78 - BMET 12/17/23 reviewed: sodium 136, potassium 3.3, creatinine 2.33 & GFR 25 - BMET today - saw nephrology (Kolluru) 05/25   3. PVC's, frequent - Zio 10/21 12.5% PVCs Saw EP Marny) and suggested possible AAD (flecainide or propafenone) if developed symptoms or LV dysfunction.  - Zio patch 1/24. Sinus No AF. 44 runs NSVT (longest 8 beats). 21.3% PVCs (3 morphologies 9.7%, 7.3%, 4.3%) - suspect driven by OSA but could aslo be infiltrative process (? Sarcoid) -> cMRI has been denied even after appeal - Saw Dr. Cindie 3/24 felt not to be ablation candidate due to obesity. Recommended continue carvedilol  as EF stable - PVCs not improve with CPAP - Continue mexilitene 300 bid - Still awaiting cMRI (insurance has denied).    4. Possible AF  - noted on sleep study and AppleWatch - No AF on Zio -> suspect this is likely PVCs - saw EP (Riddle) 05/25  5. OSA, severe - Sleep study 07/24/22: AHI 35.4 Sats down to 75%.  - CPAP nightly  6. Morbid obesity - Continue GLP1 - has resumed pulmonary rehab   Return in 1 week, sooner if needed.   Heather DELENA Class, FNP  9:05 AM

## 2024-01-24 ENCOUNTER — Ambulatory Visit: Payer: Self-pay | Admitting: Family

## 2024-01-24 ENCOUNTER — Encounter

## 2024-01-24 DIAGNOSIS — I5032 Chronic diastolic (congestive) heart failure: Secondary | ICD-10-CM

## 2024-01-24 NOTE — Progress Notes (Signed)
 Daily Session Note  Patient Details  Name: COMFORT IVERSEN MRN: [id number] Date of Birth: 1975-11-20 Referring Provider:   Flowsheet Row Pulmonary Rehab [facility]  Referring Provider Dr. Evalene Lunger, MD    Encounter Date: 01/24/2024  Check In:  Session Check In - 01/24/24 1718       Check-In   Supervising physician immediately available to respond to emergencies See telemetry face sheet for immediately available ER MD    Location [facility]-Cardiac & Pulmonary Rehab    Staff Present Hoy Rodney RN,BSN;Joseph [facility] Best, MS, Exercise Physiologist    Virtual Visit No    Medication changes reported     No    Fall or balance concerns reported    No    Warm-up and Cool-down Performed on first and last piece of equipment    Resistance Training Performed Yes    VAD Patient? No      Pain Assessment   Currently in Pain? No/denies             Social History   Tobacco Use  Smoking Status Never  Smokeless Tobacco Never    Goals Met:  Independence with exercise equipment Exercise tolerated well No report of concerns or symptoms today Strength training completed today  Goals Unmet:  Not Applicable  Comments: Pt able to follow exercise prescription today without complaint.  Will continue to monitor for progression.    Dr. Oneil Pinal is Medical Director for [facility] Cardiac Rehabilitation.  Dr. Fuad Aleskerov is Medical Director for [facility] Pulmonary Rehabilitation.

## 2024-01-28 ENCOUNTER — Encounter

## 2024-01-28 DIAGNOSIS — I5032 Chronic diastolic (congestive) heart failure: Secondary | ICD-10-CM

## 2024-01-28 NOTE — Progress Notes (Signed)
 Daily Session Note  Patient Details  Name: Heather Norman MRN: [id number] Date of Birth: 08/03/1975 Referring Provider:   Flowsheet Row Pulmonary Rehab from 10/22/2023 in [facility] Cardiac and Pulmonary Rehab  Referring Provider Dr. Evalene Lunger, MD    Encounter Date: 01/28/2024  Check In:  Session Check In - 01/28/24 1114       Check-In   Supervising physician immediately available to respond to emergencies See telemetry face sheet for immediately available ER MD    Location [facility]-Cardiac & Pulmonary Rehab    Staff Present Hoy Rodney RN,BSN;[place] RN,BSN,MPA;Maxon Conetta BS, Exercise Physiologist;Joseph Rolinda RCP,RRT,BSRT    Virtual Visit No    Medication changes reported     No    Fall or balance concerns reported    No    Warm-up and Cool-down Performed on first and last piece of equipment    Resistance Training Performed Yes    VAD Patient? No    PAD/SET Patient? No      Pain Assessment   Currently in Pain? No/denies             Social History   Tobacco Use  Smoking Status Never  Smokeless Tobacco Never    Goals Met:  Independence with exercise equipment Exercise tolerated well No report of concerns or symptoms today Strength training completed today  Goals Unmet:  Not Applicable  Comments: Pt able to follow exercise prescription today without complaint.  Will continue to monitor for progression.    Dr. Oneil Pinal is Medical Director for [facility] Cardiac Rehabilitation.  Dr. Fuad Aleskerov is Medical Director for [facility] Pulmonary Rehabilitation.

## 2024-01-29 NOTE — Progress Notes (Unsigned)
 ADVANCED HF CLINIC NOTE   Primary Care: Osa Geralds, NP Primary Cardiologist: Evalene Lunger, MD  Chief Complaint: shortness of breath   HPI:  Heather Norman is a 48 y/o female with a history of arthritis, DM, hyperlipidemia, HTN, morbid obesity and chronic diastolic heart failure.   Zio 10/21 12.5% PVCs Saw EP Marny) and suggested possible AAD (flecainide or propafenone) if developed symptoms or LV dysfunction.   Echo 02/10/21 EF 50-55% G1DD   Echo 01/26/22 EF 55-60% mild LVH and trivial MR.  Sleep study 07/24/22: AHI 35.4 Sats down to 75%.  Possible AF during study lasting 4.5 hours. Zio placed    Zio patch 1/24  - Sinus rhythm. No AF - 44 runs NSVT (longest 8 beats)  - 21.3% PVCs (3 morphologies 9.7%, 7.3%, 4.3%)  Saw Dr. Cindie in 3/24. felt not to be ablation candidate due to obesity. Recommended continue carvedilol  as EF stable  Started on CPAP in 2024. Wore CPAP for awhile but then got bronchitis and stopped.   Seen in ER 10/30/22 for CP. Hstrop, ECG and d-Dimer all normal. BNP elevated 47 -> 480 so torsemide  increased.  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   Seen in ED on 08/15/23 for volume overload. Got IV lasix . Increased metolazone  for 1 to 2 x/week. Unfortunately developed low BP and AKI (Scr 1.5 -> 2.5) and admitted 08/22/23. Given IVF.   Echo 1/25 EF 60-65% G1DD  RV mildly down.   Seen in Urgent Care 06/25 and diagnosed with pneumonia.   She presents today for a HF follow-up visit with a chief complaint of minimal shortness of breath. Has associated dizziness since pneumonia. Has abdominal distention on both sides of abdomen & fatigue. Denies chest pain, palpitations, pedal edema or difficulty sleeping. Has resumed going to pulmonary rehab.   Has uterine fibroids and says that she may need surgery in the future.   Past Medical History:  Diagnosis Date   (HFpEF) heart failure with  preserved ejection fraction (HCC)    a. 04/2017 Echo: EF 55-60%, no rwma, Mild MR, mildly dil LA. Nl RV fxn; b. 06/2019 Echo: EF 60-65%, mod LVH. Gr2 DD. Mildly dil LA; c. 03/2023 Cath: Nl cors. mild PAH (PA 31/17 (26) w/ high output (CO/CI 8.4/3.5); d. 08/2023 Echo: EF 55-60%, no rwma, GrI DD, mildly reduced RV fxn, RVSP 21.6 mmHg. No significant valvular dzs.   Arthritis    kness/hands - no meds   Diabetes mellitus without complication (HCC)    DM (diabetes mellitus) (HCC) 05/11/2017   Dyspnea on exertion    Headache(784.0)    otc med prn   Heart murmur    History of cardiac cath    a. 03/2023 Cath: Nl cors.   Hyperlipidemia    no meds since pregnancy   Hypertension    Morbid obesity (HCC)    NSVT (nonsustained ventricular tachycardia) (HCC)    a. 07/2022 Zio: 44 runs NSVT, longest 8 beats, fastest 182. 3 brief SVT runs.  21.3% PVC burden.   PVC (premature ventricular contraction)    a. 07/2022 Zio: 21.3 PVCs, 9.6% couplets, 3.5% triplets, 44 brief runs of NSVT.    Current Outpatient Medications  Medication Sig Dispense Refill   albuterol  (VENTOLIN  HFA) 108 (90 Base) MCG/ACT inhaler Inhale 1-2 puffs into the lungs every 6 (six) hours as needed. 18 g 0   aluminum-magnesium hydroxide 200-200 MG/5ML suspension Take  15 mLs by mouth every 6 (six) hours as needed for indigestion.     aspirin  EC 81 MG tablet Take 81 mg by mouth daily.     atorvastatin  (LIPITOR) 20 MG tablet TAKE 1 TABLET(20 MG) BY MOUTH DAILY 90 tablet 3   carvedilol  (COREG ) 25 MG tablet Take 0.5 tablets (12.5 mg total) by mouth 2 (two) times daily with a meal. 225 tablet 3   cetirizine (ZYRTEC) 10 MG tablet Take 10 mg by mouth daily.     Elagolix Sodium  (ORILISSA ) 150 MG TABS Take 1 tablet (150 mg total) by mouth daily. 30 tablet 2   empagliflozin  (JARDIANCE ) 25 MG TABS tablet Take 1 tablet (25 mg total) by mouth daily before breakfast. 30 tablet 6   famotidine  (PEPCID ) 20 MG tablet Take 1 tablet (20 mg total) by mouth 2 (two)  times daily.     famotidine -calcium  carbonate-magnesium hydroxide (PEPCID  COMPLETE) 10-800-165 MG chewable tablet Chew 1 tablet by mouth daily as needed.     fluticasone  (FLONASE ) 50 MCG/ACT nasal spray Place 2 sprays into both nostrils daily.     latanoprost  (XALATAN ) 0.005 % ophthalmic solution Place 1 drop into both eyes at bedtime.     meclizine  (ANTIVERT ) 25 MG tablet Take 1 tablet (25 mg total) by mouth 3 (three) times daily as needed for dizziness or nausea. 30 tablet 1   metolazone  (ZAROXOLYN ) 2.5 MG tablet Take 1 tablet (2.5 mg total) by mouth once a week.     mexiletine (MEXITIL ) 150 MG capsule Take 2 capsules (300 mg total) by mouth 2 (two) times daily. 360 capsule 3   pantoprazole  (PROTONIX ) 40 MG tablet Take 1 tablet (40 mg total) by mouth daily. 90 tablet 3   potassium chloride  20 MEQ/15ML (10%) SOLN Take 60 mLs (80 mEq total) by mouth daily. And additional 60meq ( ) on the days you take the metolazone  473 mL 6   sacubitril -valsartan  (ENTRESTO ) 49-51 MG Take 1 tablet by mouth 2 (two) times daily. 180 tablet 3   Semaglutide ,0.25 or 0.5MG /DOS, (OZEMPIC , 0.25 OR 0.5 MG/DOSE,) 2 MG/3ML SOPN Inject 0.5 mg into the skin once a week. 3 mL 1   spironolactone  (ALDACTONE ) 25 MG tablet TAKE 1 TABLET(25 MG) BY MOUTH DAILY 90 tablet 3   torsemide  (DEMADEX ) 20 MG tablet Take 4 tablets (80 mg total) by mouth every morning AND 3 tablets (60 mg total) every evening. 180 tablet 6   No current facility-administered medications for this visit.    Allergies  Allergen Reactions   Penicillins Anaphylaxis    Has patient had a PCN reaction causing immediate rash, facial/tongue/throat swelling, SOB or lightheadedness with hypotension: Yes Has patient had a PCN reaction causing severe rash involving mucus membranes or skin necrosis: No Has patient had a PCN reaction that required hospitalization: No Has patient had a PCN reaction occurring within the last 10 years: No If all of the above answers are  NO, then may proceed with Cephalosporin use.      Social History   Socioeconomic History   Marital status: Single    Spouse name: Not on file   Number of children: 3   Years of education: 12   Highest education level: 12th grade  Occupational History   Not on file  Tobacco Use   Smoking status: Never   Smokeless tobacco: Never  Vaping Use   Vaping status: Never Used  Substance and Sexual Activity   Alcohol use: No   Drug use: No   Sexual activity:  Yes    Birth control/protection: Surgical  Other Topics Concern   Not on file  Social History Narrative   Not on file   Social Drivers of Health   Financial Resource Strain: Medium Risk (08/13/2017)   Overall Financial Resource Strain (CARDIA)    Difficulty of Paying Living Expenses: Somewhat hard  Food Insecurity: Food Insecurity Present (08/22/2023)   Hunger Vital Sign    Worried About Running Out of Food in the Last Year: Often true    Ran Out of Food in the Last Year: Sometimes true  Transportation Needs: No Transportation Needs (08/22/2023)   PRAPARE - Administrator, Civil Service (Medical): No    Lack of Transportation (Non-Medical): No  Physical Activity: Sufficiently Active (06/09/2019)   Exercise Vital Sign    Days of Exercise per Week: 5 days    Minutes of Exercise per Session: 150+ min  Stress: Stress Concern Present (08/13/2017)   [employer] of Occupational Health - Occupational Stress Questionnaire    Feeling of Stress : Rather much  Social Connections: Moderately Isolated (08/13/2017)   Social Connection and Isolation Panel    Frequency of Communication with Friends and Family: More than three times a week    Frequency of Social Gatherings with Friends and Family: Never    Attends Religious Services: Never    Database administrator or Organizations: No    Attends Banker Meetings: Never    Marital Status: Never married  Intimate Partner Violence: Not At Risk (08/22/2023)    Humiliation, Afraid, Rape, and Kick questionnaire    Fear of Current or Ex-Partner: No    Emotionally Abused: No    Physically Abused: No    Sexually Abused: No      Family History  Problem Relation Age of Onset   Diabetes Mother    Hypertension Mother    Hyperlipidemia Mother    Hypertension Father    Breast cancer Maternal Grandmother 50   There were no vitals filed for this visit.  Wt Readings from Last 3 Encounters:  01/23/24 298 lb (135.2 kg)  01/17/24 299 lb (135.6 kg)  01/07/24 290 lb (131.5 kg)   Lab Results  Component Value Date   CREATININE 1.86 (H) 01/23/2024   CREATININE 1.83 (H) 10/18/2023   CREATININE 1.93 (H) 10/08/2023    PHYSICAL EXAM:  General: Well appearing. No resp difficulty HEENT: normal Neck: supple, no JVD Cor: Regular rhythm, rate. No rubs, gallops or murmurs Lungs: clear Abdomen: soft, nontender, distended. Extremities: no cyanosis, clubbing, rash, edema Neuro: alert & oriented X 3. Moves all 4 extremities w/o difficulty. Affect pleasant    ASSESSMENT & PLAN:   1. 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 driven by obesity and severe OSA - NHYA II-III - fluid up with weight gain and symptoms - weight up 8j pounds from last visit here 2 weeks ago - increase torsemide  to 80mg  AM/ 60mg  PM and continue metolazone  2.5mg  1x/week on Fridays - continue potassium 80meq daily and extra 60mg  with metolazone   - Continue carvedilol  25mg  bid for palpitations - Continue Jardiance  25mg  daily (DM dose)  - Continue spiro 50mg  daily - decrease Entresto  to 24/26mg  BID to allow BP room for increased diuretic - Can consider Cardiomems if needed - cMRI has been denied by insurance even after appeal - BNP 10/08/23 was 37.1 - proBNP today  2. HTN - BP 95/78 - BMET  12/17/23 reviewed: sodium 136, potassium 3.3, creatinine 2.33 & GFR 25 - BMET today - saw nephrology (Kolluru)  05/25   3. PVC's, frequent - Zio 10/21 12.5% PVCs Saw EP Marny) and suggested possible AAD (flecainide or propafenone) if developed symptoms or LV dysfunction.  - Zio patch 1/24. Sinus No AF. 44 runs NSVT (longest 8 beats). 21.3% PVCs (3 morphologies 9.7%, 7.3%, 4.3%) - suspect driven by OSA but could aslo be infiltrative process (? Sarcoid) -> cMRI has been denied even after appeal - Saw Dr. Cindie 3/24 felt not to be ablation candidate due to obesity. Recommended continue carvedilol  as EF stable - PVCs not improve with CPAP - Continue mexilitene 300 bid - Still awaiting cMRI (insurance has denied).    4. Possible AF  - noted on sleep study and AppleWatch - No AF on Zio -> suspect this is likely PVCs - saw EP (Riddle) 05/25  5. OSA, severe - Sleep study 07/24/22: AHI 35.4 Sats down to 75%.  - CPAP nightly  6. Morbid obesity - Continue GLP1 - has resumed pulmonary rehab   Return in 1 week, sooner if needed.   Heather DELENA Class, FNP  2:44 PM

## 2024-01-30 ENCOUNTER — Encounter: Payer: Self-pay | Admitting: Family

## 2024-01-30 ENCOUNTER — Other Ambulatory Visit
Admission: RE | Admit: 2024-01-30 | Discharge: 2024-01-30 | Disposition: A | Discharge status: Home / Self Care | Source: Home / Self Care | Attending: Family | Admitting: Family

## 2024-01-30 ENCOUNTER — Ambulatory Visit: Admitting: Family

## 2024-01-30 ENCOUNTER — Encounter: Attending: Cardiovascular Disease

## 2024-01-30 ENCOUNTER — Encounter: Payer: Self-pay | Admitting: *Deleted

## 2024-01-30 ENCOUNTER — Ambulatory Visit: Payer: Self-pay | Admitting: Family

## 2024-01-30 ENCOUNTER — Other Ambulatory Visit: Payer: Self-pay | Admitting: Family

## 2024-01-30 VITALS — BP 120/73 | HR 75 | Wt 293.0 lb

## 2024-01-30 DIAGNOSIS — R002 Palpitations: Secondary | ICD-10-CM | POA: Insufficient documentation

## 2024-01-30 DIAGNOSIS — I110 Hypertensive heart disease with heart failure: Secondary | ICD-10-CM | POA: Insufficient documentation

## 2024-01-30 DIAGNOSIS — G4733 Obstructive sleep apnea (adult) (pediatric): Secondary | ICD-10-CM | POA: Insufficient documentation

## 2024-01-30 DIAGNOSIS — Z7984 Long term (current) use of oral hypoglycemic drugs: Secondary | ICD-10-CM | POA: Insufficient documentation

## 2024-01-30 DIAGNOSIS — Z8349 Family history of other endocrine, nutritional and metabolic diseases: Secondary | ICD-10-CM | POA: Insufficient documentation

## 2024-01-30 DIAGNOSIS — Z833 Family history of diabetes mellitus: Secondary | ICD-10-CM | POA: Insufficient documentation

## 2024-01-30 DIAGNOSIS — Z8249 Family history of ischemic heart disease and other diseases of the circulatory system: Secondary | ICD-10-CM | POA: Insufficient documentation

## 2024-01-30 DIAGNOSIS — Z7985 Long-term (current) use of injectable non-insulin antidiabetic drugs: Secondary | ICD-10-CM | POA: Insufficient documentation

## 2024-01-30 DIAGNOSIS — I5032 Chronic diastolic (congestive) heart failure: Secondary | ICD-10-CM

## 2024-01-30 DIAGNOSIS — Z5986 Financial insecurity: Secondary | ICD-10-CM | POA: Insufficient documentation

## 2024-01-30 DIAGNOSIS — Z79899 Other long term (current) drug therapy: Secondary | ICD-10-CM | POA: Insufficient documentation

## 2024-01-30 DIAGNOSIS — I493 Ventricular premature depolarization: Secondary | ICD-10-CM | POA: Insufficient documentation

## 2024-01-30 DIAGNOSIS — E119 Type 2 diabetes mellitus without complications: Secondary | ICD-10-CM | POA: Insufficient documentation

## 2024-01-30 DIAGNOSIS — Z5941 Food insecurity: Secondary | ICD-10-CM | POA: Insufficient documentation

## 2024-01-30 DIAGNOSIS — I4891 Unspecified atrial fibrillation: Secondary | ICD-10-CM | POA: Diagnosis not present

## 2024-01-30 DIAGNOSIS — E785 Hyperlipidemia, unspecified: Secondary | ICD-10-CM | POA: Insufficient documentation

## 2024-01-30 DIAGNOSIS — I10 Essential (primary) hypertension: Secondary | ICD-10-CM

## 2024-01-30 LAB — BASIC METABOLIC PANEL WITH GFR
Anion gap: 13 (ref 5–15)
BUN: 44 mg/dL — ABNORMAL HIGH (ref 6–20)
CO2: 24 mmol/L (ref 22–32)
Calcium: 9.5 mg/dL (ref 8.9–10.3)
Chloride: 98 mmol/L (ref 98–111)
Creatinine, Ser: 2.05 mg/dL — ABNORMAL HIGH (ref 0.44–1.00)
GFR, Estimated: 29 mL/min — ABNORMAL LOW (ref >60)
Glucose, Bld: 253 mg/dL — ABNORMAL HIGH (ref 70–99)
Potassium: 4.2 mmol/L (ref 3.5–5.1)
Sodium: 135 mmol/L (ref 135–145)

## 2024-01-30 MED ORDER — SPIRONOLACTONE 25 MG PO TABS: 50.0000 mg | ORAL_TABLET | Freq: Every day | ORAL | Status: AC

## 2024-01-30 NOTE — Progress Notes (Signed)
 Pulmonary Individual Treatment Plan  Patient Details  Name: Heather Norman MRN: [id number] Date of Birth: [birth year] Referring Provider:   Flowsheet Row Pulmonary Rehab from 10/22/2023 in [facility] Cardiac and Pulmonary Rehab  Referring Provider Dr. Evalene Lunger, MD    Initial Encounter Date:  Flowsheet Row Pulmonary Rehab from 10/22/2023 in [facility] Cardiac and Pulmonary Rehab  Date 10/22/23    Visit Diagnosis: Heart failure, diastolic, chronic (HCC)  Patient's Home Medications on Admission:  Current Outpatient Medications:    albuterol  (VENTOLIN  HFA) 108 (90 Base) MCG/ACT inhaler, Inhale 1-2 puffs into the lungs every 6 (six) hours as needed., Disp: 18 g, Rfl: 0   aluminum-magnesium hydroxide 200-200 MG/5ML suspension, Take 15 mLs by mouth every 6 (six) hours as needed for indigestion., Disp: , Rfl:    aspirin  EC 81 MG tablet, Take 81 mg by mouth daily., Disp: , Rfl:    atorvastatin  (LIPITOR) 20 MG tablet, TAKE 1 TABLET(20 MG) BY MOUTH DAILY, Disp: 90 tablet, Rfl: 3   carvedilol  (COREG ) 25 MG tablet, Take 0.5 tablets (12.5 mg total) by mouth 2 (two) times daily with a meal., Disp: 225 tablet, Rfl: 3   cetirizine (ZYRTEC) 10 MG tablet, Take 10 mg by mouth daily., Disp: , Rfl:    Elagolix Sodium  (ORILISSA ) 150 MG TABS, Take 1 tablet (150 mg total) by mouth daily., Disp: 30 tablet, Rfl: 2   empagliflozin  (JARDIANCE ) 25 MG TABS tablet, Take 1 tablet (25 mg total) by mouth daily before breakfast., Disp: 30 tablet, Rfl: 6   famotidine  (PEPCID ) 20 MG tablet, Take 1 tablet (20 mg total) by mouth 2 (two) times daily., Disp: , Rfl:    famotidine -calcium  carbonate-magnesium hydroxide (PEPCID  COMPLETE) 10-800-165 MG chewable tablet, Chew 1 tablet by mouth daily as needed., Disp: , Rfl:    fluticasone  (FLONASE ) 50 MCG/ACT nasal spray, Place 2 sprays into both nostrils daily., Disp: , Rfl:    latanoprost  (XALATAN ) 0.005 % ophthalmic solution, Place 1 drop into both eyes at bedtime., Disp: , Rfl:     meclizine  (ANTIVERT ) 25 MG tablet, Take 1 tablet (25 mg total) by mouth 3 (three) times daily as needed for dizziness or nausea., Disp: 30 tablet, Rfl: 1   metolazone  (ZAROXOLYN ) 2.5 MG tablet, Take 1 tablet (2.5 mg total) by mouth once a week., Disp: , Rfl:    mexiletine (MEXITIL ) 150 MG capsule, Take 2 capsules (300 mg total) by mouth 2 (two) times daily., Disp: 360 capsule, Rfl: 3   pantoprazole  (PROTONIX ) 40 MG tablet, Take 1 tablet (40 mg total) by mouth daily., Disp: 90 tablet, Rfl: 3   potassium chloride  20 MEQ/15ML (10%) SOLN, Take 60 mLs (80 mEq total) by mouth daily. And additional 60meq ( ) on the days you take the metolazone , Disp: 473 mL, Rfl: 6   sacubitril -valsartan  (ENTRESTO ) 49-51 MG, Take 1 tablet by mouth 2 (two) times daily., Disp: 180 tablet, Rfl: 3   Semaglutide ,0.25 or 0.5MG /DOS, (OZEMPIC , 0.25 OR 0.5 MG/DOSE,) 2 MG/3ML SOPN, Inject 0.5 mg into the skin once a week., Disp: 3 mL, Rfl: 1   spironolactone  (ALDACTONE ) 25 MG tablet, TAKE 1 TABLET(25 MG) BY MOUTH DAILY, Disp: 90 tablet, Rfl: 3   torsemide  (DEMADEX ) 20 MG tablet, Take 4 tablets (80 mg total) by mouth every morning AND 3 tablets (60 mg total) every evening., Disp: 180 tablet, Rfl: 6  Past Medical History: Past Medical History:  Diagnosis Date   (HFpEF) heart failure with preserved ejection fraction (HCC)    a. 04/2017 Echo: EF  55-60%, no rwma, Mild MR, mildly dil LA. Nl RV fxn; b. 06/2019 Echo: EF 60-65%, mod LVH. Gr2 DD. Mildly dil LA; c. 03/2023 Cath: Nl cors. mild PAH (PA 31/17 (26) w/ high output (CO/CI 8.4/3.5); d. 08/2023 Echo: EF 55-60%, no rwma, GrI DD, mildly reduced RV fxn, RVSP 21.6 mmHg. No significant valvular dzs.   Arthritis    kness/hands - no meds   Diabetes mellitus without complication (HCC)    DM (diabetes mellitus) (HCC) 05/11/2017   Dyspnea on exertion    Headache(784.0)    otc med prn   Heart murmur    History of cardiac cath    a. 03/2023 Cath: Nl cors.   Hyperlipidemia    no meds  since pregnancy   Hypertension    Morbid obesity (HCC)    NSVT (nonsustained ventricular tachycardia) (HCC)    a. 07/2022 Zio: 44 runs NSVT, longest 8 beats, fastest 182. 3 brief SVT runs.  21.3% PVC burden.   PVC (premature ventricular contraction)    a. 07/2022 [facility]: 21.3 PVCs, 9.6% couplets, 3.5% triplets, 44 brief runs of NSVT.    Tobacco Use: Social History   Tobacco Use  Smoking Status Never  Smokeless Tobacco Never    Labs: Review Flowsheet  More data may exist      Latest Ref Rng & Units 12/13/2012 01/14/2015 06/05/2020 03/28/2023 08/22/2023  Labs for ITP Cardiac and Pulmonary Rehab  Cholestrol 0 - 200 mg/dL 800  831  780  - -  LDL (calc) 0 - 99 mg/dL 869  893  855  - -  HDL-C >40 mg/dL 52  42  48  - -  Trlycerides <150 mg/dL 84  897  866  - -  Hemoglobin A1c 4.8 - 5.6 % 6.6  - - - 8.5   PH, Arterial 7.35 - 7.45 - - - 7.372  -  PCO2 arterial 32 - 48 mmHg - - - 38.3  -  Bicarbonate 20.0 - 28.0 mmol/L - - - 18.9  24.8  22.2  -  TCO2 22 - 32 mmol/L - - - 20  26  23   -  Acid-base deficit 0.0 - 2.0 mmol/L - - - 7.0  1.0  3.0  -  O2 Saturation % - - - 71  73  96  -    Details       Multiple values from one day are sorted in reverse-chronological order          Pulmonary Assessment Scores:  Pulmonary Assessment Scores     Row Name 10/22/23 1532 01/17/24 1738 01/21/24 1635     ADL UCSD   ADL Phase Entry -- Exit   SOB Score total 20 -- 18   Rest 0 -- 0   Walk 0 -- 2   Stairs 4 -- 3   Bath 0 -- 0   Dress 0 -- 0   Shop 0 -- 0     CAT Score   CAT Score 20 -- 13     mMRC Score   mMRC Score 2 1 --      UCSD: Self-administered rating of dyspnea associated with activities of daily living (ADLs) 6-point scale (0 = not at all to 5 = maximal or unable to do because of breathlessness)  Scoring Scores range from 0 to 120.  Minimally important difference is 5 units  CAT: CAT can identify the health impairment of COPD patients and is better correlated with  disease progression.  CAT  has a scoring range of zero to 40. The CAT score is classified into four groups of low (less than 10), medium (10 - 20), high (21-30) and very high (31-40) based on the impact level of disease on health status. A CAT score over 10 suggests significant symptoms.  A worsening CAT score could be explained by an exacerbation, poor medication adherence, poor inhaler technique, or progression of COPD or comorbid conditions.  CAT MCID is 2 points  mMRC: mMRC (Modified Medical Research Council) Dyspnea Scale is used to assess the degree of baseline functional disability in patients of respiratory disease due to dyspnea. No minimal important difference is established. A decrease in score of 1 point or greater is considered a positive change.   Pulmonary Function Assessment:   Exercise Target Goals: Exercise Program Goal: Individual exercise prescription set using results from initial 6 min walk test and THRR while considering  patient's activity barriers and safety.   Exercise Prescription Goal: Initial exercise prescription builds to 30-45 minutes a day of aerobic activity, 2-3 days per week.  Home exercise guidelines will be given to patient during program as part of exercise prescription that the participant will acknowledge.  Education: Aerobic Exercise: - Group verbal and visual presentation on the components of exercise prescription. Introduces F.I.T.T principle from ACSM for exercise prescriptions.  Reviews F.I.T.T. principles of aerobic exercise including progression. Written material given at graduation. Flowsheet Row Pulmonary Rehab from 01/23/2024 in [facility] Cardiac and Pulmonary Rehab  Education need identified 10/22/23  Date 12/26/23  Educator [facility]  Instruction Review Code 1- [employer] Understanding    Education: Resistance Exercise: - Group verbal and visual presentation on the components of exercise prescription. Introduces F.I.T.T principle from ACSM for  exercise prescriptions  Reviews F.I.T.T. principles of resistance exercise including progression. Written material given at graduation.    Education: Exercise & Equipment Safety: - Individual verbal instruction and demonstration of equipment use and safety with use of the equipment. Flowsheet Row Pulmonary Rehab from 01/23/2024 in [facility] Cardiac and Pulmonary Rehab  Date 10/22/23  Educator NT  Instruction Review Code 1- Verbalizes Understanding    Education: Exercise Physiology & General Exercise Guidelines: - Group verbal and written instruction with models to review the exercise physiology of the cardiovascular system and associated critical values. Provides general exercise guidelines with specific guidelines to those with heart or lung disease.  Flowsheet Row Pulmonary Rehab from 01/23/2024 in [facility] Cardiac and Pulmonary Rehab  Date 12/12/23  Educator [facility]  Instruction Review Code 1- [employer] Understanding    Education: Flexibility, Balance, Mind/Body Relaxation: - Group verbal and visual presentation with interactive activity on the components of exercise prescription. Introduces F.I.T.T principle from ACSM for exercise prescriptions. Reviews F.I.T.T. principles of flexibility and balance exercise training including progression. Also discusses the mind body connection.  Reviews various relaxation techniques to help reduce and manage stress (i.e. Deep breathing, progressive muscle relaxation, and visualization). Balance handout provided to take home. Written material given at graduation.   Activity Barriers & Risk Stratification:  Activity Barriers & Cardiac Risk Stratification - 10/22/23 1536       Activity Barriers & Cardiac Risk Stratification   Activity Barriers Shortness of Breath;Balance Concerns   Vertigo         6 Minute Walk:  6 Minute Walk     Row Name 10/22/23 1534 01/17/24 1738       6 Minute Walk   Phase Initial Discharge    Distance 940 feet 1350 feet  Distance % Change -- 410 %    Distance Feet Change -- 410 ft    Walk Time 6 minutes 6 minutes    # of Rest Breaks 0 0    MPH 1.78 2.56    METS 2.46 2.99    RPE 11 12    Perceived Dyspnea  0 0    VO2 Peak 8.6 10.47    Symptoms Yes (comment) No    Comments burning in legs --    Resting HR 58 bpm 74 bpm    Resting BP 112/72 104/60    Resting Oxygen Saturation  94 % 95 %    Exercise Oxygen Saturation  during 6 min walk 93 % 90 %    Max Ex. HR 119 bpm 104 bpm    Max Ex. BP 124/76 128/64    2 Minute Post BP 106/70 112/60      Interval HR   1 Minute HR 111 100    2 Minute HR 101 101    3 Minute HR 108 100    4 Minute HR 84 102    5 Minute HR 108 100    6 Minute HR 119 104    2 Minute Post HR 78 68    Interval Heart Rate? Yes Yes      Interval Oxygen   Interval Oxygen? Yes Yes    Baseline Oxygen Saturation % 94 % 95 %    1 Minute Oxygen Saturation % 93 % 94 %    1 Minute Liters of Oxygen 0 L  RA 0 L    2 Minute Oxygen Saturation % 94 % 95 %    2 Minute Liters of Oxygen 0 L 0 L    3 Minute Oxygen Saturation % 93 % 90 %    3 Minute Liters of Oxygen 0 L 0 L    4 Minute Oxygen Saturation % 95 % 91 %    4 Minute Liters of Oxygen 0 L 0 L    5 Minute Oxygen Saturation % 94 % 91 %    5 Minute Liters of Oxygen 0 L 0 L    6 Minute Oxygen Saturation % 94 % 95 %    6 Minute Liters of Oxygen 0 L 0 L    2 Minute Post Oxygen Saturation % 95 % 95 %    2 Minute Post Liters of Oxygen 0 L 0 L      Oxygen Initial Assessment:  Oxygen Initial Assessment - 10/11/23 1251       Home Oxygen   Home Oxygen Device None    Sleep Oxygen Prescription None    Home Exercise Oxygen Prescription None    Home Resting Oxygen Prescription None    Compliance with Home Oxygen Use Yes      Intervention   Short Term Goals To learn and demonstrate proper use of respiratory medications;To learn and demonstrate proper pursed lip breathing techniques or other breathing techniques.     Long  Term Goals  Demonstrates proper use of MDI's;Compliance with respiratory medication;Exhibits proper breathing techniques, such as pursed lip breathing or other method taught during program session          Oxygen Re-Evaluation:  Oxygen Re-Evaluation     Row Name 10/24/23 1716 11/22/23 1715 12/26/23 1739 01/16/24 1727       Program Oxygen Prescription   Program Oxygen Prescription -- None None None      Home Oxygen   Home Oxygen Device --  None None None    Sleep Oxygen Prescription -- CPAP CPAP CPAP    Liters per minute -- 0 0 0    Home Exercise Oxygen Prescription -- None None None    Home Resting Oxygen Prescription -- None None None    Compliance with Home Oxygen Use -- Yes Yes Yes      Goals/Expected Outcomes   Short Term Goals To learn and demonstrate proper use of respiratory medications;To learn and demonstrate proper pursed lip breathing techniques or other breathing techniques.  Other -- To learn and understand importance of maintaining oxygen saturations>88%;To learn and understand importance of monitoring SPO2 with pulse oximeter and demonstrate accurate use of the pulse oximeter.    Long  Term Goals Demonstrates proper use of MDI's;Compliance with respiratory medication;Exhibits proper breathing techniques, such as pursed lip breathing or other method taught during program session Other -- Maintenance of O2 saturations>88%;Verbalizes importance of monitoring SPO2 with pulse oximeter and return demonstration    Comments Reviewed PLB technique with pt.  Talked about how it works and it's importance in maintaining their exercise saturations. Adasyn is having a sleep study tonight. She should find out the results in the next week or so. She will informed staff of any changes to her sleep study. She is wearing her CPAP now and this a sleep study to get an update on her sleep apnea. Korbyn did have her sleep study and no new results were round. No changes were made to her CPAP, and she continues  to use it with no problems. She has a pulse oximeter to check her oxygen saturation at home. Informed and explained why it is important to have one. Reviewed that oxygen saturations should be 88 percent and above. Patient verbalizes understanding.    Goals/Expected Outcomes Short: Become more profiecient at using PLB.   Long: Become independent at using PLB. Short: do sleep study. Long: wear CPAP/BiPAP routinely. Short: continue to wear CPAP. Long: monitor for breathing changes and inform doctor of any concerns. Short: monitor oxygen at home with exertion. Long: maintain oxygen saturations above 88 percent independently.       Oxygen Discharge (Final Oxygen Re-Evaluation):  Oxygen Re-Evaluation - 01/16/24 1727       Program Oxygen Prescription   Program Oxygen Prescription None      Home Oxygen   Home Oxygen Device None    Sleep Oxygen Prescription CPAP    Liters per minute 0    Home Exercise Oxygen Prescription None    Home Resting Oxygen Prescription None    Compliance with Home Oxygen Use Yes      Goals/Expected Outcomes   Short Term Goals To learn and understand importance of maintaining oxygen saturations>88%;To learn and understand importance of monitoring SPO2 with pulse oximeter and demonstrate accurate use of the pulse oximeter.    Long  Term Goals Maintenance of O2 saturations>88%;Verbalizes importance of monitoring SPO2 with pulse oximeter and return demonstration    Comments She has a pulse oximeter to check her oxygen saturation at home. Informed and explained why it is important to have one. Reviewed that oxygen saturations should be 88 percent and above. Patient verbalizes understanding.    Goals/Expected Outcomes Short: monitor oxygen at home with exertion. Long: maintain oxygen saturations above 88 percent independently.          Initial Exercise Prescription:  Initial Exercise Prescription - 10/22/23 1500       Date of Initial Exercise RX and Referring Provider  Date 10/22/23    Referring Provider Dr. Timothy Gollan, MD      Oxygen   Maintain Oxygen Saturation 88% or higher      Treadmill   MPH 1.8    Grade 0.5    Minutes 15    METs 2.5      Recumbant Bike   Level 1    RPM 50    Watts 19    Minutes 15    METs 2.46      NuStep   Level 2    SPM 80    Minutes 15    METs 2.46      Prescription Details   Frequency (times per week) 3    Duration Progress to 30 minutes of continuous aerobic without signs/symptoms of physical distress      Intensity   THRR 40-80% of Max Heartrate 103-148    Ratings of Perceived Exertion 11-13    Perceived Dyspnea 0-4      Progression   Progression Continue to progress workloads to maintain intensity without signs/symptoms of physical distress.      Resistance Training   Training Prescription Yes    Weight 3 lb    Reps 10-15          Perform Capillary Blood Glucose checks as needed.  Exercise Prescription Changes:   Exercise Prescription Changes     Row Name 10/22/23 1500 11/01/23 1100 11/15/23 0800 11/27/23 1500 12/12/23 1300     Response to Exercise   Blood Pressure (Admit) 112/72 122/64 118/60 126/78 112/70   Blood Pressure (Exercise) 124/76 150/68 150/74 140/82 132/78   Blood Pressure (Exit) 106/70 110/70 104/68 126/62 106/58   Heart Rate (Admit) 58 bpm 77 bpm 65 bpm 83 bpm 67 bpm   Heart Rate (Exercise) 119 bpm 108 bpm 107 bpm 115 bpm 123 bpm   Heart Rate (Exit) 78 bpm 88 bpm 71 bpm 92 bpm 70 bpm   Oxygen Saturation (Admit) 94 % 96 % 96 % 95 % 96 %   Oxygen Saturation (Exercise) 93 % 95 % 91 % 95 % 94 %   Oxygen Saturation (Exit) 95 % 95 % 96 % 96 % 96 %   Rating of Perceived Exertion (Exercise) 11 12 15 13 15    Perceived Dyspnea (Exercise) 0 1 1 0 2   Symptoms burning in legs none none none none   Comments Results First two days of exercise -- -- --   Duration -- Continue with 30 min of aerobic exercise without signs/symptoms of physical distress. Continue with 30 min of  aerobic exercise without signs/symptoms of physical distress. Continue with 30 min of aerobic exercise without signs/symptoms of physical distress. Continue with 30 min of aerobic exercise without signs/symptoms of physical distress.   Intensity -- THRR unchanged THRR unchanged THRR unchanged THRR unchanged     Progression   Progression -- Continue to progress workloads to maintain intensity without signs/symptoms of physical distress. Continue to progress workloads to maintain intensity without signs/symptoms of physical distress. Continue to progress workloads to maintain intensity without signs/symptoms of physical distress. Continue to progress workloads to maintain intensity without signs/symptoms of physical distress.   Average METs -- 2.03 2.3 2.9 2.64     Resistance Training   Training Prescription -- Yes Yes Yes Yes   Weight -- 3 lb 3 lb 3 lb 3 lb   Reps -- 10-15 10-15 10-15 10-15     Interval Training   Interval Training -- No No  No No     Treadmill   MPH -- 1.3 1.5 1.5 1.6   Grade -- 0.5 3 5 5    Minutes -- 15 15 15 15    METs -- 2.08 2.77 3.18 3.33     Recumbant Bike   Level -- -- 1.5 -- 1.5   Watts -- -- 19 -- 19   Minutes -- -- 15 -- 15   METs -- -- 2.45 -- 2.44     NuStep   Level -- 3 3 3 4    Minutes -- 15 15 15 15    METs -- 2 2.4 2.3 2.5     REL-XR   Level -- -- -- 2 2   Minutes -- -- -- 15 15   METs -- -- -- -- 1.6     T5 Nustep   Level -- -- 2 -- 2   Minutes -- -- 15 -- 15   METs -- -- 2 -- 2     Oxygen   Maintain Oxygen Saturation -- 88% or higher 88% or higher 88% or higher 88% or higher    Row Name 12/12/23 1700 12/27/23 1800 01/10/24 1000 01/23/24 1600       Response to Exercise   Blood Pressure (Admit) -- 112/62 102/62 104/60    Blood Pressure (Exercise) -- 140/80 -- 128/64    Blood Pressure (Exit) -- 108/68 104/62 96/62    Heart Rate (Admit) -- 83 bpm 80 bpm 74 bpm    Heart Rate (Exercise) -- 104 bpm 108 bpm 107 bpm    Heart Rate (Exit) --  73 bpm 90 bpm 78 bpm    Oxygen Saturation (Admit) -- 98 % 96 % 95 %    Oxygen Saturation (Exercise) -- 94 % 95 % 93 %    Oxygen Saturation (Exit) -- 95 % 96 % 95 %    Rating of Perceived Exertion (Exercise) -- 12 12 14     Perceived Dyspnea (Exercise) -- 1 1 1     Symptoms -- none none none    Duration Continue with 30 min of aerobic exercise without signs/symptoms of physical distress. Continue with 30 min of aerobic exercise without signs/symptoms of physical distress. Continue with 30 min of aerobic exercise without signs/symptoms of physical distress. Continue with 30 min of aerobic exercise without signs/symptoms of physical distress.    Intensity THRR unchanged THRR unchanged THRR unchanged THRR unchanged      Progression   Progression Continue to progress workloads to maintain intensity without signs/symptoms of physical distress. Continue to progress workloads to maintain intensity without signs/symptoms of physical distress. Continue to progress workloads to maintain intensity without signs/symptoms of physical distress. Continue to progress workloads to maintain intensity without signs/symptoms of physical distress.    Average METs 2.64 3.39 2.65 2.67      Resistance Training   Training Prescription Yes Yes Yes Yes    Weight 3 lb 3 lb 3 lb 3 lb    Reps 10-15 10-15 10-15 10-15      Interval Training   Interval Training No No No No      Treadmill   MPH 1.6 4.5 1.6 1.4    Grade 5 1.5 5 4     Minutes 15 15 15 15     METs 3.33 5.38 3.33 2.84      Recumbant Bike   Level 1.5 1.7 -- 1.7    Watts 19 19 -- 20    Minutes 15 15 -- 15    METs 2.44  2.45 -- --      NuStep   Level 4 5 4 4     Minutes 15 15 15 15     METs 2.5 2.9 2 2.9      REL-XR   Level 2 1 -- 2    Minutes 15 15 -- 15    METs 1.6 3.9 -- 2.6      T5 Nustep   Level 2 -- -- 2    Minutes 15 -- -- 15    METs 2 -- -- 2      Biostep-RELP   Level -- -- 2 --    SPM -- -- 50 --    Minutes -- -- 15 --    METs -- -- 2  --      Home Exercise Plan   Plans to continue exercise at Home (comment)  walking Home (comment)  walking Home (comment)  walking Home (comment)  walking    Frequency Add 2 additional days to program exercise sessions. Add 2 additional days to program exercise sessions. Add 2 additional days to program exercise sessions. Add 2 additional days to program exercise sessions.    Initial Home Exercises Provided 12/12/23 12/12/23 12/12/23 12/12/23      Oxygen   Maintain Oxygen Saturation 88% or higher 88% or higher 88% or higher 88% or higher       Exercise Comments:   Exercise Comments     Row Name 10/24/23 1715           Exercise Comments First full day of exercise!  Patient was oriented to gym and equipment including functions, settings, policies, and procedures.  Patient's individual exercise prescription and treatment plan were reviewed.  All starting workloads were established based on the results of the 6 minute walk test done at initial orientation visit.  The plan for exercise progression was also introduced and progression will be customized based on patient's performance and goals.          Exercise Goals and Review:   Exercise Goals     Row Name 10/22/23 1537             Exercise Goals   Increase Physical Activity Yes       Intervention Provide advice, education, support and counseling about physical activity/exercise needs.;Develop an individualized exercise prescription for aerobic and resistive training based on initial evaluation findings, risk stratification, comorbidities and participant's personal goals.       Expected Outcomes Short Term: Attend rehab on a regular basis to increase amount of physical activity.;Long Term: Exercising regularly at least 3-5 days a week.;Long Term: Add in home exercise to make exercise part of routine and to increase amount of physical activity.       Increase Strength and Stamina Yes       Intervention Provide advice, education,  support and counseling about physical activity/exercise needs.;Develop an individualized exercise prescription for aerobic and resistive training based on initial evaluation findings, risk stratification, comorbidities and participant's personal goals.       Expected Outcomes Short Term: Increase workloads from initial exercise prescription for resistance, speed, and METs.;Short Term: Perform resistance training exercises routinely during rehab and add in resistance training at home;Long Term: Improve cardiorespiratory fitness, muscular endurance and strength as measured by increased METs and functional capacity ( )       Able to understand and use rate of perceived exertion (RPE) scale Yes       Intervention Provide education and explanation on how to use  RPE scale       Expected Outcomes Short Term: Able to use RPE daily in rehab to express subjective intensity level;Long Term:  Able to use RPE to guide intensity level when exercising independently       Able to understand and use Dyspnea scale Yes       Intervention Provide education and explanation on how to use Dyspnea scale       Expected Outcomes Short Term: Able to use Dyspnea scale daily in rehab to express subjective sense of shortness of breath during exertion;Long Term: Able to use Dyspnea scale to guide intensity level when exercising independently       Knowledge and understanding of Target Heart Rate Range (THRR) Yes       Intervention Provide education and explanation of THRR including how the numbers were predicted and where they are located for reference       Expected Outcomes Long Term: Able to use THRR to govern intensity when exercising independently;Short Term: Able to state/look up THRR;Short Term: Able to use daily as guideline for intensity in rehab       Able to check pulse independently Yes       Intervention Provide education and demonstration on how to check pulse in carotid and radial arteries.;Review the importance of  being able to check your own pulse for safety during independent exercise       Expected Outcomes Short Term: Able to explain why pulse checking is important during independent exercise;Long Term: Able to check pulse independently and accurately       Understanding of Exercise Prescription Yes       Intervention Provide education, explanation, and written materials on patient's individual exercise prescription       Expected Outcomes Short Term: Able to explain program exercise prescription;Long Term: Able to explain home exercise prescription to exercise independently          Exercise Goals Re-Evaluation :  Exercise Goals Re-Evaluation     Row Name 10/24/23 1715 11/01/23 1128 11/15/23 0848 11/27/23 1524 12/12/23 1339     Exercise Goal Re-Evaluation   Exercise Goals Review Increase Physical Activity;Able to understand and use rate of perceived exertion (RPE) scale;Knowledge and understanding of Target Heart Rate Range (THRR);Understanding of Exercise Prescription;Increase Strength and Stamina;Able to understand and use Dyspnea scale;Able to check pulse independently Increase Physical Activity;Increase Strength and Stamina;Understanding of Exercise Prescription Increase Physical Activity;Increase Strength and Stamina;Understanding of Exercise Prescription Increase Physical Activity;Increase Strength and Stamina;Understanding of Exercise Prescription Increase Physical Activity;Increase Strength and Stamina;Understanding of Exercise Prescription   Comments Reviewed RPE and dyspnea scale, THR and program prescription with pt today.  Pt voiced understanding and was given a copy of goals to take home. Versia is off to a good start in the program. She did well on the treadmill at a speed of 1.3 mph with an incline of 0.5% during her first two sessions of rehab. She also did well on the T4 nustep at level 3. We will continue to monitor her progress in the program. Jemiah is doing well in rehab. She was  recently able to increase her incline on the treadmill from 0.5% to 3% while maintaining a speed of 1.44mph. She was also able to begin using the T5 nustep at level 3. We will continue to monitor her progress in the program. Jacilyn continues to do well in rehab. She was recently able to increase her incline on the treadmill from 3% to 5% while maintaining a speed  of 1.5 mph. She also began using the XR at level 2 and continues to work at level 3 on the T4 nustep. We will continue to monitor her progress in the program. Vesper is doing well in rehab. She was able to increase her speed on the treadmill from 1.5 to 1. while maintaining 5% incline. She was also able to increase her level on the T4 nustep from 3 to 4. We will continue to monitor her progress in the program.   Expected Outcomes Short: Use RPE daily to regulate intensity.  Long: Follow program prescription in THR. Short: Continue to follow current exercise prescription. Long: Continue exercise to improve strength and stamina. Short: Continue to increase treadmill workload. Long: Continue exercise to improve strength and stamina. Short: Begin to increase speed on the treadmill. Long: Continue exercise to improve strength and stamina. Short: Continue to increase speed on the treadmill. Long: Continue exercise to improve strength and stamina.    Row Name 12/12/23 1728 12/27/23 1804 01/10/24 1043 01/23/24 1606       Exercise Goal Re-Evaluation   Exercise Goals Review Increase Physical Activity;Increase Strength and Stamina;Able to understand and use rate of perceived exertion (RPE) scale;Able to understand and use Dyspnea scale;Knowledge and understanding of Target Heart Rate Range (THRR);Able to check pulse independently;Understanding of Exercise Prescription Increase Physical Activity;Understanding of Exercise Prescription;Increase Strength and Stamina Increase Physical Activity;Understanding of Exercise Prescription;Increase Strength and Stamina  Increase Physical Activity;Understanding of Exercise Prescription;Increase Strength and Stamina    Comments Reviewed home exercise with pt today from 5:20 pm to 5:30 pm.  Pt plans to walk for exercise.  Reviewed THR, pulse, RPE, sign and symptoms, pulse oximetery and when to call 911 or MD.  Also discussed weather considerations and indoor options.  Pt voiced understanding. Roslynn continues to do well in rehab. She increased her workload on the treadmill to a speed of 4.5 mph and incline of 1.5%. She also was able to increase to level 5 on the T4 nustep and level 1.7 on the recumbent bike. We will continue to monitor her progress in the program. Vega continues to do well in rehab. She added the biostep at level 2 to her exercise prescription. She was able to increase her incline on the treadmill to 5% with a speed of 1.6 mph. She dropped down to level 4 on the T4 nustep from level 5. We will continue to monitor her progress in the program. Jaclyn continues to do well in rehab and will graduate soon. She completed her post and improved by 43.6% with 1391ft. She maintained level 4 on the T4 nustep, level 2 on the T5 nustep, and level 2 on the XR. We will continue to monitor her progress in the program.    Expected Outcomes Short: add 1-2 days a week of exercise at home on off days of pulmonary rehab. Long: maintain independent exercise routine. Short: Continue to progressively increase treadmill and nustep workloads. Long: Continue exercise to improve strength and stamina. Short: Get back to level 5 on the T4 nustep. Long: Continue exercise to improve strength and stamina. Short: Graduate. Long: Continue to exercise independently.       Discharge Exercise Prescription (Final Exercise Prescription Changes):  Exercise Prescription Changes - 01/23/24 1600       Response to Exercise   Blood Pressure (Admit) 104/60    Blood Pressure (Exercise) 128/64    Blood Pressure (Exit) 96/62    Heart Rate  (Admit) 74 bpm  Heart Rate (Exercise) 107 bpm    Heart Rate (Exit) 78 bpm    Oxygen Saturation (Admit) 95 %    Oxygen Saturation (Exercise) 93 %    Oxygen Saturation (Exit) 95 %    Rating of Perceived Exertion (Exercise) 14    Perceived Dyspnea (Exercise) 1    Symptoms none    Duration Continue with 30 min of aerobic exercise without signs/symptoms of physical distress.    Intensity THRR unchanged      Progression   Progression Continue to progress workloads to maintain intensity without signs/symptoms of physical distress.    Average METs 2.67      Resistance Training   Training Prescription Yes    Weight 3 lb    Reps 10-15      Interval Training   Interval Training No      Treadmill   MPH 1.4    Grade 4    Minutes 15    METs 2.84      Recumbant Bike   Level 1.7    Watts 20    Minutes 15      NuStep   Level 4    Minutes 15    METs 2.9      REL-XR   Level 2    Minutes 15    METs 2.6      T5 Nustep   Level 2    Minutes 15    METs 2      Home Exercise Plan   Plans to continue exercise at Home (comment)   walking   Frequency Add 2 additional days to program exercise sessions.    Initial Home Exercises Provided 12/12/23      Oxygen   Maintain Oxygen Saturation 88% or higher          Nutrition:  Target Goals: Understanding of nutrition guidelines, daily intake of sodium 1500mg , cholesterol 200mg , calories 30% from fat and 7% or less from saturated fats, daily to have 5 or more servings of fruits and vegetables.  Education: All About Nutrition: -Group instruction provided by verbal, written material, interactive activities, discussions, models, and posters to present general guidelines for heart healthy nutrition including fat, fiber, MyPlate, the role of sodium in heart healthy nutrition, utilization of the nutrition label, and utilization of this knowledge for meal planning. Follow up email sent as well. Written material given at  graduation. Flowsheet Row Pulmonary Rehab from 01/23/2024 in [facility] Cardiac and Pulmonary Rehab  Education need identified 10/22/23  Date 01/09/24  [part 2]  Educator JG  Instruction Review Code 1- Verbalizes Understanding    Biometrics:  Pre Biometrics - 10/22/23 1537       Pre Biometrics   Height 5' 6.5 (1.689 m)    Weight 290 lb 14.4 oz (132 kg)    Waist Circumference 50.5 inches    Hip Circumference 55 inches    Waist to Hip Ratio 0.92 %    BMI (Calculated) 46.25    Single Leg Stand 3.45 seconds          Post Biometrics - 01/17/24 1745        Post  Biometrics   Height 5' 6.5 (1.689 m)    Weight 299 lb (135.6 kg)    BMI (Calculated) 47.54    Single Leg Stand 2.85 seconds          Nutrition Therapy Plan and Nutrition Goals:  Nutrition Therapy & Goals - 10/22/23 1449       Nutrition Therapy  Protein (specify units) 70-90    Fiber 25 grams    Whole Grain Foods 3 servings    Saturated Fats 15 max. grams    Fruits and Vegetables 5 servings/day    Sodium 2 grams      Personal Nutrition Goals   Nutrition Goal Eat 15-30gProtein and 30-60gCarbs at each meal.    Personal Goal #2 Read labels and reduce sodium intake to below 2300mg . Ideally 1500mg  per day.    Personal Goal #3 Include more colorful produce, aim for 5-8 servings of fruits and veggies per day    Comments Patient drinking less sugary beverages. Has been trying to better control her DM. Discussed some of her meals. Breakfast being oatmeal with cranberries and raisin with a fruit smoothie. Together walked through her breakfast highlighting  her high carb intake and lack of protein. Provided handout on pairing carbs with protein. Educated on carb sources and set goal of eating ~30-60g per meal. Reviewed mediterranean diet handout and educated on types of fats sources and how to read facts labels. She would like to meal prep more and include more veggies at meals.      Intervention Plan   Intervention  Nutrition handout(s) given to patient.;Prescribe, educate and counsel regarding individualized specific dietary modifications aiming towards targeted core components such as weight, hypertension, lipid management, diabetes, heart failure and other comorbidities.    Expected Outcomes Long Term Goal: Adherence to prescribed nutrition plan.;Short Term Goal: A plan has been developed with personal nutrition goals set during dietitian appointment.;Short Term Goal: Understand basic principles of dietary content, such as calories, fat, sodium, cholesterol and nutrients.          Nutrition Assessments:  MEDIFICTS Score Key: >=70 Need to make dietary changes  40-70 Heart Healthy Diet <= 40 Therapeutic Level Cholesterol Diet  Flowsheet Row Pulmonary Rehab from 01/21/2024 in [facility] Cardiac and Pulmonary Rehab  Picture Your Plate Total Score on Discharge 65   Picture Your Plate Scores: <59 Unhealthy dietary pattern with much room for improvement. 41-50 Dietary pattern unlikely to meet recommendations for good health and room for improvement. 51-60 More healthful dietary pattern, with some room for improvement.  >60 Healthy dietary pattern, although there may be some specific behaviors that could be improved.   Nutrition Goals Re-Evaluation:  Nutrition Goals Re-Evaluation     Row Name 11/22/23 1719 12/26/23 1735 01/16/24 1734         Goals   Current Weight -- -- 295 lb (133.8 kg)     Nutrition Goal -- -- Work on eating healthier snacks     Comment Patient was informed on why it is important to maintain a balanced diet when dealing with Respiratory issues. Explained that it takes a lot of energy to breath and when they are short of breath often they will need to have a good diet to help keep up with the calories they are expending for breathing. Patient reports that she is trying to read food labels and avoid sodium. She does not add salt to anything and makes an effort to low sodium foods. Aigner  is working on diet and losing weight. She makes some good choices but some other times she eats chips and sweets. Informed her to check how many calories she is intaking for a few days.     Expected Outcome Short: Choose and plan snacks accordingly to patients caloric intake to improve breathing. Long: Maintain a diet independently that meets their caloric intake to aid in  daily shortness of breath. Short: continue to get comfortable with reading and understanding food labels. Long: maintain heart healthy diet. Short: watch calorie intake. Long: adhere to a diet that pertains to her.        Nutrition Goals Discharge (Final Nutrition Goals Re-Evaluation):  Nutrition Goals Re-Evaluation - 01/16/24 1734       Goals   Current Weight 295 lb (133.8 kg)    Nutrition Goal Work on eating healthier snacks    Comment Ariyona is working on diet and losing weight. She makes some good choices but some other times she eats chips and sweets. Informed her to check how many calories she is intaking for a few days.    Expected Outcome Short: watch calorie intake. Long: adhere to a diet that pertains to her.          Psychosocial: Target Goals: Acknowledge presence or absence of significant depression and/or stress, maximize coping skills, provide positive support system. Participant is able to verbalize types and ability to use techniques and skills needed for reducing stress and depression.   Education: Stress, Anxiety, and Depression - Group verbal and visual presentation to define topics covered.  Reviews how body is impacted by stress, anxiety, and depression.  Also discusses healthy ways to reduce stress and to treat/manage anxiety and depression.  Written material given at graduation. Flowsheet Row Pulmonary Rehab from 01/23/2024 in [facility] Cardiac and Pulmonary Rehab  Date 12/05/23  Educator [facility]  Instruction Review Code 1- [employer] Understanding    Education: Sleep Hygiene -Provides group verbal and  written instruction about how sleep can affect your health.  Define sleep hygiene, discuss sleep cycles and impact of sleep habits. Review good sleep hygiene tips.    Initial Review & Psychosocial Screening:  Initial Psych Review & Screening - 10/11/23 1256       Initial Review   Current issues with Current Stress Concerns    Source of Stress Concerns Chronic Illness    Comments depression comes and goes does not last   seeing a therapist.      Family Dynamics   Good Support System? Yes   family, Mom     Barriers   Psychosocial barriers to participate in program There are no identifiable barriers or psychosocial needs.      Screening Interventions   Interventions To provide support and resources with identified psychosocial needs;Provide feedback about the scores to participant    Expected Outcomes Short Term goal: Utilizing psychosocial counselor, staff and physician to assist with identification of specific Stressors or current issues interfering with healing process. Setting desired goal for each stressor or current issue identified.;Long Term Goal: Stressors or current issues are controlled or eliminated.;Short Term goal: Identification and review with participant of any Quality of Life or Depression concerns found by scoring the questionnaire.;Long Term goal: The participant improves quality of Life and PHQ9 Scores as seen by post scores and/or verbalization of changes          Quality of Life Scores:  Scores of 19 and below usually indicate a poorer quality of life in these areas.  A difference of  2-3 points is a clinically meaningful difference.  A difference of 2-3 points in the total score of the Quality of Life Index has been associated with significant improvement in overall quality of life, self-image, physical symptoms, and general health in studies assessing change in quality of life.  PHQ-9: Review Flowsheet  More data exists      01/21/2024 10/22/2023  03/14/2022  09/16/2021 04/22/2018  Depression screen PHQ 2/9  Decreased Interest 0 1 0 1 0  Down, Depressed, Hopeless 1 1 0 1 0  PHQ - 2 Score 1 2 0 2 0  Altered sleeping 1 0 - 2 -  Tired, decreased energy 2 0 - 1 -  Change in appetite 2 0 - 1 -  Feeling bad or failure about yourself  1 1 - 0 -  Trouble concentrating 1 0 - 1 -  Moving slowly or fidgety/restless 0 0 - 0 -  Suicidal thoughts 0 0 - 0 -  PHQ-9 Score 8 3 - 7 -  Difficult doing work/chores Not difficult at all Not difficult at all - Somewhat difficult -   Interpretation of Total Score  Total Score Depression Severity:  1-4 = Minimal depression, 5-9 = Mild depression, 10-14 = Moderate depression, 15-19 = Moderately severe depression, 20-27 = Severe depression   Psychosocial Evaluation and Intervention:  Psychosocial Evaluation - 10/11/23 1306       Psychosocial Evaluation & Interventions   Interventions Encouraged to exercise with the program and follow exercise prescription    Comments There are no barriers to starting the program.  She has support from her family, mostly her Mom.  She does see a therapist, on no meds at this time.  Recognizes that she has has had depression at times and it does not last long.  She is ready to learn about managing her weight and improving her lifestyle.    Expected Outcomes STG attend all scheduled sessions, meet with RD  for diabetes and weight loss guidance, and follow the guidance for  exercise progression   LTG Able to maintain her exercise progression, continues to work on her weight loss goals and managing her diabetes    Continue Psychosocial Services  Follow up required by staff          Psychosocial Re-Evaluation:  Psychosocial Re-Evaluation     Row Name 11/22/23 1720 12/26/23 1732 01/16/24 1740         Psychosocial Re-Evaluation   Current issues with Current Depression Current Depression Current Depression     Comments Inez meets with her therapist about once a week. She states  that it is helping her alot. Her depression stems from anxiety. Jenaveve states that she has no changes in her sleep, stress, and mental health. She feels stable in this area and continues to meet with her therapist. Patient reports no  new ssues with their current mental states, sleep, stress, depression or anxiety. Will follow up with patient in a few weeks for any changes.     Expected Outcomes Short:continue meeting with therapist. Long: maintain a healthy mental state. Short:Continue to meet with therapist and monitor for any changes in mental state. Long: maintain good mental health habits. Short: Continue to exercise regularly to support mental health and notify staff of any changes. Long: maintain mental health and well being through teaching of rehab or prescribed medications independently.     Interventions Encouraged to attend Pulmonary Rehabilitation for the exercise Encouraged to attend Pulmonary Rehabilitation for the exercise Encouraged to attend Pulmonary Rehabilitation for the exercise     Continue Psychosocial Services  Follow up required by staff Follow up required by staff Follow up required by staff        Psychosocial Discharge (Final Psychosocial Re-Evaluation):  Psychosocial Re-Evaluation - 01/16/24 1740       Psychosocial Re-Evaluation   Current issues with Current Depression  Comments Patient reports no  new ssues with their current mental states, sleep, stress, depression or anxiety. Will follow up with patient in a few weeks for any changes.    Expected Outcomes Short: Continue to exercise regularly to support mental health and notify staff of any changes. Long: maintain mental health and well being through teaching of rehab or prescribed medications independently.    Interventions Encouraged to attend Pulmonary Rehabilitation for the exercise    Continue Psychosocial Services  Follow up required by staff          Education: Education Goals: Education classes will  be provided on a weekly basis, covering required topics. Participant will state understanding/return demonstration of topics presented.  Learning Barriers/Preferences:  Learning Barriers/Preferences - 10/11/23 1259       Learning Barriers/Preferences   Learning Barriers Hearing   deaf in left ear,   Learning Preferences None          General Pulmonary Education Topics:  Infection Prevention: - Provides verbal and written material to individual with discussion of infection control including proper hand washing and proper equipment cleaning during exercise session. Flowsheet Row Pulmonary Rehab from 01/23/2024 in [facility] Cardiac and Pulmonary Rehab  Date 10/22/23  Educator NT  Instruction Review Code 1- Verbalizes Understanding    Falls Prevention: - Provides verbal and written material to individual with discussion of falls prevention and safety. Flowsheet Row Pulmonary Rehab from 01/23/2024 in [facility] Cardiac and Pulmonary Rehab  Date 10/11/23  Educator SB  Instruction Review Code 1- Verbalizes Understanding    Chronic Lung Disease Review: - Group verbal instruction with posters, models, PowerPoint presentations and videos,  to review new updates, new respiratory medications, new advancements in procedures and treatments. Providing information on websites and 800 numbers for continued self-education. Includes information about supplement oxygen, available portable oxygen systems, continuous and intermittent flow rates, oxygen safety, concentrators, and Medicare reimbursement for oxygen. Explanation of Pulmonary Drugs, including class, frequency, complications, importance of spacers, rinsing mouth after steroid MDI's, and proper cleaning methods for nebulizers. Review of basic lung anatomy and physiology related to function, structure, and complications of lung disease. Review of risk factors. Discussion about methods for diagnosing sleep apnea and types of masks and machines for OSA.  Includes a review of the use of types of environmental controls: home humidity, furnaces, filters, dust mite/pet prevention, HEPA vacuums. Discussion about weather changes, air quality and the benefits of nasal washing. Instruction on Warning signs, infection symptoms, calling MD promptly, preventive modes, and value of vaccinations. Review of effective airway clearance, coughing and/or vibration techniques. Emphasizing that all should Create an Action Plan. Written material given at graduation. Flowsheet Row Pulmonary Rehab from 01/23/2024 in [facility] Cardiac and Pulmonary Rehab  Date 11/28/23  Educator [facility]  Instruction Review Code 1- Verbalizes Understanding    AED/CPR: - Group verbal and written instruction with the use of models to demonstrate the basic use of the AED with the basic ABC's of resuscitation.    Anatomy and Cardiac Procedures: - Group verbal and visual presentation and models provide information about basic cardiac anatomy and function. Reviews the testing methods done to diagnose heart disease and the outcomes of the test results. Describes the treatment choices: Medical Management, Angioplasty, or Coronary Bypass Surgery for treating various heart conditions including Myocardial Infarction, Angina, Valve Disease, and Cardiac Arrhythmias.  Written material given at graduation. Flowsheet Row Pulmonary Rehab from 01/23/2024 in [facility] Cardiac and Pulmonary Rehab  Education need identified 10/22/23  Date 11/07/23  Educator SB  Instruction Review Code 1- Verbalizes Understanding    Medication Safety: - Group verbal and visual instruction to review commonly prescribed medications for heart and lung disease. Reviews the medication, class of the drug, and side effects. Includes the steps to properly store meds and maintain the prescription regimen.  Written material given at graduation. Flowsheet Row Pulmonary Rehab from 01/23/2024 in [facility] and Pul[facility] Re[facility]  Date 01/23/24   Educator [facility]  Instruction Review Code 1- Verbalizes Understanding    Other: -Provides group and verbal instruction on various topics (see comments)   Knowledge Questionnaire Score:  Knowledge Questionnaire Score - 01/21/24 1633       Knowledge Questionnaire Score   Post Score 16/18           Core Components/Risk Factors/Patient Goals at Admission:  Personal Goals and Risk Factors at Admission - 10/11/23 1301       Core Components/Risk Factors/Patient Goals on Admission    Weight Management Yes    Intervention Weight Management: Develop a combined nutrition and exercise program designed to reach desired caloric intake, while maintaining appropriate intake of nutrient and fiber, sodium and fats, and appropriate energy expenditure required for the weight goal.    Admit Weight 288 lb (130.6 kg)    Goal Weight: Short Term 286 lb (129.7 kg)    Goal Weight: Long Term 239 lb (108.4 kg)    Expected Outcomes Short Term: Continue to assess and modify interventions until short term weight is achieved;Long Term: Adherence to nutrition and physical activity/exercise program aimed toward attainment of established weight goal;Weight Loss: Understanding of general recommendations for a balanced deficit meal plan, which promotes 1-2 lb weight loss per week and includes a negative energy balance of [phone] kcal/d    Improve shortness of breath with ADL's Yes    Intervention Provide education, individualized exercise plan and daily activity instruction to help decrease symptoms of SOB with activities of daily living.    Expected Outcomes Short Term: Improve cardiorespiratory fitness to achieve a reduction of symptoms when performing ADLs;Long Term: Be able to perform more ADLs without symptoms or delay the onset of symptoms    Increase knowledge of respiratory medications and ability to use respiratory devices properly  Yes    Intervention Provide education and demonstration as needed of appropriate  use of medications, inhalers, and oxygen therapy.    Expected Outcomes Short Term: Achieves understanding of medications use. Understands that oxygen is a medication prescribed by physician. Demonstrates appropriate use of inhaler and oxygen therapy.;Long Term: Maintain appropriate use of medications, inhalers, and oxygen therapy.    Diabetes Yes    Intervention Provide education about signs/symptoms and action to take for hypo/hyperglycemia.;Provide education about proper nutrition, including hydration, and aerobic/resistive exercise prescription along with prescribed medications to achieve blood glucose in normal ranges: Fasting glucose 65-99 mg/dL    Expected Outcomes Short Term: Participant verbalizes understanding of the signs/symptoms and immediate care of hyper/hypoglycemia, proper foot care and importance of medication, aerobic/resistive exercise and nutrition plan for blood glucose control.;Long Term: Attainment of HbA1C < 7%.    Heart Failure Yes    Intervention Provide a combined exercise and nutrition program that is supplemented with education, support and counseling about heart failure. Directed toward relieving symptoms such as shortness of breath, decreased exercise tolerance, and extremity edema.    Expected Outcomes Improve functional capacity of life;Short term: Attendance in program 2-3 days a week with increased exercise capacity. Reported lower sodium intake. Reported increased  fruit and vegetable intake. Reports medication compliance.;Short term: Daily weights obtained and reported for increase. Utilizing diuretic protocols set by physician.;Long term: Adoption of self-care skills and reduction of barriers for early signs and symptoms recognition and intervention leading to self-care maintenance.    Hypertension Yes    Intervention Provide education on lifestyle modifcations including regular physical activity/exercise, weight management, moderate sodium restriction and increased  consumption of fresh fruit, vegetables, and low fat dairy, alcohol moderation, and smoking cessation.;Monitor prescription use compliance.    Expected Outcomes Short Term: Continued assessment and intervention until BP is < 140/75mm HG in hypertensive participants. < 130/66mm HG in hypertensive participants with diabetes, heart failure or chronic kidney disease.;Long Term: Maintenance of blood pressure at goal levels.    Lipids Yes    Intervention Provide education and support for participant on nutrition & aerobic/resistive exercise along with prescribed medications to achieve LDL 70mg , HDL >40mg .    Expected Outcomes Short Term: Participant states understanding of desired cholesterol values and is compliant with medications prescribed. Participant is following exercise prescription and nutrition guidelines.;Long Term: Cholesterol controlled with medications as prescribed, with individualized exercise RX and with personalized nutrition plan. Value goals: LDL < 70mg , HDL > 40 mg.          Education:Diabetes - Individual verbal and written instruction to review signs/symptoms of diabetes, desired ranges of glucose level fasting, after meals and with exercise. Acknowledge that pre and post exercise glucose checks will be done for 3 sessions at entry of program. Flowsheet Row Pulmonary Rehab from 01/23/2024 in [facility] Re[facility]  Date 10/22/23  Educator NT  Instruction Review Code 1- Verbalizes Understanding    Know Your Numbers and Heart Failure: - Group verbal and visual instruction to discuss disease risk factors for cardiac and pulmonary disease and treatment options.  Reviews associated critical values for Overweight/Obesity, Hypertension, Cholesterol, and Diabetes.  Discusses basics of heart failure: signs/symptoms and treatments.  Introduces Heart Failure Zone chart for action plan for heart failure.  Written material given at graduation. Flowsheet Row Pulmonary Rehab from  01/23/2024 in [facility] Cardiac and Pulmonary Rehab  Date 11/21/23  Educator SB  Instruction Review Code 1- Verbalizes Understanding    Core Components/Risk Factors/Patient Goals Review:   Goals and Risk Factor Review     Row Name 11/22/23 1718 12/26/23 1737 01/16/24 1729         Core Components/Risk Factors/Patient Goals Review   Personal Goals Review Improve shortness of breath with ADL's Hypertension;Diabetes Weight Management/Obesity     Review Spoke to patient about their shortness of breath and what they can do to improve. Patient has been informed of breathing techniques when starting the program. Patient is informed to tell staff if they have had any med changes and that certain meds they are taking or not taking can be causing shortness of breath. Patient reports that she owns a BP cuff and glucometer but that she does not regulary check her blood sugar and BP at home. She was encouraged to do so. She does follow up with her doctor regularly to monitor risk factors. Marleigh wants to start out getting down to 280 pounds. Her long term is reaching a goal of 220 pounds. She weighs 295 pounds currently. She is going to wotk on losing a few pounds in the next couple weeks.     Expected Outcomes Short: Attend [facility] regularly to improve shortness of breath with ADL's. Long: maintain independence with ADL's Short: start checking BP and  BG at home consistently. Long: control risk factors. with healthy lifestyle and support from medical team. Short: lose 5 pounds. Long: reach weight goal.        Core Components/Risk Factors/Patient Goals at Discharge (Final Review):   Goals and Risk Factor Review - 01/16/24 1729       Core Components/Risk Factors/Patient Goals Review   Personal Goals Review Weight Management/Obesity    Review Malarie wants to start out getting down to 280 pounds. Her long term is reaching a goal of 220 pounds. She weighs 295 pounds currently. She is going to wotk on losing a  few pounds in the next couple weeks.    Expected Outcomes Short: lose 5 pounds. Long: reach weight goal.          ITP Comments:  ITP Comments     Row Name 10/11/23 1311 10/22/23 1532 10/24/23 1714 11/07/23 1212 12/05/23 1349   ITP Comments Virtual orientation call completed today. shehas an appointment on Date: 10/22/2023  for EP eval and gym Orientation.  Documentation of diagnosis can be found in [facility] 08/28/2023 . Completed and gym orientation. Initial ITP created and sent for review to Dr. Faud Aleskerov, Medical Director. First full day of exercise!  Patient was oriented to gym and equipment including functions, settings, policies, and procedures.  Patient's individual exercise prescription and treatment plan were reviewed.  All starting workloads were established based on the results of the 6 minute walk test done at initial orientation visit.  The plan for exercise progression was also introduced and progression will be customized based on patient's performance and goals. 30 Day review completed. Medical Director ITP review done, changes made as directed, and signed approval by Medical Director.    new to program 30 Day review completed. Medical Director ITP review done, changes made as directed, and signed approval by Medical Director.    Row Name 01/02/24 1027 01/30/24 1226         ITP Comments 30 Day review completed. Medical Director ITP review done, changes made as directed, and signed approval by Medical Director. 30 Day review completed. Medical Director ITP review done, changes made as directed, and signed approval by Medical Director.         Comments: 30 day review

## 2024-01-30 NOTE — Progress Notes (Signed)
 Daily Session Note  Patient Details  Name: Heather Norman MRN: [id number] Date of Birth: 06/01/1976 Referring Provider:   Flowsheet Row Pulmonary Rehab from 10/22/2023 in [facility] Cardiac and Pulmonary Rehab  Referring Provider Dr. Evalene Lunger, MD    Encounter Date: 01/30/2024  Check In:  Session Check In - 01/30/24 1724       Check-In   Supervising physician immediately available to respond to emergencies See telemetry face sheet for immediately available ER MD    Location [facility]-Cardiac & Pulmonary Rehab    Staff Present Burnard Davenport RN,BSN,MPA;Joseph Rolinda RCP,RRT,BSRT;Kristen Coble RN,BC,MSN    Virtual Visit No    Medication changes reported     No    Fall or balance concerns reported    No    Warm-up and Cool-down Performed on first and last piece of equipment    Resistance Training Performed Yes    VAD Patient? No    PAD/SET Patient? No      Pain Assessment   Currently in Pain? No/denies             Social History   Tobacco Use  Smoking Status Never  Smokeless Tobacco Never    Goals Met:  Independence with exercise equipment Exercise tolerated well No report of concerns or symptoms today Strength training completed today  Goals Unmet:  Not Applicable  Comments: Pt able to follow exercise prescription today without complaint.  Will continue to monitor for progression.    Dr. Oneil Pinal is Medical Director for [facility] Card[facility] Rehabilitation.  Dr. Fuad Aleskerov is Medical Director for [facility] Pulmonary Rehabilitation.

## 2024-01-30 NOTE — Patient Instructions (Addendum)
 It was good to see you today!  If you receive a satisfaction survey regarding the [facility] Failure Clinic, please take the time to fill it out. This way we can continue to provide excellent care and make any changes that need to be made.   Medication Changes:  No medication changes today!  Lab Work:  Go over to the [facility]. Go pass the gift shop and have your blood work completed.  We will only call you if the results are abnormal or if the provider would like to make medication changes.   Follow-Up in: Please keep follow up appointment with the [facility].   At the [facility], you and your health needs are our priority. We have a designated team specialized in the treatment of Heart Failure. This Care Team includes your primary Heart Failure Specialized Cardiologist (physician), Advanced Practice Providers (APPs- Physician Assistants and Nurse Practitioners), and Pharmacist who all work together to provide you with the care you need, when you need it.   You may see any of the following providers on your designated Care Team at your next follow up:  Dr. Toribio Fuel Dr. Ezra Shuck Dr. Ria Commander Dr. Odis Brownie Ellouise Class, FNP Jaun Bash, RPH-CPP  Please be sure to bring in all your medications bottles to every appointment.   Need to Contact Us :  If you have any questions or concerns before your next appointment please send us  a message through [place] or call our office at [phone].    TO LEAVE A MESSAGE FOR THE NURSE SELECT OPTION 2, PLEASE LEAVE A MESSAGE INCLUDING: YOUR NAME DATE OF BIRTH CALL BACK NUMBER REASON FOR CALL**this is important as we prioritize the call backs  YOU WILL RECEIVE A CALL BACK THE SAME DAY AS LONG AS YOU CALL BEFORE 4:00 PM

## 2024-01-31 ENCOUNTER — Encounter: Admitting: *Deleted

## 2024-01-31 DIAGNOSIS — I5032 Chronic diastolic (congestive) heart failure: Secondary | ICD-10-CM | POA: Diagnosis not present

## 2024-01-31 NOTE — Progress Notes (Signed)
 Pulmonary Individual Treatment Plan  Patient Details  Name: LISSA ROWLES MRN: [id number] Date of Birth: 05/31/1976 Referring Provider:   Flowsheet Row Pulmonary Rehab from 10/22/2023 in [facility] Cardiac and Pulmonary Rehab  Referring Provider Dr. Evalene Lunger, MD    Initial Encounter Date:  Flowsheet Row Pulmonary Rehab from 10/22/2023 in [facility] Cardiac and Pulmonary Rehab  Date 10/22/23    Visit Diagnosis: Heart failure, diastolic, chronic (HCC)  Patient's Home Medications on Admission:  Current Outpatient Medications:    albuterol  (VENTOLIN  HFA) 108 (90 Base) MCG/ACT inhaler, Inhale 1-2 puffs into the lungs every 6 (six) hours as needed., Disp: 18 g, Rfl: 0   aluminum-magnesium hydroxide 200-200 MG/5ML suspension, Take 15 mLs by mouth every 6 (six) hours as needed for indigestion., Disp: , Rfl:    aspirin  EC 81 MG tablet, Take 81 mg by mouth daily., Disp: , Rfl:    atorvastatin  (LIPITOR) 20 MG tablet, TAKE 1 TABLET(20 MG) BY MOUTH DAILY, Disp: 90 tablet, Rfl: 3   carvedilol  (COREG ) 25 MG tablet, Take 0.5 tablets (12.5 mg total) by mouth 2 (two) times daily with a meal., Disp: 225 tablet, Rfl: 3   cetirizine (ZYRTEC) 10 MG tablet, Take 10 mg by mouth daily., Disp: , Rfl:    Elagolix Sodium  (ORILISSA ) 150 MG TABS, Take 1 tablet (150 mg total) by mouth daily., Disp: 30 tablet, Rfl: 2   empagliflozin  (JARDIANCE ) 25 MG TABS tablet, Take 1 tablet (25 mg total) by mouth daily before breakfast., Disp: 30 tablet, Rfl: 6   famotidine  (PEPCID ) 20 MG tablet, Take 1 tablet (20 mg total) by mouth 2 (two) times daily., Disp: , Rfl:    famotidine -calcium  carbonate-magnesium hydroxide (PEPCID  COMPLETE) 10-800-165 MG chewable tablet, Chew 1 tablet by mouth daily as needed., Disp: , Rfl:    fluticasone  (FLONASE ) 50 MCG/ACT nasal spray, Place 2 sprays into both nostrils daily., Disp: , Rfl:    latanoprost  (XALATAN ) 0.005 % ophthalmic solution, Place 1 drop into both eyes at bedtime., Disp: , Rfl:     meclizine  (ANTIVERT ) 25 MG tablet, Take 1 tablet (25 mg total) by mouth 3 (three) times daily as needed for dizziness or nausea., Disp: 30 tablet, Rfl: 1   metolazone  (ZAROXOLYN ) 2.5 MG tablet, Take 1 tablet (2.5 mg total) by mouth once a week., Disp: , Rfl:    mexiletine (MEXITIL ) 150 MG capsule, Take 2 capsules (300 mg total) by mouth 2 (two) times daily., Disp: 360 capsule, Rfl: 3   pantoprazole  (PROTONIX ) 40 MG tablet, Take 1 tablet (40 mg total) by mouth daily., Disp: 90 tablet, Rfl: 3   potassium chloride  20 MEQ/15ML (10%) SOLN, Take 60 mLs (80 mEq total) by mouth daily. And additional 60meq ( ) on the days you take the metolazone , Disp: 473 mL, Rfl: 6   sacubitril -valsartan  (ENTRESTO ) 24-26 MG, Take 1 tablet by mouth 2 (two) times daily., Disp: , Rfl:    Semaglutide ,0.25 or 0.5MG /DOS, (OZEMPIC , 0.25 OR 0.5 MG/DOSE,) 2 MG/3ML SOPN, Inject 0.5 mg into the skin once a week., Disp: 3 mL, Rfl: 1   spironolactone  (ALDACTONE ) 25 MG tablet, Take 2 tablets (50 mg total) by mouth daily., Disp: , Rfl:    torsemide  (DEMADEX ) 20 MG tablet, Take 4 tablets (80 mg total) by mouth every morning AND 3 tablets (60 mg total) every evening., Disp: 180 tablet, Rfl: 6  Past Medical History: Past Medical History:  Diagnosis Date   (HFpEF) heart failure with preserved ejection fraction (HCC)    a. 04/2017 Echo: EF  55-60%, no rwma, Mild MR, mildly dil LA. Nl RV fxn; b. 06/2019 Echo: EF 60-65%, mod LVH. Gr2 DD. Mildly dil LA; c. 03/2023 Cath: Nl cors. mild PAH (PA 31/17 (26) w/ high output (CO/CI 8.4/3.5); d. 08/2023 Echo: EF 55-60%, no rwma, GrI DD, mildly reduced RV fxn, RVSP 21.6 mmHg. No significant valvular dzs.   Arthritis    kness/hands - no meds   Diabetes mellitus without complication (HCC)    DM (diabetes mellitus) (HCC) 05/11/2017   Dyspnea on exertion    Headache(784.0)    otc med prn   Heart murmur    History of cardiac cath    a. 03/2023 Cath: Nl cors.   Hyperlipidemia    no meds since pregnancy    Hypertension    Morbid obesity (HCC)    NSVT (nonsustained ventricular tachycardia) (HCC)    a. 07/2022 Zio: 44 runs NSVT, longest 8 beats, fastest 182. 3 brief SVT runs.  21.3% PVC burden.   PVC (premature ventricular contraction)    a. 07/2022 [facility]io: 21.3 PVCs, 9.6% couplets, 3.5% triplets, 44 brief runs of NSVT.    Tobacco Use: Social History   Tobacco Use  Smoking Status Never  Smokeless Tobacco Never    Labs: Review Flowsheet  More data may exist      Latest Ref Rng & Units 12/13/2012 01/14/2015 06/05/2020 03/28/2023 08/22/2023  Labs for ITP Cardiac and Pulmonary Rehab  Cholestrol 0 - 200 mg/dL 800  831  780  - -  LDL (calc) 0 - 99 mg/dL 869  893  855  - -  HDL-C >40 mg/dL 52  42  48  - -  Trlycerides <150 mg/dL 84  897  866  - -  Hemoglobin A1c 4.8 - 5.6 % 6.6  - - - 8.5   PH, Arterial 7.35 - 7.45 - - - 7.372  -  PCO2 arterial 32 - 48 mmHg - - - 38.3  -  Bicarbonate 20.0 - 28.0 mmol/L - - - 18.9  24.8  22.2  -  TCO2 22 - 32 mmol/L - - - 20  26  23   -  Acid-base deficit 0.0 - 2.0 mmol/L - - - 7.0  1.0  3.0  -  O2 Saturation % - - - 71  73  96  -    Details       Multiple values from one day are sorted in reverse-chronological order          Pulmonary Assessment Scores:  Pulmonary Assessment Scores     Row Name 10/22/23 1532 01/17/24 1738 01/21/24 1635     ADL UCSD   ADL Phase Entry -- Exit   SOB Score total 20 -- 18   Rest 0 -- 0   Walk 0 -- 2   Stairs 4 -- 3   Bath 0 -- 0   Dress 0 -- 0   Shop 0 -- 0     CAT Score   CAT Score 20 -- 13     mMRC Score   mMRC Score 2 1 --      UCSD: Self-administered rating of dyspnea associated with activities of daily living (ADLs) 6-point scale (0 = not at all to 5 = maximal or unable to do because of breathlessness)  Scoring Scores range from 0 to 120.  Minimally important difference is 5 units  CAT: CAT can identify the health impairment of COPD patients and is better correlated with disease progression.   CAT  has a scoring range of zero to 40. The CAT score is classified into four groups of low (less than 10), medium (10 - 20), high (21-30) and very high (31-40) based on the impact level of disease on health status. A CAT score over 10 suggests significant symptoms.  A worsening CAT score could be explained by an exacerbation, poor medication adherence, poor inhaler technique, or progression of COPD or comorbid conditions.  CAT MCID is 2 points  mMRC: mMRC (Modified Medical Research Council) Dyspnea Scale is used to assess the degree of baseline functional disability in patients of respiratory disease due to dyspnea. No minimal important difference is established. A decrease in score of 1 point or greater is considered a positive change.   Pulmonary Function Assessment:   Exercise Target Goals: Exercise Program Goal: Individual exercise prescription set using results from initial 6 min walk test and THRR while considering  patient's activity barriers and safety.   Exercise Prescription Goal: Initial exercise prescription builds to 30-45 minutes a day of aerobic activity, 2-3 days per week.  Home exercise guidelines will be given to patient during program as part of exercise prescription that the participant will acknowledge.  Education: Aerobic Exercise: - Group verbal and visual presentation on the components of exercise prescription. Introduces F.I.T.T principle from ACSM for exercise prescriptions.  Reviews F.I.T.T. principles of aerobic exercise including progression. Written material given at graduation. Flowsheet Row Pulmonary Rehab from 01/23/2024 in [facility] Cardiac and Pulmonary Rehab  Education need identified 10/22/23  Date 12/26/23  Educator [facility]  Instruction Review Code 1- [employer] Understanding    Education: Resistance Exercise: - Group verbal and visual presentation on the components of exercise prescription. Introduces F.I.T.T principle from ACSM for exercise prescriptions   Reviews F.I.T.T. principles of resistance exercise including progression. Written material given at graduation.    Education: Exercise & Equipment Safety: - Individual verbal instruction and demonstration of equipment use and safety with use of the equipment. Flowsheet Row Pulmonary Rehab from 01/23/2024 in [facility] Cardiac and Pulmonary Rehab  Date 10/22/23  Educator NT  Instruction Review Code 1- Verbalizes Understanding    Education: Exercise Physiology & General Exercise Guidelines: - Group verbal and written instruction with models to review the exercise physiology of the cardiovascular system and associated critical values. Provides general exercise guidelines with specific guidelines to those with heart or lung disease.  Flowsheet Row Pulmonary Rehab from 01/23/2024 in [facility] Cardiac and Pulmonary Rehab  Date 12/12/23  Educator [facility]  Instruction Review Code 1- [employer] Understanding    Education: Flexibility, Balance, Mind/Body Relaxation: - Group verbal and visual presentation with interactive activity on the components of exercise prescription. Introduces F.I.T.T principle from ACSM for exercise prescriptions. Reviews F.I.T.T. principles of flexibility and balance exercise training including progression. Also discusses the mind body connection.  Reviews various relaxation techniques to help reduce and manage stress (i.e. Deep breathing, progressive muscle relaxation, and visualization). Balance handout provided to take home. Written material given at graduation.   Activity Barriers & Risk Stratification:  Activity Barriers & Cardiac Risk Stratification - 10/22/23 1536       Activity Barriers & Cardiac Risk Stratification   Activity Barriers Shortness of Breath;Balance Concerns   Vertigo         6 Minute Walk:  6 Minute Walk     Row Name 10/22/23 1534 01/17/24 1738       6 Minute Walk   Phase Initial Discharge    Distance 940 feet 1350 feet  Distance % Change -- 410 %     Distance Feet Change -- 410 ft    Walk Time 6 minutes 6 minutes    # of Rest Breaks 0 0    MPH 1.78 2.56    METS 2.46 2.99    RPE 11 12    Perceived Dyspnea  0 0    VO2 Peak 8.6 10.47    Symptoms Yes (comment) No    Comments burning in legs --    Resting HR 58 bpm 74 bpm    Resting BP 112/72 104/60    Resting Oxygen Saturation  94 % 95 %    Exercise Oxygen Saturation  during 6 min walk 93 % 90 %    Max Ex. HR 119 bpm 104 bpm    Max Ex. BP 124/76 128/64    2 Minute Post BP 106/70 112/60      Interval HR   1 Minute HR 111 100    2 Minute HR 101 101    3 Minute HR 108 100    4 Minute HR 84 102    5 Minute HR 108 100    6 Minute HR 119 104    2 Minute Post HR 78 68    Interval Heart Rate? Yes Yes      Interval Oxygen   Interval Oxygen? Yes Yes    Baseline Oxygen Saturation % 94 % 95 %    1 Minute Oxygen Saturation % 93 % 94 %    1 Minute Liters of Oxygen 0 L  RA 0 L    2 Minute Oxygen Saturation % 94 % 95 %    2 Minute Liters of Oxygen 0 L 0 L    3 Minute Oxygen Saturation % 93 % 90 %    3 Minute Liters of Oxygen 0 L 0 L    4 Minute Oxygen Saturation % 95 % 91 %    4 Minute Liters of Oxygen 0 L 0 L    5 Minute Oxygen Saturation % 94 % 91 %    5 Minute Liters of Oxygen 0 L 0 L    6 Minute Oxygen Saturation % 94 % 95 %    6 Minute Liters of Oxygen 0 L 0 L    2 Minute Post Oxygen Saturation % 95 % 95 %    2 Minute Post Liters of Oxygen 0 L 0 L      Oxygen Initial Assessment:  Oxygen Initial Assessment - 10/11/23 1251       Home Oxygen   Home Oxygen Device None    Sleep Oxygen Prescription None    Home Exercise Oxygen Prescription None    Home Resting Oxygen Prescription None    Compliance with Home Oxygen Use Yes      Intervention   Short Term Goals To learn and demonstrate proper use of respiratory medications;To learn and demonstrate proper pursed lip breathing techniques or other breathing techniques.     Long  Term Goals Demonstrates proper use of  MDI's;Compliance with respiratory medication;Exhibits proper breathing techniques, such as pursed lip breathing or other method taught during program session          Oxygen Re-Evaluation:  Oxygen Re-Evaluation     Row Name 10/24/23 1716 11/22/23 1715 12/26/23 1739 01/16/24 1727       Program Oxygen Prescription   Program Oxygen Prescription -- None None None      Home Oxygen   Home Oxygen  Device -- None None None    Sleep Oxygen Prescription -- CPAP CPAP CPAP    Liters per minute -- 0 0 0    Home Exercise Oxygen Prescription -- None None None    Home Resting Oxygen Prescription -- None None None    Compliance with Home Oxygen Use -- Yes Yes Yes      Goals/Expected Outcomes   Short Term Goals To learn and demonstrate proper use of respiratory medications;To learn and demonstrate proper pursed lip breathing techniques or other breathing techniques.  Other -- To learn and understand importance of maintaining oxygen saturations>88%;To learn and understand importance of monitoring SPO2 with pulse oximeter and demonstrate accurate use of the pulse oximeter.    Long  Term Goals Demonstrates proper use of MDI's;Compliance with respiratory medication;Exhibits proper breathing techniques, such as pursed lip breathing or other method taught during program session Other -- Maintenance of O2 saturations>88%;Verbalizes importance of monitoring SPO2 with pulse oximeter and return demonstration    Comments Reviewed PLB technique with pt.  Talked about how it works and it's importance in maintaining their exercise saturations. Loraine is having a sleep study tonight. She should find out the results in the next week or so. She will informed staff of any changes to her sleep study. She is wearing her CPAP now and this a sleep study to get an update on her sleep apnea. Shiane did have her sleep study and no new results were round. No changes were made to her CPAP, and she continues to use it with no  problems. She has a pulse oximeter to check her oxygen saturation at home. Informed and explained why it is important to have one. Reviewed that oxygen saturations should be 88 percent and above. Patient verbalizes understanding.    Goals/Expected Outcomes Short: Become more profiecient at using PLB.   Long: Become independent at using PLB. Short: do sleep study. Long: wear CPAP/BiPAP routinely. Short: continue to wear CPAP. Long: monitor for breathing changes and inform doctor of any concerns. Short: monitor oxygen at home with exertion. Long: maintain oxygen saturations above 88 percent independently.       Oxygen Discharge (Final Oxygen Re-Evaluation):  Oxygen Re-Evaluation - 01/16/24 1727       Program Oxygen Prescription   Program Oxygen Prescription None      Home Oxygen   Home Oxygen Device None    Sleep Oxygen Prescription CPAP    Liters per minute 0    Home Exercise Oxygen Prescription None    Home Resting Oxygen Prescription None    Compliance with Home Oxygen Use Yes      Goals/Expected Outcomes   Short Term Goals To learn and understand importance of maintaining oxygen saturations>88%;To learn and understand importance of monitoring SPO2 with pulse oximeter and demonstrate accurate use of the pulse oximeter.    Long  Term Goals Maintenance of O2 saturations>88%;Verbalizes importance of monitoring SPO2 with pulse oximeter and return demonstration    Comments She has a pulse oximeter to check her oxygen saturation at home. Informed and explained why it is important to have one. Reviewed that oxygen saturations should be 88 percent and above. Patient verbalizes understanding.    Goals/Expected Outcomes Short: monitor oxygen at home with exertion. Long: maintain oxygen saturations above 88 percent independently.          Initial Exercise Prescription:  Initial Exercise Prescription - 10/22/23 1500       Date of Initial Exercise RX and Referring Provider  Date 10/22/23     Referring Provider Dr. Timothy Gollan, MD      Oxygen   Maintain Oxygen Saturation 88% or higher      Treadmill   MPH 1.8    Grade 0.5    Minutes 15    METs 2.5      Recumbant Bike   Level 1    RPM 50    Watts 19    Minutes 15    METs 2.46      NuStep   Level 2    SPM 80    Minutes 15    METs 2.46      Prescription Details   Frequency (times per week) 3    Duration Progress to 30 minutes of continuous aerobic without signs/symptoms of physical distress      Intensity   THRR 40-80% of Max Heartrate 103-148    Ratings of Perceived Exertion 11-13    Perceived Dyspnea 0-4      Progression   Progression Continue to progress workloads to maintain intensity without signs/symptoms of physical distress.      Resistance Training   Training Prescription Yes    Weight 3 lb    Reps 10-15          Perform Capillary Blood Glucose checks as needed.  Exercise Prescription Changes:   Exercise Prescription Changes     Row Name 10/22/23 1500 11/01/23 1100 11/15/23 0800 11/27/23 1500 12/12/23 1300     Response to Exercise   Blood Pressure (Admit) 112/72 122/64 118/60 126/78 112/70   Blood Pressure (Exercise) 124/76 150/68 150/74 140/82 132/78   Blood Pressure (Exit) 106/70 110/70 104/68 126/62 106/58   Heart Rate (Admit) 58 bpm 77 bpm 65 bpm 83 bpm 67 bpm   Heart Rate (Exercise) 119 bpm 108 bpm 107 bpm 115 bpm 123 bpm   Heart Rate (Exit) 78 bpm 88 bpm 71 bpm 92 bpm 70 bpm   Oxygen Saturation (Admit) 94 % 96 % 96 % 95 % 96 %   Oxygen Saturation (Exercise) 93 % 95 % 91 % 95 % 94 %   Oxygen Saturation (Exit) 95 % 95 % 96 % 96 % 96 %   Rating of Perceived Exertion (Exercise) 11 12 15 13 15    Perceived Dyspnea (Exercise) 0 1 1 0 2   Symptoms burning in legs none none none none   Comments Results First two days of exercise -- -- --   Duration -- Continue with 30 min of aerobic exercise without signs/symptoms of physical distress. Continue with 30 min of aerobic exercise  without signs/symptoms of physical distress. Continue with 30 min of aerobic exercise without signs/symptoms of physical distress. Continue with 30 min of aerobic exercise without signs/symptoms of physical distress.   Intensity -- THRR unchanged THRR unchanged THRR unchanged THRR unchanged     Progression   Progression -- Continue to progress workloads to maintain intensity without signs/symptoms of physical distress. Continue to progress workloads to maintain intensity without signs/symptoms of physical distress. Continue to progress workloads to maintain intensity without signs/symptoms of physical distress. Continue to progress workloads to maintain intensity without signs/symptoms of physical distress.   Average METs -- 2.03 2.3 2.9 2.64     Resistance Training   Training Prescription -- Yes Yes Yes Yes   Weight -- 3 lb 3 lb 3 lb 3 lb   Reps -- 10-15 10-15 10-15 10-15     Interval Training   Interval Training -- No  No No No     Treadmill   MPH -- 1.3 1.5 1.5 1.6   Grade -- 0.5 3 5 5    Minutes -- 15 15 15 15    METs -- 2.08 2.77 3.18 3.33     Recumbant Bike   Level -- -- 1.5 -- 1.5   Watts -- -- 19 -- 19   Minutes -- -- 15 -- 15   METs -- -- 2.45 -- 2.44     NuStep   Level -- 3 3 3 4    Minutes -- 15 15 15 15    METs -- 2 2.4 2.3 2.5     REL-XR   Level -- -- -- 2 2   Minutes -- -- -- 15 15   METs -- -- -- -- 1.6     T5 Nustep   Level -- -- 2 -- 2   Minutes -- -- 15 -- 15   METs -- -- 2 -- 2     Oxygen   Maintain Oxygen Saturation -- 88% or higher 88% or higher 88% or higher 88% or higher    Row Name 12/12/23 1700 12/27/23 1800 01/10/24 1000 01/23/24 1600       Response to Exercise   Blood Pressure (Admit) -- 112/62 102/62 104/60    Blood Pressure (Exercise) -- 140/80 -- 128/64    Blood Pressure (Exit) -- 108/68 104/62 96/62    Heart Rate (Admit) -- 83 bpm 80 bpm 74 bpm    Heart Rate (Exercise) -- 104 bpm 108 bpm 107 bpm    Heart Rate (Exit) -- 73 bpm 90 bpm 78  bpm    Oxygen Saturation (Admit) -- 98 % 96 % 95 %    Oxygen Saturation (Exercise) -- 94 % 95 % 93 %    Oxygen Saturation (Exit) -- 95 % 96 % 95 %    Rating of Perceived Exertion (Exercise) -- 12 12 14     Perceived Dyspnea (Exercise) -- 1 1 1     Symptoms -- none none none    Duration Continue with 30 min of aerobic exercise without signs/symptoms of physical distress. Continue with 30 min of aerobic exercise without signs/symptoms of physical distress. Continue with 30 min of aerobic exercise without signs/symptoms of physical distress. Continue with 30 min of aerobic exercise without signs/symptoms of physical distress.    Intensity THRR unchanged THRR unchanged THRR unchanged THRR unchanged      Progression   Progression Continue to progress workloads to maintain intensity without signs/symptoms of physical distress. Continue to progress workloads to maintain intensity without signs/symptoms of physical distress. Continue to progress workloads to maintain intensity without signs/symptoms of physical distress. Continue to progress workloads to maintain intensity without signs/symptoms of physical distress.    Average METs 2.64 3.39 2.65 2.67      Resistance Training   Training Prescription Yes Yes Yes Yes    Weight 3 lb 3 lb 3 lb 3 lb    Reps 10-15 10-15 10-15 10-15      Interval Training   Interval Training No No No No      Treadmill   MPH 1.6 4.5 1.6 1.4    Grade 5 1.5 5 4     Minutes 15 15 15 15     METs 3.33 5.38 3.33 2.84      Recumbant Bike   Level 1.5 1.7 -- 1.7    Watts 19 19 -- 20    Minutes 15 15 -- 15    METs  2.44 2.45 -- --      NuStep   Level 4 5 4 4     Minutes 15 15 15 15     METs 2.5 2.9 2 2.9      REL-XR   Level 2 1 -- 2    Minutes 15 15 -- 15    METs 1.6 3.9 -- 2.6      T5 Nustep   Level 2 -- -- 2    Minutes 15 -- -- 15    METs 2 -- -- 2      Biostep-RELP   Level -- -- 2 --    SPM -- -- 50 --    Minutes -- -- 15 --    METs -- -- 2 --      Home  Exercise Plan   Plans to continue exercise at Home (comment)  walking Home (comment)  walking Home (comment)  walking Home (comment)  walking    Frequency Add 2 additional days to program exercise sessions. Add 2 additional days to program exercise sessions. Add 2 additional days to program exercise sessions. Add 2 additional days to program exercise sessions.    Initial Home Exercises Provided 12/12/23 12/12/23 12/12/23 12/12/23      Oxygen   Maintain Oxygen Saturation 88% or higher 88% or higher 88% or higher 88% or higher       Exercise Comments:   Exercise Comments     Row Name 10/24/23 1715           Exercise Comments First full day of exercise!  Patient was oriented to gym and equipment including functions, settings, policies, and procedures.  Patient's individual exercise prescription and treatment plan were reviewed.  All starting workloads were established based on the results of the 6 minute walk test done at initial orientation visit.  The plan for exercise progression was also introduced and progression will be customized based on patient's performance and goals.          Exercise Goals and Review:   Exercise Goals     Row Name 10/22/23 1537             Exercise Goals   Increase Physical Activity Yes       Intervention Provide advice, education, support and counseling about physical activity/exercise needs.;Develop an individualized exercise prescription for aerobic and resistive training based on initial evaluation findings, risk stratification, comorbidities and participant's personal goals.       Expected Outcomes Short Term: Attend rehab on a regular basis to increase amount of physical activity.;Long Term: Exercising regularly at least 3-5 days a week.;Long Term: Add in home exercise to make exercise part of routine and to increase amount of physical activity.       Increase Strength and Stamina Yes       Intervention Provide advice, education, support and  counseling about physical activity/exercise needs.;Develop an individualized exercise prescription for aerobic and resistive training based on initial evaluation findings, risk stratification, comorbidities and participant's personal goals.       Expected Outcomes Short Term: Increase workloads from initial exercise prescription for resistance, speed, and METs.;Short Term: Perform resistance training exercises routinely during rehab and add in resistance training at home;Long Term: Improve cardiorespiratory fitness, muscular endurance and strength as measured by increased METs and functional capacity ( )       Able to understand and use rate of perceived exertion (RPE) scale Yes       Intervention Provide education and explanation on how to  use RPE scale       Expected Outcomes Short Term: Able to use RPE daily in rehab to express subjective intensity level;Long Term:  Able to use RPE to guide intensity level when exercising independently       Able to understand and use Dyspnea scale Yes       Intervention Provide education and explanation on how to use Dyspnea scale       Expected Outcomes Short Term: Able to use Dyspnea scale daily in rehab to express subjective sense of shortness of breath during exertion;Long Term: Able to use Dyspnea scale to guide intensity level when exercising independently       Knowledge and understanding of Target Heart Rate Range (THRR) Yes       Intervention Provide education and explanation of THRR including how the numbers were predicted and where they are located for reference       Expected Outcomes Long Term: Able to use THRR to govern intensity when exercising independently;Short Term: Able to state/look up THRR;Short Term: Able to use daily as guideline for intensity in rehab       Able to check pulse independently Yes       Intervention Provide education and demonstration on how to check pulse in carotid and radial arteries.;Review the importance of being able to  check your own pulse for safety during independent exercise       Expected Outcomes Short Term: Able to explain why pulse checking is important during independent exercise;Long Term: Able to check pulse independently and accurately       Understanding of Exercise Prescription Yes       Intervention Provide education, explanation, and written materials on patient's individual exercise prescription       Expected Outcomes Short Term: Able to explain program exercise prescription;Long Term: Able to explain home exercise prescription to exercise independently          Exercise Goals Re-Evaluation :  Exercise Goals Re-Evaluation     Row Name 10/24/23 1715 11/01/23 1128 11/15/23 0848 11/27/23 1524 12/12/23 1339     Exercise Goal Re-Evaluation   Exercise Goals Review Increase Physical Activity;Able to understand and use rate of perceived exertion (RPE) scale;Knowledge and understanding of Target Heart Rate Range (THRR);Understanding of Exercise Prescription;Increase Strength and Stamina;Able to understand and use Dyspnea scale;Able to check pulse independently Increase Physical Activity;Increase Strength and Stamina;Understanding of Exercise Prescription Increase Physical Activity;Increase Strength and Stamina;Understanding of Exercise Prescription Increase Physical Activity;Increase Strength and Stamina;Understanding of Exercise Prescription Increase Physical Activity;Increase Strength and Stamina;Understanding of Exercise Prescription   Comments Reviewed RPE and dyspnea scale, THR and program prescription with pt today.  Pt voiced understanding and was given a copy of goals to take home. Columbia is off to a good start in the program. She did well on the treadmill at a speed of 1.3 mph with an incline of 0.5% during her first two sessions of rehab. She also did well on the T4 nustep at level 3. We will continue to monitor her progress in the program. Eura is doing well in rehab. She was recently able to  increase her incline on the treadmill from 0.5% to 3% while maintaining a speed of 1.2mph. She was also able to begin using the T5 nustep at level 3. We will continue to monitor her progress in the program. Loghan continues to do well in rehab. She was recently able to increase her incline on the treadmill from 3% to 5% while maintaining a  speed of 1.5 mph. She also began using the XR at level 2 and continues to work at level 3 on the T4 nustep. We will continue to monitor her progress in the program. Emnet is doing well in rehab. She was able to increase her speed on the treadmill from 1.5 to 1. while maintaining 5% incline. She was also able to increase her level on the T4 nustep from 3 to 4. We will continue to monitor her progress in the program.   Expected Outcomes Short: Use RPE daily to regulate intensity.  Long: Follow program prescription in THR. Short: Continue to follow current exercise prescription. Long: Continue exercise to improve strength and stamina. Short: Continue to increase treadmill workload. Long: Continue exercise to improve strength and stamina. Short: Begin to increase speed on the treadmill. Long: Continue exercise to improve strength and stamina. Short: Continue to increase speed on the treadmill. Long: Continue exercise to improve strength and stamina.    Row Name 12/12/23 1728 12/27/23 1804 01/10/24 1043 01/23/24 1606       Exercise Goal Re-Evaluation   Exercise Goals Review Increase Physical Activity;Increase Strength and Stamina;Able to understand and use rate of perceived exertion (RPE) scale;Able to understand and use Dyspnea scale;Knowledge and understanding of Target Heart Rate Range (THRR);Able to check pulse independently;Understanding of Exercise Prescription Increase Physical Activity;Understanding of Exercise Prescription;Increase Strength and Stamina Increase Physical Activity;Understanding of Exercise Prescription;Increase Strength and Stamina Increase  Physical Activity;Understanding of Exercise Prescription;Increase Strength and Stamina    Comments Reviewed home exercise with pt today from 5:20 pm to 5:30 pm.  Pt plans to walk for exercise.  Reviewed THR, pulse, RPE, sign and symptoms, pulse oximetery and when to call 911 or MD.  Also discussed weather considerations and indoor options.  Pt voiced understanding. Daisia continues to do well in rehab. She increased her workload on the treadmill to a speed of 4.5 mph and incline of 1.5%. She also was able to increase to level 5 on the T4 nustep and level 1.7 on the recumbent bike. We will continue to monitor her progress in the program. Barby continues to do well in rehab. She added the biostep at level 2 to her exercise prescription. She was able to increase her incline on the treadmill to 5% with a speed of 1.6 mph. She dropped down to level 4 on the T4 nustep from level 5. We will continue to monitor her progress in the program. Kelby continues to do well in rehab and will graduate soon. She completed her post and improved by 43.6% with 134ft. She maintained level 4 on the T4 nustep, level 2 on the T5 nustep, and level 2 on the XR. We will continue to monitor her progress in the program.    Expected Outcomes Short: add 1-2 days a week of exercise at home on off days of pulmonary rehab. Long: maintain independent exercise routine. Short: Continue to progressively increase treadmill and nustep workloads. Long: Continue exercise to improve strength and stamina. Short: Get back to level 5 on the T4 nustep. Long: Continue exercise to improve strength and stamina. Short: Graduate. Long: Continue to exercise independently.       Discharge Exercise Prescription (Final Exercise Prescription Changes):  Exercise Prescription Changes - 01/23/24 1600       Response to Exercise   Blood Pressure (Admit) 104/60    Blood Pressure (Exercise) 128/64    Blood Pressure (Exit) 96/62    Heart Rate (Admit) 74  bpm  Heart Rate (Exercise) 107 bpm    Heart Rate (Exit) 78 bpm    Oxygen Saturation (Admit) 95 %    Oxygen Saturation (Exercise) 93 %    Oxygen Saturation (Exit) 95 %    Rating of Perceived Exertion (Exercise) 14    Perceived Dyspnea (Exercise) 1    Symptoms none    Duration Continue with 30 min of aerobic exercise without signs/symptoms of physical distress.    Intensity THRR unchanged      Progression   Progression Continue to progress workloads to maintain intensity without signs/symptoms of physical distress.    Average METs 2.67      Resistance Training   Training Prescription Yes    Weight 3 lb    Reps 10-15      Interval Training   Interval Training No      Treadmill   MPH 1.4    Grade 4    Minutes 15    METs 2.84      Recumbant Bike   Level 1.7    Watts 20    Minutes 15      NuStep   Level 4    Minutes 15    METs 2.9      REL-XR   Level 2    Minutes 15    METs 2.6      T5 Nustep   Level 2    Minutes 15    METs 2      Home Exercise Plan   Plans to continue exercise at Home (comment)   walking   Frequency Add 2 additional days to program exercise sessions.    Initial Home Exercises Provided 12/12/23      Oxygen   Maintain Oxygen Saturation 88% or higher          Nutrition:  Target Goals: Understanding of nutrition guidelines, daily intake of sodium 1500mg , cholesterol 200mg , calories 30% from fat and 7% or less from saturated fats, daily to have 5 or more servings of fruits and vegetables.  Education: All About Nutrition: -Group instruction provided by verbal, written material, interactive activities, discussions, models, and posters to present general guidelines for heart healthy nutrition including fat, fiber, MyPlate, the role of sodium in heart healthy nutrition, utilization of the nutrition label, and utilization of this knowledge for meal planning. Follow up email sent as well. Written material given at graduation. Flowsheet Row  Pulmonary Rehab from 01/23/2024 in [facility] Cardiac and Pulmonary Rehab  Education need identified 10/22/23  Date 01/09/24  [part 2]  Educator JG  Instruction Review Code 1- Verbalizes Understanding    Biometrics:  Pre Biometrics - 10/22/23 1537       Pre Biometrics   Height 5' 6.5 (1.689 m)    Weight 290 lb 14.4 oz (132 kg)    Waist Circumference 50.5 inches    Hip Circumference 55 inches    Waist to Hip Ratio 0.92 %    BMI (Calculated) 46.25    Single Leg Stand 3.45 seconds          Post Biometrics - 01/17/24 1745        Post  Biometrics   Height 5' 6.5 (1.689 m)    Weight 299 lb (135.6 kg)    BMI (Calculated) 47.54    Single Leg Stand 2.85 seconds          Nutrition Therapy Plan and Nutrition Goals:  Nutrition Therapy & Goals - 10/22/23 1449       Nutrition Therapy  Protein (specify units) 70-90    Fiber 25 grams    Whole Grain Foods 3 servings    Saturated Fats 15 max. grams    Fruits and Vegetables 5 servings/day    Sodium 2 grams      Personal Nutrition Goals   Nutrition Goal Eat 15-30gProtein and 30-60gCarbs at each meal.    Personal Goal #2 Read labels and reduce sodium intake to below 2300mg . Ideally 1500mg  per day.    Personal Goal #3 Include more colorful produce, aim for 5-8 servings of fruits and veggies per day    Comments Patient drinking less sugary beverages. Has been trying to better control her DM. Discussed some of her meals. Breakfast being oatmeal with cranberries and raisin with a fruit smoothie. Together walked through her breakfast highlighting  her high carb intake and lack of protein. Provided handout on pairing carbs with protein. Educated on carb sources and set goal of eating ~30-60g per meal. Reviewed mediterranean diet handout and educated on types of fats sources and how to read facts labels. She would like to meal prep more and include more veggies at meals.      Intervention Plan   Intervention Nutrition handout(s) given to  patient.;Prescribe, educate and counsel regarding individualized specific dietary modifications aiming towards targeted core components such as weight, hypertension, lipid management, diabetes, heart failure and other comorbidities.    Expected Outcomes Long Term Goal: Adherence to prescribed nutrition plan.;Short Term Goal: A plan has been developed with personal nutrition goals set during dietitian appointment.;Short Term Goal: Understand basic principles of dietary content, such as calories, fat, sodium, cholesterol and nutrients.          Nutrition Assessments:  MEDIFICTS Score Key: >=70 Need to make dietary changes  40-70 Heart Healthy Diet <= 40 Therapeutic Level Cholesterol Diet  Flowsheet Row Pulmonary Rehab from 01/21/2024 in [facility] Cardiac and Pulmonary Rehab  Picture Your Plate Total Score on Discharge 65   Picture Your Plate Scores: <59 Unhealthy dietary pattern with much room for improvement. 41-50 Dietary pattern unlikely to meet recommendations for good health and room for improvement. 51-60 More healthful dietary pattern, with some room for improvement.  >60 Healthy dietary pattern, although there may be some specific behaviors that could be improved.   Nutrition Goals Re-Evaluation:  Nutrition Goals Re-Evaluation     Row Name 11/22/23 1719 12/26/23 1735 01/16/24 1734         Goals   Current Weight -- -- 295 lb (133.8 kg)     Nutrition Goal -- -- Work on eating healthier snacks     Comment Patient was informed on why it is important to maintain a balanced diet when dealing with Respiratory issues. Explained that it takes a lot of energy to breath and when they are short of breath often they will need to have a good diet to help keep up with the calories they are expending for breathing. Patient reports that she is trying to read food labels and avoid sodium. She does not add salt to anything and makes an effort to low sodium foods. Deannie is working on diet and losing  weight. She makes some good choices but some other times she eats chips and sweets. Informed her to check how many calories she is intaking for a few days.     Expected Outcome Short: Choose and plan snacks accordingly to patients caloric intake to improve breathing. Long: Maintain a diet independently that meets their caloric intake to aid in  daily shortness of breath. Short: continue to get comfortable with reading and understanding food labels. Long: maintain heart healthy diet. Short: watch calorie intake. Long: adhere to a diet that pertains to her.        Nutrition Goals Discharge (Final Nutrition Goals Re-Evaluation):  Nutrition Goals Re-Evaluation - 01/16/24 1734       Goals   Current Weight 295 lb (133.8 kg)    Nutrition Goal Work on eating healthier snacks    Comment Ceylin is working on diet and losing weight. She makes some good choices but some other times she eats chips and sweets. Informed her to check how many calories she is intaking for a few days.    Expected Outcome Short: watch calorie intake. Long: adhere to a diet that pertains to her.          Psychosocial: Target Goals: Acknowledge presence or absence of significant depression and/or stress, maximize coping skills, provide positive support system. Participant is able to verbalize types and ability to use techniques and skills needed for reducing stress and depression.   Education: Stress, Anxiety, and Depression - Group verbal and visual presentation to define topics covered.  Reviews how body is impacted by stress, anxiety, and depression.  Also discusses healthy ways to reduce stress and to treat/manage anxiety and depression.  Written material given at graduation. Flowsheet Row Pulmonary Rehab from 01/23/2024 in [facility] Cardiac and Pulmonary Rehab  Date 12/05/23  Educator [facility]  Instruction Review Code 1- [employer] Understanding    Education: Sleep Hygiene -Provides group verbal and written instruction about how  sleep can affect your health.  Define sleep hygiene, discuss sleep cycles and impact of sleep habits. Review good sleep hygiene tips.    Initial Review & Psychosocial Screening:  Initial Psych Review & Screening - 10/11/23 1256       Initial Review   Current issues with Current Stress Concerns    Source of Stress Concerns Chronic Illness    Comments depression comes and goes does not last   seeing a therapist.      Family Dynamics   Good Support System? Yes   family, Mom     Barriers   Psychosocial barriers to participate in program There are no identifiable barriers or psychosocial needs.      Screening Interventions   Interventions To provide support and resources with identified psychosocial needs;Provide feedback about the scores to participant    Expected Outcomes Short Term goal: Utilizing psychosocial counselor, staff and physician to assist with identification of specific Stressors or current issues interfering with healing process. Setting desired goal for each stressor or current issue identified.;Long Term Goal: Stressors or current issues are controlled or eliminated.;Short Term goal: Identification and review with participant of any Quality of Life or Depression concerns found by scoring the questionnaire.;Long Term goal: The participant improves quality of Life and PHQ9 Scores as seen by post scores and/or verbalization of changes          Quality of Life Scores:  Scores of 19 and below usually indicate a poorer quality of life in these areas.  A difference of  2-3 points is a clinically meaningful difference.  A difference of 2-3 points in the total score of the Quality of Life Index has been associated with significant improvement in overall quality of life, self-image, physical symptoms, and general health in studies assessing change in quality of life.  PHQ-9: Review Flowsheet  More data exists      01/21/2024 10/22/2023  03/14/2022 09/16/2021 04/22/2018  Depression  screen PHQ 2/9  Decreased Interest 0 1 0 1 0  Down, Depressed, Hopeless 1 1 0 1 0  PHQ - 2 Score 1 2 0 2 0  Altered sleeping 1 0 - 2 -  Tired, decreased energy 2 0 - 1 -  Change in appetite 2 0 - 1 -  Feeling bad or failure about yourself  1 1 - 0 -  Trouble concentrating 1 0 - 1 -  Moving slowly or fidgety/restless 0 0 - 0 -  Suicidal thoughts 0 0 - 0 -  PHQ-9 Score 8 3 - 7 -  Difficult doing work/chores Not difficult at all Not difficult at all - Somewhat difficult -   Interpretation of Total Score  Total Score Depression Severity:  1-4 = Minimal depression, 5-9 = Mild depression, 10-14 = Moderate depression, 15-19 = Moderately severe depression, 20-27 = Severe depression   Psychosocial Evaluation and Intervention:  Psychosocial Evaluation - 10/11/23 1306       Psychosocial Evaluation & Interventions   Interventions Encouraged to exercise with the program and follow exercise prescription    Comments There are no barriers to starting the program.  She has support from her family, mostly her Mom.  She does see a therapist, on no meds at this time.  Recognizes that she has has had depression at times and it does not last long.  She is ready to learn about managing her weight and improving her lifestyle.    Expected Outcomes STG attend all scheduled sessions, meet with RD  for diabetes and weight loss guidance, and follow the guidance for  exercise progression   LTG Able to maintain her exercise progression, continues to work on her weight loss goals and managing her diabetes    Continue Psychosocial Services  Follow up required by staff          Psychosocial Re-Evaluation:  Psychosocial Re-Evaluation     Row Name 11/22/23 1720 12/26/23 1732 01/16/24 1740         Psychosocial Re-Evaluation   Current issues with Current Depression Current Depression Current Depression     Comments Minetta meets with her therapist about once a week. She states that it is helping her alot. Her  depression stems from anxiety. Alejah states that she has no changes in her sleep, stress, and mental health. She feels stable in this area and continues to meet with her therapist. Patient reports no  new ssues with their current mental states, sleep, stress, depression or anxiety. Will follow up with patient in a few weeks for any changes.     Expected Outcomes Short:continue meeting with therapist. Long: maintain a healthy mental state. Short:Continue to meet with therapist and monitor for any changes in mental state. Long: maintain good mental health habits. Short: Continue to exercise regularly to support mental health and notify staff of any changes. Long: maintain mental health and well being through teaching of rehab or prescribed medications independently.     Interventions Encouraged to attend Pulmonary Rehabilitation for the exercise Encouraged to attend Pulmonary Rehabilitation for the exercise Encouraged to attend Pulmonary Rehabilitation for the exercise     Continue Psychosocial Services  Follow up required by staff Follow up required by staff Follow up required by staff        Psychosocial Discharge (Final Psychosocial Re-Evaluation):  Psychosocial Re-Evaluation - 01/16/24 1740       Psychosocial Re-Evaluation   Current issues with Current Depression  Comments Patient reports no  new ssues with their current mental states, sleep, stress, depression or anxiety. Will follow up with patient in a few weeks for any changes.    Expected Outcomes Short: Continue to exercise regularly to support mental health and notify staff of any changes. Long: maintain mental health and well being through teaching of rehab or prescribed medications independently.    Interventions Encouraged to attend Pulmonary Rehabilitation for the exercise    Continue Psychosocial Services  Follow up required by staff          Education: Education Goals: Education classes will be provided on a weekly basis,  covering required topics. Participant will state understanding/return demonstration of topics presented.  Learning Barriers/Preferences:  Learning Barriers/Preferences - 10/11/23 1259       Learning Barriers/Preferences   Learning Barriers Hearing   deaf in left ear,   Learning Preferences None          General Pulmonary Education Topics:  Infection Prevention: - Provides verbal and written material to individual with discussion of infection control including proper hand washing and proper equipment cleaning during exercise session. Flowsheet Row Pulmonary Rehab from 01/23/2024 in [facility] Cardiac and Pulmonary Rehab  Date 10/22/23  Educator NT  Instruction Review Code 1- Verbalizes Understanding    Falls Prevention: - Provides verbal and written material to individual with discussion of falls prevention and safety. Flowsheet Row Pulmonary Rehab from 01/23/2024 in [facility] Cardiac and Pulmonary Rehab  Date 10/11/23  Educator SB  Instruction Review Code 1- Verbalizes Understanding    Chronic Lung Disease Review: - Group verbal instruction with posters, models, PowerPoint presentations and videos,  to review new updates, new respiratory medications, new advancements in procedures and treatments. Providing information on websites and 800 numbers for continued self-education. Includes information about supplement oxygen, available portable oxygen systems, continuous and intermittent flow rates, oxygen safety, concentrators, and Medicare reimbursement for oxygen. Explanation of Pulmonary Drugs, including class, frequency, complications, importance of spacers, rinsing mouth after steroid MDI's, and proper cleaning methods for nebulizers. Review of basic lung anatomy and physiology related to function, structure, and complications of lung disease. Review of risk factors. Discussion about methods for diagnosing sleep apnea and types of masks and machines for OSA. Includes a review of the use of  types of environmental controls: home humidity, furnaces, filters, dust mite/pet prevention, HEPA vacuums. Discussion about weather changes, air quality and the benefits of nasal washing. Instruction on Warning signs, infection symptoms, calling MD promptly, preventive modes, and value of vaccinations. Review of effective airway clearance, coughing and/or vibration techniques. Emphasizing that all should Create an Action Plan. Written material given at graduation. Flowsheet Row Pulmonary Rehab from 01/23/2024 in [facility] Cardiac and Pulmonary Rehab  Date 11/28/23  Educator [facility]  Instruction Review Code 1- Verbalizes Understanding    AED/CPR: - Group verbal and written instruction with the use of models to demonstrate the basic use of the AED with the basic ABC's of resuscitation.    Anatomy and Cardiac Procedures: - Group verbal and visual presentation and models provide information about basic cardiac anatomy and function. Reviews the testing methods done to diagnose heart disease and the outcomes of the test results. Describes the treatment choices: Medical Management, Angioplasty, or Coronary Bypass Surgery for treating various heart conditions including Myocardial Infarction, Angina, Valve Disease, and Cardiac Arrhythmias.  Written material given at graduation. Flowsheet Row Pulmonary Rehab from 01/23/2024 in [facility] Cardiac and Pulmonary Rehab  Education need identified 10/22/23  Date 11/07/23  Educator SB  Instruction Review Code 1- Verbalizes Understanding    Medication Safety: - Group verbal and visual instruction to review commonly prescribed medications for heart and lung disease. Reviews the medication, class of the drug, and side effects. Includes the steps to properly store meds and maintain the prescription regimen.  Written material given at graduation. Flowsheet Row Pulmonary Rehab from 01/23/2024 in [facility] Cardiac and Pulmonary Rehab  Date 01/23/24  Educator [facility]  Instruction Review Code  1- Verbalizes Understanding    Other: -Provides group and verbal instruction on various topics (see comments)   Knowledge Questionnaire Score:  Knowledge Questionnaire Score - 01/21/24 1633       Knowledge Questionnaire Score   Post Score 16/18           Core Components/Risk Factors/Patient Goals at Admission:  Personal Goals and Risk Factors at Admission - 10/11/23 1301       Core Components/Risk Factors/Patient Goals on Admission    Weight Management Yes    Intervention Weight Management: Develop a combined nutrition and exercise program designed to reach desired caloric intake, while maintaining appropriate intake of nutrient and fiber, sodium and fats, and appropriate energy expenditure required for the weight goal.    Admit Weight 288 lb (130.6 kg)    Goal Weight: Short Term 286 lb (129.7 kg)    Goal Weight: Long Term 239 lb (108.4 kg)    Expected Outcomes Short Term: Continue to assess and modify interventions until short term weight is achieved;Long Term: Adherence to nutrition and physical activity/exercise program aimed toward attainment of established weight goal;Weight Loss: Understanding of general recommendations for a balanced deficit meal plan, which promotes 1-2 lb weight loss per week and includes a negative energy balance of [id number] kcal/d    Improve shortness of breath with ADL's Yes    Intervention Provide education, individualized exercise plan and daily activity instruction to help decrease symptoms of SOB with activities of daily living.    Expected Outcomes Short Term: Improve cardiorespiratory fitness to achieve a reduction of symptoms when performing ADLs;Long Term: Be able to perform more ADLs without symptoms or delay the onset of symptoms    Increase knowledge of respiratory medications and ability to use respiratory devices properly  Yes    Intervention Provide education and demonstration as needed of appropriate use of medications, inhalers, and oxygen  therapy.    Expected Outcomes Short Term: Achieves understanding of medications use. Understands that oxygen is a medication prescribed by physician. Demonstrates appropriate use of inhaler and oxygen therapy.;Long Term: Maintain appropriate use of medications, inhalers, and oxygen therapy.    Diabetes Yes    Intervention Provide education about signs/symptoms and action to take for hypo/hyperglycemia.;Provide education about proper nutrition, including hydration, and aerobic/resistive exercise prescription along with prescribed medications to achieve blood glucose in normal ranges: Fasting glucose 65-99 mg/dL    Expected Outcomes Short Term: Participant verbalizes understanding of the signs/symptoms and immediate care of hyper/hypoglycemia, proper foot care and importance of medication, aerobic/resistive exercise and nutrition plan for blood glucose control.;Long Term: Attainment of HbA1C < 7%.    Heart Failure Yes    Intervention Provide a combined exercise and nutrition program that is supplemented with education, support and counseling about heart failure. Directed toward relieving symptoms such as shortness of breath, decreased exercise tolerance, and extremity edema.    Expected Outcomes Improve functional capacity of life;Short term: Attendance in program 2-3 days a week with increased exercise capacity. Reported lower sodium intake. Reported increased  fruit and vegetable intake. Reports medication compliance.;Short term: Daily weights obtained and reported for increase. Utilizing diuretic protocols set by physician.;Long term: Adoption of self-care skills and reduction of barriers for early signs and symptoms recognition and intervention leading to self-care maintenance.    Hypertension Yes    Intervention Provide education on lifestyle modifcations including regular physical activity/exercise, weight management, moderate sodium restriction and increased consumption of fresh fruit, vegetables, and  low fat dairy, alcohol moderation, and smoking cessation.;Monitor prescription use compliance.    Expected Outcomes Short Term: Continued assessment and intervention until BP is < 140/19mm HG in hypertensive participants. < 130/42mm HG in hypertensive participants with diabetes, heart failure or chronic kidney disease.;Long Term: Maintenance of blood pressure at goal levels.    Lipids Yes    Intervention Provide education and support for participant on nutrition & aerobic/resistive exercise along with prescribed medications to achieve LDL 70mg , HDL >40mg .    Expected Outcomes Short Term: Participant states understanding of desired cholesterol values and is compliant with medications prescribed. Participant is following exercise prescription and nutrition guidelines.;Long Term: Cholesterol controlled with medications as prescribed, with individualized exercise RX and with personalized nutrition plan. Value goals: LDL < 70mg , HDL > 40 mg.          Education:Diabetes - Individual verbal and written instruction to review signs/symptoms of diabetes, desired ranges of glucose level fasting, after meals and with exercise. Acknowledge that pre and post exercise glucose checks will be done for 3 sessions at entry of program. Flowsheet Row Pulmonary Rehab from 01/23/2024 in [facility] Cardiac and Pulmonary Rehab  Date 10/22/23  Educator NT  Instruction Review Code 1- Verbalizes Understanding    Know Your Numbers and Heart Failure: - Group verbal and visual instruction to discuss disease risk factors for cardiac and pulmonary disease and treatment options.  Reviews associated critical values for Overweight/Obesity, Hypertension, Cholesterol, and Diabetes.  Discusses basics of heart failure: signs/symptoms and treatments.  Introduces Heart Failure Zone chart for action plan for heart failure.  Written material given at graduation. Flowsheet Row Pulmonary Rehab from 01/23/2024 in [facility] Cardiac and Pulmonary Rehab   Date 11/21/23  Educator SB  Instruction Review Code 1- Verbalizes Understanding    Core Components/Risk Factors/Patient Goals Review:   Goals and Risk Factor Review     Row Name 11/22/23 1718 12/26/23 1737 01/16/24 1729         Core Components/Risk Factors/Patient Goals Review   Personal Goals Review Improve shortness of breath with ADL's Hypertension;Diabetes Weight Management/Obesity     Review Spoke to patient about their shortness of breath and what they can do to improve. Patient has been informed of breathing techniques when starting the program. Patient is informed to tell staff if they have had any med changes and that certain meds they are taking or not taking can be causing shortness of breath. Patient reports that she owns a BP cuff and glucometer but that she does not regulary check her blood sugar and BP at home. She was encouraged to do so. She does follow up with her doctor regularly to monitor risk factors. Abriel wants to start out getting down to 280 pounds. Her long term is reaching a goal of 220 pounds. She weighs 295 pounds currently. She is going to wotk on losing a few pounds in the next couple weeks.     Expected Outcomes Short: Attend [facility] regularly to improve shortness of breath with ADL's. Long: maintain independence with ADL's Short: start checking BP and  BG at home consistently. Long: control risk factors. with healthy lifestyle and support from medical team. Short: lose 5 pounds. Long: reach weight goal.        Core Components/Risk Factors/Patient Goals at Discharge (Final Review):   Goals and Risk Factor Review - 01/16/24 1729       Core Components/Risk Factors/Patient Goals Review   Personal Goals Review Weight Management/Obesity    Review Summerlyn wants to start out getting down to 280 pounds. Her long term is reaching a goal of 220 pounds. She weighs 295 pounds currently. She is going to wotk on losing a few pounds in the next couple weeks.     Expected Outcomes Short: lose 5 pounds. Long: reach weight goal.          ITP Comments:  ITP Comments     Row Name 10/11/23 1311 10/22/23 1532 10/24/23 1714 11/07/23 1212 12/05/23 1349   ITP Comments Virtual orientation call completed today. shehas an appointment on Date: 10/22/2023  for EP eval and gym Orientation.  Documentation of diagnosis can be found in [facility] 08/28/2023 . Completed and gym orientation. Initial ITP created and sent for review to Dr. Faud Aleskerov, Medical Director. First full day of exercise!  Patient was oriented to gym and equipment including functions, settings, policies, and procedures.  Patient's individual exercise prescription and treatment plan were reviewed.  All starting workloads were established based on the results of the 6 minute walk test done at initial orientation visit.  The plan for exercise progression was also introduced and progression will be customized based on patient's performance and goals. 30 Day review completed. Medical Director ITP review done, changes made as directed, and signed approval by Medical Director.    new to program 30 Day review completed. Medical Director ITP review done, changes made as directed, and signed approval by Medical Director.    Row Name 01/02/24 1027 01/30/24 1226 01/31/24 1716       ITP Comments 30 Day review completed. Medical Director ITP review done, changes made as directed, and signed approval by Medical Director. 30 Day review completed. Medical Director ITP review done, changes made as directed, and signed approval by Medical Director. Umi graduated today from  rehab with 36 sessions completed.  Details of the patient's exercise prescription and what She needs to do in order to continue the prescription and progress were discussed with patient.  Patient was given a copy of prescription and goals.  Patient verbalized understanding. Marleni plans to continue to exercise by walking at home.        Comments:  Discharge ITP

## 2024-01-31 NOTE — Progress Notes (Signed)
 Daily Session Note  Patient Details  Name: Heather Norman MRN: [id number] Date of Birth: August 22, 1975 Referring Provider:   Flowsheet Row Pulmonary Rehab from 10/22/2023 in [facility] Cardiac and Pulmonary Rehab  Referring Provider Dr. Evalene Lunger, MD    Encounter Date: 01/31/2024  Check In:  Session Check In - 01/31/24 1714       Check-In   Supervising physician immediately available to respond to emergencies See telemetry face sheet for immediately available ER MD    Location [facility]-Cardiac & Pulmonary Rehab    Staff Present Hoy Rodney RN,BSN;[facility] BS, Exercise Physiologist    Virtual Visit No    Medication changes reported     No    Fall or balance concerns reported    No    Warm-up and Cool-down Performed on first and last piece of equipment    Resistance Training Performed Yes    VAD Patient? No    PAD/SET Patient? No      Pain Assessment   Currently in Pain? No/denies             Social History   Tobacco Use  Smoking Status Never  Smokeless Tobacco Never    Goals Met:  Independence with exercise equipment Exercise tolerated well No report of concerns or symptoms today Strength training completed today  Goals Unmet:  Not Applicable  Comments:  Heather Norman graduated today from  rehab with 36 sessions completed.  Details of the patient's exercise prescription and what She needs to do in order to continue the prescription and progress were discussed with patient.  Patient was given a copy of prescription and goals.  Patient verbalized understanding. Heather Norman plans to continue to exercise by walking at home.     Dr. Oneil Pinal is Medical Director for [facility] Cardiac Rehabilitation.  Dr. Fuad Aleskerov is Medical Director for [facility] Pulmonary Rehabilitation.

## 2024-01-31 NOTE — Progress Notes (Signed)
 Early Discharge Summary   Heather Norman DOB: 13-Apr-1976   [name] graduated today from  rehab with 36 sessions completed.  Details of the patient's exercise prescription and what She needs to do in order to continue the prescription and progress were discussed with patient.  Patient was given a copy of prescription and goals.  Patient verbalized understanding. [name] plans to continue to exercise by walking at home.   6 Minute Walk     Row Name 10/22/23 1534 01/17/24 1738       6 Minute Walk   Phase Initial Discharge    Distance 940 feet 1350 feet    Distance % Change -- 410 %    Distance Feet Change -- 410 ft    Walk Time 6 minutes 6 minutes    # of Rest Breaks 0 0    MPH 1.78 2.56    METS 2.46 2.99    RPE 11 12    Perceived Dyspnea  0 0    VO2 Peak 8.6 10.47    Symptoms Yes (comment) No    Comments burning in legs --    Resting HR 58 bpm 74 bpm    Resting BP 112/72 104/60    Resting Oxygen Saturation  94 % 95 %    Exercise Oxygen Saturation  during 6 min walk 93 % 90 %    Max Ex. HR 119 bpm 104 bpm    Max Ex. BP 124/76 128/64    2 Minute Post BP 106/70 112/60      Interval HR   1 Minute HR 111 100    2 Minute HR 101 101    3 Minute HR 108 100    4 Minute HR 84 102    5 Minute HR 108 100    6 Minute HR 119 104    2 Minute Post HR 78 68    Interval Heart Rate? Yes Yes      Interval Oxygen   Interval Oxygen? Yes Yes    Baseline Oxygen Saturation % 94 % 95 %    1 Minute Oxygen Saturation % 93 % 94 %    1 Minute Liters of Oxygen 0 L  RA 0 L    2 Minute Oxygen Saturation % 94 % 95 %    2 Minute Liters of Oxygen 0 L 0 L    3 Minute Oxygen Saturation % 93 % 90 %    3 Minute Liters of Oxygen 0 L 0 L    4 Minute Oxygen Saturation % 95 % 91 %    4 Minute Liters of Oxygen 0 L 0 L    5 Minute Oxygen Saturation % 94 % 91 %    5 Minute Liters of Oxygen 0 L 0 L    6 Minute Oxygen Saturation % 94 % 95 %    6 Minute Liters of Oxygen 0 L 0 L    2 Minute Post Oxygen Saturation  % 95 % 95 %    2 Minute Post Liters of Oxygen 0 L 0 L

## 2024-02-04 ENCOUNTER — Encounter

## 2024-02-06 ENCOUNTER — Encounter

## 2024-02-07 ENCOUNTER — Encounter

## 2024-02-25 ENCOUNTER — Telehealth: Payer: Self-pay | Admitting: Family

## 2024-02-26 MED ORDER — CARVEDILOL 25 MG PO TABS: 12.5000 mg | ORAL_TABLET | Freq: Two times a day (BID) | ORAL | 3 refills | Status: DC

## 2024-02-26 NOTE — Telephone Encounter (Signed)
 Refill sent to preferred pharmacy.

## 2024-03-03 ENCOUNTER — Ambulatory Visit (HOSPITAL_COMMUNITY): Payer: Self-pay | Admitting: Cardiology

## 2024-03-03 ENCOUNTER — Other Ambulatory Visit
Admission: RE | Admit: 2024-03-03 | Discharge: 2024-03-03 | Disposition: A | Discharge status: Home / Self Care | Attending: Family | Admitting: Family

## 2024-03-03 DIAGNOSIS — I5032 Chronic diastolic (congestive) heart failure: Secondary | ICD-10-CM | POA: Insufficient documentation

## 2024-03-03 DIAGNOSIS — N1832 Chronic kidney disease, stage 3b: Secondary | ICD-10-CM

## 2024-03-03 LAB — BASIC METABOLIC PANEL WITH GFR
Anion gap: 11 (ref 5–15)
BUN: 46 mg/dL — ABNORMAL HIGH (ref 6–20)
CO2: 28 mmol/L (ref 22–32)
Calcium: 9.4 mg/dL (ref 8.9–10.3)
Chloride: 99 mmol/L (ref 98–111)
Creatinine, Ser: 2.11 mg/dL — ABNORMAL HIGH (ref 0.44–1.00)
GFR, Estimated: 28 mL/min — ABNORMAL LOW (ref >60)
Glucose, Bld: 193 mg/dL — ABNORMAL HIGH (ref 70–99)
Potassium: 4.1 mmol/L (ref 3.5–5.1)
Sodium: 138 mmol/L (ref 135–145)

## 2024-03-04 MED ORDER — TORSEMIDE 20 MG PO TABS
ORAL_TABLET | ORAL | Status: AC
Start: 2024-03-04 — End: ?

## 2024-03-04 NOTE — Telephone Encounter (Signed)
 Pt states she has been taking Torsemide  80 mg q a.m. and 40 mg q p.m.and metolazone  on Friday. She states yesterday I had really terrible right lower flank pain and spasms. It was so bad I almost went to the hospital. She denies any flank pain today and says it gradually went away on it's own.  She verbalizes agreement to stop metolazone  and return in 10-14 days for repeat lab work.  She states she attempted to call her kidney doctor yesterday because she is  really concerned about my kidneys but she was unable to reach. Provider notified and medication list updated.

## 2024-03-04 NOTE — Telephone Encounter (Signed)
-----   Message from Ellouise DELENA Class sent at 03/04/2024  9:39 AM EDT ----- See Gib message.  ----- Message ----- From: Interface, Lab In [place] Sent: 03/03/2024   3:56 PM EDT To: Ellouise DELENA Class, FNP

## 2024-03-24 ENCOUNTER — Ambulatory Visit: Payer: Self-pay | Admitting: Family

## 2024-03-24 ENCOUNTER — Other Ambulatory Visit
Admission: RE | Admit: 2024-03-24 | Discharge: 2024-03-24 | Disposition: A | Discharge status: Home / Self Care | Attending: Family | Admitting: Family

## 2024-03-24 DIAGNOSIS — I5032 Chronic diastolic (congestive) heart failure: Secondary | ICD-10-CM | POA: Diagnosis present

## 2024-03-24 DIAGNOSIS — E876 Hypokalemia: Secondary | ICD-10-CM

## 2024-03-24 DIAGNOSIS — N1832 Chronic kidney disease, stage 3b: Secondary | ICD-10-CM | POA: Diagnosis present

## 2024-03-24 LAB — BASIC METABOLIC PANEL WITH GFR
Anion gap: 10 (ref 5–15)
BUN: 26 mg/dL — ABNORMAL HIGH (ref 6–20)
CO2: 26 mmol/L (ref 22–32)
Calcium: 9.3 mg/dL (ref 8.9–10.3)
Chloride: 102 mmol/L (ref 98–111)
Creatinine, Ser: 1.9 mg/dL — ABNORMAL HIGH (ref 0.44–1.00)
GFR, Estimated: 32 mL/min — ABNORMAL LOW (ref >60)
Glucose, Bld: 171 mg/dL — ABNORMAL HIGH (ref 70–99)
Potassium: 5.3 mmol/L — ABNORMAL HIGH (ref 3.5–5.1)
Sodium: 138 mmol/L (ref 135–145)

## 2024-03-24 LAB — BRAIN NATRIURETIC PEPTIDE: B Natriuretic Peptide: 122.6 pg/mL — ABNORMAL HIGH (ref 0.0–100.0)

## 2024-03-24 MED ORDER — POTASSIUM CHLORIDE 20 MEQ/15ML (10%) PO SOLN
60.0000 meq | Freq: Every day | ORAL | 6 refills | Status: AC
Start: 2024-03-24 — End: 2025-03-24

## 2024-03-24 NOTE — Telephone Encounter (Signed)
 Spoke to pt about medication changes and lab work. No further questions at this time.

## 2024-03-26 ENCOUNTER — Telehealth: Payer: Self-pay

## 2024-03-26 NOTE — Telephone Encounter (Signed)
 Contacted patient to see if she has been using coupon card? Said she found out around in June PA declined and had not had a chance to go to pharmacy to pick up Rx with coupon card. Went yesterday and was advised Rx out of stock and was going to be ordered. She will give coupon card a try and follow up with us . Disregad PA for now.

## 2024-04-07 ENCOUNTER — Other Ambulatory Visit (HOSPITAL_COMMUNITY): Payer: Self-pay

## 2024-04-07 ENCOUNTER — Telehealth: Payer: Self-pay

## 2024-04-07 ENCOUNTER — Encounter: Admitting: Family

## 2024-04-07 ENCOUNTER — Telehealth: Payer: Self-pay | Admitting: Family

## 2024-04-07 NOTE — Telephone Encounter (Signed)
 Called to confirm/remind patient of their appointment [facility] on [facility].   Appointment:   [x] Confirmed  [] Left mess   [] No answer/No voice mail  [] VM Full/unable to leave message  [] Phone not in service  Patient reminded to bring all medications and/or complete list.  Confirmed patient has transportation. Gave directions, instructed to utilize valet parking.

## 2024-04-07 NOTE — Telephone Encounter (Signed)
 Patient will need to establish with [facility] for any medication rx. Last seen by Ellouise Console, PA-C under [facility] physicians. Nothing further needed at this time until patient establishes care with [facility].  Thank you!

## 2024-04-07 NOTE — Telephone Encounter (Signed)
 Pharmacy Patient Advocate Encounter   Received notification from CoverMyMeds that prior authorization for Pantoprazole  40MG  tablet is required/requested.   Insurance verification completed.   The patient is insured through [employer] .   Per test claim: xxx

## 2024-04-07 NOTE — Progress Notes (Unsigned)
 ADVANCED HF CLINIC NOTE   Primary Care: Osa Geralds, NP Primary Cardiologist: Evalene Lunger, MD  Chief Complaint: shortness of breath   HPI:  Heather Norman is a 48 y/o female with a history of arthritis, DM, hyperlipidemia, HTN, morbid obesity and chronic diastolic heart failure.   Zio 10/21 12.5% PVCs Saw EP Marny) and suggested possible AAD (flecainide or propafenone) if developed symptoms or LV dysfunction.   Echo 02/10/21 EF 50-55% G1DD   Echo 01/26/22 EF 55-60% mild LVH and trivial MR.  Sleep study 07/24/22: AHI 35.4 Sats down to 75%.  Possible AF during study lasting 4.5 hours. Zio placed    Zio patch 1/24  - Sinus rhythm. No AF - 44 runs NSVT (longest 8 beats)  - 21.3% PVCs (3 morphologies 9.7%, 7.3%, 4.3%)  Saw Dr. Cindie in 3/24. felt not to be ablation candidate due to obesity. Recommended continue carvedilol  as EF stable  Started on CPAP in 2024. Wore CPAP for awhile but then got bronchitis.   Seen in ER 10/30/22 for CP. Hstrop, ECG and d-Dimer all normal. BNP elevated 47 -> 480 so torsemide  increased.  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   Seen in ED on 08/15/23 for volume overload. Got IV lasix . Increased metolazone  for 1 to 2 x/week. Unfortunately developed low BP and AKI (Scr 1.5 -> 2.5) and admitted 08/22/23. Given IVF.   Echo 1/25 EF 60-65% G1DD  RV mildly down.   Seen in Urgent Care 06/25 and diagnosed with pneumonia.   Seen in [facility] 06/25 where torsemide  was increased  to 80/60mg  due to being fluid up. Entresto  was decreased to 24/26mg  BID due to hypotension.   She presents today for a HF follow-up visit with a chief complaint of shortness of breath. She states her symptoms have improved since her last visit 01/2024. She endorses palpitations due to her PVCs, which is her baseline. Denies chest pain, lightheadedness, edema, cough or difficulty sleeping. Has tolerated the increase  in torsemide  without any issues that she's aware of. No further respiratory complications endorsed.   In August attended a work wellness clinic, A1C was 11.2%. On 8/25 was started on Lantus 10 units every night. Reports stable glucose levels.   ED visit around labor day due to numbness and pain on ambulation on left foot. Patient presents today with foot boot in place and was prescribed prednisone  as needed for inflammation. When at home she uses a foot arch. She endorses no limitations to physical activity. Questionable for arthritis vs peripheral neuropathy.   Today she further endorses numbness and tingling of right hand that radiates to her right shoulder. She attributes this to right side mouth pain she has been having long-term. She has attempted tylenol  with no relief.   She weighs daily, reports weight at home 290.8 but in clinic is 300 lbs. She states she weighs every morning, without the boot in place. May contribute to weight differences.   Wears CPAP nightly, she has worn this consistently this year.   On Ozempic  0.5mg  will begin 1.0mg  dose in 2 weeks.   Has uterine fibroids with no current complications.   Overall, reports not feeling well due to discomfort in extremities.   Upcoming appointment with Pulmonology (K[facility]) 04/11/24  Past Medical History:  Diagnosis Date   (HFpEF) heart failure with preserved ejection fraction (HCC)    a. 04/2017 Echo: EF 55-60%, no rwma,  Mild MR, mildly dil LA. Nl RV fxn; b. 06/2019 Echo: EF 60-65%, mod LVH. Gr2 DD. Mildly dil LA; c. 03/2023 Cath: Nl cors. mild PAH (PA 31/17 (26) w/ high output (CO/CI 8.4/3.5); d. 08/2023 Echo: EF 55-60%, no rwma, GrI DD, mildly reduced RV fxn, RVSP 21.6 mmHg. No significant valvular dzs.   Arthritis    kness/hands - no meds   Diabetes mellitus without complication (HCC)    DM (diabetes mellitus) (HCC) 05/11/2017   Dyspnea on exertion    Headache(784.0)    otc med prn   Heart murmur    History of cardiac cath     a. 03/2023 Cath: Nl cors.   Hyperlipidemia    no meds since pregnancy   Hypertension    Morbid obesity (HCC)    NSVT (nonsustained ventricular tachycardia) (HCC)    a. 07/2022 Zio: 44 runs NSVT, longest 8 beats, fastest 182. 3 brief SVT runs.  21.3% PVC burden.   PVC (premature ventricular contraction)    a. 07/2022 Zio: 21.3 PVCs, 9.6% couplets, 3.5% triplets, 44 brief runs of NSVT.    Current Outpatient Medications  Medication Sig Dispense Refill   albuterol  (VENTOLIN  HFA) 108 (90 Base) MCG/ACT inhaler Inhale 1-2 puffs into the lungs every 6 (six) hours as needed. 18 g 0   aluminum-magnesium hydroxide 200-200 MG/5ML suspension Take 15 mLs by mouth every 6 (six) hours as needed for indigestion.     aspirin  EC 81 MG tablet Take 81 mg by mouth daily.     atorvastatin  (LIPITOR) 20 MG tablet TAKE 1 TABLET(20 MG) BY MOUTH DAILY 90 tablet 3   carvedilol  (COREG ) 25 MG tablet Take 0.5 tablets (12.5 mg total) by mouth 2 (two) times daily with a meal. 225 tablet 3   cetirizine (ZYRTEC) 10 MG tablet Take 10 mg by mouth daily.     empagliflozin  (JARDIANCE ) 25 MG TABS tablet Take 1 tablet (25 mg total) by mouth daily before breakfast. 30 tablet 6   famotidine  (PEPCID ) 20 MG tablet Take 1 tablet (20 mg total) by mouth 2 (two) times daily.     famotidine -calcium  carbonate-magnesium hydroxide (PEPCID  COMPLETE) 10-800-165 MG chewable tablet Chew 1 tablet by mouth daily as needed.     fluticasone  (FLONASE ) 50 MCG/ACT nasal spray Place 2 sprays into both nostrils daily.     latanoprost  (XALATAN ) 0.005 % ophthalmic solution Place 1 drop into both eyes at bedtime.     meclizine  (ANTIVERT ) 25 MG tablet Take 1 tablet (25 mg total) by mouth 3 (three) times daily as needed for dizziness or nausea. 30 tablet 1   mexiletine (MEXITIL ) 150 MG capsule Take 2 capsules (300 mg total) by mouth 2 (two) times daily. 360 capsule 3   pantoprazole  (PROTONIX ) 40 MG tablet Take 1 tablet (40 mg total) by mouth daily. 90 tablet 3    potassium chloride  20 MEQ/15ML (10%) SOLN Take 45 mLs (60 mEq total) by mouth daily. 473 mL 6   sacubitril -valsartan  (ENTRESTO ) 24-26 MG Take 1 tablet by mouth 2 (two) times daily.     Semaglutide ,0.25 or 0.5MG /DOS, (OZEMPIC , 0.25 OR 0.5 MG/DOSE,) 2 MG/3ML SOPN Inject 0.5 mg into the skin once a week. 3 mL 1   spironolactone  (ALDACTONE ) 25 MG tablet Take 2 tablets (50 mg total) by mouth daily.     torsemide  (DEMADEX ) 20 MG tablet Take 4 tablets (80 mg total) by mouth every morning AND 2 tablets (40 mg total) every evening.     No current facility-administered medications for  this visit.    Allergies  Allergen Reactions   Penicillins Anaphylaxis    Has patient had a PCN reaction causing immediate rash, facial/tongue/throat swelling, SOB or lightheadedness with hypotension: Yes Has patient had a PCN reaction causing severe rash involving mucus membranes or skin necrosis: No Has patient had a PCN reaction that required hospitalization: No Has patient had a PCN reaction occurring within the last 10 years: No If all of the above answers are NO, then may proceed with Cephalosporin use.      Social History   Socioeconomic History   Marital status: Single    Spouse name: Not on file   Number of children: 3   Years of education: 12   Highest education level: 12th grade  Occupational History   Not on file  Tobacco Use   Smoking status: Never   Smokeless tobacco: Never  Vaping Use   Vaping status: Never Used  Substance and Sexual Activity   Alcohol use: No   Drug use: No   Sexual activity: Yes    Birth control/protection: Surgical  Other Topics Concern   Not on file  Social History Narrative   Not on file   Social Drivers of Health   Financial Resource Strain: Medium Risk (08/13/2017)   Overall Financial Resource Strain (CARDIA)    Difficulty of Paying Living Expenses: Somewhat hard  Food Insecurity: Food Insecurity Present (08/22/2023)   Hunger Vital Sign    Worried About  Running Out of Food in the Last Year: Often true    Ran Out of Food in the Last Year: Sometimes true  Transportation Needs: No Transportation Needs (08/22/2023)   PRAPARE - Administrator, Civil Service (Medical): No    Lack of Transportation (Non-Medical): No  Physical Activity: Sufficiently Active (06/09/2019)   Exercise Vital Sign    Days of Exercise per Week: 5 days    Minutes of Exercise per Session: 150+ min  Stress: Stress Concern Present (08/13/2017)   [employer] of Occupational Health - Occupational Stress Questionnaire    Feeling of Stress : Rather much  Social Connections: Moderately Isolated (08/13/2017)   Social Connection and Isolation Panel    Frequency of Communication with Friends and Family: More than three times a week    Frequency of Social Gatherings with Friends and Family: Never    Attends Religious Services: Never    Database administrator or Organizations: No    Attends Banker Meetings: Never    Marital Status: Never married  Intimate Partner Violence: Not At Risk (08/22/2023)   Humiliation, Afraid, Rape, and Kick questionnaire    Fear of Current or Ex-Partner: No    Emotionally Abused: No    Physically Abused: No    Sexually Abused: No      Family History  Problem Relation Age of Onset   Diabetes Mother    Hypertension Mother    Hyperlipidemia Mother    Hypertension Father    Breast cancer Maternal Grandmother 50   Vitals:   04/08/24 1151 04/08/24 1152  BP: (!) 123/103 135/80  Pulse: 67 (!) 52  SpO2: 98% 98%  Weight: 136.1 kg    Wt Readings from Last 3 Encounters:  04/08/24 136.1 kg  01/30/24 132.9 kg  01/23/24 135.2 kg    Lab Results  Component Value Date   CREATININE 1.90 (H) 03/24/2024   CREATININE 2.11 (H) 03/03/2024   CREATININE 2.05 (H) 01/30/2024    PHYSICAL EXAM:  General: Well  appearing. No acute signs of distress.  HEENT: Normal Neck: Supple, no JVD Cor: Regular rhythm, rate. No rubs, gallops  or murmurs Lungs: Clear, symmetrical chest expansion.  Abdomen: Soft, nontender, nondistended. Extremities: No cyanosis, clubbing, rash, edema. No deformities noted.  Neuro: Alert & oriented X 3. Limited ROM left foot. Full ROM and strength in remaining extremity. Sensation of touch intact. Affect pleasant   ASSESSMENT & PLAN:   1. 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  - BNP today   2. HTN - BP 135/80 - BMET 03/24/24 reviewed: sodium 138, potassium 5.3, creatinine 1.90 & GFR 32 - BMET today - Saw nephrology (Kolluru) 05/25, upcoming appointment 06/2024   3. PVC's, frequent - Zio 10/21 12.5% PVCs Saw EP Marny) and suggested possible AAD (flecainide or propafenone) if developed symptoms or LV dysfunction.  - Zio patch 1/24. Sinus No AF. 44 runs NSVT (longest 8 beats). 21.3% PVCs (3 morphologies 9.7%, 7.3%, 4.3%) - suspect driven by OSA but could aslo be infiltrative process (? Sarcoid) -> cMRI has been denied even after appeal - Saw Dr. Cindie 3/24 felt not to be ablation candidate due to obesity. Recommended continue carvedilol  as EF stable - PVCs not improved with CPAP - Continue mexilitene 300 bid   4. Possible AF  - Noted on sleep study and AppleWatch - No AF on Zio -> suspect this is likely PVCs - Saw EP (Riddle) 05/25  5.  OSA, severe - Sleep study 07/24/22: AHI 35.4 Sats down to 75%.  - CPAP nightly   6. Morbid obesity - Continue GLP1, begins 1.0 dose in 2 weeks  - Completed pulmonary rehab in July  Return in 1 month, sooner if needed.   [facility] FNP-C / Solomon Jabreel Chimento, FNP-S 04/08/24

## 2024-04-08 ENCOUNTER — Ambulatory Visit (HOSPITAL_BASED_OUTPATIENT_CLINIC_OR_DEPARTMENT_OTHER): Admitting: Family

## 2024-04-08 ENCOUNTER — Other Ambulatory Visit
Admission: RE | Admit: 2024-04-08 | Discharge: 2024-04-08 | Disposition: A | Discharge status: Home / Self Care | Source: Ambulatory Visit | Attending: Family | Admitting: Family

## 2024-04-08 ENCOUNTER — Encounter: Payer: Self-pay | Admitting: Family

## 2024-04-08 ENCOUNTER — Ambulatory Visit: Payer: Self-pay | Admitting: Family

## 2024-04-08 VITALS — BP 135/80 | HR 52 | Wt 300.0 lb

## 2024-04-08 DIAGNOSIS — I10 Essential (primary) hypertension: Secondary | ICD-10-CM

## 2024-04-08 DIAGNOSIS — I493 Ventricular premature depolarization: Secondary | ICD-10-CM

## 2024-04-08 DIAGNOSIS — G4733 Obstructive sleep apnea (adult) (pediatric): Secondary | ICD-10-CM

## 2024-04-08 DIAGNOSIS — I5032 Chronic diastolic (congestive) heart failure: Secondary | ICD-10-CM

## 2024-04-08 LAB — BASIC METABOLIC PANEL WITH GFR
Anion gap: 10 (ref 5–15)
BUN: 32 mg/dL — ABNORMAL HIGH (ref 6–20)
CO2: 26 mmol/L (ref 22–32)
Calcium: 9 mg/dL (ref 8.9–10.3)
Chloride: 101 mmol/L (ref 98–111)
Creatinine, Ser: 1.96 mg/dL — ABNORMAL HIGH (ref 0.44–1.00)
GFR, Estimated: 31 mL/min — ABNORMAL LOW (ref >60)
Glucose, Bld: 133 mg/dL — ABNORMAL HIGH (ref 70–99)
Potassium: 3.6 mmol/L (ref 3.5–5.1)
Sodium: 137 mmol/L (ref 135–145)

## 2024-04-08 NOTE — Patient Instructions (Signed)
 Lab Work: Labs done today, your results will be available in MyChart, we will contact you for abnormal readings.    Follow Up: 1 month with [facility]

## 2024-04-09 LAB — MISC LABCORP TEST (SEND OUT): Labcorp test code: 14300

## 2024-04-11 ENCOUNTER — Encounter: Payer: Self-pay | Admitting: Internal Medicine

## 2024-04-11 ENCOUNTER — Ambulatory Visit: Admitting: Internal Medicine

## 2024-04-11 VITALS — BP 140/60 | HR 62 | Temp 98.0°F | Ht 66.5 in | Wt 296.8 lb

## 2024-04-11 DIAGNOSIS — G4733 Obstructive sleep apnea (adult) (pediatric): Secondary | ICD-10-CM

## 2024-04-11 DIAGNOSIS — Z9989 Dependence on other enabling machines and devices: Secondary | ICD-10-CM | POA: Diagnosis not present

## 2024-04-11 DIAGNOSIS — R5381 Other malaise: Secondary | ICD-10-CM | POA: Diagnosis not present

## 2024-04-11 DIAGNOSIS — E669 Obesity, unspecified: Secondary | ICD-10-CM | POA: Diagnosis not present

## 2024-04-11 DIAGNOSIS — Z6841 Body Mass Index (BMI) 40.0 and over, adult: Secondary | ICD-10-CM

## 2024-04-11 NOTE — Patient Instructions (Signed)

## 2024-04-11 NOTE — Progress Notes (Signed)
 Name: Heather Norman MRN: [id number] DOB: 09/30/1975    CHIEF COMPLAINT:  Follow-up assessment for severe sleep apnea AHI 35   HISTORY OF PRESENT ILLNESS: Follow up OSA assessment HST performed shows AHI 35   Discussed sleep data and reviewed with patient.  Encouraged proper weight management.  Discussed driving precautions and its relationship with hypersomnolence.  Discussed sleep hygiene, and benefits of a fixed sleep waked time.  The importance of getting eight or more hours of sleep discussed with patient.  Discussed limiting the use of the computer and television before bedtime.  Decrease naps during the day, so night time sleep will become enhanced.  Limit caffeine, and sleep deprivation.   Patient uses and benefits from therapy Using CPAP nightly and with naps Pressure setting is comfortable and is sleeping well. CPAP 13 AHI reduced to 0.4 Excellent compliance report and 100%   No exacerbation at this time No evidence of heart failure at this time No evidence or signs of infection at this time No respiratory distress No fevers, chills, nausea, vomiting, diarrhea No evidence of lower extremity edema No evidence hemoptysis  PAST MEDICAL HISTORY :   has a past medical history of (HFpEF) heart failure with preserved ejection fraction (HCC), Arthritis, Diabetes mellitus without complication (HCC), DM (diabetes mellitus) (HCC) (05/11/2017), Dyspnea on exertion, Headache(784.0), Heart murmur, History of cardiac cath, Hyperlipidemia, Hypertension, Morbid obesity (HCC), NSVT (nonsustained ventricular tachycardia) (HCC), and PVC (premature ventricular contraction).  has a past surgical history that includes Cervical cerclage (06/01/2011); svd; endosocpy; tab; Cervical cerclage (06/01/2011); Tubal ligation; RIGHT/LEFT HEART CATH AND CORONARY ANGIOGRAPHY (N/A, 03/28/2023); Breast biopsy (Right, 11/12/2023); and Breast biopsy (Right, 11/12/2023). Prior to Admission medications    Medication Sig Start Date End Date Taking? Authorizing Provider  albuterol  (VENTOLIN  HFA) 108 (90 Base) MCG/ACT inhaler Inhale 1-2 puffs into the lungs every 6 (six) hours as needed. 07/20/22  Yes Corlis Burnard DEL, NP  aluminum-magnesium hydroxide 200-200 MG/5ML suspension Take 15 mLs by mouth every 6 (six) hours as needed for indigestion.   Yes [provider]  aspirin  EC 81 MG tablet Take 81 mg by mouth daily.   Yes [provider]  atorvastatin  (LIPITOR) 20 MG tablet TAKE 1 TABLET(20 MG) BY MOUTH DAILY 12/26/22  Yes Donette City A, FNP  carvedilol  (COREG ) 25 MG tablet Take 0.5 tablets (12.5 mg total) by mouth 2 (two) times daily with a meal. 10/08/23  Yes Bensimhon, Toribio SAUNDERS, MD  cetirizine (ZYRTEC) 10 MG tablet Take 10 mg by mouth daily. 08/21/23  Yes [provider]  empagliflozin  (JARDIANCE ) 25 MG TABS tablet Take 1 tablet (25 mg total) by mouth daily before breakfast. 01/25/23  Yes Hackney, Tina A, FNP  famotidine  (PEPCID ) 20 MG tablet Take 1 tablet (20 mg total) by mouth 2 (two) times daily. 02/20/23  Yes [place], PA-C  famotidine -calcium  carbonate-magnesium hydroxide (PEPCID  COMPLETE) 10-800-165 MG chewable tablet Chew 1 tablet by mouth daily as needed.   Yes [provider]  fluticasone  (FLONASE ) 50 MCG/ACT nasal spray Place 2 sprays into both nostrils daily. 08/21/23  Yes [provider]  latanoprost  (XALATAN ) 0.005 % ophthalmic solution Place 1 drop into both eyes at bedtime. 01/17/22  Yes [provider]  meclizine  (ANTIVERT ) 25 MG tablet Take 1 tablet (25 mg total) by mouth 3 (three) times daily as needed for dizziness or nausea. 08/23/22  Yes Viviann Pastor, MD  metolazone  (ZAROXOLYN ) 2.5 MG tablet Take 1 tablet (2.5 mg total) by mouth once a week.  08/24/23  Yes Sreenath, Sudheer B, MD  mexiletine (MEXITIL ) 150 MG capsule Take 2 capsules (300 mg total) by mouth 2 (two) times daily. 03/26/23  Yes Bensimhon, Toribio SAUNDERS, MD  pantoprazole   (PROTONIX ) 40 MG tablet Take 1 tablet (40 mg total) by mouth daily. 02/20/23 02/15/24 Yes [place], PA-C  potassium chloride  20 MEQ/15ML (10%) SOLN Take 45 mLs (60 mEq total) by mouth daily. And additional 60meq on the days you take the metolazone  03/27/23  Yes Milford, Harlene HERO, FNP  sacubitril -valsartan  (ENTRESTO ) 49-51 MG Take 1 tablet by mouth 2 (two) times daily. 10/08/23  Yes Bensimhon, Toribio SAUNDERS, MD  Semaglutide ,0.25 or 0.5MG /DOS, 2 MG/3ML SOPN Inject 0.25 mg into the skin once a week. 11/06/22  Yes Hackney, City A, FNP  spironolactone  (ALDACTONE ) 25 MG tablet TAKE 1 TABLET(25 MG) BY MOUTH DAILY 04/13/22  Yes Gollan, Timothy J, MD  torsemide  (DEMADEX ) 20 MG tablet Take 4 tablets (80 mg total) by mouth daily. Patient taking differently: Take 40 mg by mouth 2 (two) times daily. Take 60MG  in the morning and 40 MG at night 03/05/23  Yes [place]ford, Harlene HERO, [place]   Allergies  Allergen Reactions   Penicillins Anaphylaxis    Has patient had a PCN reaction causing immediate rash, facial/tongue/throat swelling, SOB or lightheadedness with hypotension: Yes Has patient had a PCN reaction causing severe rash involving mucus membranes or skin necrosis: No Has patient had a PCN reaction that required hospitalization: No Has patient had a PCN reaction occurring within the last 10 years: No If all of the above answers are NO, then may proceed with Cephalosporin use.    FAMILY HISTORY:  family history includes Breast cancer (age of onset: 51) in her maternal grandmother; Diabetes in her mother; Hyperlipidemia in her mother; Hypertension in her father and mother. SOCIAL HISTORY:  reports that she has never smoked. She has never used smokeless tobacco. She reports that she does not drink alcohol and does not use drugs.    BP (!) 140/60   Pulse 62   Temp 98 F (36.7 C)   Ht 5' 6.5 (1.689 m)   Wt 296 lb 12.8 oz (134.6 kg)   SpO2 98%   BMI 47.19 kg/m     Physical Examination:   General  Appearance: No distress  EYES PERRLA, EOM intact.   NECK Supple, No JVD Pulmonary: normal breath sounds, No wheezing.  CardiovascularNormal S1,S2.  No m/r/g.   Abdomen: Benign, Soft, non-tender. Neurology UE/LE 5/5 strength, no focal deficits Ext pulses intact, cap refill intact ALL OTHER ROS ARE NEGATIVE     ASSESSMENT AND PLAN SYNOPSIS 48 year old morbidly obese African-American female with severe sleep apnea and deconditioned state   Assessment of OSA Previous AHI 35 Continue CPAP as prescribed  Excellent compliance report Reviewed compliance report in detail with patient Patient definitely benefits the use of CPAP therapy as prescribed Using CPAP nightly and with naps Pressure setting is comfortable and is sleeping well. CPAP prescription 13 AHI reduced to 0.4  No evidence of acute heart failure at this time No respiratory distress No fevers, chills, nausea, vomiting, diarrhea No evidence hemoptysis  Patient Instructions Continue to use CPAP every night, minimum of 4-6 hours a night.  Change equipment every 30 days or as directed by DME.  Wash your tubing with warm soap and water daily, hang to dry. Wash humidifier portion weekly. Use bottled, distilled water and change daily   Be aware of reduced alertness and do not drive or operate  heavy machinery if experiencing this or drowsiness.  Exercise encouraged, as tolerated. Encouraged proper weight management.  Important to get eight or more hours of sleep  Limiting the use of the computer and television before bedtime.  Decrease naps during the day, so night time sleep will become enhanced.  Limit caffeine, and sleep deprivation.  HTN, stroke, uncontrolled diabetes and heart failure are potential risk factors.  Risk of untreated sleep apnea including cardiac arrhthymias, stroke, DM, pulm HTN.   Obesity -recommend significant weight loss -recommend changing diet  Deconditioned state -Recommend increased daily  activity and exercise   MEDICATION ADJUSTMENTS/LABS AND TESTS ORDERED: Continue CPAP as prescribed Avoid Allergens and Irritants Avoid secondhand smoke Avoid SICK contacts Recommend  Masking  when appropriate Recommend Keep up-to-date with vaccinations   CURRENT MEDICATIONS REVIEWED AT LENGTH WITH PATIENT TODAY   Patient  satisfied with Plan of action and management. All questions answered   Follow up 1 year   I spent a total of 40 minutes dedicated to the care of this patient on the date of this encounter to include pre-visit review of records, face-to-face time with the patient discussing conditions above, post visit ordering of testing, clinical documentation with the electronic health record, making appropriate referrals as documented, and communicating necessary information to the patient's healthcare team.    The Patient requires high complexity decision making for assessment and support, frequent evaluation and titration of therapies, application of advanced monitoring technologies and extensive interpretation of multiple databases.  Patient satisfied with Plan of action and management. All questions answered    Nickolas Alm Cellar, M.D.  [facility]  Medical Director [facility] Medical Director [facility] Cardio-Pulmonary Department

## 2024-05-07 NOTE — Progress Notes (Deleted)
 ADVANCED HF CLINIC NOTE   Primary Care: Osa Geralds, NP Primary Cardiologist: Evalene Lunger, MD  Chief Complaint: shortness of breath   HPI:  [name] is a 48 y/o female with a history of arthritis, DM, hyperlipidemia, HTN, morbid obesity and chronic diastolic heart failure.   Zio 10/21 12.5% PVCs Saw EP Marny) and suggested possible AAD (flecainide or propafenone) if developed symptoms or LV dysfunction.   Echo 02/10/21 EF 50-55% G1DD   Echo 01/26/22 EF 55-60% mild LVH and trivial MR.  Sleep study 07/24/22: AHI 35.4 Sats down to 75%.  Possible AF during study lasting 4.5 hours. Zio placed    Zio patch 1/24  - Sinus rhythm. No AF - 44 runs NSVT (longest 8 beats)  - 21.3% PVCs (3 morphologies 9.7%, 7.3%, 4.3%)  Saw Dr. Cindie in 3/24. felt not to be ablation candidate due to obesity. Recommended continue carvedilol  as EF stable  Started on CPAP in 2024. Wore CPAP for awhile but then got bronchitis.   Seen in ER 10/30/22 for CP. Hstrop, ECG and d-Dimer all normal. BNP elevated 47 -> 480 so torsemide  increased.  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   Seen in ED on 08/15/23 for volume overload. Got IV lasix . Increased metolazone  for 1 to 2 x/week. Unfortunately developed low BP and AKI (Scr 1.5 -> 2.5) and admitted 08/22/23. Given IVF.   Echo 1/25 EF 60-65% G1DD  RV mildly down.   Seen in Urgent Care 06/25 and diagnosed with pneumonia.   Seen in [facility] 06/25 where torsemide  was increased  to 80/60mg  due to being fluid up. Entresto  was decreased to 24/26mg  BID due to hypotension.   She presents today for a HF follow-up visit with a chief complaint of shortness of breath. She states her symptoms have improved since her last visit 01/2024. She endorses palpitations due to her PVCs, which is her baseline. Denies chest pain, lightheadedness, edema, cough or difficulty sleeping. Has tolerated the increase  in torsemide  without any issues that she's aware of. No further respiratory complications endorsed.   In August attended a work wellness clinic, A1C was 11.2%. On 8/25 was started on Lantus 10 units every night. Reports stable glucose levels.   ED visit around labor day due to numbness and pain on ambulation on left foot. Patient presents today with foot boot in place and was prescribed prednisone  as needed for inflammation. When at home she uses a foot arch. She endorses no limitations to physical activity. Questionable for arthritis vs peripheral neuropathy.   Today she further endorses numbness and tingling of right hand that radiates to her right shoulder. She attributes this to right side mouth pain she has been having long-term. She has attempted tylenol  with no relief.   She weighs daily, reports weight at home 290.8 but in clinic is 300 lbs. She states she weighs every morning, without the boot in place. May contribute to weight differences.   Wears CPAP nightly, she has worn this consistently this year.   On Ozempic  0.5mg  will begin 1.0mg  dose in 2 weeks.   Has uterine fibroids with no current complications.   Overall, reports not feeling well due to discomfort in extremities.   Upcoming appointment with Pulmonology (Kasa) 04/11/24  Past Medical History:  Diagnosis Date   (HFpEF) heart failure with preserved ejection fraction (HCC)    a. 04/2017 Echo: EF 55-60%, no rwma,  Mild MR, mildly dil LA. Nl RV fxn; b. 06/2019 Echo: EF 60-65%, mod LVH. Gr2 DD. Mildly dil LA; c. 03/2023 Cath: Nl cors. mild PAH (PA 31/17 (26) w/ high output (CO/CI 8.4/3.5); d. 08/2023 Echo: EF 55-60%, no rwma, GrI DD, mildly reduced RV fxn, RVSP 21.6 mmHg. No significant valvular dzs.   Arthritis    kness/hands - no meds   Diabetes mellitus without complication (HCC)    DM (diabetes mellitus) (HCC) 05/11/2017   Dyspnea on exertion    Headache(784.0)    otc med prn   Heart murmur    History of cardiac cath     a. 03/2023 Cath: Nl cors.   Hyperlipidemia    no meds since pregnancy   Hypertension    Morbid obesity (HCC)    NSVT (nonsustained ventricular tachycardia) (HCC)    a. 07/2022 Zio: 44 runs NSVT, longest 8 beats, fastest 182. 3 brief SVT runs.  21.3% PVC burden.   PVC (premature ventricular contraction)    a. 07/2022 Zio: 21.3 PVCs, 9.6% couplets, 3.5% triplets, 44 brief runs of NSVT.    Current Outpatient Medications  Medication Sig Dispense Refill   Accu-Chek Softclix Lancets lancets USE 1 AS DIRECTED FOR BLOOD GLUCOSE MONITORING     albuterol  (VENTOLIN  HFA) 108 (90 Base) MCG/ACT inhaler Inhale 1-2 puffs into the lungs every 6 (six) hours as needed. 18 g 0   aluminum-magnesium hydroxide 200-200 MG/5ML suspension Take 15 mLs by mouth every 6 (six) hours as needed for indigestion.     aspirin  EC 81 MG tablet Take 81 mg by mouth daily.     Aspirin -Calcium  Carbonate 81-777 MG TABS Take by mouth.     atorvastatin  (LIPITOR) 20 MG tablet TAKE 1 TABLET(20 MG) BY MOUTH DAILY 90 tablet 3   AUM SAFETY PEN NEEDLE 31G X 4 MM MISC SMARTSIG:1 pen needle SUB-Q Every Evening     Blood Glucose Monitoring Suppl ([employer] BLOOD GLUCOSE METER) w/Device KIT USE AS DIRECTED ONCE A DAILY TO CHECK BLOOD GLUCOSE     carvedilol  (COREG ) 25 MG tablet Take 0.5 tablets (12.5 mg total) by mouth 2 (two) times daily with a meal. 225 tablet 3   cetirizine (ZYRTEC) 10 MG tablet Take 10 mg by mouth daily.     EMBECTA PEN NEEDLE NANO 2 GEN 32G X 4 MM MISC SMARTSIG:1 pen needle SUB-Q Every Evening     empagliflozin  (JARDIANCE ) 25 MG TABS tablet Take 1 tablet (25 mg total) by mouth daily before breakfast. 30 tablet 6   famotidine  (PEPCID ) 20 MG tablet Take 1 tablet (20 mg total) by mouth 2 (two) times daily.     famotidine -calcium  carbonate-magnesium hydroxide (PEPCID  COMPLETE) 10-800-165 MG chewable tablet Chew 1 tablet by mouth daily as needed.     fluticasone  (FLONASE ) 50 MCG/ACT nasal spray Place 2 sprays into both nostrils  daily.     glucose blood test strip USE ONE DAILY TO CHECK BLOOD SUGAR     insulin  glargine (LANTUS) 100 UNIT/ML injection Inject 10 Units into the skin at bedtime.     LANTUS SOLOSTAR 100 UNIT/ML Solostar Pen Inject into the skin daily.     latanoprost  (XALATAN ) 0.005 % ophthalmic solution Place 1 drop into both eyes at bedtime.     meclizine  (ANTIVERT ) 25 MG tablet Take 1 tablet (25 mg total) by mouth 3 (three) times daily as needed for dizziness or nausea. 30 tablet 1   mexiletine (MEXITIL ) 150 MG capsule Take 2 capsules (300 mg total) by mouth  2 (two) times daily. 360 capsule 3   oxybutynin  (DITROPAN ) 5 MG tablet Take 5 mg by mouth 2 (two) times daily.     OZEMPIC , 2 MG/DOSE, 8 MG/3ML SOPN Inject 2 mg into the skin once a week.     pantoprazole  (PROTONIX ) 40 MG tablet Take 1 tablet (40 mg total) by mouth daily. 90 tablet 3   potassium chloride  20 MEQ/15ML (10%) SOLN Take 45 mLs (60 mEq total) by mouth daily. 473 mL 6   potassium chloride  SA (KLOR-CON  M) 20 MEQ tablet Take 20 mEq by mouth 2 (two) times daily.     sacubitril -valsartan  (ENTRESTO ) 49-51 MG Take 1 tablet by mouth 2 (two) times daily.     spironolactone  (ALDACTONE ) 25 MG tablet Take 2 tablets (50 mg total) by mouth daily.     torsemide  (DEMADEX ) 20 MG tablet Take 4 tablets (80 mg total) by mouth every morning AND 2 tablets (40 mg total) every evening.     No current facility-administered medications for this visit.    Allergies  Allergen Reactions   Penicillins Anaphylaxis    Has patient had a PCN reaction causing immediate rash, facial/tongue/throat swelling, SOB or lightheadedness with hypotension: Yes Has patient had a PCN reaction causing severe rash involving mucus membranes or skin necrosis: No Has patient had a PCN reaction that required hospitalization: No Has patient had a PCN reaction occurring within the last 10 years: No If all of the above answers are NO, then may proceed with Cephalosporin use.      Social  History   Socioeconomic History   Marital status: Single    Spouse name: Not on file   Number of children: 3   Years of education: 12   Highest education level: 12th grade  Occupational History   Not on file  Tobacco Use   Smoking status: Never   Smokeless tobacco: Never  Vaping Use   Vaping status: Never Used  Substance and Sexual Activity   Alcohol use: No   Drug use: No   Sexual activity: Yes    Birth control/protection: Surgical  Other Topics Concern   Not on file  Social History Narrative   Not on file   Social Drivers of Health   Financial Resource Strain: Medium Risk (08/13/2017)   Overall Financial Resource Strain (CARDIA)    Difficulty of Paying Living Expenses: Somewhat hard  Food Insecurity: Food Insecurity Present (08/22/2023)   Hunger Vital Sign    Worried About Running Out of Food in the Last Year: Often true    Ran Out of Food in the Last Year: Sometimes true  Transportation Needs: No Transportation Needs (08/22/2023)   PRAPARE - Administrator, Civil Service (Medical): No    Lack of Transportation (Non-Medical): No  Physical Activity: Sufficiently Active (06/09/2019)   Exercise Vital Sign    Days of Exercise per Week: 5 days    Minutes of Exercise per Session: 150+ min  Stress: Stress Concern Present (08/13/2017)   Heather Norman [employer] - Occupational Stress Questionnaire    Feeling of Stress : Rather much  Social Connections: Moderately Isolated (08/13/2017)   Social Connection and Isolation Panel    Frequency of Communication with Friends and Family: More than three times a week    Frequency of Social Gatherings with Friends and Family: Never    Attends Religious Services: Never    Database administrator or Organizations: No    Attends Banker Meetings: Never  Marital Status: Never married  Intimate Partner Violence: Not At Risk (08/22/2023)   Humiliation, Afraid, Rape, and Kick questionnaire    Fear of  Current or Ex-Partner: No    Emotionally Abused: No    Physically Abused: No    Sexually Abused: No      Family History  Problem Relation Age of Onset   Diabetes Mother    Hypertension Mother    Hyperlipidemia Mother    Hypertension Father    Breast cancer Maternal Grandmother 50   There were no vitals filed for this visit.  Wt Readings from Last 3 Encounters:  04/11/24 296 lb 12.8 oz (134.6 kg)  04/08/24 300 lb (136.1 kg)  01/30/24 293 lb (132.9 kg)    Lab Results  Component Value Date   CREATININE 1.96 (H) 04/08/2024   CREATININE 1.90 (H) 03/24/2024   CREATININE 2.11 (H) 03/03/2024    PHYSICAL EXAM:  General: Well appearing. No acute signs of distress.  HEENT: Normal Neck: Supple, no JVD Cor: Regular rhythm, rate. No rubs, gallops or murmurs Lungs: Clear, symmetrical chest expansion.  Abdomen: Soft, nontender, nondistended. Extremities: No cyanosis, clubbing, rash, edema. No deformities noted.  Neuro: Alert & oriented X 3. Limited ROM left foot. Full ROM and strength in remaining extremity. Sensation of touch intact. Affect pleasant   ASSESSMENT & PLAN:   1. 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  - BNP today   2. HTN - BP 135/80 - BMET 03/24/24 reviewed: sodium 138, potassium 5.3, creatinine 1.90 & GFR 32 -  BMET today - Saw nephrology (Kolluru) 05/25, upcoming appointment 06/2024   3. PVC's, frequent - Zio 10/21 12.5% PVCs Saw EP Marny) and suggested possible AAD (flecainide or propafenone) if developed symptoms or LV dysfunction.  - Zio patch 1/24. Sinus No AF. 44 runs NSVT (longest 8 beats). 21.3% PVCs (3 morphologies 9.7%, 7.3%, 4.3%) - suspect driven by OSA but could aslo be infiltrative process (? Sarcoid) -> cMRI has been denied even after appeal - Saw Dr. Cindie 3/24 felt not to be ablation candidate due to obesity. Recommended continue carvedilol  as EF stable - PVCs not improved with CPAP - Continue mexilitene 300 bid   4. Possible AF  - Noted on sleep study and AppleWatch - No AF on Zio -> suspect this is likely PVCs - Saw EP (Riddle) 05/25  5. OSA, severe - Sleep study 07/24/22: AHI 35.4 Sats down to 75%.  - CPAP nightly   6. Morbid obesity - Continue GLP1, begins 1.0 dose in 2 weeks  - Completed pulmonary rehab in July  Return in 1 month, sooner if needed.   [facility] FNP-C / Solomon Morris, FNP-S 04/08/24

## 2024-05-08 ENCOUNTER — Telehealth: Payer: Self-pay | Admitting: Family

## 2024-05-08 ENCOUNTER — Encounter: Admitting: Family

## 2024-05-08 NOTE — Telephone Encounter (Signed)
 Patient did not show for her Heart Failure Clinic appointment on 05/08/24.

## 2024-05-13 MED ORDER — CARVEDILOL 25 MG PO TABS: 25.0000 mg | ORAL_TABLET | Freq: Two times a day (BID) | ORAL | 3 refills | Status: AC

## 2024-07-07 ENCOUNTER — Telehealth: Payer: Self-pay

## 2024-07-07 ENCOUNTER — Telehealth: Payer: Self-pay | Admitting: Family

## 2024-07-07 MED ORDER — MEXILETINE HCL 150 MG PO CAPS: 300.0000 mg | ORAL_CAPSULE | Freq: Two times a day (BID) | ORAL | 3 refills | Status: AC

## 2024-07-07 NOTE — Telephone Encounter (Signed)
 Copied from CRM [id number]. Topic: Clinical - Medical Advice >> Jul 07, 2024  9:41 AM Heather Norman wrote: Reason for CRM: Patient had dental work/surgery at the end of September.  It took Norman month for her to heal.  She couldn't use her CPAP during the time after her surgery until her mouth healed.  Once the healing took place, she went back to using the CPAP but for some reason she couldn't breathe and she threw up when using it.  She wants to know what she should do.  She may need Norman mask adjustment.  Please return her call at [phone].

## 2024-07-07 NOTE — Telephone Encounter (Signed)
 Mexiletine refilled per pt request.

## 2024-07-23 ENCOUNTER — Ambulatory Visit
Admission: RE | Admit: 2024-07-23 | Discharge: 2024-07-23 | Disposition: A | Discharge status: Home / Self Care | Payer: Self-pay | Source: Ambulatory Visit | Attending: Emergency Medicine | Admitting: Emergency Medicine

## 2024-07-23 VITALS — BP 105/56 | HR 84 | Temp 98.1°F | Resp 20

## 2024-07-23 DIAGNOSIS — B349 Viral infection, unspecified: Secondary | ICD-10-CM

## 2024-07-23 LAB — POCT INFLUENZA A/B
Influenza A, POC: NEGATIVE
Influenza B, POC: NEGATIVE

## 2024-07-23 LAB — POC SOFIA SARS ANTIGEN FIA: SARS Coronavirus 2 Ag: NEGATIVE

## 2024-07-23 MED ORDER — GUAIFENESIN-CODEINE 100-10 MG/5ML PO SOLN: 5.0000 mL | Freq: Four times a day (QID) | ORAL | 0 refills | Status: AC | PRN

## 2024-07-23 MED ORDER — PREDNISONE 10 MG (21) PO TBPK: ORAL_TABLET | Freq: Every day | ORAL | 0 refills | Status: DC

## 2024-07-23 NOTE — ED Triage Notes (Signed)
 Patient reports cough, sneezing, body aches and wheezing x 3 days. Patient reports sore throat on Sunday that has resolved and reports wheezing resolved yesterday after using inhaler. Rates pain 4/10.

## 2024-07-23 NOTE — Discharge Instructions (Addendum)
 Your symptoms today are most likely being caused by a virus and should steadily improve in time it can take up to 7 to 10 days before you truly start to see a turnaround however things will get better  Covid and flu negative   As you have already started you may finish antibiotic course as this will be more of a protective measure for the lungs  Begin prednisone  every morning with food to open and relax airway helping to further reduce wheezing, will temporarily increase your blood sugar, monitor closely  May use cough syrup every 6 hours but please be mindful to make a dry    You can take Tylenol  and/or Ibuprofen as needed for fever reduction and pain relief.   For cough: honey 1/2 to 1 teaspoon (you can dilute the honey in water or another fluid).  You can also use guaifenesin  and dextromethorphan for cough. You can use a humidifier for chest congestion and cough.  If you don't have a humidifier, you can sit in the bathroom with the hot shower running.      For sore throat: try warm salt water gargles, cepacol lozenges, throat spray, warm tea or water with lemon/honey, popsicles or ice, or OTC cold relief medicine for throat discomfort.   For congestion: take a daily anti-histamine like Zyrtec, Claritin , and a oral decongestant, such as pseudoephedrine.  You can also use Flonase  1-2 sprays in each nostril daily.   It is important to stay hydrated: drink plenty of fluids (water, gatorade/powerade/pedialyte, juices, or teas) to keep your throat moisturized and help further relieve irritation/discomfort.

## 2024-07-23 NOTE — ED Provider Notes (Signed)
 " [name]    CSN: [id number] Arrival date & time: 07/23/24  1031      History   Chief Complaint Chief Complaint  Patient presents with   Cough    Cough, sneezing, body aches. Wheezing. No fever - Entered by patient   Generalized Body Aches    HPI Heather Norman is a 48 y.o. female.    Patient presents for evaluation of nasal congestion, sneezing, sore throat, and a nonproductive cough with wheezing present for 3 days.  Wheezing has resolved with use of inhaler.  Having coughing episodes with shortness of breath and lightheadedness after.  Has attempted use of Flonase  and Tylenol , has begun an old prescription of azithromycin .  Known sick contact with similar symptoms.  Tolerable to food and liquids.  Denies fever.     Past Medical History:  Diagnosis Date   (HFpEF) heart failure with preserved ejection fraction (HCC)    a. 04/2017 Echo: EF 55-60%, no rwma, Mild MR, mildly dil LA. Nl RV fxn; b. 06/2019 Echo: EF 60-65%, mod LVH. Gr2 DD. Mildly dil LA; c. 03/2023 Cath: Nl cors. mild PAH (PA 31/17 (26) w/ high output (CO/CI 8.4/3.5); d. 08/2023 Echo: EF 55-60%, no rwma, GrI DD, mildly reduced RV fxn, RVSP 21.6 mmHg. No significant valvular dzs.   Arthritis    kness/hands - no meds   Diabetes mellitus without complication (HCC)    DM (diabetes mellitus) (HCC) 05/11/2017   Dyspnea on exertion    Headache(784.0)    otc med prn   Heart murmur    History of cardiac cath    a. 03/2023 Cath: Nl cors.   Hyperlipidemia    no meds since pregnancy   Hypertension    Morbid obesity (HCC)    NSVT (nonsustained ventricular tachycardia) (HCC)    a. 07/2022 Zio: 44 runs NSVT, longest 8 beats, fastest 182. 3 brief SVT runs.  21.3% PVC burden.   PVC (premature ventricular contraction)    a. 07/2022 [facility]: 21.3 PVCs, 9.6% couplets, 3.5% triplets, 44 brief runs of NSVT.    Patient Active Problem List   Diagnosis Date Noted   PCOS (polycystic ovarian syndrome) 11/07/2023    Intramural leiomyoma of uterus 11/07/2023   Menorrhagia with irregular cycle 11/07/2023   AKI (acute kidney injury) 08/22/2023   Benign hypertensive kidney disease with chronic kidney disease 04/12/2023   Chronic kidney disease, stage 3b (HCC) 04/12/2023   Hyperlipidemia 04/12/2023   Type 2 diabetes mellitus with diabetic chronic kidney disease (HCC) 04/12/2023   OSA (obstructive sleep apnea) 10/25/2022   Numbness and tingling 06/20/2021   Difficulty sleeping 06/20/2021   Blurred vision 06/20/2021   Type 2 diabetes mellitus without complication, without long-term current use of insulin  (HCC) 05/19/2018   Headache 09/12/2017   Psychological and behavioral factors associated with disorders or diseases classified elsewhere 05/28/2017   DM (diabetes mellitus) (HCC) 05/11/2017   Snoring 05/11/2017   Shortness of breath 04/25/2017   Morbid obesity (HCC) 08/30/2015   Chronic diastolic CHF (congestive heart failure) (HCC) 08/30/2015   Loose stools 06/07/2015   Adjustment disorder with depressed mood 03/11/2015   Encounter for screening for other disorder 03/11/2015   Essential hypertension 02/16/2015   Abnormal levels of other serum enzymes 12/17/2014   Encounter for surveillance of injectable contraceptive 07/28/2014   Other allergic rhinitis 07/28/2014   Hypokalemia 03/18/2014   Urinary tract infection 03/18/2014   Abnormal finding in urine 03/11/2014   Urinary incontinence 03/11/2014   Left ventricular  hypertrophy 02/16/2014   Congestive heart failure (HCC) 11/11/2013   Elderly multigravida with antepartum condition or complication 05/22/2011    Past Surgical History:  Procedure Laterality Date   BREAST BIOPSY Right 11/12/2023   us  bx, 10:00 8 cmfn, ribbon marker, path pending   BREAST BIOPSY Right 11/12/2023   US  RT BREAST BX W LOC DEV 1ST LESION IMG BX SPEC US  GUIDE 11/12/2023 [facility]   CERVICAL CERCLAGE  06/01/2011   Procedure: CERCLAGE CERVICAL;  Surgeon: Donna Just, DO;  Location: [facility];  Service: Gynecology;  Laterality: N/A;  REQUESTING PORTABLE ULTARSOUND AT BEDSIDE PT'S EDC:12/02/2011   CERVICAL CERCLAGE  06/01/2011   Procedure: CERCLAGE CERVICAL;  Surgeon: Donna Just, DO;  Location: [facility];  Service: Gynecology;  Laterality: N/A;  REQUESTING PORTABLE ULTARSOUND AT BEDSIDE PT'S EDC:12/02/2011   endosocpy     RIGHT/LEFT HEART CATH AND CORONARY ANGIOGRAPHY N/A 03/28/2023   Procedure: RIGHT/LEFT HEART CATH AND CORONARY ANGIOGRAPHY;  Surgeon: Cherrie Toribio SAUNDERS, MD;  Location: [facility];  Service: Cardiovascular;  Laterality: N/A;   svd     x 2   tab     x 1   TUBAL LIGATION      OB History     Gravida  5   Para  3   Term  0   Preterm  3   AB  2   Living  3      SAB  2   IAB  0   Ectopic  0   Multiple  0   Live Births               Home Medications    Prior to Admission medications  Medication Sig Start Date End Date Taking? Authorizing Provider  Accu-Chek Softclix Lancets lancets USE 1 AS DIRECTED FOR BLOOD GLUCOSE MONITORING    [provider]  albuterol  (VENTOLIN  HFA) 108 (90 Base) MCG/ACT inhaler Inhale 1-2 puffs into the lungs every 6 (six) hours as needed. 07/20/22   Corlis Burnard DEL, NP  aluminum-magnesium hydroxide 200-200 MG/5ML suspension Take 15 mLs by mouth every 6 (six) hours as needed for indigestion.    [provider]  aspirin  EC 81 MG tablet Take 81 mg by mouth daily.    [provider]  Aspirin -Calcium  Carbonate 81-777 MG TABS Take by mouth. 03/25/14   [provider]  atorvastatin  (LIPITOR) 20 MG tablet TAKE 1 TABLET(20 MG) BY MOUTH DAILY 01/11/24   Donette City A, FNP  AUM SAFETY PEN NEEDLE 31G X 4 MM MISC SMARTSIG:1 pen needle SUB-Q Every Evening 03/24/24   [provider]  Blood Glucose Monitoring Suppl ([employer] BLOOD GLUCOSE METER) w/Device KIT USE AS DIRECTED ONCE A DAILY TO CHECK BLOOD GLUCOSE    [provider]  carvedilol   (COREG ) 25 MG tablet Take 1 tablet (25 mg total) by mouth 2 (two) times daily with a meal. 05/13/24   Riddle, Suzann, NP  cetirizine (ZYRTEC) 10 MG tablet Take 10 mg by mouth daily. 08/21/23   [provider]  EMBECTA PEN NEEDLE NANO 2 GEN 32G X 4 MM MISC SMARTSIG:1 pen needle SUB-Q Every Evening 03/24/24   [provider]  empagliflozin  (JARDIANCE ) 25 MG TABS tablet Take 1 tablet (25 mg total) by mouth daily before breakfast. 01/25/23   Donette City LABOR, FNP  famotidine  (PEPCID ) 20 MG tablet Take 1 tablet (20 mg total) by mouth 2 (two) times daily. 02/20/23   [place], PA-C  famotidine -calcium  carbonate-magnesium hydroxide (PEPCID   COMPLETE) 10-800-165 MG chewable tablet Chew 1 tablet by mouth daily as needed.    [provider]  fluticasone  (FLONASE ) 50 MCG/ACT nasal spray Place 2 sprays into both nostrils daily. 08/21/23   [provider]  glucose blood test strip USE ONE DAILY TO CHECK BLOOD SUGAR    [provider]  insulin  glargine (LANTUS) 100 UNIT/ML injection Inject 10 Units into the skin at bedtime.    [provider]  LANTUS SOLOSTAR 100 UNIT/ML Solostar Pen Inject into the skin daily.    [provider]  latanoprost  (XALATAN ) 0.005 % ophthalmic solution Place 1 drop into both eyes at bedtime. 01/17/22   [provider]  meclizine  (ANTIVERT ) 25 MG tablet Take 1 tablet (25 mg total) by mouth 3 (three) times daily as needed for dizziness or nausea. 08/23/22   Viviann Pastor, MD  mexiletine (MEXITIL ) 150 MG capsule Take 2 capsules (300 mg total) by mouth 2 (two) times daily. 07/07/24   Donette Ellouise LABOR, FNP  oxybutynin  (DITROPAN ) 5 MG tablet Take 5 mg by mouth 2 (two) times daily.    [provider]  OZEMPIC , 2 MG/DOSE, 8 MG/3ML SOPN Inject 2 mg into the skin once a week. 03/24/24   [provider]  pantoprazole  (PROTONIX ) 40 MG tablet Take 1 tablet (40 mg total) by mouth daily. 02/20/23 04/11/24  Honora Ellouise, PA-C  potassium chloride  20 MEQ/15ML (10%) SOLN Take 45 mLs (60 mEq total) by mouth daily. 03/24/24 03/24/25  Donette Ellouise LABOR, FNP  potassium chloride  SA (KLOR-CON  M) 20 MEQ tablet Take 20 mEq by mouth 2 (two) times daily. 05/01/14   [provider]  sacubitril -valsartan  (ENTRESTO ) 49-51 MG Take 1 tablet by mouth 2 (two) times daily.    [provider]  spironolactone  (ALDACTONE ) 25 MG tablet Take 2 tablets (50 mg total) by mouth daily. 01/30/24   Donette Ellouise LABOR, FNP  torsemide  (DEMADEX ) 20 MG tablet Take 4 tablets (80 mg total) by mouth every morning AND 2 tablets (40 mg total) every evening. 03/04/24   Donette Ellouise LABOR, FNP    Family History Family History  Problem Relation Age of Onset   Diabetes Mother    Hypertension Mother    Hyperlipidemia Mother    Hypertension Father    Breast cancer Maternal Grandmother 1    Social History Social History[1]   Allergies   Penicillins   Review of Systems Review of Systems  Constitutional: Negative.   HENT:  Positive for congestion, sneezing and sore throat. Negative for dental problem, drooling, ear discharge, ear pain, facial swelling, hearing loss, mouth sores, nosebleeds, postnasal drip, rhinorrhea, sinus pressure, sinus pain, tinnitus, trouble swallowing and voice change.   Respiratory:  Positive for cough, shortness of breath and wheezing. Negative for apnea, choking, chest tightness and stridor.   Gastrointestinal: Negative.   Neurological:  Speech difficulty: for 3 days,.     Physical Exam Triage Vital Signs ED Triage Vitals  Encounter Vitals Group     BP 07/23/24 1050 (!) 105/56     Girls Systolic BP Percentile --      Girls Diastolic BP Percentile --      Boys Systolic BP Percentile --      Boys Diastolic BP Percentile --      Pulse Rate 07/23/24 1050 84     Resp 07/23/24 1050 20     Temp 07/23/24 1050 98.1 F (36.7 C)     Temp Source 07/23/24 1050 Oral  SpO2 --      Weight --      Height --       Head Circumference --      Peak Flow --      Pain Score 07/23/24 1053 4     Pain Loc --      Pain Education --      Exclude from Growth Chart --    No data found.  Updated Vital Signs BP (!) 105/56 (BP Location: Right Arm)   Pulse 84   Temp 98.1 F (36.7 C) (Oral)   Resp 20   Visual Acuity Right Eye Distance:   Left Eye Distance:   Bilateral Distance:    Right Eye Near:   Left Eye Near:    Bilateral Near:     Physical Exam Constitutional:      Appearance: Normal appearance.  HENT:     Right Ear: Tympanic membrane, ear canal and external ear normal.     Left Ear: Tympanic membrane, ear canal and external ear normal.     Nose: Congestion present.     Right Sinus: Maxillary sinus tenderness present.     Left Sinus: Maxillary sinus tenderness present.     Mouth/Throat:     Pharynx: No oropharyngeal exudate or posterior oropharyngeal erythema.  Cardiovascular:     Rate and Rhythm: Normal rate and regular rhythm.     Pulses: Normal pulses.     Heart sounds: Normal heart sounds.  Pulmonary:     Effort: Pulmonary effort is normal.     Breath sounds: Normal breath sounds.  Musculoskeletal:     Cervical back: Normal range of motion.  Lymphadenopathy:     Cervical: Cervical adenopathy present.  Neurological:     Mental Status: She is alert and oriented to person, place, and time. Mental status is at baseline.      UC Treatments / Results  Labs (all labs ordered are listed, but only abnormal results are displayed) Labs Reviewed  POCT INFLUENZA A/B  POC SOFIA SARS ANTIGEN FIA    EKG   Radiology No results found.  Procedures Procedures (including critical care time)  Medications Ordered in UC Medications - No data to display  Initial Impression / Assessment and Plan / UC Course  I have reviewed the triage vital signs and the nursing notes.  Pertinent labs & imaging results that were available during my care of the patient were reviewed by me and  considered in my medical decision making (see chart for details).  Viral illness  Patient is in no signs of distress nor toxic appearing.  Vital signs are stable.  Low suspicion for pneumonia, pneumothorax or bronchitis and therefore will defer imaging.  COVID and flu testing negative, discussed findings.  Etiology most likely viral, symptoms present for 3 days with known sick contacts, discussed this with patient, as she has already initiated antibiotics recommended continuing course as directed however did discuss that it will not influence symptoms, additionally prescribed prednisone  and guaifenesin  codeine , PDMP reviewed, low risk, advised to monitor blood sugar closely.May use additional over-the-counter medications as needed for supportive care.  May follow-up with urgent care as needed if symptoms persist or worsen.   Final Clinical Impressions(s) / UC Diagnoses   Final diagnoses:  Viral illness   Discharge Instructions   None    ED Prescriptions   None    PDMP not reviewed this encounter.     [1]  Social History Tobacco Use   Smoking status:  Never   Smokeless tobacco: Never  Vaping Use   Vaping status: Never Used  Substance Use Topics   Alcohol use: No   Drug use: No     Teresa Shelba SAUNDERS, NP 07/23/24 1146  "

## 2024-07-28 ENCOUNTER — Telehealth: Payer: Self-pay | Admitting: Family

## 2024-07-28 NOTE — Telephone Encounter (Signed)
 Called to confirm/remind patient of their appointment at [facility] on 07/29/24.   Appointment:   [] Confirmed  [x] Left mess   [] No answer/No voice mail  [] VM Full/unable to leave message  [] Phone not in service  Patient reminded to bring all medications and/or complete list.  Confirmed patient has transportation. Gave directions, instructed to utilize valet parking.

## 2024-07-28 NOTE — Progress Notes (Unsigned)
 "  ADVANCED HF CLINIC NOTE   Primary Care: Osa Geralds, NP Primary Cardiologist: Evalene Lunger, MD  Chief Complaint: shortness of breath   HPI:  Heather Norman is a 48 y/o female with a history of arthritis, DM, hyperlipidemia, HTN, morbid obesity and chronic diastolic heart failure.   Zio 10/21 12.5% PVCs Saw EP Marny) and suggested possible AAD (flecainide or propafenone) if developed symptoms or LV dysfunction.   Echo 02/10/21 EF 50-55% G1DD   Echo 01/26/22 EF 55-60% mild LVH and trivial MR.  Sleep study 07/24/22: AHI 35.4 Sats down to 75%.  Possible AF during study lasting 4.5 hours. Zio placed    Zio patch 1/24  - Sinus rhythm. No AF - 44 runs NSVT (longest 8 beats)  - 21.3% PVCs (3 morphologies 9.7%, 7.3%, 4.3%)  Saw Dr. Cindie in 3/24. felt not to be ablation candidate due to obesity. Recommended continue carvedilol  as EF stable  Started on CPAP in 2024. Wore CPAP for awhile but then got bronchitis.   Seen in ER 10/30/22 for CP. Hstrop, ECG and d-Dimer all normal. BNP elevated 47 -> 480 so torsemide  increased.  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   Seen in ED on 08/15/23 for volume overload. Got IV lasix . Increased metolazone  for 1 to 2 x/week. Unfortunately developed low BP and AKI (Scr 1.5 -> 2.5) and admitted 08/22/23. Given IVF.   Echo 1/25 EF 60-65% G1DD  RV mildly down.   Seen in Urgent Care 06/25 and diagnosed with pneumonia.   Seen in [facility] 06/25 where torsemide  was increased  to 80/60mg  due to being fluid up. Entresto  was decreased to 24/26mg  BID due to hypotension.   08/25 attended a work wellness clinic, A1C was 11.2%. On 8/25 was started on Lantus 10 units every night. Reports stable glucose levels.   She presents today for a HF f/u visit with a chief complaint of shortness of breath. Has associated fatigue, chest tightness, intermittent dizziness, fingers/ feet neuropathy. Has not  been wearing CPAP due to head congestion and episode of emesis X 1. Waiting on a new CPAP mask. Denies palpitations, pedal edema, abdominal distention. Missed her nephrology appointment a few weeks ago and says that she has to r/s it. Her and her household are getting over respiratory infections.   Has uterine fibroids with no current complications.    ROS: All systems negative except what is listed in HPI, PMH and Problem List   Past Medical History:  Diagnosis Date   (HFpEF) heart failure with preserved ejection fraction (HCC)    a. 04/2017 Echo: EF 55-60%, no rwma, Mild MR, mildly dil LA. Nl RV fxn; b. 06/2019 Echo: EF 60-65%, mod LVH. Gr2 DD. Mildly dil LA; c. 03/2023 Cath: Nl cors. mild PAH (PA 31/17 (26) w/ high output (CO/CI 8.4/3.5); d. 08/2023 Echo: EF 55-60%, no rwma, GrI DD, mildly reduced RV fxn, RVSP 21.6 mmHg. No significant valvular dzs.   Arthritis    kness/hands - no meds   Diabetes mellitus without complication (HCC)    DM (diabetes mellitus) (HCC) 05/11/2017   Dyspnea on exertion    Headache(784.0)    otc med prn   Heart murmur    History of cardiac cath    a. 03/2023 Cath: Nl cors.   Hyperlipidemia    no meds since pregnancy   Hypertension    Morbid obesity (HCC)    NSVT (  nonsustained ventricular tachycardia) (HCC)    a. 07/2022 Zio: 44 runs NSVT, longest 8 beats, fastest 182. 3 brief SVT runs.  21.3% PVC burden.   PVC (premature ventricular contraction)    a. 07/2022 Zio: 21.3 PVCs, 9.6% couplets, 3.5% triplets, 44 brief runs of NSVT.    Current Outpatient Medications  Medication Sig Dispense Refill   Accu-Chek Softclix Lancets lancets USE 1 AS DIRECTED FOR BLOOD GLUCOSE MONITORING     albuterol  (VENTOLIN  HFA) 108 (90 Base) MCG/ACT inhaler Inhale 1-2 puffs into the lungs every 6 (six) hours as needed. 18 g 0   aluminum-magnesium hydroxide 200-200 MG/5ML suspension Take 15 mLs by mouth every 6 (six) hours as needed for indigestion.     aspirin  EC 81 MG tablet Take  81 mg by mouth daily.     Aspirin -Calcium  Carbonate 81-777 MG TABS Take by mouth.     atorvastatin  (LIPITOR) 20 MG tablet TAKE 1 TABLET(20 MG) BY MOUTH DAILY 90 tablet 3   AUM SAFETY PEN NEEDLE 31G X 4 MM MISC SMARTSIG:1 pen needle SUB-Q Every Evening     Blood Glucose Monitoring Suppl ([employer] BLOOD GLUCOSE METER) w/Device KIT USE AS DIRECTED ONCE A DAILY TO CHECK BLOOD GLUCOSE     carvedilol  (COREG ) 25 MG tablet Take 1 tablet (25 mg total) by mouth 2 (two) times daily with a meal. 180 tablet 3   cetirizine (ZYRTEC) 10 MG tablet Take 10 mg by mouth daily.     EMBECTA PEN NEEDLE NANO 2 GEN 32G X 4 MM MISC SMARTSIG:1 pen needle SUB-Q Every Evening     empagliflozin  (JARDIANCE ) 25 MG TABS tablet Take 1 tablet (25 mg total) by mouth daily before breakfast. 30 tablet 6   famotidine  (PEPCID ) 20 MG tablet Take 1 tablet (20 mg total) by mouth 2 (two) times daily.     famotidine -calcium  carbonate-magnesium hydroxide (PEPCID  COMPLETE) 10-800-165 MG chewable tablet Chew 1 tablet by mouth daily as needed.     fluticasone  (FLONASE ) 50 MCG/ACT nasal spray Place 2 sprays into both nostrils daily.     glucose blood test strip USE ONE DAILY TO CHECK BLOOD SUGAR     guaiFENesin -codeine  100-10 MG/5ML syrup Take 5 mLs by mouth every 6 (six) hours as needed for cough. 120 mL 0   insulin  glargine (LANTUS) 100 UNIT/ML injection Inject 10 Units into the skin at bedtime.     LANTUS SOLOSTAR 100 UNIT/ML Solostar Pen Inject into the skin daily.     latanoprost  (XALATAN ) 0.005 % ophthalmic solution Place 1 drop into both eyes at bedtime.     meclizine  (ANTIVERT ) 25 MG tablet Take 1 tablet (25 mg total) by mouth 3 (three) times daily as needed for dizziness or nausea. 30 tablet 1   mexiletine (MEXITIL ) 150 MG capsule Take 2 capsules (300 mg total) by mouth 2 (two) times daily. 360 capsule 3   oxybutynin  (DITROPAN ) 5 MG tablet Take 5 mg by mouth 2 (two) times daily.     OZEMPIC , 2 MG/DOSE, 8 MG/3ML SOPN Inject 2 mg into the skin  once a week.     pantoprazole  (PROTONIX ) 40 MG tablet Take 1 tablet (40 mg total) by mouth daily. 90 tablet 3   potassium chloride  20 MEQ/15ML (10%) SOLN Take 45 mLs (60 mEq total) by mouth daily. 473 mL 6   potassium chloride  SA (KLOR-CON  M) 20 MEQ tablet Take 20 mEq by mouth 2 (two) times daily.     predniSONE  (STERAPRED UNI-PAK 21 TAB) 10 MG (21) TBPK  tablet Take by mouth daily. Take 6 tabs by mouth daily  for 1 days, then 5 tabs for 1 days, then 4 tabs for 1 days, then 3 tabs for 1 days, 2 tabs for 1 days, then 1 tab by mouth daily for 1 days 21 tablet 0   sacubitril -valsartan  (ENTRESTO ) 49-51 MG Take 1 tablet by mouth 2 (two) times daily.     spironolactone  (ALDACTONE ) 25 MG tablet Take 2 tablets (50 mg total) by mouth daily.     torsemide  (DEMADEX ) 20 MG tablet Take 4 tablets (80 mg total) by mouth every morning AND 2 tablets (40 mg total) every evening.     No current facility-administered medications for this visit.    Allergies  Allergen Reactions   Penicillins Anaphylaxis    Has patient had a PCN reaction causing immediate rash, facial/tongue/throat swelling, SOB or lightheadedness with hypotension: Yes Has patient had a PCN reaction causing severe rash involving mucus membranes or skin necrosis: No Has patient had a PCN reaction that required hospitalization: No Has patient had a PCN reaction occurring within the last 10 years: No If all of the above answers are NO, then may proceed with Cephalosporin use.      Social History   Socioeconomic History   Marital status: Single    Spouse name: Not on file   Number of children: 3   Years of education: 12   Highest education level: 12th grade  Occupational History   Not on file  Tobacco Use   Smoking status: Never   Smokeless tobacco: Never  Vaping Use   Vaping status: Never Used  Substance and Sexual Activity   Alcohol use: No   Drug use: No   Sexual activity: Yes    Birth control/protection: Surgical  Other Topics  Concern   Not on file  Social History Narrative   Not on file   Social Drivers of Health   Tobacco Use: Low Risk (07/23/2024)   Patient History    Smoking Tobacco Use: Never    Smokeless Tobacco Use: Never    Passive Exposure: Not on file  Financial Resource Strain: Not on file  Food Insecurity: Food Insecurity Present (08/22/2023)   Hunger Vital Sign    Worried About Running Out of Food in the Last Year: Often true    Ran Out of Food in the Last Year: Sometimes true  Transportation Needs: No Transportation Needs (08/22/2023)   PRAPARE - Administrator, Civil Service (Medical): No    Lack of Transportation (Non-Medical): No  Physical Activity: Not on file  Stress: Not on file  Social Connections: Not on file  Intimate Partner Violence: Not At Risk (08/22/2023)   Humiliation, Afraid, Rape, and Kick questionnaire    Fear of Current or Ex-Partner: No    Emotionally Abused: No    Physically Abused: No    Sexually Abused: No  Depression (PHQ2-9): Medium Risk (01/21/2024)   Depression (PHQ2-9)    PHQ-2 Score: 8  Alcohol Screen: Not on file  Housing: Unknown (07/01/2024)   Received from [facility]   Epic    Unable to Pay for Housing in the Last Year: Not on file    Number of Times Moved in the Last Year: Not on file    At any time in the past 12 months, were you homeless or living in a shelter (including now)?: No  Utilities: Not At Risk (08/22/2023)   [facility] Utilities    Threatened with  loss of utilities: No  Health Literacy: Not on file      Family History  Problem Relation Age of Onset   Diabetes Mother    Hypertension Mother    Hyperlipidemia Mother    Hypertension Father    Breast cancer Maternal Grandmother 27   Vitals:   07/29/24 0938  BP: (!) 118/56  Pulse: (!) 50  SpO2: 97%  Weight: 291 lb 4 oz (132.1 kg)   Wt Readings from Last 3 Encounters:  07/29/24 291 lb 4 oz (132.1 kg)  04/11/24 296 lb 12.8 oz (134.6 kg)  04/08/24 300 lb  (136.1 kg)   Lab Results  Component Value Date   CREATININE 1.96 (H) 04/08/2024   CREATININE 1.90 (H) 03/24/2024   CREATININE 2.11 (H) 03/03/2024   PHYSICAL EXAM:  General: Well appearing.  Cor: No JVD. Regular rhythm, bradycardic.  Lungs: clear Abdomen: soft, nontender, nondistended. Extremities: no edema Neuro:. Affect pleasant   ASSESSMENT & PLAN:   1. 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   2. HTN - BP 118/56 - BMET 04/08/24 reviewed: sodium 137, potassium 3.6, creatinine 1.96 & GFR 31 - BMET today - Saw nephrology (Kolluru) 05/25, appointment 06/2024 was missed & she has to r/s this   3. PVC's, frequent - Zio 10/21 12.5% PVCs Saw EP Marny) and suggested possible AAD (flecainide or propafenone) if developed symptoms or LV dysfunction.  - Zio patch 1/24. Sinus No AF. 44 runs NSVT (longest 8 beats). 21.3% PVCs (3 morphologies 9.7%, 7.3%, 4.3%) - suspect driven by OSA but could aslo be infiltrative process (? Sarcoid) -> cMRI has been denied even after appeal - Saw Dr. Cindie 3/24 felt not to be ablation candidate due to obesity. Recommended continue carvedilol  as EF stable - PVCs not improved with CPAP - Continue mexilitene 300 bid   4. Possible AF  - Noted on sleep study and AppleWatch - No AF on Zio -> suspect this is likely PVCs - Saw EP (Riddle) 05/25  5. OSA, severe - Sleep study  07/24/22: AHI 35.4 Sats down to 75%.  - has not been wearing CPAP as she's waiting on a new mask to be delivered - saw pulmonology (Kasa) 09/25  6. Morbid obesity - Continue ozemipic 2 mg weekly  - Completed pulmonary rehab in July   Return in 3 months, sooner if needed.   I spent 30 minutes reviewing records, interviewing/ examing patient and managing plan/ orders.   Ellouise DELENA Class FNP-C 07/28/2024  "

## 2024-07-29 ENCOUNTER — Encounter: Payer: Self-pay | Admitting: Family

## 2024-07-29 ENCOUNTER — Ambulatory Visit: Attending: Family | Admitting: Family

## 2024-07-29 VITALS — BP 118/56 | HR 50 | Wt 291.2 lb

## 2024-07-29 DIAGNOSIS — I10 Essential (primary) hypertension: Secondary | ICD-10-CM | POA: Diagnosis not present

## 2024-07-29 DIAGNOSIS — Z7982 Long term (current) use of aspirin: Secondary | ICD-10-CM | POA: Diagnosis not present

## 2024-07-29 DIAGNOSIS — D259 Leiomyoma of uterus, unspecified: Secondary | ICD-10-CM | POA: Diagnosis not present

## 2024-07-29 DIAGNOSIS — Z79899 Other long term (current) drug therapy: Secondary | ICD-10-CM | POA: Insufficient documentation

## 2024-07-29 DIAGNOSIS — E119 Type 2 diabetes mellitus without complications: Secondary | ICD-10-CM | POA: Diagnosis not present

## 2024-07-29 DIAGNOSIS — G4733 Obstructive sleep apnea (adult) (pediatric): Secondary | ICD-10-CM | POA: Insufficient documentation

## 2024-07-29 DIAGNOSIS — Z833 Family history of diabetes mellitus: Secondary | ICD-10-CM | POA: Diagnosis not present

## 2024-07-29 DIAGNOSIS — Z8249 Family history of ischemic heart disease and other diseases of the circulatory system: Secondary | ICD-10-CM | POA: Insufficient documentation

## 2024-07-29 DIAGNOSIS — I110 Hypertensive heart disease with heart failure: Secondary | ICD-10-CM | POA: Insufficient documentation

## 2024-07-29 DIAGNOSIS — E785 Hyperlipidemia, unspecified: Secondary | ICD-10-CM | POA: Diagnosis not present

## 2024-07-29 DIAGNOSIS — Z83438 Family history of other disorder of lipoprotein metabolism and other lipidemia: Secondary | ICD-10-CM | POA: Insufficient documentation

## 2024-07-29 DIAGNOSIS — Z7985 Long-term (current) use of injectable non-insulin antidiabetic drugs: Secondary | ICD-10-CM | POA: Diagnosis not present

## 2024-07-29 DIAGNOSIS — Z794 Long term (current) use of insulin: Secondary | ICD-10-CM | POA: Diagnosis not present

## 2024-07-29 DIAGNOSIS — Z7952 Long term (current) use of systemic steroids: Secondary | ICD-10-CM | POA: Insufficient documentation

## 2024-07-29 DIAGNOSIS — I493 Ventricular premature depolarization: Secondary | ICD-10-CM | POA: Diagnosis not present

## 2024-07-29 DIAGNOSIS — I4891 Unspecified atrial fibrillation: Secondary | ICD-10-CM | POA: Diagnosis not present

## 2024-07-29 DIAGNOSIS — Z7984 Long term (current) use of oral hypoglycemic drugs: Secondary | ICD-10-CM | POA: Diagnosis not present

## 2024-07-29 DIAGNOSIS — I5032 Chronic diastolic (congestive) heart failure: Secondary | ICD-10-CM | POA: Diagnosis not present

## 2024-07-29 LAB — BASIC METABOLIC PANEL WITH GFR
BUN/Creatinine Ratio: 18 (ref 9–23)
BUN: 35 mg/dL — ABNORMAL HIGH (ref 6–24)
CO2: 21 mmol/L (ref 20–29)
Calcium: 9.5 mg/dL (ref 8.7–10.2)
Chloride: 98 mmol/L (ref 96–106)
Creatinine, Ser: 1.93 mg/dL — ABNORMAL HIGH (ref 0.57–1.00)
Glucose: 216 mg/dL — ABNORMAL HIGH (ref 70–99)
Potassium: 4.4 mmol/L (ref 3.5–5.2)
Sodium: 138 mmol/L (ref 134–144)
eGFR: 31 mL/min/1.73 — ABNORMAL LOW

## 2024-07-29 NOTE — Patient Instructions (Signed)
 It was great to see you today!  Labs done today, your results will be available in MyChart, we will contact you for abnormal readings.    Follow Up: 3 months with Ellouise Class, NP

## 2024-07-30 ENCOUNTER — Ambulatory Visit: Payer: Self-pay | Admitting: Family

## 2024-09-01 ENCOUNTER — Emergency Department: Payer: Worker's Compensation

## 2024-09-01 ENCOUNTER — Emergency Department
Admission: EM | Admit: 2024-09-01 | Discharge: 2024-09-01 | Disposition: A | Discharge status: Home / Self Care | Payer: Worker's Compensation | Attending: Emergency Medicine | Admitting: Emergency Medicine

## 2024-09-01 ENCOUNTER — Other Ambulatory Visit: Payer: Self-pay

## 2024-09-01 DIAGNOSIS — M25511 Pain in right shoulder: Secondary | ICD-10-CM | POA: Insufficient documentation

## 2024-09-01 DIAGNOSIS — M79672 Pain in left foot: Secondary | ICD-10-CM

## 2024-09-01 DIAGNOSIS — I5032 Chronic diastolic (congestive) heart failure: Secondary | ICD-10-CM | POA: Insufficient documentation

## 2024-09-01 DIAGNOSIS — M25572 Pain in left ankle and joints of left foot: Secondary | ICD-10-CM | POA: Insufficient documentation

## 2024-09-01 DIAGNOSIS — Y990 Civilian activity done for income or pay: Secondary | ICD-10-CM | POA: Insufficient documentation

## 2024-09-01 DIAGNOSIS — I130 Hypertensive heart and chronic kidney disease with heart failure and stage 1 through stage 4 chronic kidney disease, or unspecified chronic kidney disease: Secondary | ICD-10-CM | POA: Insufficient documentation

## 2024-09-01 DIAGNOSIS — N1832 Chronic kidney disease, stage 3b: Secondary | ICD-10-CM | POA: Insufficient documentation

## 2024-09-01 DIAGNOSIS — E1122 Type 2 diabetes mellitus with diabetic chronic kidney disease: Secondary | ICD-10-CM | POA: Insufficient documentation

## 2024-09-01 DIAGNOSIS — W000XXA Fall on same level due to ice and snow, initial encounter: Secondary | ICD-10-CM | POA: Insufficient documentation

## 2024-09-01 DIAGNOSIS — W19XXXA Unspecified fall, initial encounter: Secondary | ICD-10-CM

## 2024-09-01 NOTE — ED Triage Notes (Signed)
 Slipped and fell at work on snow on Saturday.  Left foot turned out, c/o pain to left ankle.  Also c/o pain to right upper arm, pain to both legs and back.  Ambulatory AAOx3 skin warm and dry. NAD

## 2024-10-28 ENCOUNTER — Ambulatory Visit: Admitting: Family
